# Patient Record
Sex: Male | Born: 1955 | Race: White | Hispanic: No | Marital: Single | Smoking: Never smoker
Health system: Southern US, Community
[De-identification: ages and names within clinical notes are randomized; demographics above are authoritative.]

## PROBLEM LIST (undated history)

## (undated) DIAGNOSIS — T148XXA Other injury of unspecified body region, initial encounter: Secondary | ICD-10-CM

## (undated) DIAGNOSIS — C439 Malignant melanoma of skin, unspecified: Secondary | ICD-10-CM

---

## 2006-12-18 ENCOUNTER — Emergency Department (HOSPITAL_COMMUNITY): Admission: EM | Admit: 2006-12-18 | Discharge: 2006-12-18 | Payer: Self-pay | Admitting: Emergency Medicine

## 2019-02-26 ENCOUNTER — Other Ambulatory Visit: Payer: Self-pay | Admitting: General Surgery

## 2019-02-26 DIAGNOSIS — R222 Localized swelling, mass and lump, trunk: Secondary | ICD-10-CM

## 2019-03-02 ENCOUNTER — Other Ambulatory Visit: Payer: Self-pay | Admitting: General Surgery

## 2019-03-02 DIAGNOSIS — C4359 Malignant melanoma of other part of trunk: Secondary | ICD-10-CM

## 2019-03-03 ENCOUNTER — Other Ambulatory Visit: Payer: Self-pay | Admitting: General Surgery

## 2019-03-03 ENCOUNTER — Telehealth: Payer: Self-pay | Admitting: *Deleted

## 2019-03-03 ENCOUNTER — Ambulatory Visit
Admission: RE | Admit: 2019-03-03 | Discharge: 2019-03-03 | Disposition: A | Discharge status: Home / Self Care | Payer: Medicare Other | Source: Ambulatory Visit | Attending: General Surgery | Admitting: General Surgery

## 2019-03-03 DIAGNOSIS — C4359 Malignant melanoma of other part of trunk: Secondary | ICD-10-CM

## 2019-03-03 DIAGNOSIS — R222 Localized swelling, mass and lump, trunk: Secondary | ICD-10-CM

## 2019-03-03 IMAGING — CT CT NECK WITH CONTRAST
1 series · 1 of 1 positions shown · IV contrast (iopamidol)
Comparison: CT chest abdomen and pelvis reported separately.

CLINICAL DATA: Melanoma. Bleeding from biopsy site.

EXAM:
CT NECK WITH CONTRAST
TECHNIQUE: Multidetector CT imaging of the neck was performed using the
standard protocol following the bolus administration of intravenous
contrast.
CONTRAST:  125mL [KD] IOPAMIDOL ([KD]) INJECTION 61%

[Series 3: topogram 0.70 tr20 sag · sagittal · 1.00mm/px · 1 of 1 slices shown]
[im 1/1]
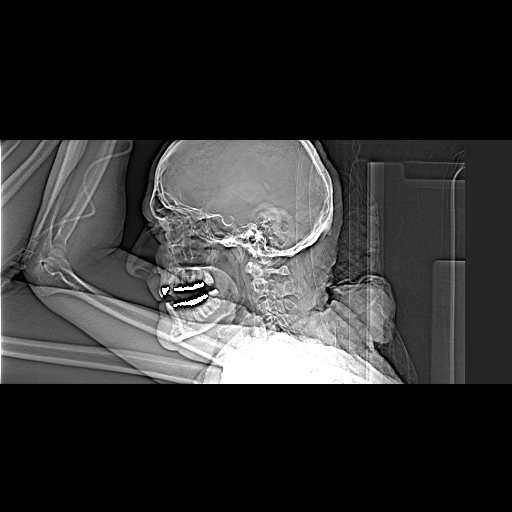

[1 of 1 positions shown; findings below may reference images not displayed]

FINDINGS: Pharynx and larynx: Normal. No mass or swelling.

Salivary glands: No inflammation, mass, or stone.

Thyroid: Normal.

Lymph nodes: None enlarged or abnormal density.

Vascular: Negative.

Limited intracranial: No visible intracranial metastatic deposits.

Visualized orbits: Negative.

Mastoids and visualized paranasal sinuses: Clear.

Skeleton: No acute or aggressive process.

Upper chest: Negative.

Other: There is a 96 x 48 x 57 mm (R-L x A-P x C-C) tumor with a
vascular pedicle, avidly enhancing, arising from the skin, midline
posterior neck, consistent with the stated diagnosis of melanoma.
IMPRESSION: 1. 96 x 48 x 57 mm tumor arising from the skin, midline posterior
neck consistent with the stated diagnosis of melanoma.
2. No visible regional adenopathy or intracranial metastatic
disease.

## 2019-03-03 IMAGING — CT CT NECK WITH CONTRAST
4 series · 16 of 33 positions shown, 18 images · IV contrast (iopamidol)
Comparison: CT chest abdomen and pelvis reported separately.

CLINICAL DATA: Melanoma. Bleeding from biopsy site.

EXAM:
CT NECK WITH CONTRAST
TECHNIQUE: Multidetector CT imaging of the neck was performed using the
standard protocol following the bolus administration of intravenous
contrast.
CONTRAST:  125mL [KD] IOPAMIDOL ([KD]) INJECTION 61%

[Series 6: neck 2.00 br40 s3 ax st · axial · 0.46mm/px · z∈[-617,-483]mm · 3 of 141 slices shown, 4 images]
[im 36/141  soft-tissue]
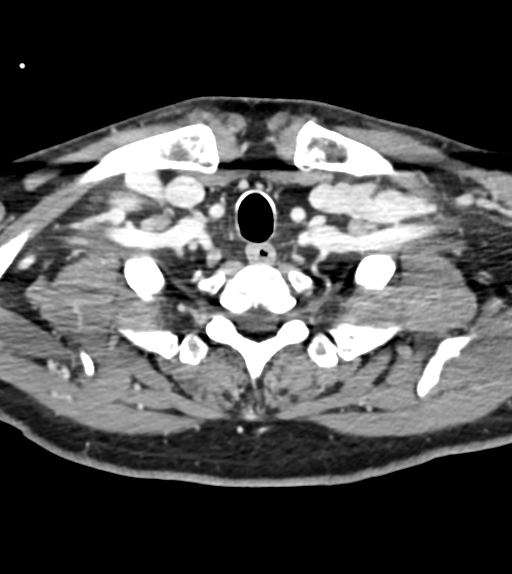
[im 36/141  bone]
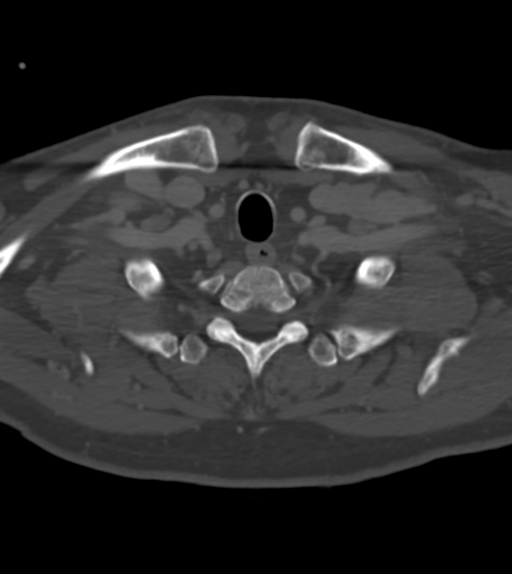
[im 71/141  bone]
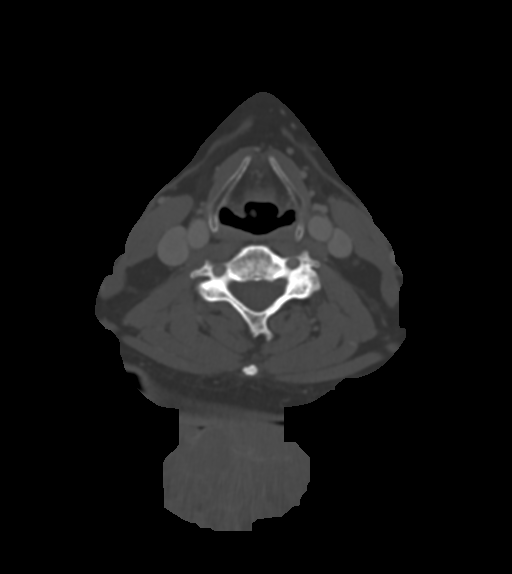
[im 106/141  bone]
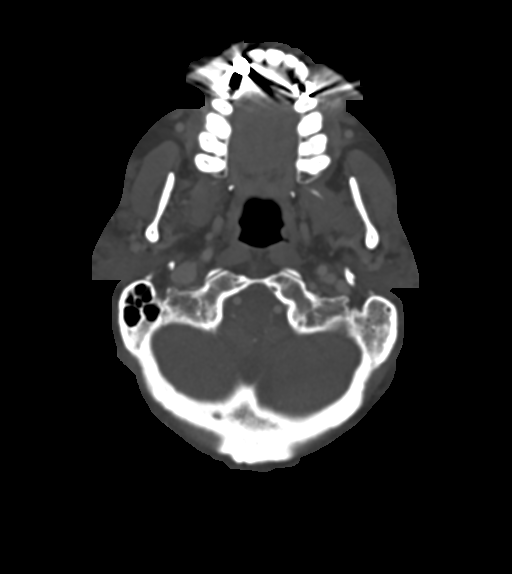

[Series 32: neck 2.00 br40 s3 cor st · coronal · 0.53mm/px · 3 of 131 slices shown]
[im 27/131  bone]
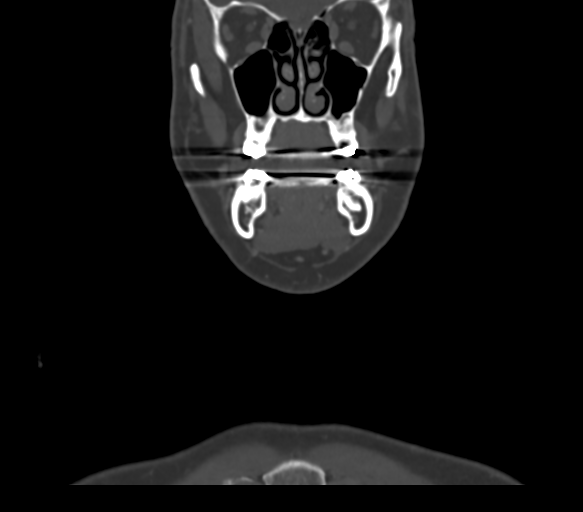
[im 53/131  bone]
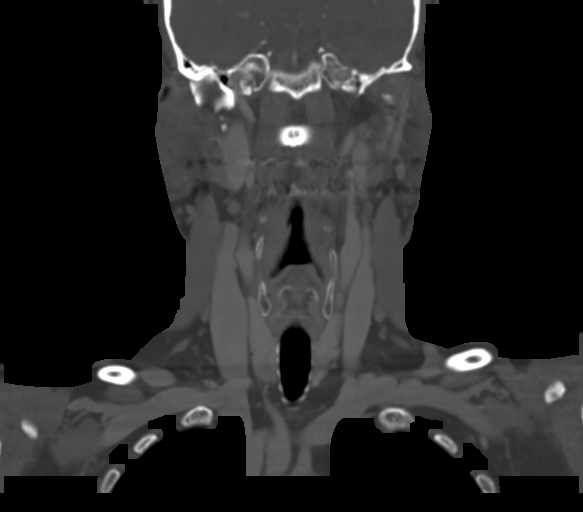
[im 79/131  bone]
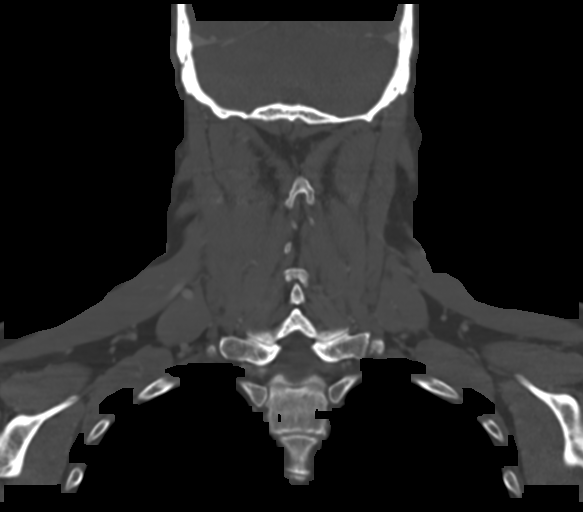

[Series 34: neck 2.00 br40 s3 sag st · sagittal · 0.51mm/px · 5 of 154 slices shown, 6 images]
[im 52/154  bone]
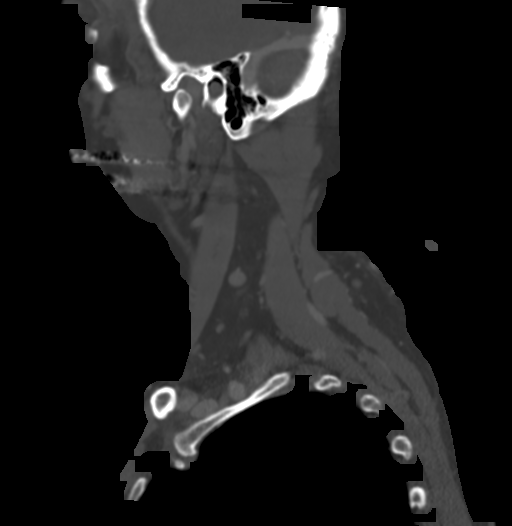
[im 64/154  bone]
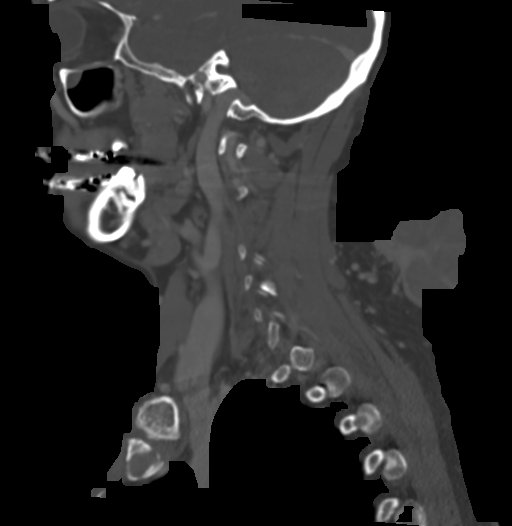
[im 77/154  soft-tissue]
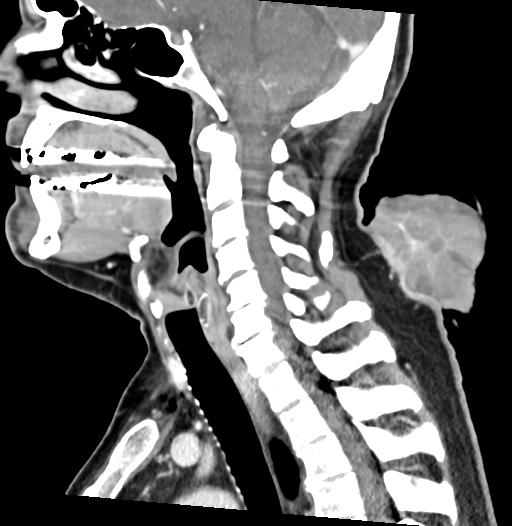
[im 77/154  bone]
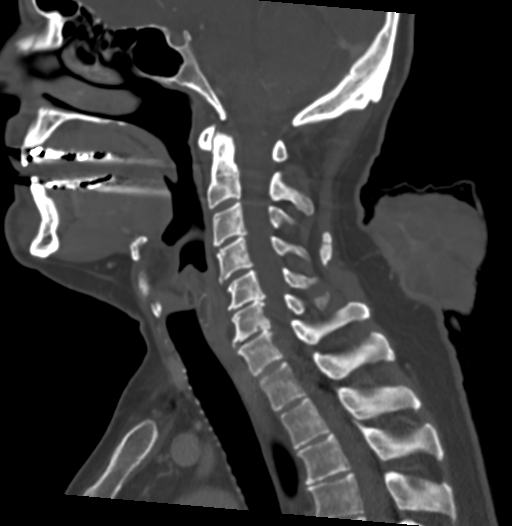
[im 90/154  bone]
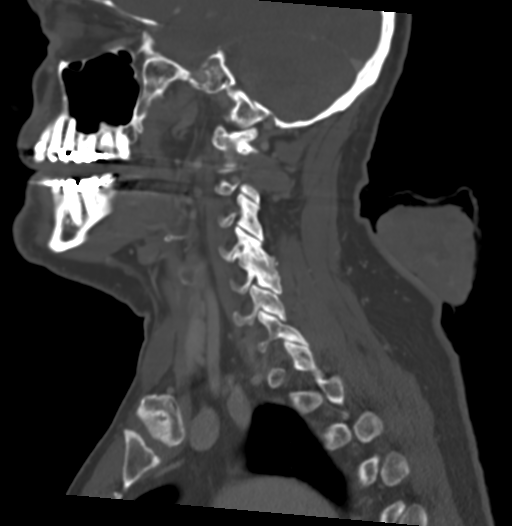
[im 103/154  bone]
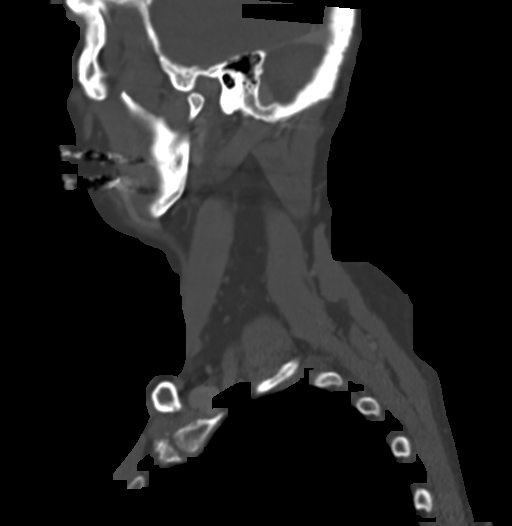

[Series 36: neck 2.00 br36 s3 (person_name) · axial · 0.53mm/px · z∈[-633,-509]mm · 5 of 254 slices shown]
[im 32/254  bone]
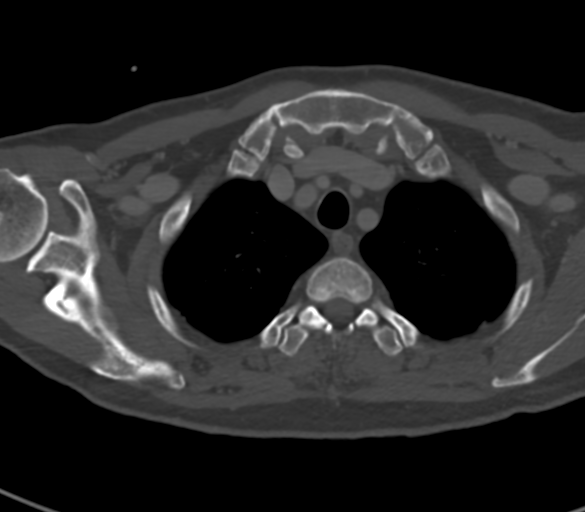
[im 64/254  bone]
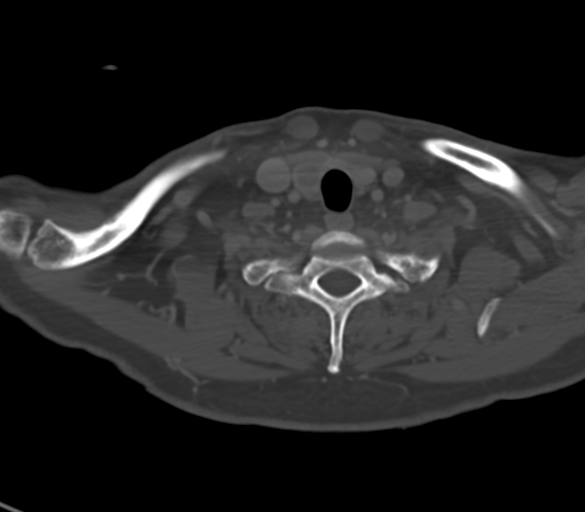
[im 95/254  bone]
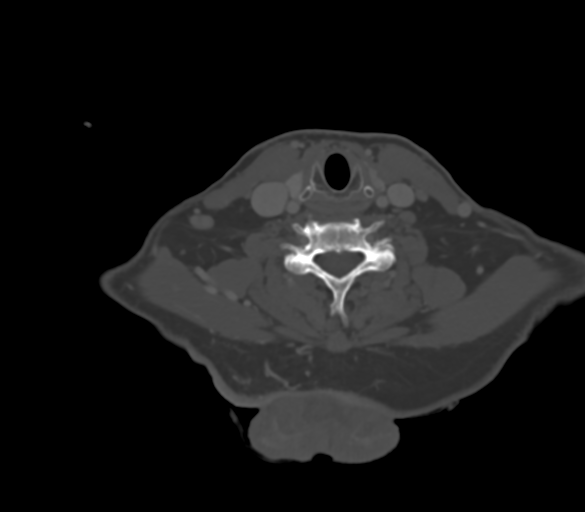
[im 127/254  bone]
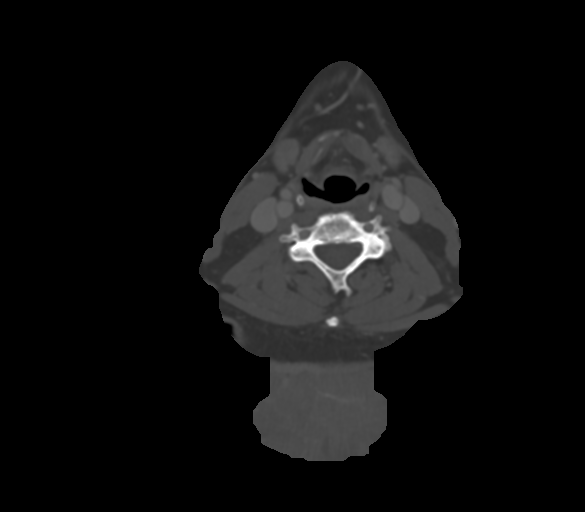
[im 159/254  bone]
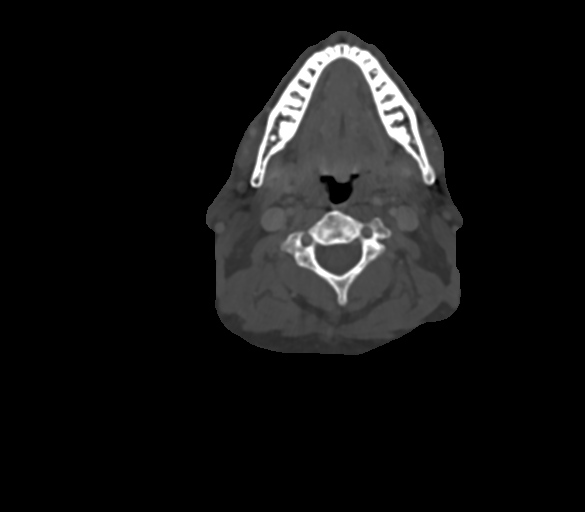

[16 of 33 positions shown; findings below may reference images not displayed]

FINDINGS: Pharynx and larynx: Normal. No mass or swelling.

Salivary glands: No inflammation, mass, or stone.

Thyroid: Normal.

Lymph nodes: None enlarged or abnormal density.

Vascular: Negative.

Limited intracranial: No visible intracranial metastatic deposits.

Visualized orbits: Negative.

Mastoids and visualized paranasal sinuses: Clear.

Skeleton: No acute or aggressive process.

Upper chest: Negative.

Other: There is a 96 x 48 x 57 mm (R-L x A-P x C-C) tumor with a
vascular pedicle, avidly enhancing, arising from the skin, midline
posterior neck, consistent with the stated diagnosis of melanoma.
IMPRESSION: 1. 96 x 48 x 57 mm tumor arising from the skin, midline posterior
neck consistent with the stated diagnosis of melanoma.
2. No visible regional adenopathy or intracranial metastatic
disease.

## 2019-03-03 IMAGING — CT CT NECK WITH CONTRAST
1 series · 12 of 14 positions shown, 15 images · IV contrast (iopamidol)
Comparison: CT chest abdomen and pelvis reported separately.

CLINICAL DATA: Melanoma. Bleeding from biopsy site.

EXAM:
CT NECK WITH CONTRAST
TECHNIQUE: Multidetector CT imaging of the neck was performed using the
standard protocol following the bolus administration of intravenous
contrast.
CONTRAST:  125mL [KD] IOPAMIDOL ([KD]) INJECTION 61%

[Series 4: neck 2.00 br60 s3 ax lung · axial · 0.48mm/px · z∈[-642,-580]mm · 12 of 37 slices shown, 15 images]
[im 3/37  soft-tissue]
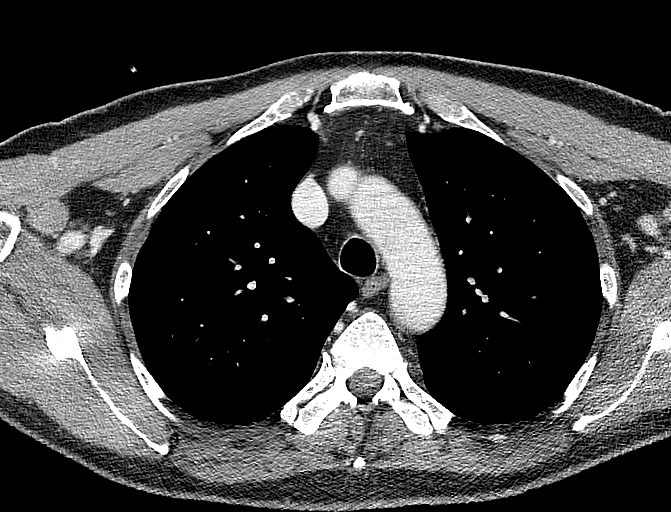
[im 3/37  bone]
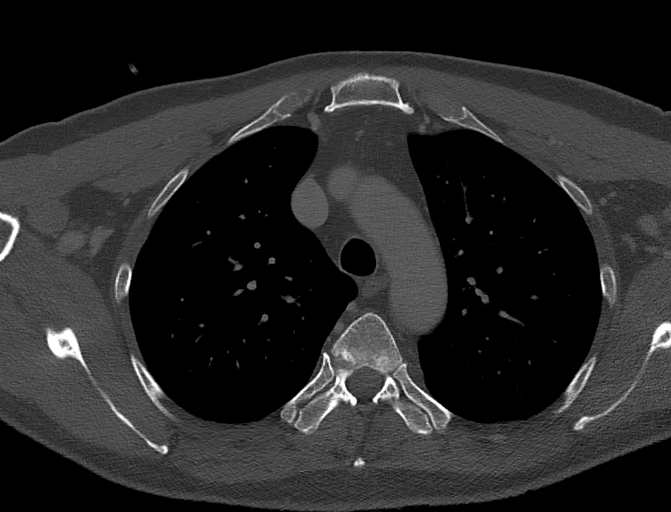
[im 6/37  bone]
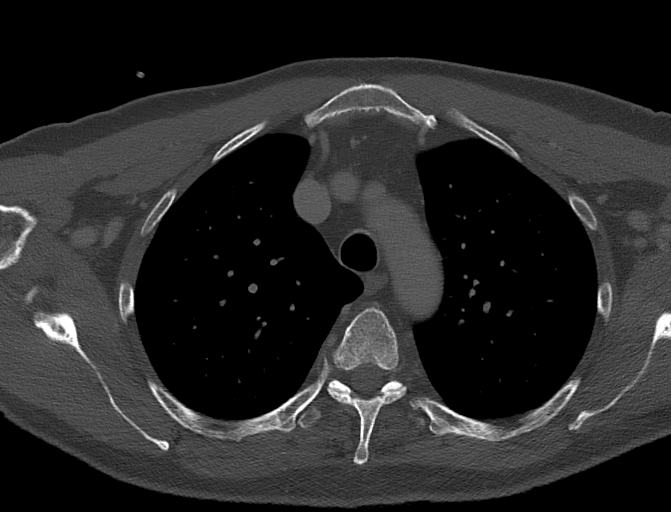
[im 9/37  bone]
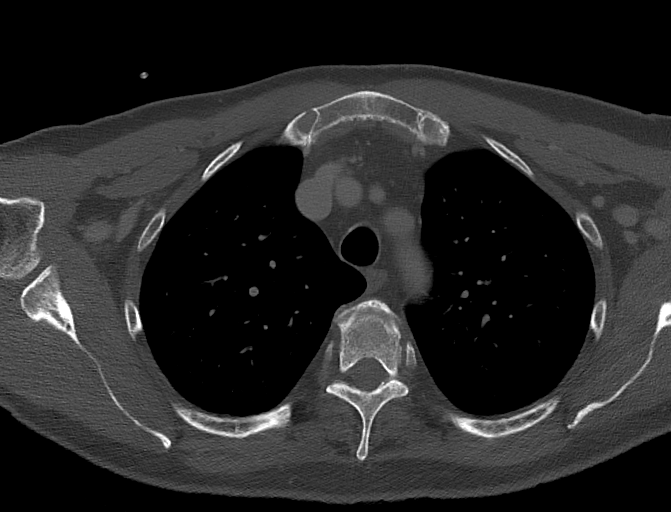
[im 12/37  bone]
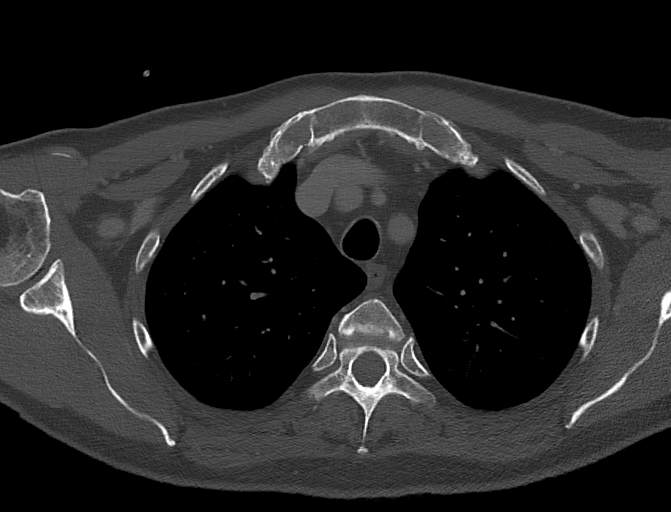
[im 14/37  soft-tissue]
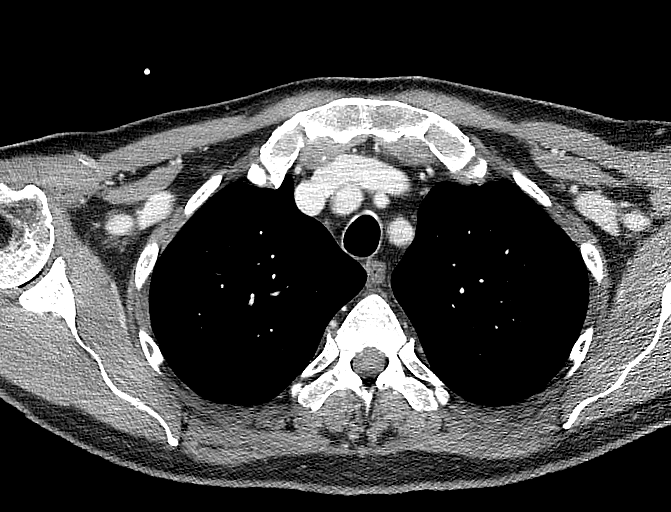
[im 14/37  bone]
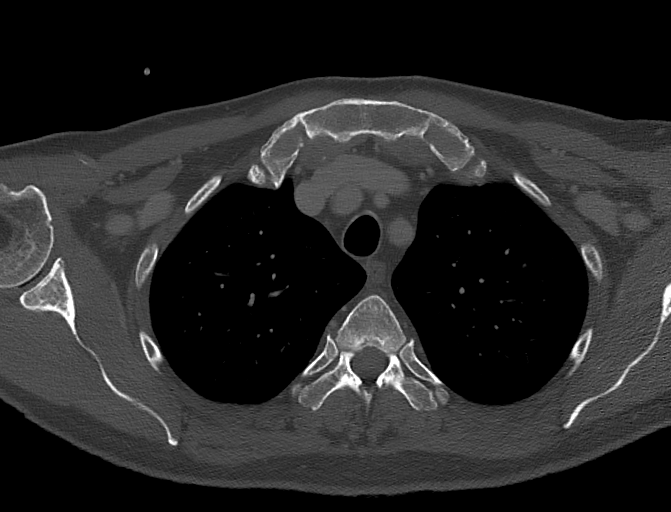
[im 17/37  bone]
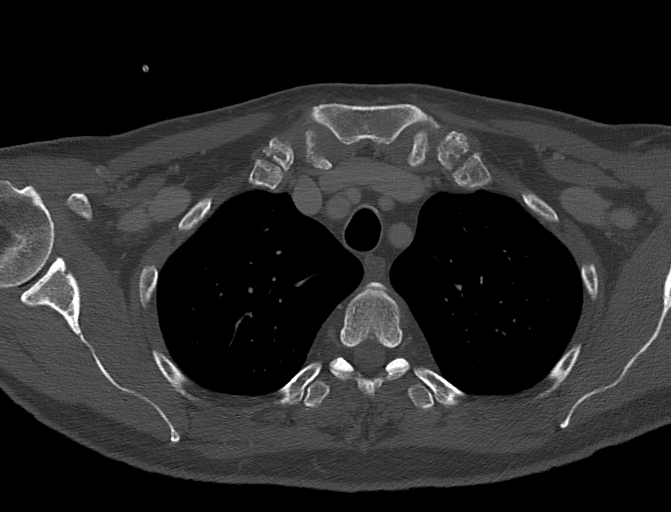
[im 20/37  bone]
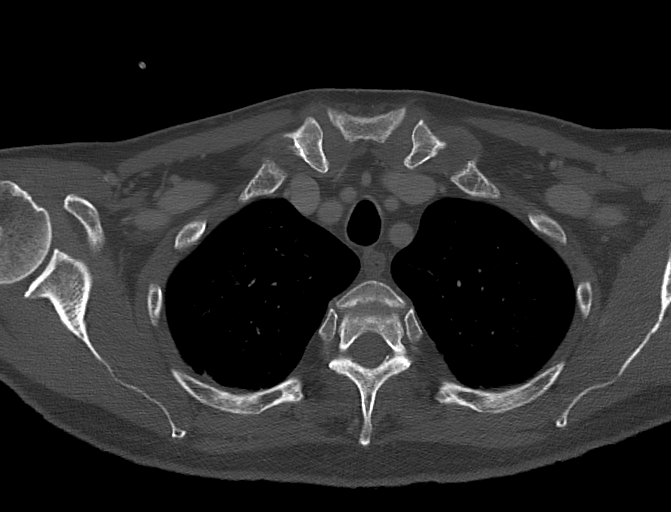
[im 23/37  bone]
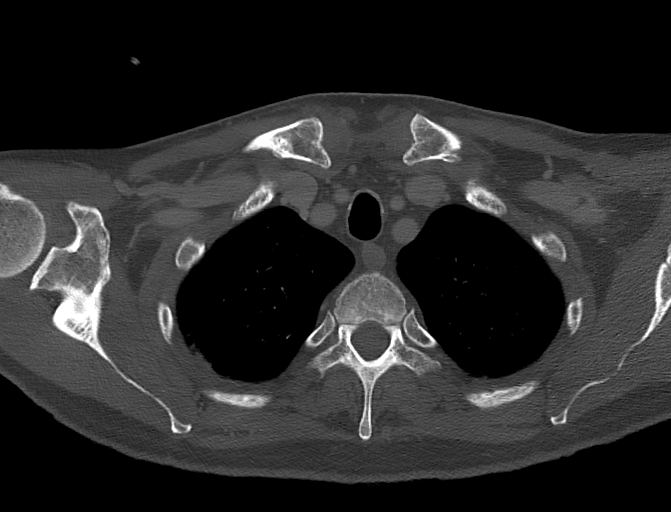
[im 25/37  soft-tissue]
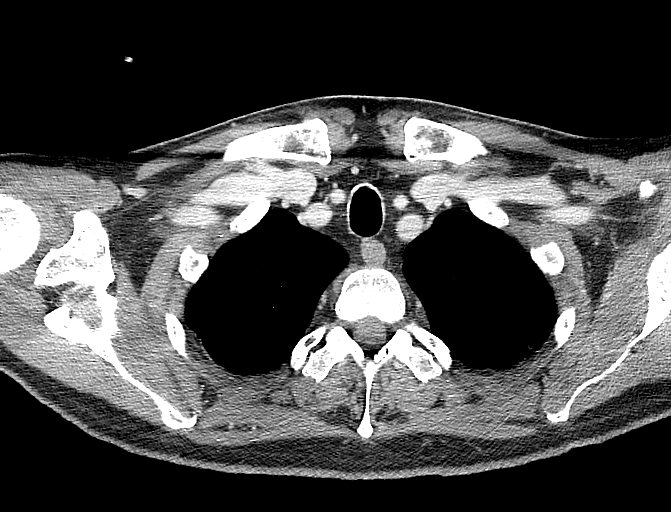
[im 25/37  bone]
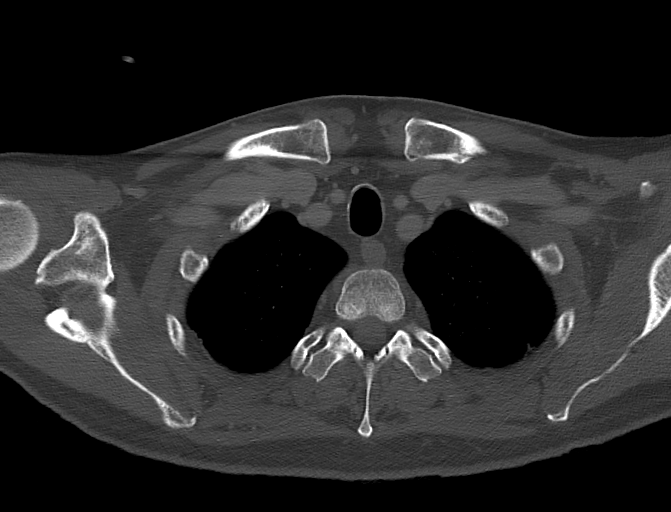
[im 28/37  bone]
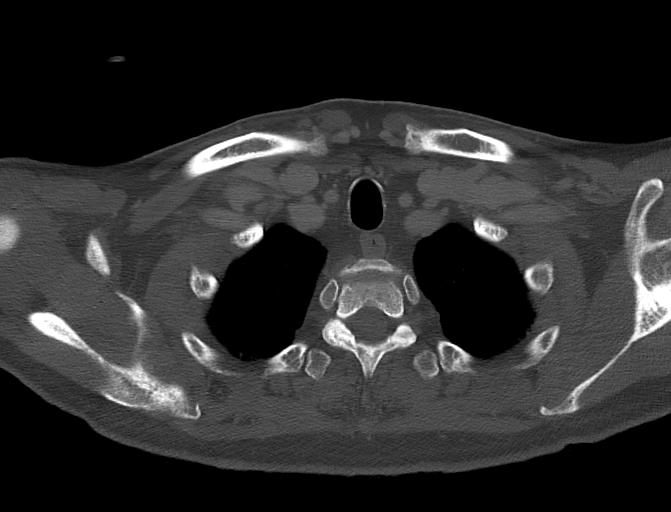
[im 31/37  bone]
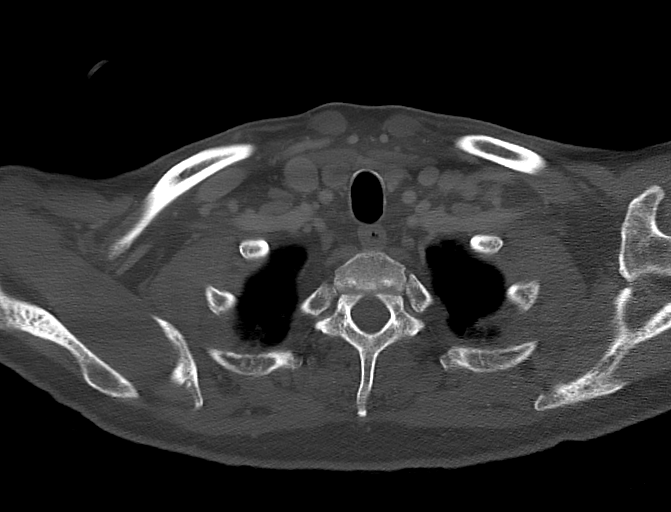
[im 34/37  bone]
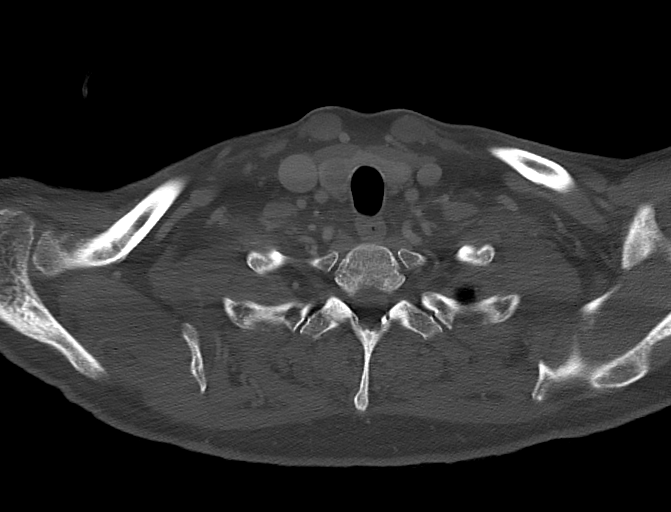

[12 of 14 positions shown; findings below may reference images not displayed]

FINDINGS: Pharynx and larynx: Normal. No mass or swelling.

Salivary glands: No inflammation, mass, or stone.

Thyroid: Normal.

Lymph nodes: None enlarged or abnormal density.

Vascular: Negative.

Limited intracranial: No visible intracranial metastatic deposits.

Visualized orbits: Negative.

Mastoids and visualized paranasal sinuses: Clear.

Skeleton: No acute or aggressive process.

Upper chest: Negative.

Other: There is a 96 x 48 x 57 mm (R-L x A-P x C-C) tumor with a
vascular pedicle, avidly enhancing, arising from the skin, midline
posterior neck, consistent with the stated diagnosis of melanoma.
IMPRESSION: 1. 96 x 48 x 57 mm tumor arising from the skin, midline posterior
neck consistent with the stated diagnosis of melanoma.
2. No visible regional adenopathy or intracranial metastatic
disease.

## 2019-03-03 IMAGING — CT CT ABDOMEN AND PELVIS WITHOUT AND WITH CONTRAST
2 of 8 series · 13 of 46 positions shown, 15 images · IV contrast (iopamidol)
Comparison: None.

CLINICAL DATA: Melanoma, back

EXAM:
CT CHEST, ABDOMEN, AND PELVIS WITH CONTRAST
TECHNIQUE: Multidetector CT imaging of the chest, abdomen and pelvis was
performed following the standard protocol during bolus
administration of intravenous contrast.
CONTRAST:  125mL [N4] IOPAMIDOL ([N4]) INJECTION 61%,
additional oral enteric contrast

[Series 8: arterial phase liver 2.00 br40 s3 cor · coronal · arterial · 0.45mm/px · 3 of 170 slices shown]
[im 43/170  soft-tissue]
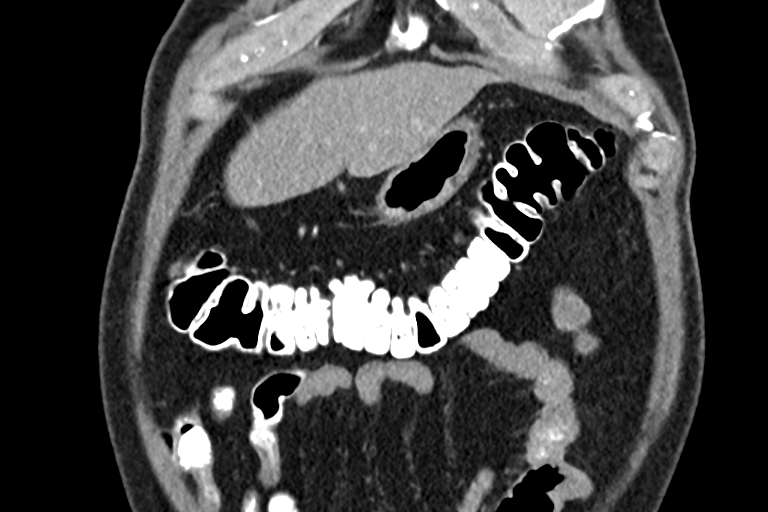
[im 85/170  soft-tissue]
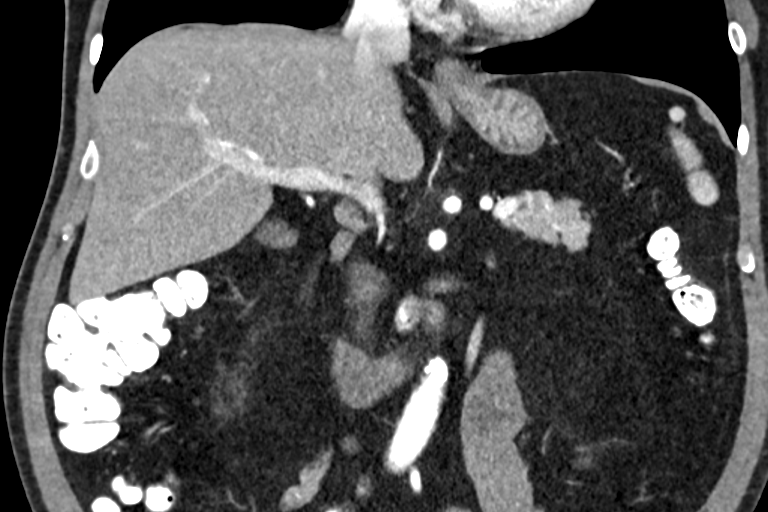
[im 127/170  soft-tissue]
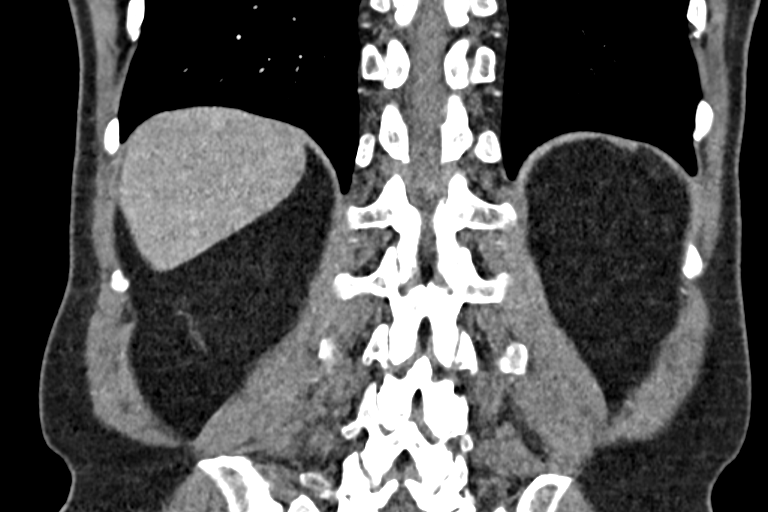

[Series 17: venous phase liver 2.00 br40 s3 ax · axial · portal-venous · 0.66mm/px · z∈[-1175,-587]mm · 10 of 330 slices shown, 12 images]
[im 18/330  soft-tissue]
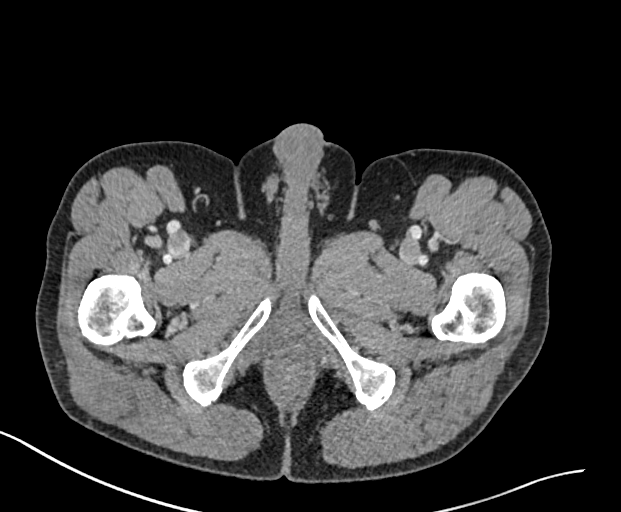
[im 18/330  bone]
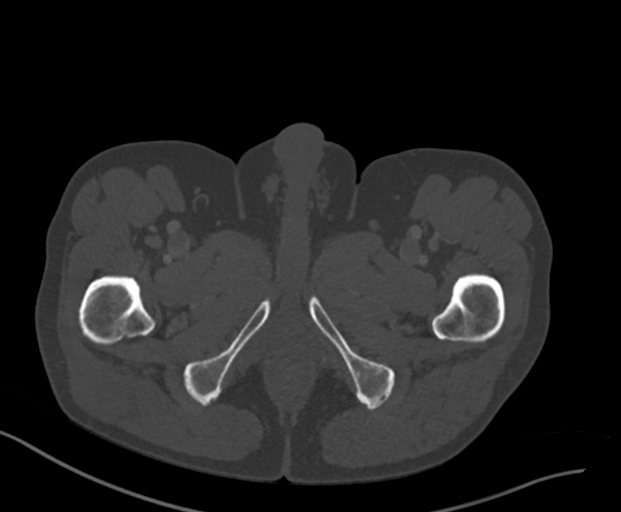
[im 52/330  soft-tissue]
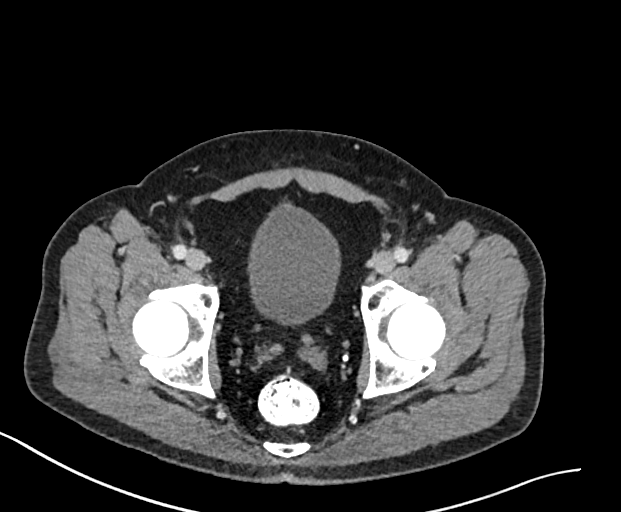
[im 87/330  soft-tissue]
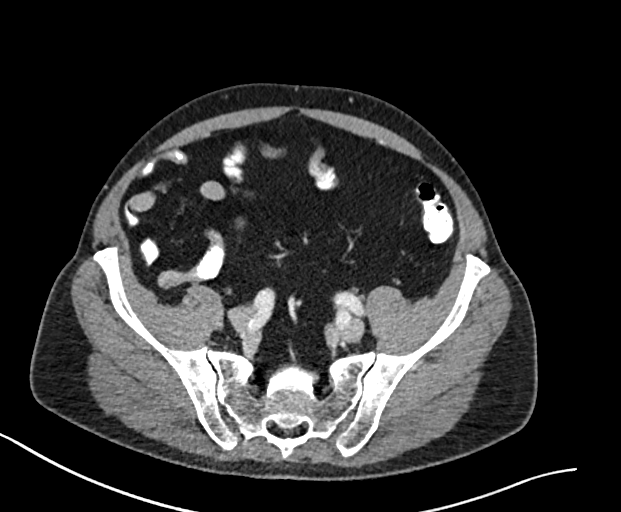
[im 122/330  soft-tissue]
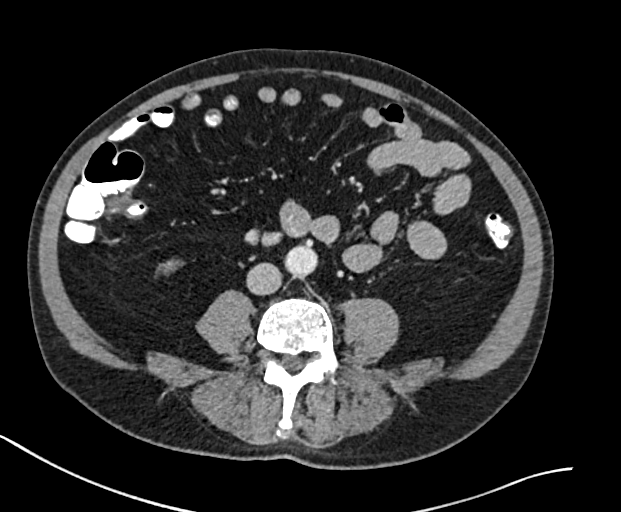
[im 156/330  soft-tissue]
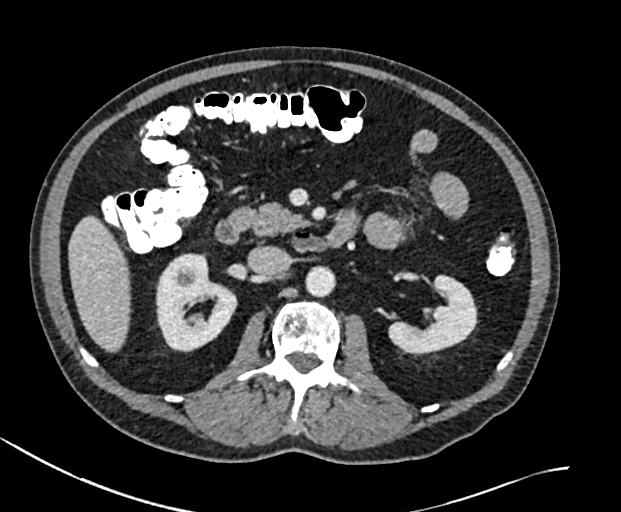
[im 174/330  soft-tissue]
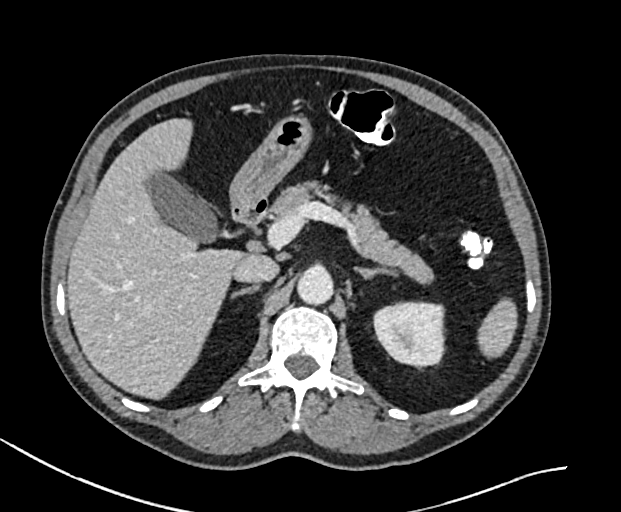
[im 208/330  soft-tissue]
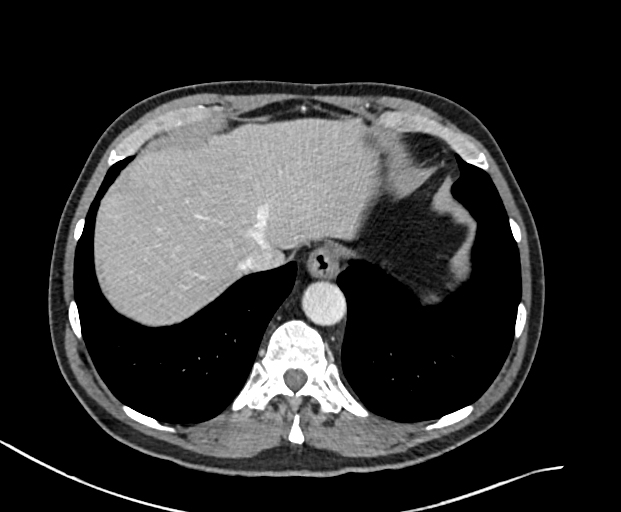
[im 243/330  soft-tissue]
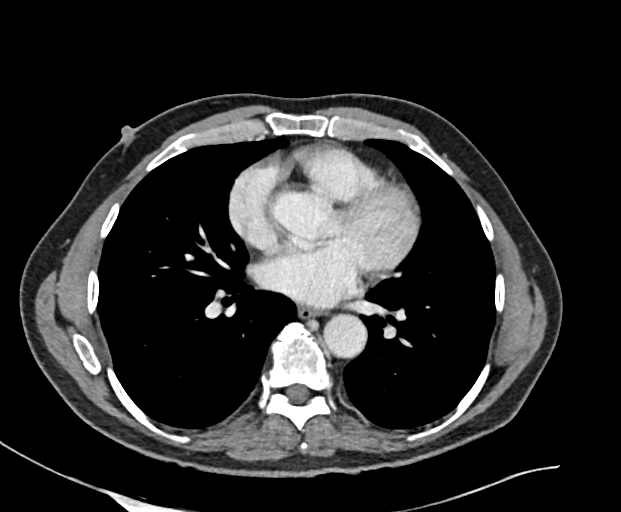
[im 278/330  soft-tissue]
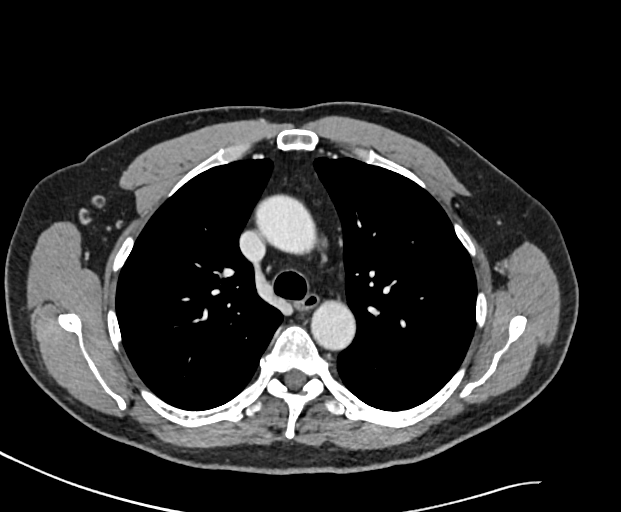
[im 278/330  bone]
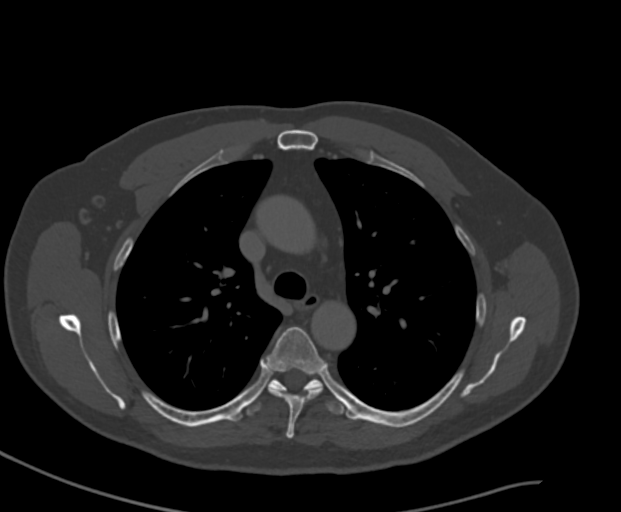
[im 312/330  soft-tissue]
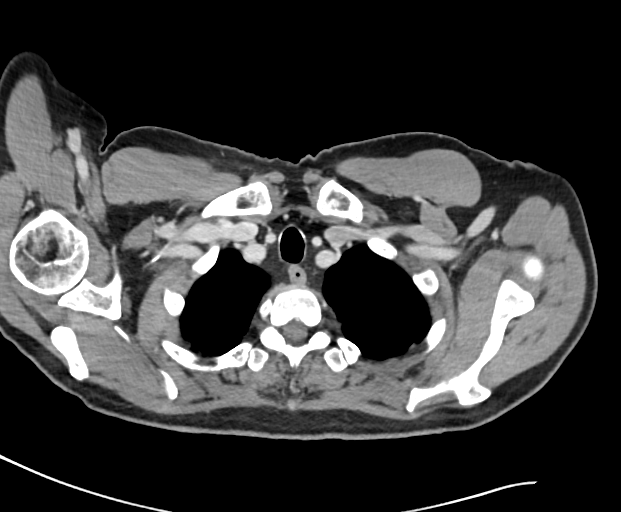

[13 of 46 positions shown; findings below may reference images not displayed]

FINDINGS: CT CHEST FINDINGS

Cardiovascular: No significant vascular findings. Normal heart size.
No pericardial effusion.

Mediastinum/Nodes: No enlarged mediastinal, hilar, or axillary lymph
nodes. Thyroid gland, trachea, and esophagus demonstrate no
significant findings.

Lungs/Pleura: Lungs are clear. No pleural effusion or pneumothorax.

Musculoskeletal: No chest wall mass or suspicious bone lesions
identified.

CT ABDOMEN PELVIS FINDINGS

Hepatobiliary: No focal liver abnormality is seen. No gallstones,
gallbladder wall thickening, or biliary dilatation.

Pancreas: Unremarkable. No pancreatic ductal dilatation or
surrounding inflammatory changes.

Spleen: Normal in size without focal abnormality.

Adrenals/Urinary Tract: Adrenal glands are unremarkable. Kidneys are
normal, without renal calculi, focal lesion, or hydronephrosis.
Bladder is unremarkable.

Stomach/Bowel: Stomach is within normal limits. Appendix appears
normal. No evidence of bowel wall thickening, distention, or
inflammatory changes.

Vascular/Lymphatic: No significant vascular findings are present. No
enlarged abdominal or pelvic lymph nodes.

Reproductive: No mass or other abnormality.

Other: No abdominal wall hernia or abnormality. No abdominopelvic
ascites.

Musculoskeletal: No acute or significant osseous findings.
IMPRESSION: No CT evidence of metastatic disease in the chest, abdomen, or
pelvis.

## 2019-03-03 IMAGING — CT CT ABDOMEN AND PELVIS WITHOUT AND WITH CONTRAST
2 series · 13 of 36 positions shown, 17 images · IV contrast (iopamidol)
Comparison: None.

CLINICAL DATA: Melanoma, back

EXAM:
CT CHEST, ABDOMEN, AND PELVIS WITH CONTRAST
TECHNIQUE: Multidetector CT imaging of the chest, abdomen and pelvis was
performed following the standard protocol during bolus
administration of intravenous contrast.
CONTRAST:  125mL [N4] IOPAMIDOL ([N4]) INJECTION 61%,
additional oral enteric contrast

[Series 4: venous phase liver 2.00 br40 s3 sag · sagittal · portal-venous · 0.66mm/px · 1 of 204 slices shown, 2 images]
[im 68/204  soft-tissue]
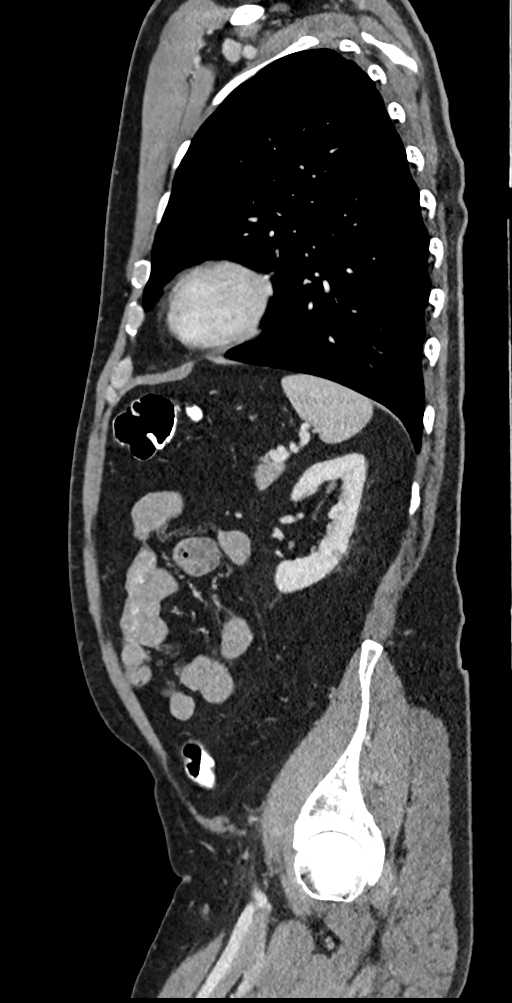
[im 68/204  bone]
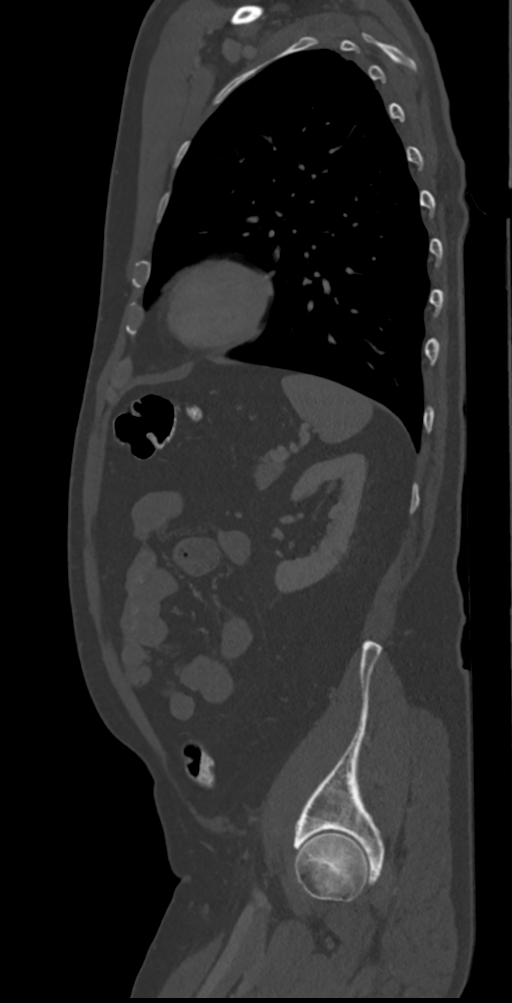

[Series 5: liver /kidney delay 2.00 br40 s3 ax delay · axial · delayed · 0.65mm/px · z∈[-956,-780]mm · 12 of 99 slices shown, 15 images]
[im 7/99  soft-tissue]
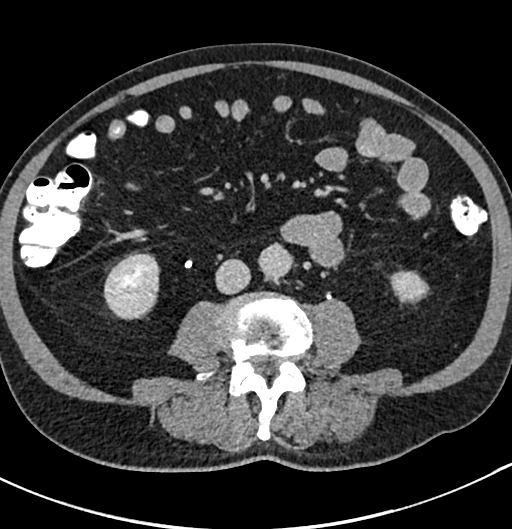
[im 7/99  bone]
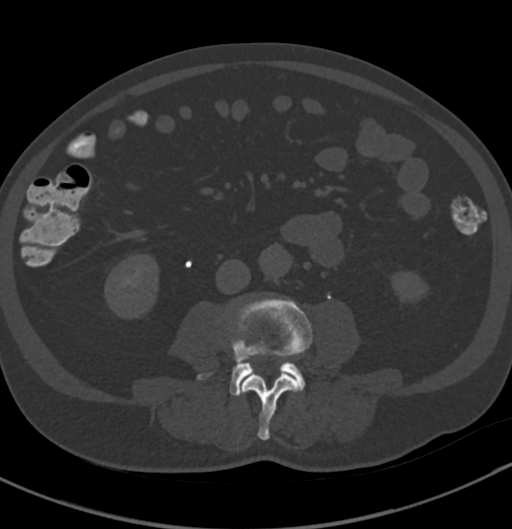
[im 19/99  soft-tissue]
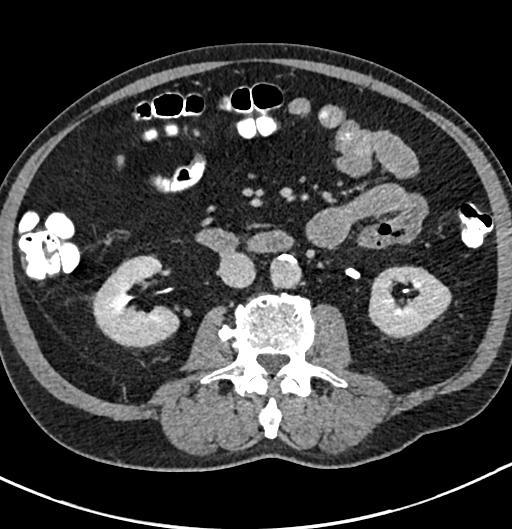
[im 29/99  soft-tissue]
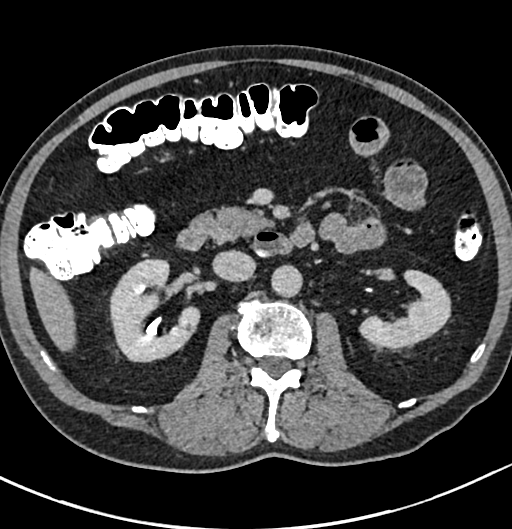
[im 38/99  soft-tissue]
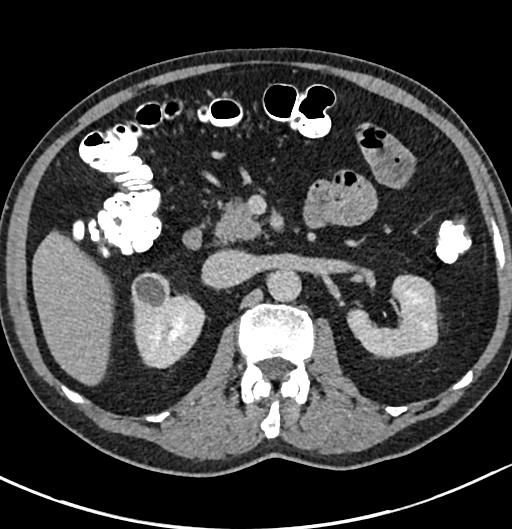
[im 51/99  soft-tissue]
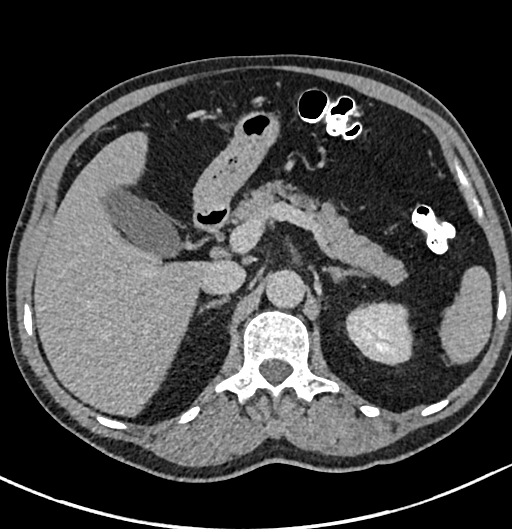
[im 61/99  soft-tissue]
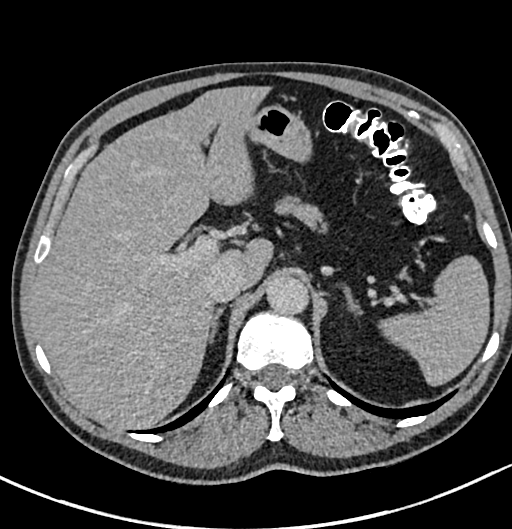
[im 70/99  soft-tissue]
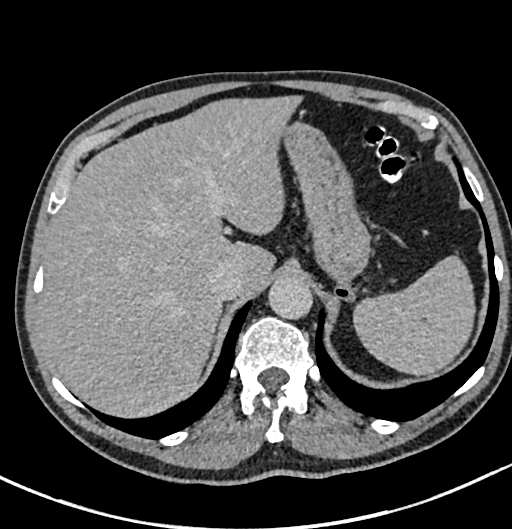
[im 83/99  soft-tissue]
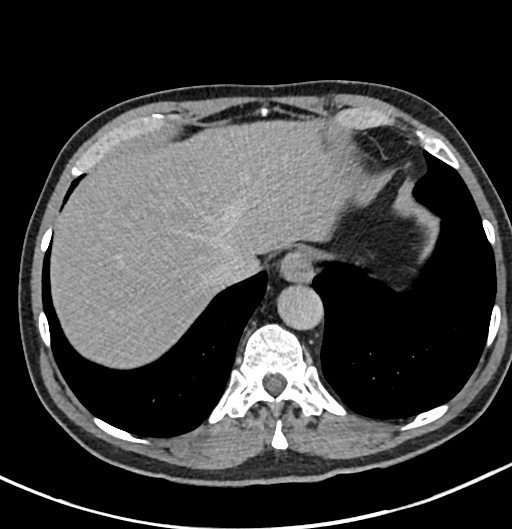
[im 86/99  lung]
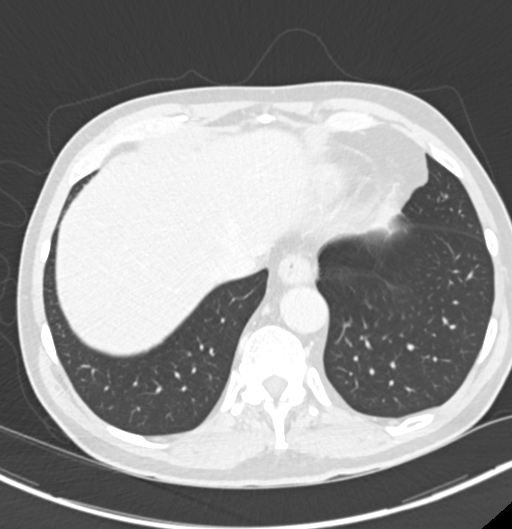
[im 89/99  lung]
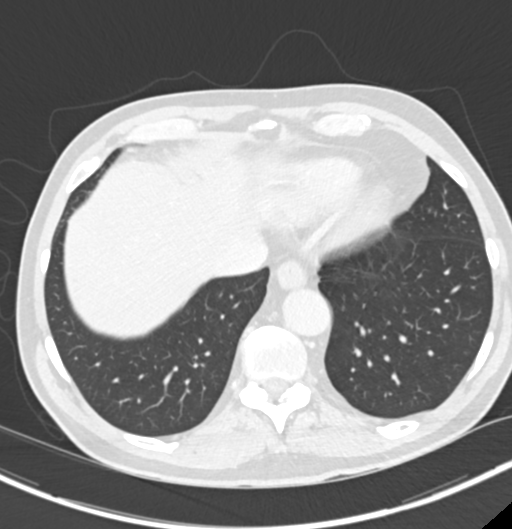
[im 92/99  soft-tissue]
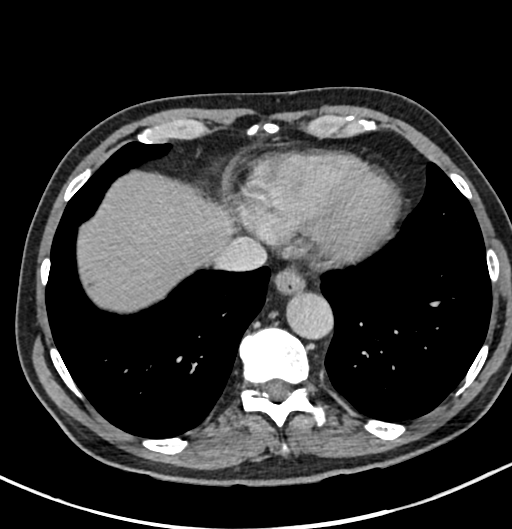
[im 92/99  lung]
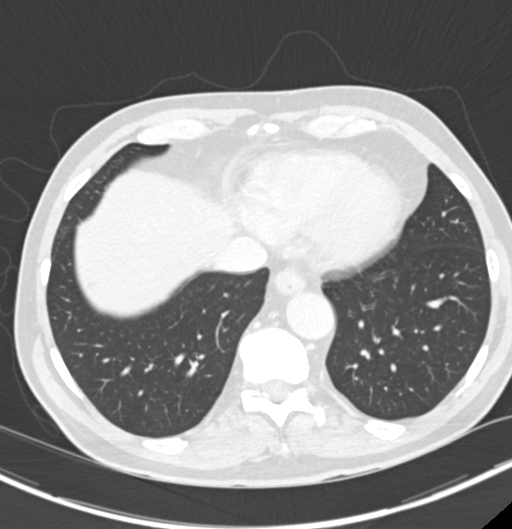
[im 92/99  bone]
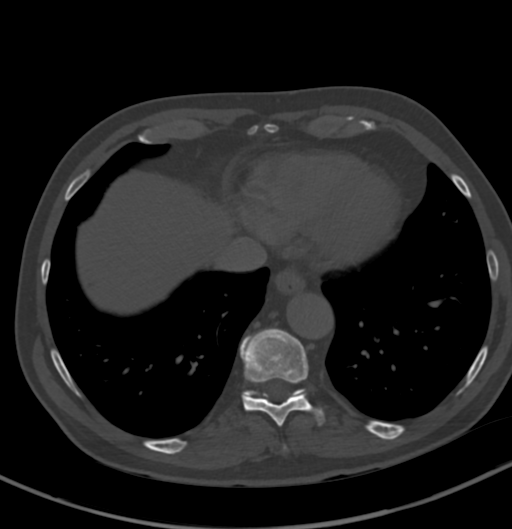
[im 95/99  lung]
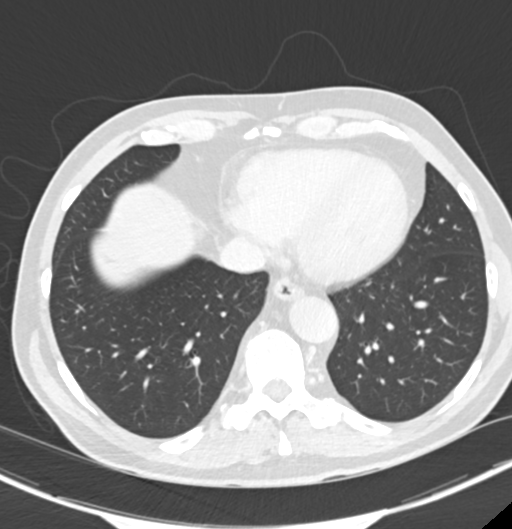

[13 of 36 positions shown; findings below may reference images not displayed]

FINDINGS: CT CHEST FINDINGS

Cardiovascular: No significant vascular findings. Normal heart size.
No pericardial effusion.

Mediastinum/Nodes: No enlarged mediastinal, hilar, or axillary lymph
nodes. Thyroid gland, trachea, and esophagus demonstrate no
significant findings.

Lungs/Pleura: Lungs are clear. No pleural effusion or pneumothorax.

Musculoskeletal: No chest wall mass or suspicious bone lesions
identified.

CT ABDOMEN PELVIS FINDINGS

Hepatobiliary: No focal liver abnormality is seen. No gallstones,
gallbladder wall thickening, or biliary dilatation.

Pancreas: Unremarkable. No pancreatic ductal dilatation or
surrounding inflammatory changes.

Spleen: Normal in size without focal abnormality.

Adrenals/Urinary Tract: Adrenal glands are unremarkable. Kidneys are
normal, without renal calculi, focal lesion, or hydronephrosis.
Bladder is unremarkable.

Stomach/Bowel: Stomach is within normal limits. Appendix appears
normal. No evidence of bowel wall thickening, distention, or
inflammatory changes.

Vascular/Lymphatic: No significant vascular findings are present. No
enlarged abdominal or pelvic lymph nodes.

Reproductive: No mass or other abnormality.

Other: No abdominal wall hernia or abnormality. No abdominopelvic
ascites.

Musculoskeletal: No acute or significant osseous findings.
IMPRESSION: No CT evidence of metastatic disease in the chest, abdomen, or
pelvis.

## 2019-03-03 MED ORDER — IOPAMIDOL (ISOVUE-300) INJECTION 61%
125.0000 mL | Freq: Once | INTRAVENOUS | Status: AC | PRN
  Administered 2019-03-03: 125 mL via INTRAVENOUS

## 2019-03-03 NOTE — Telephone Encounter (Signed)
No note

## 2019-03-04 ENCOUNTER — Other Ambulatory Visit: Payer: Medicare Other

## 2019-03-06 ENCOUNTER — Telehealth: Payer: Self-pay | Admitting: Oncology

## 2019-03-06 NOTE — Telephone Encounter (Signed)
A new patient appt has been scheduled for the pt to see Dr. Alen Blew on 4/3 at 11am. Pt aware to arrive 30 minutes early.

## 2019-03-12 ENCOUNTER — Encounter (HOSPITAL_COMMUNITY)
Admission: RE | Admit: 2019-03-12 | Discharge: 2019-03-12 | Disposition: A | Discharge status: Home / Self Care | Payer: Medicare Other | Source: Ambulatory Visit | Attending: General Surgery | Admitting: General Surgery

## 2019-03-12 ENCOUNTER — Other Ambulatory Visit: Payer: Self-pay | Admitting: General Surgery

## 2019-03-12 ENCOUNTER — Other Ambulatory Visit: Payer: Self-pay

## 2019-03-12 DIAGNOSIS — C4359 Malignant melanoma of other part of trunk: Secondary | ICD-10-CM | POA: Insufficient documentation

## 2019-03-12 MED ORDER — TECHNETIUM TC 99M SULFUR COLLOID FILTERED
0.5000 | Freq: Once | INTRAVENOUS | Status: AC | PRN
  Administered 2019-03-12: 0.5 via INTRADERMAL

## 2019-03-13 ENCOUNTER — Inpatient Hospital Stay: Payer: Medicare Other | Attending: Oncology | Admitting: Oncology

## 2019-03-13 ENCOUNTER — Other Ambulatory Visit: Payer: Self-pay

## 2019-03-13 ENCOUNTER — Other Ambulatory Visit: Payer: Self-pay | Admitting: General Surgery

## 2019-03-13 VITALS — BP 122/81 | HR 81 | Temp 98.4°F | Resp 18 | Ht 68.0 in | Wt 172.0 lb

## 2019-03-13 DIAGNOSIS — C439 Malignant melanoma of skin, unspecified: Secondary | ICD-10-CM

## 2019-03-13 DIAGNOSIS — C434 Malignant melanoma of scalp and neck: Secondary | ICD-10-CM | POA: Diagnosis not present

## 2019-03-13 DIAGNOSIS — C4359 Malignant melanoma of other part of trunk: Secondary | ICD-10-CM

## 2019-03-13 NOTE — Progress Notes (Signed)
Reason for the request:    Melanoma  HPI: I was asked by Dr. Barry Dienes to evaluate Walter Rodriguez for new diagnosis of melanoma.  He is a 63 year old man was evaluated by Dr. Dalbert Batman initially for a large necrotic tumor on his back in March 2020.  Current to the patient it was noted dating back to 2015.  This was biopsy-proven to be malignant melanoma.  He was subsequently evaluated by Dr. Barry Dienes for surgical resection and underwent staging work-up including a CT scan of the chest and the abdomen as well as the pelvis completed on March 03, 2019.  Imaging studies at that time showed no evidence of metastatic disease noted.  CT scan of the neck showed a 96 x 48 x 57 tumor arising from the skin the posterior neck consistent with melanoma.  No visible lymphadenopathy noted.  Nuclear lymphangiography obtained on March 12, 2019 showed no clear lymph node mapping with lymph node channel tracks towards the right axilla. He was also evaluated by Dr.Thimmappa from plastic surgery for possible flap closure after his surgical resection.  Medically, he reports no recent complaints.  He does report some occasional bleeding associated with his tumor.  He denies any recent hospitalization or illnesses.  He denies any shortness of breath or difficulty breathing.  He remains in excellent health and performance status.   He does not report any headaches, blurry vision, syncope or seizures. Does not report any fevers, chills or sweats.  Does not report any cough, wheezing or hemoptysis.  Does not report any chest pain, palpitation, orthopnea or leg edema.  Does not report any nausea, vomiting or abdominal pain.  Does not report any constipation or diarrhea.  Does not report any skeletal complaints.    Does not report frequency, urgency or hematuria.  Does not report any skin rashes or lesions. Does not report any heat or cold intolerance.  Does not report any lymphadenopathy or petechiae.  Does not report any anxiety or depression.   Remaining review of systems is negative.    He denies any past medical history for hypertension, diabetes or coronary disease.      He takes no medication at this time.  Allergies none at this time.  No family history of any malignant disorder or melanoma.  Social History   Socioeconomic History  . Marital status: Single    Spouse name: Not on file  . Number of children: Not on file  . Years of education: Not on file  . Highest education level: Not on file  Occupational History  . Not on file  Social Needs  . Financial resource strain: Not on file  . Food insecurity:    Worry: Not on file    Inability: Not on file  . Transportation needs:    Medical: Not on file    Non-medical: Not on file  Tobacco Use  . Smoking status: Not on file  Substance and Sexual Activity  . Alcohol use: Not on file  . Drug use: Not on file  . Sexual activity: Not on file  Lifestyle  . Physical activity:    Days per week: Not on file    Minutes per session: Not on file  . Stress: Not on file  Relationships  . Social connections:    Talks on phone: Not on file    Gets together: Not on file    Attends religious service: Not on file    Active member of club or organization: Not on file  Attends meetings of clubs or organizations: Not on file    Relationship status: Not on file  . Intimate partner violence:    Fear of current or ex partner: Not on file    Emotionally abused: Not on file    Physically abused: Not on file    Forced sexual activity: Not on file  Other Topics Concern  . Not on file  Social History Narrative  . Not on file  :  Pertinent items are noted in HPI. Blood pressure 122/81, pulse 81, temperature 98.4 F (36.9 C), temperature source Oral, resp. rate 18, height 5\' 8"  (1.727 m), weight 172 lb (78 kg), SpO2 100 %.   Exam: ECOG 0  General appearance: alert and cooperative appeared without distress. Head: atraumatic without any abnormalities. Eyes:  conjunctivae/corneas clear. PERRL.  Sclera anicteric. Throat: lips, mucosa, and tongue normal; without oral thrush or ulcers. Resp: clear to auscultation bilaterally without rhonchi, wheezes or dullness to percussion. Cardio: regular rate and rhythm, S1, S2 normal, no murmur, click, rub or gallop GI: soft, non-tender; bowel sounds normal; no masses,  no organomegaly Skin: Large tumor protruding the posterior aspect of his neck. Lymph nodes: Cervical, supraclavicular, and axillary nodes normal.  Neurologic: Grossly normal without any motor, sensory or deep tendon reflexes. Musculoskeletal: No joint deformity or effusion.     Ct Abdomen Pelvis W Wo Contrast  Result Date: 03/03/2019 CLINICAL DATA:  Melanoma, back EXAM: CT CHEST, ABDOMEN, AND PELVIS WITH CONTRAST TECHNIQUE: Multidetector CT imaging of the chest, abdomen and pelvis was performed following the standard protocol during bolus administration of intravenous contrast. CONTRAST:  134mL ISOVUE-300 IOPAMIDOL (ISOVUE-300) INJECTION 61%, additional oral enteric contrast COMPARISON:  None. FINDINGS: CT CHEST FINDINGS Cardiovascular: No significant vascular findings. Normal heart size. No pericardial effusion. Mediastinum/Nodes: No enlarged mediastinal, hilar, or axillary lymph nodes. Thyroid gland, trachea, and esophagus demonstrate no significant findings. Lungs/Pleura: Lungs are clear. No pleural effusion or pneumothorax. Musculoskeletal: No chest wall mass or suspicious bone lesions identified. CT ABDOMEN PELVIS FINDINGS Hepatobiliary: No focal liver abnormality is seen. No gallstones, gallbladder wall thickening, or biliary dilatation. Pancreas: Unremarkable. No pancreatic ductal dilatation or surrounding inflammatory changes. Spleen: Normal in size without focal abnormality. Adrenals/Urinary Tract: Adrenal glands are unremarkable. Kidneys are normal, without renal calculi, focal lesion, or hydronephrosis. Bladder is unremarkable. Stomach/Bowel:  Stomach is within normal limits. Appendix appears normal. No evidence of bowel wall thickening, distention, or inflammatory changes. Vascular/Lymphatic: No significant vascular findings are present. No enlarged abdominal or pelvic lymph nodes. Reproductive: No mass or other abnormality. Other: No abdominal wall hernia or abnormality. No abdominopelvic ascites. Musculoskeletal: No acute or significant osseous findings. IMPRESSION: No CT evidence of metastatic disease in the chest, abdomen, or pelvis. Electronically Signed   By: Eddie Candle M.D.   On: 03/03/2019 14:37   Ct Soft Tissue Neck W Contrast  Result Date: 03/03/2019 CLINICAL DATA:  Melanoma. Bleeding from biopsy site. EXAM: CT NECK WITH CONTRAST TECHNIQUE: Multidetector CT imaging of the neck was performed using the standard protocol following the bolus administration of intravenous contrast. CONTRAST:  150mL ISOVUE-300 IOPAMIDOL (ISOVUE-300) INJECTION 61% COMPARISON:  CT chest abdomen and pelvis reported separately. FINDINGS: Pharynx and larynx: Normal. No mass or swelling. Salivary glands: No inflammation, mass, or stone. Thyroid: Normal. Lymph nodes: None enlarged or abnormal density. Vascular: Negative. Limited intracranial: No visible intracranial metastatic deposits. Visualized orbits: Negative. Mastoids and visualized paranasal sinuses: Clear. Skeleton: No acute or aggressive process. Upper chest: Negative. Other: There is a 96  x 48 x 57 mm (R-L x A-P x C-C) tumor with a vascular pedicle, avidly enhancing, arising from the skin, midline posterior neck, consistent with the stated diagnosis of melanoma. IMPRESSION: 1. 96 x 48 x 57 mm tumor arising from the skin, midline posterior neck consistent with the stated diagnosis of melanoma. 2. No visible regional adenopathy or intracranial metastatic disease. Electronically Signed   By: Staci Righter M.D.   On: 03/03/2019 15:45   Ct Chest W Contrast  Result Date: 03/03/2019 CLINICAL DATA:  Melanoma,  back EXAM: CT CHEST, ABDOMEN, AND PELVIS WITH CONTRAST TECHNIQUE: Multidetector CT imaging of the chest, abdomen and pelvis was performed following the standard protocol during bolus administration of intravenous contrast. CONTRAST:  156mL ISOVUE-300 IOPAMIDOL (ISOVUE-300) INJECTION 61%, additional oral enteric contrast COMPARISON:  None. FINDINGS: CT CHEST FINDINGS Cardiovascular: No significant vascular findings. Normal heart size. No pericardial effusion. Mediastinum/Nodes: No enlarged mediastinal, hilar, or axillary lymph nodes. Thyroid gland, trachea, and esophagus demonstrate no significant findings. Lungs/Pleura: Lungs are clear. No pleural effusion or pneumothorax. Musculoskeletal: No chest wall mass or suspicious bone lesions identified. CT ABDOMEN PELVIS FINDINGS Hepatobiliary: No focal liver abnormality is seen. No gallstones, gallbladder wall thickening, or biliary dilatation. Pancreas: Unremarkable. No pancreatic ductal dilatation or surrounding inflammatory changes. Spleen: Normal in size without focal abnormality. Adrenals/Urinary Tract: Adrenal glands are unremarkable. Kidneys are normal, without renal calculi, focal lesion, or hydronephrosis. Bladder is unremarkable. Stomach/Bowel: Stomach is within normal limits. Appendix appears normal. No evidence of bowel wall thickening, distention, or inflammatory changes. Vascular/Lymphatic: No significant vascular findings are present. No enlarged abdominal or pelvic lymph nodes. Reproductive: No mass or other abnormality. Other: No abdominal wall hernia or abnormality. No abdominopelvic ascites. Musculoskeletal: No acute or significant osseous findings. IMPRESSION: No CT evidence of metastatic disease in the chest, abdomen, or pelvis. Electronically Signed   By: Eddie Candle M.D.   On: 03/03/2019 14:37   Nm Lymph/gland  Result Date: 03/12/2019 CLINICAL DATA:  Melanoma.  Large melanoma in the LEFT upper back. EXAM: NUCLEAR MEDICINE LYMPHANGIOGRAPHY  TECHNIQUE: Sequential images were obtained following intradermal injection of radiopharmaceutical at the tumor site in the LEFT upper back. RADIOPHARMACEUTICALS:  0.5 mCi millipore-filtered Tc-42m sulfur colloid COMPARISON:  Neck CT 03/03/2019 FINDINGS: Injection site noted about the large (6 cm) exophytic lesion in the high central back. Faint linear activity is noted extending towards the RIGHT axilla on immediate imaging. A lymph channel is identified however no clear lymph node is identified in the RIGHT axilla. More delayed imaging (up to 3 hours) demonstrates some uptake in the thyroid gland which indicate free pertechnetate. No activity noted in the groin region. Activity noted in the bladder. IMPRESSION: 1. No clear lymph node mapping. Lymph channel tracks towards the RIGHT axilla. 2. On delayed imaging there is uptake within thyroid gland as well as within the bladder which would indicate more than typical free pertechnetate in the preparation. Repeat exam may provide better localization of sentinel node. Electronically Signed   By: Suzy Bouchard M.D.   On: 03/12/2019 14:46    Assessment and Plan:   63 year old man with the following:  1.  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  This was noted in 2015 and have increased in size and was biopsied in 2020 confirmed the presence of melanoma.  Imaging studies showed a large tumor arising from the posterior cervical area but no evidence of metastatic disease.  The natural course of this disease and treatment  options were discussed today with patient.  Primary surgical therapy under the care of Dr. Barry Dienes as well as Dr.Thimmappa is planned in the near future.  Imaging studies were discussed with the patient today which showed no evidence of metastatic disease.  The importance of surgical resection and sentinel lymph node sampling was explained today patient in details as well as the pattern of melanoma spread and  risk of relapse.  The role of adjuvant immunotherapy was discussed today in detail.  He has no gross metastatic disease at this time but certainly appears to be at high risk of developing microscopic metastasis and the role of immunotherapy may be critical in him.  Complication associated with this therapy as well as the rationale for its use was discussed.  The plan would be to complete surgical resection initially and subsequently follow-up after that to discuss adjuvant immunotherapy depending on his lymph node status.  2.  Follow-up: We will be in the next 2 months to follow his progress after surgical resection and recovery.  60  minutes was spent with the patient face-to-face today.  More than 50% of time was spent on reviewing his disease status, imaging studies, the rationale for surgery, complications or immunotherapy as well as answering questions regarding future plan of care.    Thank you for the referral.  A copy of this consult has been forwarded to the requesting physician.

## 2019-03-16 ENCOUNTER — Telehealth: Payer: Self-pay | Admitting: Oncology

## 2019-03-16 NOTE — Telephone Encounter (Signed)
SChedueld appt per 4/3 sch message.  Patient aware of appt date and time.

## 2019-03-17 ENCOUNTER — Ambulatory Visit (HOSPITAL_COMMUNITY): Admission: RE | Admit: 2019-03-17 | Payer: Medicare Other | Source: Ambulatory Visit

## 2019-03-19 ENCOUNTER — Other Ambulatory Visit: Payer: Self-pay

## 2019-03-19 ENCOUNTER — Encounter (HOSPITAL_COMMUNITY)
Admission: RE | Admit: 2019-03-19 | Discharge: 2019-03-19 | Disposition: A | Discharge status: Home / Self Care | Payer: Medicare Other | Source: Ambulatory Visit | Attending: General Surgery | Admitting: General Surgery

## 2019-03-19 DIAGNOSIS — C4359 Malignant melanoma of other part of trunk: Secondary | ICD-10-CM

## 2019-03-24 ENCOUNTER — Other Ambulatory Visit: Payer: Self-pay | Admitting: General Surgery

## 2019-03-24 DIAGNOSIS — C4359 Malignant melanoma of other part of trunk: Secondary | ICD-10-CM

## 2019-03-30 NOTE — H&P (Signed)
[name] Documented: 03/02/2019 9:49 AM Location: [facility] Patient #: [id number] DOB: Jan 20, 1956 Single / Language: Walter Rodriguez / Race: White Male   History of Present Illness Stark Klein MD; 03/02/2019 10:58 AM) The patient is a 63 year old male who presents for a follow-up for Malignant melanoma. Pt presented to Dr. Dalbert Batman with large necrotic tumor on back last week. He did a biopsy that was positive for melanoma. The patient reports that it bled a bit after that. He denies pain. He is here to discuss surgical planning. Scans are set up for tomorrow.    Allergies (Arvin, CMA; 03/02/2019 9:50 AM) No Known Allergies [03/02/2019]: No Known Drug Allergies [02/24/2019]: Allergies Reconciled   Medication History Nance Pew, CMA; 03/02/2019 9:50 AM) No Current Medications Medications Reconciled    Review of Systems Stark Klein MD; 03/02/2019 10:58 AM) All other systems negative  Vitals (Sabrina Canty CMA; 03/02/2019 9:51 AM) 03/02/2019 9:50 AM Weight: 172.38 lb Height: 69in Body Surface Area: 1.94 m Body Mass Index: 25.46 kg/m  Temp.: 97.70F(Temporal)  Pulse: 99 (Regular)  P.OX: 97% (Room air) BP: 148/84 (Sitting, Left Arm, Standard)       Physical Exam Stark Klein MD; 03/02/2019 10:59 AM) General Mental Status-Alert. General Appearance-Consistent with stated age. Hydration-Well hydrated. Voice-Normal.  Integumentary Note: large necrotic foul smelling mass 9x6 cm. it comes off the back around 4 cm. It is friable and weeping foul drainage.   Head and Neck Head-normocephalic, atraumatic with no lesions or palpable masses.  Eye Sclera/Conjunctiva - Bilateral-No scleral icterus.  Chest and Lung Exam Chest and lung exam reveals -quiet, even and easy respiratory effort with no use of accessory muscles. Inspection Chest Wall - Normal. Back - normal.  Breast - Did not  examine.  Cardiovascular Cardiovascular examination reveals -normal pedal pulses bilaterally. Note: regular rate and rhythm  Abdomen Inspection-Inspection Normal. Palpation/Percussion Palpation and Percussion of the abdomen reveal - Soft, Non Tender, No Rebound tenderness, No Rigidity (guarding) and No hepatosplenomegaly.  Peripheral Vascular Upper Extremity Inspection - Bilateral - Normal - No Clubbing, No Cyanosis, No Edema, Pulses Intact. Lower Extremity Palpation - Edema - Bilateral - No edema.  Neurologic Neurologic evaluation reveals -alert and oriented x 3 with no impairment of recent or remote memory. Mental Status-Normal.  Musculoskeletal Global Assessment -Note: no gross deformities.  Normal Exam - Left-Upper Extremity Strength Normal and Lower Extremity Strength Normal. Normal Exam - Right-Upper Extremity Strength Normal and Lower Extremity Strength Normal.  Lymphatic Head & Neck  General Head & Neck Lymphatics: Bilateral - Description - Normal. Axillary  General Axillary Region: Bilateral - Description - Normal. Tenderness - Non Tender. Note: ? enlarged node right neck. Cannot palpate definitively enlarged nodes in axillae.     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  Will add CT abd/pelvis to look for liver/visceral mets. If no spread is seen, will need lymphoscintogram to see where it maps.  If metastatic dx is seen, would not plan any lymph node surgery. If lymph node mets are seen, would discuss whether we need axillary lymph node dissection or not.  Will touch base by phone.  Also, this is a very large lesion so I will involve plastics in reconstruction. Current Plans CT ABDOMEN PELVIS W CON ([id number]) (Discussed with Dr. Clovis Riley; oral contrast to look at bowel loops, triple phase liver protocol to eval  for  liver mets.) Follow Up - Call [facility] office after tests / studies doneto discuss further plans Referred to Surgery - Plastic, for evaluation and follow up (Plastic Surgery). Routine. Referred to Oncology, for evaluation and follow up (Oncology). Routine.   Signed by Stark Klein, MD (03/02/2019 11:04 AM)

## 2019-03-31 ENCOUNTER — Encounter (HOSPITAL_COMMUNITY): Payer: Self-pay | Admitting: *Deleted

## 2019-03-31 ENCOUNTER — Other Ambulatory Visit: Payer: Self-pay

## 2019-03-31 NOTE — Progress Notes (Signed)
Pt denies SOB, chest pain, and being under the care of a cardiologist. Pt denies having a stress test, echo and cardiac cath. Pt denies having an EKG and chest x ray. Pt denies recent labs. Pt made aware to stop taking  Aspirin (unless otherwise advised by surgeon), vitamins ( last dose this am), fish oil and herbal medications. Do not take any NSAIDs ie: Ibuprofen, Advil, Naproxen (Aleve), Motrin, BC and Goody Powder.   Coronavirus Screening  Pt denies that he and his sister experienced the following symptoms:  Cough yes/no: No Fever (>100.11F)  yes/no: No Runny nose yes/no: No Sore throat yes/no: No Difficulty breathing/shortness of breath  yes/no: No  Have you or a family member traveled in the last 14 days and where? yes/no: No  Pt reminded that hospital visitation restrictions are in effect and the importance of the restrictions.  Pt verbalized understanding of all pre-op instructions.

## 2019-04-01 ENCOUNTER — Ambulatory Visit (HOSPITAL_COMMUNITY)
Admission: RE | Admit: 2019-04-01 | Discharge: 2019-04-01 | Disposition: A | Discharge status: Home / Self Care | Payer: Medicare Other | Attending: General Surgery | Admitting: General Surgery

## 2019-04-01 ENCOUNTER — Ambulatory Visit (HOSPITAL_COMMUNITY): Payer: Medicare Other | Admitting: Anesthesiology

## 2019-04-01 ENCOUNTER — Ambulatory Visit (HOSPITAL_COMMUNITY)
Admission: RE | Admit: 2019-04-01 | Discharge: 2019-04-01 | Disposition: A | Discharge status: Home / Self Care | Payer: Medicare Other | Source: Ambulatory Visit | Attending: General Surgery | Admitting: General Surgery

## 2019-04-01 ENCOUNTER — Encounter (HOSPITAL_COMMUNITY): Payer: Self-pay

## 2019-04-01 ENCOUNTER — Encounter (HOSPITAL_COMMUNITY)
Admission: RE | Disposition: A | Discharge status: Home / Self Care | Payer: Self-pay | Source: Home / Self Care | Attending: General Surgery

## 2019-04-01 DIAGNOSIS — C4359 Malignant melanoma of other part of trunk: Secondary | ICD-10-CM | POA: Insufficient documentation

## 2019-04-01 HISTORY — PX: MELANOMA EXCISION WITH SENTINEL LYMPH NODE BIOPSY: SHX5267

## 2019-04-01 HISTORY — DX: Malignant melanoma of skin, unspecified: C43.9

## 2019-04-01 LAB — COMPREHENSIVE METABOLIC PANEL
ALT: 14 U/L (ref 0–44)
AST: 17 U/L (ref 15–41)
Albumin: 3 g/dL — ABNORMAL LOW (ref 3.5–5.0)
Alkaline Phosphatase: 43 U/L (ref 38–126)
Anion gap: 12 (ref 5–15)
BUN: 13 mg/dL (ref 8–23)
CO2: 21 mmol/L — ABNORMAL LOW (ref 22–32)
Calcium: 8.9 mg/dL (ref 8.9–10.3)
Chloride: 104 mmol/L (ref 98–111)
Creatinine, Ser: 1.16 mg/dL (ref 0.61–1.24)
GFR calc Af Amer: >60 mL/min (ref >60)
GFR calc non Af Amer: >60 mL/min (ref >60)
Glucose, Bld: 99 mg/dL (ref 70–99)
Potassium: 4 mmol/L (ref 3.5–5.1)
Sodium: 137 mmol/L (ref 135–145)
Total Bilirubin: 0.8 mg/dL (ref 0.3–1.2)
Total Protein: 6.6 g/dL (ref 6.5–8.1)

## 2019-04-01 LAB — CBC WITH DIFFERENTIAL/PLATELET
Abs Immature Granulocytes: 0.05 10*3/uL (ref 0.00–0.07)
Basophils Absolute: 0.1 10*3/uL (ref 0.0–0.1)
Basophils Relative: 0 %
Eosinophils Absolute: 0.1 10*3/uL (ref 0.0–0.5)
Eosinophils Relative: 1 %
HCT: 38.5 % — ABNORMAL LOW (ref 39.0–52.0)
Hemoglobin: 12 g/dL — ABNORMAL LOW (ref 13.0–17.0)
Immature Granulocytes: 0 %
Lymphocytes Relative: 15 %
Lymphs Abs: 1.8 10*3/uL (ref 0.7–4.0)
MCH: 24.8 pg — ABNORMAL LOW (ref 26.0–34.0)
MCHC: 31.2 g/dL (ref 30.0–36.0)
MCV: 79.5 fL — ABNORMAL LOW (ref 80.0–100.0)
Monocytes Absolute: 0.8 10*3/uL (ref 0.1–1.0)
Monocytes Relative: 7 %
Neutro Abs: 9 10*3/uL — ABNORMAL HIGH (ref 1.7–7.7)
Neutrophils Relative %: 77 %
Platelets: 390 10*3/uL (ref 150–400)
RBC: 4.84 MIL/uL (ref 4.22–5.81)
RDW: 14.9 % (ref 11.5–15.5)
WBC: 11.8 10*3/uL — ABNORMAL HIGH (ref 4.0–10.5)
nRBC: 0 % (ref 0.0–0.2)

## 2019-04-01 SURGERY — MELANOMA EXCISION WITH SENTINEL LYMPH NODE BIOPSY
Anesthesia: General | Site: Back

## 2019-04-01 MED ORDER — CHLORHEXIDINE GLUCONATE CLOTH 2 % EX PADS: 6.0000 | MEDICATED_PAD | Freq: Once | CUTANEOUS | Status: DC

## 2019-04-01 MED ORDER — DEXAMETHASONE SODIUM PHOSPHATE 10 MG/ML IJ SOLN
INTRAMUSCULAR | Status: DC | PRN
  Administered 2019-04-01: 8 mg via INTRAVENOUS

## 2019-04-01 MED ORDER — BUPIVACAINE-EPINEPHRINE (PF) 0.25% -1:200000 IJ SOLN
INTRAMUSCULAR | Status: AC
  Filled 2019-04-01: qty 30

## 2019-04-01 MED ORDER — CEFAZOLIN SODIUM-DEXTROSE 2-4 GM/100ML-% IV SOLN
INTRAVENOUS | Status: AC
  Filled 2019-04-01: qty 100

## 2019-04-01 MED ORDER — LIDOCAINE HCL 1 % IJ SOLN
INTRAMUSCULAR | Status: DC | PRN
  Administered 2019-04-01: 30 mL

## 2019-04-01 MED ORDER — ROCURONIUM BROMIDE 50 MG/5ML IV SOSY
PREFILLED_SYRINGE | INTRAVENOUS | Status: AC
  Filled 2019-04-01: qty 5

## 2019-04-01 MED ORDER — "ABDOMINAL PAD 8""X10"" PADS": 1.0000 [IU] | MEDICATED_PAD | Freq: Every day | 7 refills | Status: DC

## 2019-04-01 MED ORDER — OXYCODONE HCL 5 MG PO TABS: 5.0000 mg | ORAL_TABLET | Freq: Four times a day (QID) | ORAL | 0 refills | Status: DC | PRN

## 2019-04-01 MED ORDER — CEFAZOLIN SODIUM-DEXTROSE 2-4 GM/100ML-% IV SOLN
2.0000 g | INTRAVENOUS | Status: AC
  Administered 2019-04-01: 14:00:00 2 g via INTRAVENOUS

## 2019-04-01 MED ORDER — FENTANYL CITRATE (PF) 250 MCG/5ML IJ SOLN
INTRAMUSCULAR | Status: AC
  Filled 2019-04-01: qty 5

## 2019-04-01 MED ORDER — MIDAZOLAM HCL 5 MG/5ML IJ SOLN
INTRAMUSCULAR | Status: DC | PRN
  Administered 2019-04-01: 2 mg via INTRAVENOUS

## 2019-04-01 MED ORDER — LACTATED RINGERS IV SOLN
INTRAVENOUS | Status: DC
  Administered 2019-04-01 (×2): via INTRAVENOUS

## 2019-04-01 MED ORDER — SODIUM CHLORIDE 0.9 % IV SOLN
INTRAVENOUS | Status: DC | PRN
  Administered 2019-04-01: 40 ug/min via INTRAVENOUS

## 2019-04-01 MED ORDER — GABAPENTIN 300 MG PO CAPS
300.0000 mg | ORAL_CAPSULE | ORAL | Status: AC
  Administered 2019-04-01: 11:00:00 300 mg via ORAL

## 2019-04-01 MED ORDER — FENTANYL CITRATE (PF) 100 MCG/2ML IJ SOLN
INTRAMUSCULAR | Status: AC
  Filled 2019-04-01: qty 2

## 2019-04-01 MED ORDER — LIDOCAINE 2% (20 MG/ML) 5 ML SYRINGE
INTRAMUSCULAR | Status: DC | PRN
  Administered 2019-04-01: 100 mg via INTRAVENOUS

## 2019-04-01 MED ORDER — ACETAMINOPHEN 500 MG PO TABS
1000.0000 mg | ORAL_TABLET | ORAL | Status: AC
  Administered 2019-04-01: 1000 mg via ORAL

## 2019-04-01 MED ORDER — LIDOCAINE HCL 1 % IJ SOLN
INTRAMUSCULAR | Status: AC
  Filled 2019-04-01: qty 20

## 2019-04-01 MED ORDER — SUCCINYLCHOLINE CHLORIDE 200 MG/10ML IV SOSY
PREFILLED_SYRINGE | INTRAVENOUS | Status: AC
  Filled 2019-04-01: qty 10

## 2019-04-01 MED ORDER — SODIUM CHLORIDE 0.9 % EX SOLN: 1.0000 "application " | Freq: Every day | CUTANEOUS | 4 refills | Status: DC

## 2019-04-01 MED ORDER — ESMOLOL HCL 100 MG/10ML IV SOLN
INTRAVENOUS | Status: AC
  Filled 2019-04-01: qty 10

## 2019-04-01 MED ORDER — 0.9 % SODIUM CHLORIDE (POUR BTL) OPTIME
TOPICAL | Status: DC | PRN
  Administered 2019-04-01: 1000 mL

## 2019-04-01 MED ORDER — LIDOCAINE 2% (20 MG/ML) 5 ML SYRINGE
INTRAMUSCULAR | Status: AC
  Filled 2019-04-01: qty 5

## 2019-04-01 MED ORDER — GABAPENTIN 300 MG PO CAPS
ORAL_CAPSULE | ORAL | Status: AC
  Administered 2019-04-01: 300 mg via ORAL
  Filled 2019-04-01: qty 1

## 2019-04-01 MED ORDER — SUGAMMADEX SODIUM 200 MG/2ML IV SOLN
INTRAVENOUS | Status: DC | PRN
  Administered 2019-04-01: 200 mg via INTRAVENOUS

## 2019-04-01 MED ORDER — TECHNETIUM TC 99M SULFUR COLLOID FILTERED
0.5000 | Freq: Once | INTRAVENOUS | Status: AC | PRN
  Administered 2019-04-01: 0.5 via INTRADERMAL

## 2019-04-01 MED ORDER — METHYLENE BLUE 1 % INJ SOLN
INTRAMUSCULAR | Status: DC | PRN
  Administered 2019-04-01: 2 mL

## 2019-04-01 MED ORDER — FENTANYL CITRATE (PF) 250 MCG/5ML IJ SOLN
INTRAMUSCULAR | Status: DC | PRN
  Administered 2019-04-01: 100 ug via INTRAVENOUS
  Administered 2019-04-01 (×2): 25 ug via INTRAVENOUS

## 2019-04-01 MED ORDER — "KERLIX BANDAGE ROLL 4.5""X9.3' MISC": 1.0000 "application " | Freq: Every day | 7 refills | Status: DC

## 2019-04-01 MED ORDER — PROPOFOL 10 MG/ML IV BOLUS
INTRAVENOUS | Status: AC
  Filled 2019-04-01: qty 20

## 2019-04-01 MED ORDER — METHYLENE BLUE 0.5 % INJ SOLN
INTRAVENOUS | Status: AC
  Filled 2019-04-01: qty 10

## 2019-04-01 MED ORDER — PROPOFOL 10 MG/ML IV BOLUS
INTRAVENOUS | Status: DC | PRN
  Administered 2019-04-01: 150 mg via INTRAVENOUS
  Administered 2019-04-01: 30 mg via INTRAVENOUS
  Administered 2019-04-01: 20 mg via INTRAVENOUS

## 2019-04-01 MED ORDER — MIDAZOLAM HCL 2 MG/2ML IJ SOLN
INTRAMUSCULAR | Status: AC
  Filled 2019-04-01: qty 2

## 2019-04-01 MED ORDER — ONDANSETRON HCL 4 MG/2ML IJ SOLN
INTRAMUSCULAR | Status: DC | PRN
  Administered 2019-04-01: 4 mg via INTRAVENOUS

## 2019-04-01 MED ORDER — ACETAMINOPHEN 500 MG PO TABS
ORAL_TABLET | ORAL | Status: AC
  Administered 2019-04-01: 11:00:00 1000 mg via ORAL
  Filled 2019-04-01: qty 2

## 2019-04-01 MED ORDER — ROCURONIUM BROMIDE 10 MG/ML (PF) SYRINGE
PREFILLED_SYRINGE | INTRAVENOUS | Status: DC | PRN
  Administered 2019-04-01: 100 mg via INTRAVENOUS

## 2019-04-01 SURGICAL SUPPLY — 53 items
ADH SKN CLS APL DERMABOND .7 (GAUZE/BANDAGES/DRESSINGS) ×1
BLADE SURG 10 STRL SS (BLADE) ×2 IMPLANT
BNDG COHESIVE 4X5 TAN STRL (GAUZE/BANDAGES/DRESSINGS) IMPLANT
BNDG GAUZE ELAST 4 BULKY (GAUZE/BANDAGES/DRESSINGS) ×3 IMPLANT
CANISTER SUCT 3000ML PPV (MISCELLANEOUS) ×2 IMPLANT
CHLORAPREP W/TINT 26ML (MISCELLANEOUS) ×2 IMPLANT
CLIP VESOCCLUDE MED 24/CT (CLIP) ×2 IMPLANT
CLIP VESOCCLUDE SM WIDE 24/CT (CLIP) ×2 IMPLANT
CONT SPEC 4OZ CLIKSEAL STRL BL (MISCELLANEOUS) ×6 IMPLANT
COVER MAYO STAND STRL (DRAPES) ×1 IMPLANT
COVER PROBE W GEL 5X96 (DRAPES) ×2 IMPLANT
COVER SURGICAL LIGHT HANDLE (MISCELLANEOUS) ×2 IMPLANT
COVER WAND RF STERILE (DRAPES) ×2 IMPLANT
DECANTER SPIKE VIAL GLASS SM (MISCELLANEOUS) ×3 IMPLANT
DERMABOND ADVANCED (GAUZE/BANDAGES/DRESSINGS) ×1
DERMABOND ADVANCED .7 DNX12 (GAUZE/BANDAGES/DRESSINGS) IMPLANT
DRAPE HALF SHEET 40X57 (DRAPES) ×3 IMPLANT
DRAPE LAPAROSCOPIC ABDOMINAL (DRAPES) ×2 IMPLANT
DRAPE UNIVERSAL PACK (DRAPES) ×1 IMPLANT
DRAPE UTILITY XL STRL (DRAPES) ×1 IMPLANT
DRSG PAD ABDOMINAL 8X10 ST (GAUZE/BANDAGES/DRESSINGS) ×2 IMPLANT
DRSG TEGADERM 4X4.75 (GAUZE/BANDAGES/DRESSINGS) ×2 IMPLANT
ELECT REM PT RETURN 9FT ADLT (ELECTROSURGICAL) ×2
ELECTRODE REM PT RTRN 9FT ADLT (ELECTROSURGICAL) ×1 IMPLANT
GLOVE BIO SURGEON STRL SZ 6 (GLOVE) ×3 IMPLANT
GLOVE INDICATOR 6.5 STRL GRN (GLOVE) ×3 IMPLANT
GOWN STRL REUS W/ TWL LRG LVL3 (GOWN DISPOSABLE) ×2 IMPLANT
GOWN STRL REUS W/TWL 2XL LVL3 (GOWN DISPOSABLE) ×4 IMPLANT
GOWN STRL REUS W/TWL LRG LVL3 (GOWN DISPOSABLE) ×8
KIT BASIN OR (CUSTOM PROCEDURE TRAY) ×2 IMPLANT
KIT TURNOVER KIT B (KITS) ×2 IMPLANT
NDL 18GX1X1/2 (RX/OR ONLY) (NEEDLE) ×1 IMPLANT
NDL FILTER BLUNT 18X1 1/2 (NEEDLE) IMPLANT
NDL HYPO 25GX1X1/2 BEV (NEEDLE) ×2 IMPLANT
NEEDLE 18GX1X1/2 (RX/OR ONLY) (NEEDLE) ×2 IMPLANT
NEEDLE FILTER BLUNT 18X 1/2SAF (NEEDLE) ×1
NEEDLE FILTER BLUNT 18X1 1/2 (NEEDLE) ×1 IMPLANT
NEEDLE HYPO 25GX1X1/2 BEV (NEEDLE) ×4 IMPLANT
NS IRRIG 1000ML POUR BTL (IV SOLUTION) ×2 IMPLANT
PACK GENERAL/GYN (CUSTOM PROCEDURE TRAY) ×2 IMPLANT
PAD ARMBOARD 7.5X6 YLW CONV (MISCELLANEOUS) ×4 IMPLANT
PENCIL SMOKE EVACUATOR (MISCELLANEOUS) ×2 IMPLANT
SPECIMEN JAR MEDIUM (MISCELLANEOUS) ×2 IMPLANT
STOCKINETTE IMPERVIOUS 9X36 MD (GAUZE/BANDAGES/DRESSINGS) ×2 IMPLANT
SUT MNCRL AB 4-0 PS2 18 (SUTURE) ×1 IMPLANT
SUT SILK 2 0 SH (SUTURE) ×1 IMPLANT
SUT VIC AB 3-0 SH 27 (SUTURE) ×2
SUT VIC AB 3-0 SH 27X BRD (SUTURE) ×2 IMPLANT
SUT VIC AB 3-0 SH 8-18 (SUTURE) ×1 IMPLANT
SUT VICRYL 4-0 PS2 18IN ABS (SUTURE) ×4 IMPLANT
SYR CONTROL 10ML LL (SYRINGE) ×4 IMPLANT
TOWEL OR 17X24 6PK STRL BLUE (TOWEL DISPOSABLE) ×2 IMPLANT
TOWEL OR 17X26 10 PK STRL BLUE (TOWEL DISPOSABLE) ×2 IMPLANT

## 2019-04-01 NOTE — Anesthesia Preprocedure Evaluation (Signed)
Anesthesia Evaluation  Patient identified by MRN, date of birth, ID band Patient awake    Reviewed: Allergy & Precautions, NPO status , Patient's Chart, lab work & pertinent test results  Airway Mallampati: II  TM Distance: >3 FB Neck ROM: Full    Dental no notable dental hx. (+) Teeth Intact, Dental Advisory Given   Pulmonary neg pulmonary ROS,    Pulmonary exam normal breath sounds clear to auscultation       Cardiovascular Exercise Tolerance: Good negative cardio ROS Normal cardiovascular exam Rhythm:Regular Rate:Normal     Neuro/Psych negative neurological ROS  negative psych ROS   GI/Hepatic negative GI ROS, Neg liver ROS,   Endo/Other  negative endocrine ROS  Renal/GU negative Renal ROS     Musculoskeletal   Abdominal   Peds  Hematology negative hematology ROS (+)   Anesthesia Other Findings Malignant melanoma of back   Reproductive/Obstetrics                             Anesthesia Physical Anesthesia Plan  ASA: II  Anesthesia Plan: General   Post-op Pain Management:    Induction: Intravenous, Rapid sequence and Cricoid pressure planned  PONV Risk Score and Plan: 3 and Treatment may vary due to age or medical condition, Ondansetron and Dexamethasone  Airway Management Planned: Oral ETT  Additional Equipment:   Intra-op Plan:   Post-operative Plan: Extubation in OR  Informed Consent: I have reviewed the patients History and Physical, chart, labs and discussed the procedure including the risks, benefits and alternatives for the proposed anesthesia with the patient or authorized representative who has indicated his/her understanding and acceptance.     Dental advisory given  Plan Discussed with: CRNA  Anesthesia Plan Comments:         Anesthesia Quick Evaluation

## 2019-04-01 NOTE — Discharge Instructions (Addendum)
Wound Packing Wound packing usually involves placing a moistened packing material into your wound and then covering it with an outer bandage (dressing). This helps promote proper healing of deep tissue and tissue under the skin. It also helps prevent bleeding, infection, and further injury. Wounds are packed until deep tissues heal (or in your case, while we wait for pathology prior to closure/skin grafting). The time it takes for this to occur is different for everyone. Your health care provider will show you how to pack and dress your wound. Using gloves and a clean technique is important in order to avoid spreading germs into your wound. Supplies needed:  Soap and water.  Disposable gloves.  Wetting solution (saline).  Clean bowl.  Clean packing material (gauze or gauze sponges).  Clean paper towels.  Outer dressing.  Tape.  Cotton-tipped swabs.  Small plastic bag. How to pack your wound Follow your health care provider's instructions on how often you need to change dressings and pack your wound. You will likely be asked to change dressings 1-2 times a day. Preparing the new packing material  1. Clean and disinfect your work surface or countertop. 2. Set a plastic bag on or near your work surface. 3. Wash your hands well with soap and water. 4. Put a clean paper towel on the counter. 5. Put a clean bowl on the towel. Be sure to only touch the outside of the bowl when handling it. 6. Pour wetting solution into the bowl. 7. Cut your packing material (gauze or sponges) to the right size for your wound. Drop it into the bowl. 8. Cut 4 tape strips that you will use to seal the outer dressing. 9. Put cotton-tipped swabs on the clean paper towel. Removing the old packing material and dressing 1. Put on a set of gloves. 2. Gently remove the old dressing and packing material. 3. Remove your gloves. 4. Put the removed items, including the gloves, into the plastic bag to throw away  later. 5. Wash your hands well with soap and water again. Applying the new packing material and dressing 1. Put on a new set of gloves. 2. Squeeze the packing material in the bowl to release the extra liquid. The packing material should be moist, but not dripping wet. 3. Gently place the packing material into the wound. Use a cotton-tipped swab to guide it into place, filling all of the space. 4. Dry your gloved fingertips on the paper towel. 5. Open up your outer dressing supplies and put them on a dry part of the paper towel. Keep them from getting wet. 6. Place the outer dressing over the packed wound. 7. Tape the 4 outer edges of the outer dressing in place. 8. Remove your gloves. 9. Wash your hands again with soap and water. 10. Clean and disinfect your work surface or countertop. General tips  Follow your health care provider's instructions on how much to pack the wound. At first, you may need to pack it more tightly to help stop bleeding. As the wound begins to heal inside, you will use less packing material and pack the wound loosely to allow tissue to heal slowly from the inside out.  Keep the dressing clean and dry.  Follow any other instructions given by your health care provider on how to aid healing. This may include applying warm or cold compresses, raising (elevating) the affected area, or wearing a compression dressing.  Check your wound site every day for signs of infection. Check for: ?  More redness, swelling, or pain. ? More fluid or blood. ? Warmth. ? Pus or a bad smell.  Ask your health care provider about avoiding sun exposure and using sunscreen when the dressings are no longer needed.  Keep all follow-up visits as told by your health care provider. This is important. Contact a health care provider if:  You have more drainage, redness, swelling, or pain at your wound site.  You notice a bad smell coming from the wound site.  Your wound site feels warm to the  touch.  Your wound becomes larger or deeper.  Your wound changes in size or depth. Get help right away if:  Your pain is not controlled with pain medicine.  The tissue inside your wound changes color from pink to white, yellow, or black.  You have a fever.  You have shaking chills.  You are having trouble packing your wound. Summary  Wound packing usually involves placing a moistened packing material into your wound and then covering it with an outer bandage (dressing).  Follow your health care provider's instructions on how often you need to change dressings and pack your wound. You will likely be asked to change dressings 1-2 times a day.  When packing your wound, it is important to use gloves and a clean technique in order to avoid spreading germs into the wound.  Check your wound site every day for signs of infection. This information is not intended to replace advice given to you by your health care provider. Make sure you discuss any questions you have with your health care provider.    [facility] Office Phone Number [phone]   POST OP INSTRUCTIONS  Always review your discharge instruction sheet given to you by the facility where your surgery was performed.  IF YOU HAVE DISABILITY OR FAMILY LEAVE FORMS, YOU MUST BRING THEM TO THE OFFICE FOR PROCESSING.  DO NOT GIVE THEM TO YOUR DOCTOR.  1. A prescription for pain medication may be given to you upon discharge.  Take your pain medication as prescribed, if needed.  If narcotic pain medicine is not needed, then you may take acetaminophen (Tylenol) or ibuprofen (Advil) as needed. 2. Take your usually prescribed medications unless otherwise directed 3. If you need a refill on your pain medication, please contact your pharmacy.  They will contact our office to request authorization.  Prescriptions will not be filled after 5pm or on week-ends. 4. You should eat very light the first 24 hours after surgery,  such as soup, crackers, pudding, etc.  Resume your normal diet the day after surgery 5. It is common to experience some constipation if taking pain medication after surgery.  Increasing fluid intake and taking a stool softener will usually help or prevent this problem from occurring.  A mild laxative (Milk of Magnesia or Miralax) should be taken according to package directions if there are no bowel movements after 48 hours. 6. You may shower in 48 hours.  The surgical glue will flake off in 2-3 weeks.   7. ACTIVITIES:  No strenuous activity or heavy lifting for 2 weeks or more depending on Dr. Para Skeans closure.   a. You may drive when you no longer are taking prescription pain medication, you can comfortably wear a seatbelt, and you can safely maneuver your car and apply brakes. b. RETURN TO WORK:  __________to be determined._______________ Dennis Bast should see your doctor in the office for a follow-up appointment approximately three-four weeks after your surgery.  WHEN TO CALL YOUR DOCTOR: 1. Fever over 101.0 2. Nausea and/or vomiting. 3. Extreme swelling or bruising. 4. Continued bleeding from incision. 5. Increased pain, redness, or drainage from the incision.  The clinic staff is available to answer your questions during regular business hours.  Please dont hesitate to call and ask to speak to one of the nurses for clinical concerns.  If you have a medical emergency, go to the nearest emergency room or call 911.  A surgeon from [facility] is always on call at the hospital.  For further questions, please visit [email or web address]

## 2019-04-01 NOTE — Interval H&P Note (Signed)
History and Physical Interval Note:  04/01/2019 1:25 PM  Walter Rodriguez  has presented today for surgery, with the diagnosis of MALIGNANT MELANOMA BACK.  The various methods of treatment have been discussed with the patient and family. After consideration of risks, benefits and other options for treatment, the patient has consented to  Procedure(s): WIDE LOCAL EXCISION WITH SENTINEL LYMPH NODE BIOPSY (N/A) as a surgical intervention.  The lymphoscintogram.did not definitely map.  After the tumor is off, we will assess signal in the axillae.  The patient's history has been reviewed, patient examined, no change in status, stable for surgery.  I have reviewed the patient's chart and labs.  Questions were answered to the patient's satisfaction.     Stark Klein

## 2019-04-01 NOTE — Anesthesia Postprocedure Evaluation (Signed)
Anesthesia Post Note  Patient: Walter Rodriguez  Procedure(s) Performed: WIDE LOCAL EXCISION OF BACK MELANOMA WITH RIGHT AXILLARY SENTINEL LYMPH NODE BIOPSY (N/A Back)     Patient location during evaluation: PACU Anesthesia Type: General Level of consciousness: awake and alert Pain management: pain level controlled Vital Signs Assessment: post-procedure vital signs reviewed and stable Respiratory status: spontaneous breathing, nonlabored ventilation, respiratory function stable and patient connected to nasal cannula oxygen Cardiovascular status: blood pressure returned to baseline and stable Postop Assessment: no apparent nausea or vomiting Anesthetic complications: no    Last Vitals:  Vitals:   04/01/19 1715 04/01/19 1720  BP: 127/73 127/73  Pulse: 63 (!) 58  Resp: 13 12  Temp:    SpO2: 100% 100%    Last Pain:  Vitals:   04/01/19 1700  TempSrc:   PainSc: 0-No pain                 Barnet Glasgow

## 2019-04-01 NOTE — Op Note (Signed)
PRE-OPERATIVE DIAGNOSIS: [id number] extremely large upper back melanoma  POST-OPERATIVE DIAGNOSIS:  Same  PROCEDURE:  Procedure(s): Wide local excision 2 cm margins, right axillary sentinel lymph node mapping and biopsy  SURGEON:  Surgeon(s): Stark Klein, MD  ASSISTANT:  Ewell Poe, RNFA  ANESTHESIA:   local and general  DRAINS: none   LOCAL MEDICATIONS USED:  MARCAINE    and XYLOCAINE   SPECIMEN:  Source of Specimen:  3 axillary sentinel lymph nodes, wide local excision back  FINDINGS:  Large, ulcerated, exophytic necrotic back mass. SLN #1 cps 221, #2 palpable, #3 cps palpable and cps 50. Background count 5 cps.   DISPOSITION OF SPECIMEN:  PATHOLOGY  COUNTS:  YES  PLAN OF CARE: Discharge to home after PACU  PATIENT DISPOSITION:  PACU - hemodynamically stable.    PROCEDURE:   Pt was identified in the holding area, taken to the OR and intubated on the stretcher.  He was then flipped prone onto the OR table with appropriate padding.  Time out was performed according to the surgical safety checklist.  When all was correct, we continued.  Two mL methylene blue was injected intradermally around the melanoma.   The upper back was prepped and draped in sterile fashion.  The melanoma was identified and 2 cm margins were marked out. This was difficult because the exophytic portion was a bit stalk like, but there was ripple and indurated skin around the stalk.  Two cm were taken around the grossly abnormal skin.   A #10 blade was used to incise the skin around the melanoma.  The cautery was used to take the dissection down to the fascia.  The underlying tissue was extremely vascular, as expected.  The skin was marked in situ with silk suture.  The cautery was used to take the specimen off the fascia, and it was passed off the table.  Due to the large size of the defect, the area was packed with betadine soaked kerlix and covered with ABDs.  The patient will have closure with Dr. Iran Planas  after final margins are available.    The patient was flipped back supine.  He had two lymphoscintograms pre op and neither definitively mapped to a nodal basin.  The first one had some signal heading toward the right axilla, and the second one had faint mapping possible to both axillae.  Once the patient was supine with the tumor and tracer removed from the back, the axillae were evaluated with the neoprobe.  The left axilla had a signal of 4 cps.  The right axilla had a cps of 150.  Only the right was dissected.    The patient's right arm and chest were prepped and draped in sterile fashion.  The point of maximum signal intensity was identified with the neoprobe.  A 3-4 cm incision was made with a #15 blade.  The subcutaneous tissues were divided with the cautery.  A Weitlaner retractor was used to assist with visualization.  The tonsil clamp was used to bluntly dissect the axillary fat pad.  Three sentinel lymph nodes were identified as described above.  The lymphovascular channels were clipped with hemoclips.  The nodes were passed off as specimens.  Hemostasis was achieved with the cautery.  The axilla was irrigated and closed with 3-0 Vicryl deep dermal interrupted sutures and 4-0 Monocryl running subcuticular suture.  This was cleaned, dried, and dressed with dermabond.  Counts were correct.  Needle, sponge, and instrument counts were correct.  The patient was  awakened from anesthesia and taken to the PACU in stable condition.

## 2019-04-01 NOTE — Transfer of Care (Signed)
Immediate Anesthesia Transfer of Care Note  Patient: Walter Rodriguez  Procedure(s) Performed: WIDE LOCAL EXCISION OF BACK MELANOMA WITH RIGHT AXILLARY SENTINEL LYMPH NODE BIOPSY (N/A Back)  Patient Location: PACU  Anesthesia Type:General  Level of Consciousness: drowsy  Airway & Oxygen Therapy: Patient Spontanous Breathing and Patient connected to face mask oxygen  Post-op Assessment: Report given to RN and Post -op Vital signs reviewed and stable  Post vital signs: Reviewed and stable  Last Vitals:  Vitals Value Taken Time  BP 110/93 04/01/2019  4:30 PM  Temp    Pulse 76 04/01/2019  4:33 PM  Resp 18 04/01/2019  4:33 PM  SpO2 100 % 04/01/2019  4:33 PM  Vitals shown include unvalidated device data.  Last Pain:  Vitals:   04/01/19 1100  TempSrc: Oral      Patients Stated Pain Goal: 2 (56/86/16 [id number])  Complications: No apparent anesthesia complications

## 2019-04-01 NOTE — Anesthesia Procedure Notes (Signed)
Procedure Name: Intubation Performed by: Milford Cage, CRNA Pre-anesthesia Checklist: Patient identified, Emergency Drugs available, Suction available and Patient being monitored Patient Re-evaluated:Patient Re-evaluated prior to induction Oxygen Delivery Method: Circle System Utilized Preoxygenation: Pre-oxygenation with 100% oxygen Induction Type: IV induction and Rapid sequence Laryngoscope Size: Mac and 4 Grade View: Grade I Tube type: Oral Number of attempts: 1 Airway Equipment and Method: Stylet and Oral airway Placement Confirmation: ETT inserted through vocal cords under direct vision,  positive ETCO2 and breath sounds checked- equal and bilateral Secured at: 24 cm Tube secured with: Tape Dental Injury: Teeth and Oropharynx as per pre-operative assessment

## 2019-04-02 ENCOUNTER — Encounter (HOSPITAL_COMMUNITY): Payer: Self-pay | Admitting: General Surgery

## 2019-04-06 DIAGNOSIS — C4359 Malignant melanoma of other part of trunk: Secondary | ICD-10-CM | POA: Insufficient documentation

## 2019-04-06 NOTE — H&P (Signed)
Subjective:     Patient ID: Walter Rodriguez is a 63 y.o. male.  HPI  Returns for check and follow up discussion prior to planned surgery for skin graft, treatment open wound back post melanoma excision. States not taking any pain medication.  Notes onset mass 2015, due to finances did not pursue treatment until obtained Medicare. Biopsy done by Dr. Renda Rolls with melanoma- no report available. CT CAP and neck without distant disease.  tomorrow. Patient is 5 days post wide excision and SLN. Pathology pending.  Review of Systems  Skin: Positive for wound.      Objective:   Physical Exam  Cardiovascular: Normal rate, regular rhythm and normal heart sounds.  Pulmonary/Chest: Effort normal and breath sounds normal.   Back: open wound back with exposed muscle base and SQ fat wound margins clean minimal drainage, no bleeding, appr 12 x 12 cm size    Assessment:     Melanoma back s/p excision. SLN    Plan:      Pictures today. Plan staged procedure for coverage once margins clear. Pathology pending- discussed may need to push back surgery date if pathology not back in next 1-2 d.    Reviewed options healing on own, local flap, STSG. Plan skin graft from thighs to area. Reviewed donor site pain, healing. Plan use VAC post STSG to aid with graft take, reviewed sponge dressing and machine, donor site dressing and pain. Additional risks including but not limited to graft failure, prolonged wound healing, need for additional procedures, recurrence, contraction COVID19, anesthesia, infection DVT/PE, cardiopulmonary complications reviewed.  Plan OP procedure, GA. Patient prefers right thigh as donor site as sleeping on left side currently. Has oxycodone at home.  Spoke with sister by phone to review anticipated dressings and pathology pending.    Irene Limbo, MD [facility] [phone], pin [phone]

## 2019-04-07 NOTE — Progress Notes (Signed)
I spoke with Walter Rodriguez who said he is not sure if he is having skin graft tomorrow or not, "I have not heard from biopsies and I may need more surgery before skin graft."  I called Dr. Carley Hammed office.  Dr Alessandra Bevels is in surgery. Dr Alessandra Bevels had someone call me and said, she does not have biopsy results and she would check them after surgery.  Dr Alessandra Bevels is in surgery until 5 om.  I called Walter Dobler and told him to not expect to her anything until after 5.

## 2019-04-08 NOTE — Progress Notes (Signed)
Please let patient know margins and lymph nodes are negative.

## 2019-04-09 ENCOUNTER — Other Ambulatory Visit: Payer: Self-pay

## 2019-04-09 ENCOUNTER — Encounter (HOSPITAL_COMMUNITY): Payer: Self-pay | Admitting: *Deleted

## 2019-04-09 NOTE — Progress Notes (Signed)
Pt denies SOB, chest pain, and being under the care of a cardiologist. Pt denies having a stress test, echo and cardiac cath. Pt denies having an EKG and chest x ray.  Pt made aware to stop taking  Aspirin (unless otherwise advised by surgeon), vitamins, fish oil and herbal medications. Do not take any NSAIDs ie: Ibuprofen, Advil, Naproxen (Aleve), Motrin, BC and Goody Powder.   Coronavirus Screening  Pt denies that he and his sister tested positive for COVID -66. Pt denies that he and his sister experienced the following symptoms:  Cough yes/no: No Fever (>100.57F)  yes/no: No Runny nose yes/no: No Sore throat yes/no: No Difficulty breathing/shortness of breath  yes/no: No  Have you or a family member traveled in the last 14 days and where? yes/no: No  Pt reminded that hospital visitation restrictions are in effect and the importance of the restrictions.  Pt verbalized understanding of all pre-op instructions.

## 2019-04-10 ENCOUNTER — Ambulatory Visit (HOSPITAL_COMMUNITY): Payer: Medicare Other | Admitting: Anesthesiology

## 2019-04-10 ENCOUNTER — Encounter (HOSPITAL_COMMUNITY)
Admission: RE | Disposition: A | Discharge status: Home / Self Care | Payer: Self-pay | Source: Home / Self Care | Attending: Plastic Surgery

## 2019-04-10 ENCOUNTER — Encounter (HOSPITAL_COMMUNITY): Payer: Self-pay | Admitting: *Deleted

## 2019-04-10 ENCOUNTER — Ambulatory Visit (HOSPITAL_COMMUNITY)
Admission: RE | Admit: 2019-04-10 | Discharge: 2019-04-10 | Disposition: A | Discharge status: Home / Self Care | Payer: Medicare Other | Attending: Plastic Surgery | Admitting: Plastic Surgery

## 2019-04-10 DIAGNOSIS — Z428 Encounter for other plastic and reconstructive surgery following medical procedure or healed injury: Secondary | ICD-10-CM | POA: Diagnosis not present

## 2019-04-10 DIAGNOSIS — Z85820 Personal history of malignant melanoma of skin: Secondary | ICD-10-CM | POA: Insufficient documentation

## 2019-04-10 HISTORY — PX: SKIN SPLIT GRAFT: SHX444

## 2019-04-10 HISTORY — DX: Other injury of unspecified body region, initial encounter: T14.8XXA

## 2019-04-10 SURGERY — APPLICATION, GRAFT, SKIN, SPLIT-THICKNESS
Anesthesia: General

## 2019-04-10 MED ORDER — LACTATED RINGERS IV SOLN
INTRAVENOUS | Status: DC
  Administered 2019-04-10: 07:00:00 via INTRAVENOUS

## 2019-04-10 MED ORDER — DEXAMETHASONE SODIUM PHOSPHATE 10 MG/ML IJ SOLN
INTRAMUSCULAR | Status: AC
  Filled 2019-04-10: qty 1

## 2019-04-10 MED ORDER — ONDANSETRON HCL 4 MG/2ML IJ SOLN
INTRAMUSCULAR | Status: DC | PRN
  Administered 2019-04-10: 4 mg via INTRAVENOUS

## 2019-04-10 MED ORDER — ONDANSETRON HCL 4 MG/2ML IJ SOLN
INTRAMUSCULAR | Status: AC
  Filled 2019-04-10: qty 2

## 2019-04-10 MED ORDER — HYDROMORPHONE HCL 1 MG/ML IJ SOLN: 0.2500 mg | INTRAMUSCULAR | Status: DC | PRN

## 2019-04-10 MED ORDER — 0.9 % SODIUM CHLORIDE (POUR BTL) OPTIME
TOPICAL | Status: DC | PRN
  Administered 2019-04-10: 1000 mL

## 2019-04-10 MED ORDER — PROPOFOL 10 MG/ML IV BOLUS
INTRAVENOUS | Status: DC | PRN
  Administered 2019-04-10: 140 mg via INTRAVENOUS

## 2019-04-10 MED ORDER — ACETAMINOPHEN 500 MG PO TABS: 1000.0000 mg | ORAL_TABLET | Freq: Once | ORAL | Status: DC

## 2019-04-10 MED ORDER — DEXAMETHASONE SODIUM PHOSPHATE 10 MG/ML IJ SOLN
INTRAMUSCULAR | Status: DC | PRN
  Administered 2019-04-10: 10 mg via INTRAVENOUS

## 2019-04-10 MED ORDER — MIDAZOLAM HCL 2 MG/2ML IJ SOLN
INTRAMUSCULAR | Status: AC
  Filled 2019-04-10: qty 2

## 2019-04-10 MED ORDER — ROCURONIUM BROMIDE 10 MG/ML (PF) SYRINGE
PREFILLED_SYRINGE | INTRAVENOUS | Status: AC
  Filled 2019-04-10: qty 10

## 2019-04-10 MED ORDER — FENTANYL CITRATE (PF) 250 MCG/5ML IJ SOLN
INTRAMUSCULAR | Status: AC
  Filled 2019-04-10: qty 5

## 2019-04-10 MED ORDER — LIDOCAINE 2% (20 MG/ML) 5 ML SYRINGE
INTRAMUSCULAR | Status: DC | PRN
  Administered 2019-04-10: 60 mg via INTRAVENOUS

## 2019-04-10 MED ORDER — SUCCINYLCHOLINE CHLORIDE 200 MG/10ML IV SOSY
PREFILLED_SYRINGE | INTRAVENOUS | Status: AC
  Filled 2019-04-10: qty 20

## 2019-04-10 MED ORDER — FENTANYL CITRATE (PF) 250 MCG/5ML IJ SOLN
INTRAMUSCULAR | Status: DC | PRN
  Administered 2019-04-10: 50 ug via INTRAVENOUS
  Administered 2019-04-10: 100 ug via INTRAVENOUS

## 2019-04-10 MED ORDER — SUGAMMADEX SODIUM 200 MG/2ML IV SOLN
INTRAVENOUS | Status: DC | PRN
  Administered 2019-04-10: 180 mg via INTRAVENOUS

## 2019-04-10 MED ORDER — ROCURONIUM BROMIDE 10 MG/ML (PF) SYRINGE
PREFILLED_SYRINGE | INTRAVENOUS | Status: DC | PRN
  Administered 2019-04-10: 40 mg via INTRAVENOUS

## 2019-04-10 MED ORDER — CEFAZOLIN SODIUM-DEXTROSE 2-4 GM/100ML-% IV SOLN
2.0000 g | INTRAVENOUS | Status: AC
  Administered 2019-04-10: 2 g via INTRAVENOUS
  Filled 2019-04-10: qty 100

## 2019-04-10 MED ORDER — LIDOCAINE 2% (20 MG/ML) 5 ML SYRINGE
INTRAMUSCULAR | Status: AC
  Filled 2019-04-10: qty 5

## 2019-04-10 MED ORDER — MIDAZOLAM HCL 2 MG/2ML IJ SOLN
INTRAMUSCULAR | Status: DC | PRN
  Administered 2019-04-10: 2 mg via INTRAVENOUS

## 2019-04-10 MED ORDER — MINERAL OIL LIGHT 100 % EX OIL
TOPICAL_OIL | CUTANEOUS | Status: DC | PRN
  Administered 2019-04-10: 1 via TOPICAL

## 2019-04-10 MED ORDER — PROPOFOL 10 MG/ML IV BOLUS
INTRAVENOUS | Status: AC
  Filled 2019-04-10: qty 20

## 2019-04-10 MED ORDER — SUCCINYLCHOLINE CHLORIDE 200 MG/10ML IV SOSY
PREFILLED_SYRINGE | INTRAVENOUS | Status: DC | PRN
  Administered 2019-04-10: 100 mg via INTRAVENOUS

## 2019-04-10 SURGICAL SUPPLY — 62 items
APL SKNCLS STERI-STRIP NONHPOA (GAUZE/BANDAGES/DRESSINGS) ×2
APPLIER CLIP 9.375 MED OPEN (MISCELLANEOUS)
APR CLP MED 9.3 20 MLT OPN (MISCELLANEOUS)
BANDAGE ACE 4X5 VEL STRL LF (GAUZE/BANDAGES/DRESSINGS) IMPLANT
BANDAGE ACE 6X5 VEL STRL LF (GAUZE/BANDAGES/DRESSINGS) IMPLANT
BENZOIN TINCTURE PRP APPL 2/3 (GAUZE/BANDAGES/DRESSINGS) ×2 IMPLANT
BLADE CLIPPER SURG (BLADE) ×1 IMPLANT
BLADE DERMATOME SS (BLADE) ×2 IMPLANT
BNDG COHESIVE 4X5 TAN STRL (GAUZE/BANDAGES/DRESSINGS) ×1 IMPLANT
BNDG COHESIVE 6X5 TAN STRL LF (GAUZE/BANDAGES/DRESSINGS) ×2 IMPLANT
BNDG GAUZE ELAST 4 BULKY (GAUZE/BANDAGES/DRESSINGS) ×1 IMPLANT
CANISTER SUCT 3000ML PPV (MISCELLANEOUS) IMPLANT
CANISTER WOUND CARE 500ML ATS (WOUND CARE) IMPLANT
CLIP APPLIE 9.375 MED OPEN (MISCELLANEOUS) IMPLANT
CONT SPECI 4OZ STER CLIK (MISCELLANEOUS) IMPLANT
COVER SURGICAL LIGHT HANDLE (MISCELLANEOUS) ×2 IMPLANT
COVER WAND RF STERILE (DRAPES) ×2 IMPLANT
DERMACARRIERS GRAFT 1 TO 1.5 (DISPOSABLE) ×2
DRAIN WOUND SNY 15 RND (WOUND CARE) IMPLANT
DRAPE HALF SHEET 40X57 (DRAPES) ×4 IMPLANT
DRAPE INCISE IOBAN 66X45 STRL (DRAPES) IMPLANT
DRESSING TELFA 8X10 (GAUZE/BANDAGES/DRESSINGS) ×2 IMPLANT
DRSG ADAPTIC 3X8 NADH LF (GAUZE/BANDAGES/DRESSINGS) ×3 IMPLANT
DRSG PAD ABDOMINAL 8X10 ST (GAUZE/BANDAGES/DRESSINGS) ×4 IMPLANT
DRSG VAC ATS MED SENSATRAC (GAUZE/BANDAGES/DRESSINGS) ×1 IMPLANT
DRSG VAC ATS SM SENSATRAC (GAUZE/BANDAGES/DRESSINGS) IMPLANT
ELECT COATED BLADE 2.86 ST (ELECTRODE) ×2 IMPLANT
ELECT REM PT RETURN 9FT ADLT (ELECTROSURGICAL)
ELECTRODE REM PT RTRN 9FT ADLT (ELECTROSURGICAL) IMPLANT
EVACUATOR SILICONE 100CC (DRAIN) IMPLANT
GAUZE SPONGE 4X4 12PLY STRL (GAUZE/BANDAGES/DRESSINGS) ×2 IMPLANT
GAUZE XEROFORM 1X8 LF (GAUZE/BANDAGES/DRESSINGS) ×1 IMPLANT
GAUZE XEROFORM 5X9 LF (GAUZE/BANDAGES/DRESSINGS) ×1 IMPLANT
GEL ULTRASOUND 20GR AQUASONIC (MISCELLANEOUS) ×2 IMPLANT
GLOVE BIO SURGEON STRL SZ 6 (GLOVE) ×2 IMPLANT
GOWN STRL REUS W/ TWL LRG LVL3 (GOWN DISPOSABLE) ×2 IMPLANT
GOWN STRL REUS W/TWL LRG LVL3 (GOWN DISPOSABLE) ×4
GRAFT DERMACARRIERS 1 TO 1.5 (DISPOSABLE) ×1 IMPLANT
KIT BASIN OR (CUSTOM PROCEDURE TRAY) ×2 IMPLANT
KIT TURNOVER KIT B (KITS) ×2 IMPLANT
NS IRRIG 1000ML POUR BTL (IV SOLUTION) ×2 IMPLANT
PACK GENERAL/GYN (CUSTOM PROCEDURE TRAY) ×2 IMPLANT
PAD ABD 8X10 STRL (GAUZE/BANDAGES/DRESSINGS) ×1 IMPLANT
PAD ARMBOARD 7.5X6 YLW CONV (MISCELLANEOUS) ×4 IMPLANT
PADDING CAST COTTON 6X4 STRL (CAST SUPPLIES) IMPLANT
PIN SAFETY STERILE (MISCELLANEOUS) ×2 IMPLANT
STAPLER VISISTAT 35W (STAPLE) IMPLANT
STOCKINETTE IMPERVIOUS LG (DRAPES) ×2 IMPLANT
SUT CHROMIC 4 0 PS 2 18 (SUTURE) ×2 IMPLANT
SUT ETHILON 2 0 FS 18 (SUTURE) IMPLANT
SUT VIC AB 3-0 SH 8-18 (SUTURE) ×1 IMPLANT
SUT VIC AB 4-0 PS2 27 (SUTURE) IMPLANT
SUT VIC AB 5-0 P-3 18XBRD (SUTURE) IMPLANT
SUT VIC AB 5-0 P3 18 (SUTURE)
SWAB CULTURE ESWAB REG 1ML (MISCELLANEOUS) IMPLANT
SWAB CULTURE LIQUID MINI MALE (MISCELLANEOUS) IMPLANT
SYR CONTROL 10ML LL (SYRINGE) IMPLANT
TAPE CLOTH SURG 4X10 WHT LF (GAUZE/BANDAGES/DRESSINGS) IMPLANT
TAPE CLOTH SURG 6X10 WHT LF (GAUZE/BANDAGES/DRESSINGS) ×1 IMPLANT
TOWEL OR 17X24 6PK STRL BLUE (TOWEL DISPOSABLE) ×2 IMPLANT
TOWEL OR 17X26 10 PK STRL BLUE (TOWEL DISPOSABLE) ×2 IMPLANT
UNDERPAD 30X30 (UNDERPADS AND DIAPERS) ×2 IMPLANT

## 2019-04-10 NOTE — Anesthesia Postprocedure Evaluation (Signed)
Anesthesia Post Note  Patient: Walter Rodriguez  Procedure(s) Performed: SPLIT THICKNESS SKIN GRAFT FROM RIGHT  THIGH TO THE BACK (N/A )     Patient location during evaluation: PACU Anesthesia Type: General Level of consciousness: awake and alert Pain management: pain level controlled Vital Signs Assessment: post-procedure vital signs reviewed and stable Respiratory status: spontaneous breathing, nonlabored ventilation and respiratory function stable Cardiovascular status: blood pressure returned to baseline and stable Postop Assessment: no apparent nausea or vomiting Anesthetic complications: no    Last Vitals:  Vitals:   04/10/19 1025 04/10/19 1045  BP: 129/68 134/70  Pulse: 70 66  Resp: 20 16  Temp:  (!) 36.3 C  SpO2: 100% 99%    Last Pain:  Vitals:   04/10/19 1045  TempSrc:   PainSc: 0-No pain                 Cyndy Braver,W. EDMOND

## 2019-04-10 NOTE — Anesthesia Preprocedure Evaluation (Addendum)
Anesthesia Evaluation  Patient identified by MRN, date of birth, ID band Patient awake    Reviewed: Allergy & Precautions, H&P , NPO status , Patient's Chart, lab work & pertinent test results  Airway Mallampati: II  TM Distance: >3 FB Neck ROM: Full    Dental no notable dental hx. (+) Teeth Intact, Dental Advisory Given   Pulmonary neg pulmonary ROS,    Pulmonary exam normal breath sounds clear to auscultation       Cardiovascular negative cardio ROS   Rhythm:Regular Rate:Normal     Neuro/Psych negative neurological ROS  negative psych ROS   GI/Hepatic negative GI ROS, Neg liver ROS,   Endo/Other  negative endocrine ROS  Renal/GU negative Renal ROS  negative genitourinary   Musculoskeletal   Abdominal   Peds  Hematology negative hematology ROS (+)   Anesthesia Other Findings   Reproductive/Obstetrics negative OB ROS                            Anesthesia Physical Anesthesia Plan  ASA: II  Anesthesia Plan: General   Post-op Pain Management:    Induction: Intravenous  PONV Risk Score and Plan: 3 and Ondansetron, Dexamethasone and Midazolam  Airway Management Planned: Oral ETT  Additional Equipment:   Intra-op Plan:   Post-operative Plan: Extubation in OR  Informed Consent: I have reviewed the patients History and Physical, chart, labs and discussed the procedure including the risks, benefits and alternatives for the proposed anesthesia with the patient or authorized representative who has indicated his/her understanding and acceptance.     Dental advisory given  Plan Discussed with: CRNA  Anesthesia Plan Comments:         Anesthesia Quick Evaluation

## 2019-04-10 NOTE — Anesthesia Procedure Notes (Signed)
Procedure Name: Intubation Date/Time: 04/10/2019 8:58 AM Performed by: Bryson Corona, CRNA Pre-anesthesia Checklist: Patient identified, Emergency Drugs available, Suction available and Patient being monitored Patient Re-evaluated:Patient Re-evaluated prior to induction Oxygen Delivery Method: Circle System Utilized Preoxygenation: Pre-oxygenation with 100% oxygen Induction Type: IV induction and Rapid sequence Laryngoscope Size: Mac and 4 Grade View: Grade I Tube type: Oral Tube size: 7.0 mm Number of attempts: 1 Airway Equipment and Method: Stylet and Oral airway Placement Confirmation: ETT inserted through vocal cords under direct vision,  positive ETCO2 and breath sounds checked- equal and bilateral Secured at: 22 cm Tube secured with: Tape Dental Injury: Teeth and Oropharynx as per pre-operative assessment

## 2019-04-10 NOTE — Interval H&P Note (Signed)
History and Physical Interval Note:  04/10/2019 7:21 AM  Final pathology resection with clear margins, negative LN.   Walter Rodriguez  has presented today for surgery, with the diagnosis of MELANOMA BACK.  The various methods of treatment have been discussed with the patient and family. After consideration of risks, benefits and other options for treatment, the patient has consented to  Procedure(s): SPLIT THICKNESS SKIN GRAFT FROM RIGHT OR LEFT THIGH TO THE BACK (N/A) as a surgical intervention.  The patient's history has been reviewed, patient examined, no change in status, stable for surgery.  I have reviewed the patient's chart and labs.  Questions were answered to the patient's satisfaction.     Walter Rodriguez

## 2019-04-10 NOTE — Op Note (Signed)
Operative Note   DATE OF OPERATION: 5.1.20  LOCATION: [facility]-outpatient  SURGICAL DIVISION: Plastic Surgery  PREOPERATIVE DIAGNOSES:  1. S/p excision melanoma back 2. Open wound back to muscle  POSTOPERATIVE DIAGNOSES:  same  PROCEDURE:  1. Surgical preparation for grafting 96 cm2 2. Split thickness skin graft from right thigh to back 96 cm2  SURGEON: Irene Limbo MD MBA  ASSISTANT: Spero Curb RNFA  ANESTHESIA:  General.   EBL: 75 ml  COMPLICATIONS: None immediate.   INDICATIONS FOR PROCEDURE:  The patient, Walter Rodriguez, is a 63 y.o. male born on [birth year], is here for coverage back wound following wide excision melanoma. Pathology from resection with clear margins.   FINDINGS: Open wound central upper back 8 x 12 x 1 cm with base wound muscle, 95% granulated.   DESCRIPTION OF PROCEDURE:  The patient's operative site was marked with the patient in the preoperative area. The patient was taken to the operating room. SCDs were placed and IV antibiotics were given. Patient placed in left lateral position. The patient's operative site was prepped and draped in a sterile fashion. A time out was performed and all information was confirmed to be correct. Sharp excision of slough back wound completed with scissors followed by curettage wound, 96 cm2. Single bleeding vessel controlled with suture ligature. Wound irrigated. Split thickness skin graft harvested from right thigh at 12/1000th inch and meshed in 1:1.5 ratio and applied to back wound. This was secured with 4-0 chromic to skin, total 96 cm2. Adaptic and VAC sponge secured as bolster. VAC set to 125 mm Hg continuous.  Right thigh dressed with Xeroform followed by dry dressing and ACE wrap. Patient returned to supine position.   The patient was allowed to wake from anesthesia, extubated and taken to the recovery room in satisfactory condition.   SPECIMENS: none  DRAINS: none  Irene Limbo, MD [facility] [phone], pin [phone]

## 2019-04-10 NOTE — Transfer of Care (Signed)
Immediate Anesthesia Transfer of Care Note  Patient: Walter Rodriguez  Procedure(s) Performed: SPLIT THICKNESS SKIN GRAFT FROM RIGHT  THIGH TO THE BACK (N/A )  Patient Location: PACU  Anesthesia Type:General  Level of Consciousness: awake and alert   Airway & Oxygen Therapy: Patient Spontanous Breathing and Patient connected to face mask oxygen  Post-op Assessment: Report given to RN and Post -op Vital signs reviewed and stable  Post vital signs: Reviewed and stable  Last Vitals:  Vitals Value Taken Time  BP 130/87 04/10/2019 10:10 AM  Temp    Pulse 92 04/10/2019 10:10 AM  Resp 20 04/10/2019 10:10 AM  SpO2 100 % 04/10/2019 10:10 AM  Vitals shown include unvalidated device data.  Last Pain:  Vitals:   04/10/19 0657  TempSrc:   PainSc: 0-No pain         Complications: No apparent anesthesia complications

## 2019-04-11 ENCOUNTER — Encounter (HOSPITAL_COMMUNITY): Payer: Self-pay | Admitting: Plastic Surgery

## 2019-05-01 ENCOUNTER — Other Ambulatory Visit (HOSPITAL_COMMUNITY)
Admission: RE | Admit: 2019-05-01 | Discharge: 2019-05-01 | Disposition: A | Discharge status: Home / Self Care | Payer: Medicare Other | Source: Ambulatory Visit | Attending: General Surgery | Admitting: General Surgery

## 2019-05-01 DIAGNOSIS — C4359 Malignant melanoma of other part of trunk: Secondary | ICD-10-CM | POA: Insufficient documentation

## 2019-05-19 ENCOUNTER — Encounter (HOSPITAL_COMMUNITY): Payer: Self-pay | Admitting: General Surgery

## 2019-05-19 ENCOUNTER — Other Ambulatory Visit: Payer: Self-pay

## 2019-05-19 ENCOUNTER — Inpatient Hospital Stay: Payer: Medicare Other | Attending: Oncology | Admitting: Oncology

## 2019-05-19 ENCOUNTER — Other Ambulatory Visit: Payer: Self-pay | Admitting: Oncology

## 2019-05-19 VITALS — BP 138/71 | HR 68 | Temp 98.2°F | Resp 17 | Ht 70.0 in | Wt 177.3 lb

## 2019-05-19 DIAGNOSIS — C439 Malignant melanoma of skin, unspecified: Secondary | ICD-10-CM

## 2019-05-19 DIAGNOSIS — Z79899 Other long term (current) drug therapy: Secondary | ICD-10-CM | POA: Insufficient documentation

## 2019-05-19 DIAGNOSIS — C4359 Malignant melanoma of other part of trunk: Secondary | ICD-10-CM | POA: Diagnosis not present

## 2019-05-19 NOTE — Progress Notes (Signed)
Hematology and Oncology Follow Up Visit  Walter Rodriguez [id number] 1956-02-05 63 y.o. 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  This was diagnosed in March 2015.   Prior Therapy:  He is status post local wide excision as well as right axillary sentinel lymph node sampling completed by Dr. Barry Dienes on April 01, 2019.  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 Iran Planas on Apr 10, 2019.  Current therapy: Active surveillance.  Interim History: Walter Rodriguez presents today for a follow-up visit.  Since the last visit, he completed surgical resection followed by skin graft completed on April 22 and May 1 respectively.  Since that time, he reports of feeling reasonably well and fully recovered from his operation.  He denies any recent hospitalization or illnesses.  He denies any weakness or fatigue.  He denies any skin rashes or lesions.  He does not report any headaches, blurry vision, syncope or seizures. Does not report any fevers, chills or sweats.  Does not report any cough, wheezing or hemoptysis.  Does not report any chest pain, palpitation, orthopnea or leg edema.  Does not report any nausea, vomiting or abdominal pain.  Does not report any constipation or diarrhea.  Does not report any skeletal complaints.    Does not report frequency, urgency or hematuria.  Does not report any skin rashes or lesions. Does not report any heat or cold intolerance.  Does not report any lymphadenopathy or petechiae.  Does not report any anxiety or depression.  Remaining review of systems is negative.    Medications: I have reviewed the patient's current medications.  Current Outpatient Medications  Medication Sig Dispense Refill  . Gauze Pads & Dressings (ABDOMINAL PAD)  8"X10" PADS 1 Units by Does not apply route daily. 10 each 7  . Gauze Pads & Dressings (KERLIX BANDAGE ROLL 4.5"X9.3') MISC 1 application by Does not apply route daily. 7 each 7  . Multiple Vitamin (MULTIVITAMIN WITH MINERALS) TABS tablet Take 1 tablet by mouth daily with breakfast. Centrum Silver Men's 50+    . oxyCODONE (OXY IR/ROXICODONE) 5 MG immediate release tablet Take 1-2 tablets (5-10 mg total) by mouth every 6 (six) hours as needed for severe pain. 30 tablet 0  . SODIUM CHLORIDE, EXTERNAL, (SALINE WOUND [place]) 0.9 % SOLN Apply 1 application topically daily. 1 Can 4   No current facility-administered medications for this visit.      Allergies: No Known Allergies  Past Medical History, Surgical history, Social history, and Family History were reviewed and updated.    Physical Exam: Blood pressure 138/71, pulse 68, temperature 98.2 F (36.8 C), temperature source Oral, resp. rate 17, height 5\' 10"  (1.778 m), weight 177 lb 4.8 oz (80.4 kg), SpO2 99 %.   ECOG: 0 General appearance: alert and cooperative appeared without distress. Head: Normocephalic, without obvious abnormality Oropharynx: No oral thrush or ulcers. Eyes: No scleral icterus.  Pupils are equal and round reactive to light. Lymph nodes: Cervical, supraclavicular, and axillary nodes normal. Heart:regular rate and rhythm, S1, S2 normal, no murmur, click, rub or gallop Lung:chest clear, no wheezing, rales, normal symmetric air entry Abdomin: soft, non-tender, without masses or organomegaly. Neurological: No motor, sensory deficits.  Intact deep tendon reflexes. Skin: Well-healed scar noted on his upper back.  No erythema or induration. Musculoskeletal: No joint deformity or effusion.     Lab Results: Lab Results  Component Value Date   WBC 11.8 (H) 04/01/2019   HGB 12.0 (L) 04/01/2019   HCT 38.5 (L) 04/01/2019   MCV 79.5 (L) 04/01/2019   PLT 390 04/01/2019     Chemistry      Component Value Date/Time    NA 137 04/01/2019 1050   K 4.0 04/01/2019 1050   CL 104 04/01/2019 1050   CO2 21 (L) 04/01/2019 1050   BUN 13 04/01/2019 1050   CREATININE 1.16 04/01/2019 1050      Component Value Date/Time   CALCIUM 8.9 04/01/2019 1050   ALKPHOS 43 04/01/2019 1050   AST 17 04/01/2019 1050   ALT 14 04/01/2019 1050   BILITOT 0.8 04/01/2019 1050        Impression and Plan:   63 year old with:  1.  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   Is status post wide excision and sentinel lymph node sampling on April 01, 2019 with a final pathology showed T4BN0 indicating stage IIC.  The natural course of this disease was reviewed today and treatment options were discussed.  These options would include adjuvant high-dose interferon, active surveillance or clinical trial enrollment.  After discussion today, we opted to continue with active surveillance at this time and defer the use of systemic therapy if he develops advanced disease.  He understands he has high risk disease given his stage and close surveillance will be required.    The logistics of administration of immunotherapy were reviewed today in case he would require it in the future.  Complication as well as success rates were also reiterated.  We will have repeat imaging studies in September 2020.  2.  Dermatology surveillance: I urged him to make an appointment for skin examination which will be done periodically.   3.  Follow-up: We will be in 3 months for repeat imaging studies.    25  minutes was spent with the patient face-to-face today.  More than 50% of time was spent on reviewing his disease status update, pathology report to review, treatment options and answering questions regarding future plan of care.      Zola Button, MD 6/9/202010:08 AM

## 2019-07-29 ENCOUNTER — Telehealth: Payer: Self-pay | Admitting: Oncology

## 2019-07-29 NOTE — Telephone Encounter (Signed)
Faxed office note to [facility] [phone]

## 2019-07-30 ENCOUNTER — Telehealth: Payer: Self-pay | Admitting: Oncology

## 2019-07-30 NOTE — Telephone Encounter (Signed)
Scheduled appt per 8/20 sch message - pt is aware of appt date and time   

## 2019-08-14 ENCOUNTER — Inpatient Hospital Stay: Payer: Medicare Other | Attending: Oncology

## 2019-08-14 ENCOUNTER — Other Ambulatory Visit: Payer: Self-pay

## 2019-08-14 DIAGNOSIS — C4359 Malignant melanoma of other part of trunk: Secondary | ICD-10-CM | POA: Insufficient documentation

## 2019-08-14 DIAGNOSIS — C439 Malignant melanoma of skin, unspecified: Secondary | ICD-10-CM

## 2019-08-14 LAB — CBC WITH DIFFERENTIAL (CANCER CENTER ONLY)
Abs Immature Granulocytes: 0.03 10*3/uL (ref 0.00–0.07)
Basophils Absolute: 0.1 10*3/uL (ref 0.0–0.1)
Basophils Relative: 1 %
Eosinophils Absolute: 0.2 10*3/uL (ref 0.0–0.5)
Eosinophils Relative: 2 %
HCT: 43 % (ref 39.0–52.0)
Hemoglobin: 13.7 g/dL (ref 13.0–17.0)
Immature Granulocytes: 0 %
Lymphocytes Relative: 25 %
Lymphs Abs: 2.2 10*3/uL (ref 0.7–4.0)
MCH: 25 pg — ABNORMAL LOW (ref 26.0–34.0)
MCHC: 31.9 g/dL (ref 30.0–36.0)
MCV: 78.6 fL — ABNORMAL LOW (ref 80.0–100.0)
Monocytes Absolute: 0.4 10*3/uL (ref 0.1–1.0)
Monocytes Relative: 5 %
Neutro Abs: 5.7 10*3/uL (ref 1.7–7.7)
Neutrophils Relative %: 67 %
Platelet Count: 273 10*3/uL (ref 150–400)
RBC: 5.47 MIL/uL (ref 4.22–5.81)
RDW: 15.8 % — ABNORMAL HIGH (ref 11.5–15.5)
WBC Count: 8.6 10*3/uL (ref 4.0–10.5)
nRBC: 0 % (ref 0.0–0.2)

## 2019-08-14 LAB — CMP (CANCER CENTER ONLY)
ALT: 17 U/L (ref 0–44)
AST: 20 U/L (ref 15–41)
Albumin: 4 g/dL (ref 3.5–5.0)
Alkaline Phosphatase: 59 U/L (ref 38–126)
Anion gap: 12 (ref 5–15)
BUN: 14 mg/dL (ref 8–23)
CO2: 23 mmol/L (ref 22–32)
Calcium: 9.1 mg/dL (ref 8.9–10.3)
Chloride: 106 mmol/L (ref 98–111)
Creatinine: 1.19 mg/dL (ref 0.61–1.24)
GFR, Est AFR Am: >60 mL/min (ref >60)
GFR, Estimated: >60 mL/min (ref >60)
Glucose, Bld: 97 mg/dL (ref 70–99)
Potassium: 4.1 mmol/L (ref 3.5–5.1)
Sodium: 141 mmol/L (ref 135–145)
Total Bilirubin: 0.5 mg/dL (ref 0.3–1.2)
Total Protein: 7.1 g/dL (ref 6.5–8.1)

## 2019-08-18 ENCOUNTER — Encounter (HOSPITAL_COMMUNITY): Payer: Self-pay

## 2019-08-18 ENCOUNTER — Other Ambulatory Visit: Payer: Self-pay

## 2019-08-18 ENCOUNTER — Ambulatory Visit (HOSPITAL_COMMUNITY)
Admission: RE | Admit: 2019-08-18 | Discharge: 2019-08-18 | Disposition: A | Discharge status: Home / Self Care | Payer: Medicare Other | Source: Ambulatory Visit | Attending: Oncology | Admitting: Oncology

## 2019-08-18 DIAGNOSIS — C439 Malignant melanoma of skin, unspecified: Secondary | ICD-10-CM | POA: Insufficient documentation

## 2019-08-18 IMAGING — CT CT ABD-PELV W/ CM
2 of 5 series · 12 of 36 positions shown, 15 images · IV contrast (OMNIPAQUE)
Comparison: None.

CLINICAL DATA: Follow-up melanoma

EXAM:
CT CHEST, ABDOMEN, AND PELVIS WITH CONTRAST
TECHNIQUE: Multidetector CT imaging of the chest, abdomen and pelvis was
performed following the standard protocol during bolus
administration of intravenous contrast.
CONTRAST:  100mL OMNIPAQUE IOHEXOL 300 MG/ML SOLN, additional oral
enteric contrast was administered

[Series 2: cap with · axial · 0.88mm/px · z∈[+1245,+1775]mm · 9 of 134 slices shown, 12 images]
[im 14/134  mediastinal]
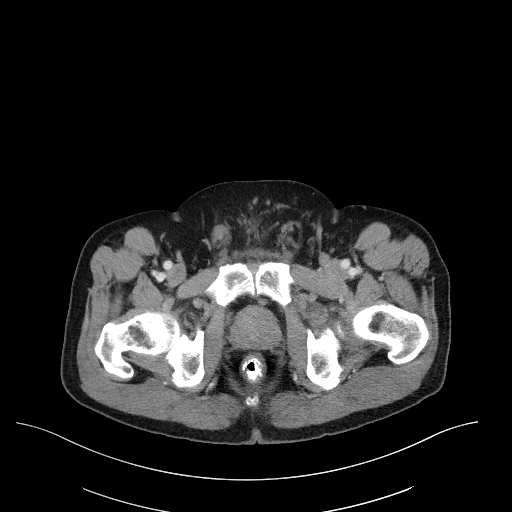
[im 14/134  lung]
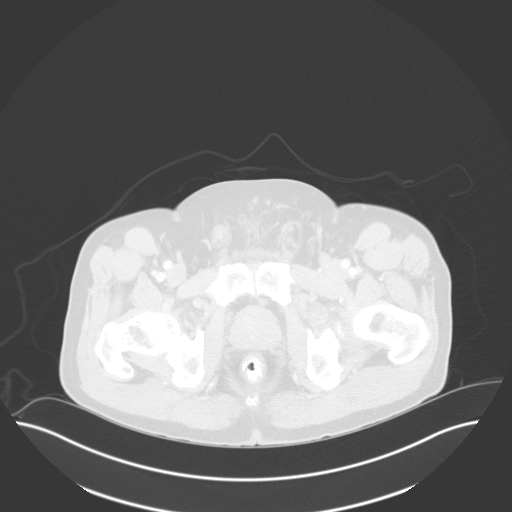
[im 27/134  lung]
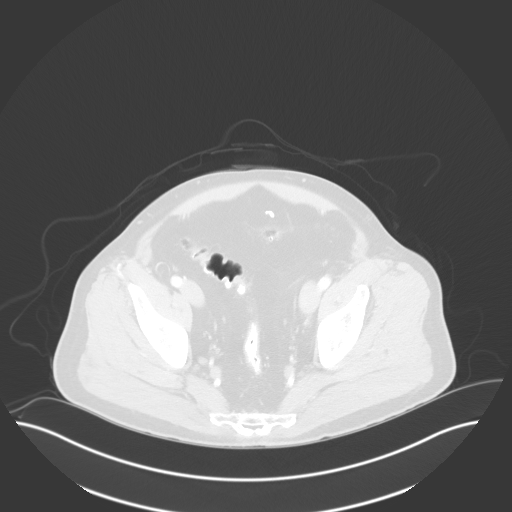
[im 40/134  lung]
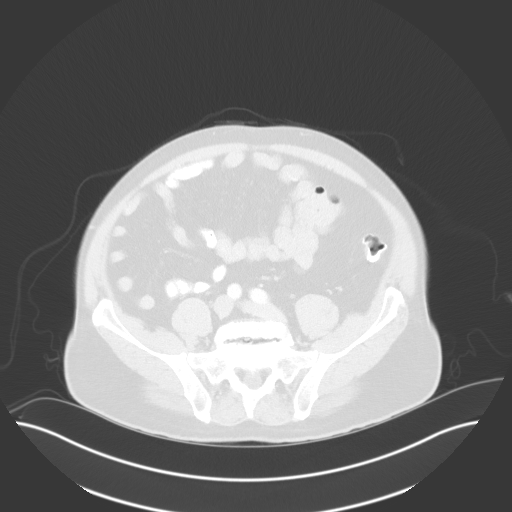
[im 54/134  lung]
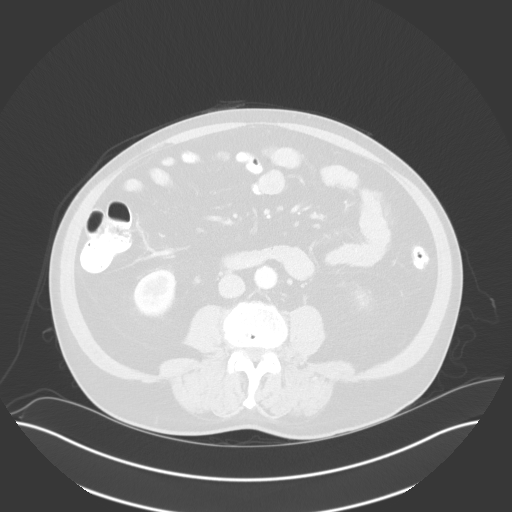
[im 67/134  mediastinal]
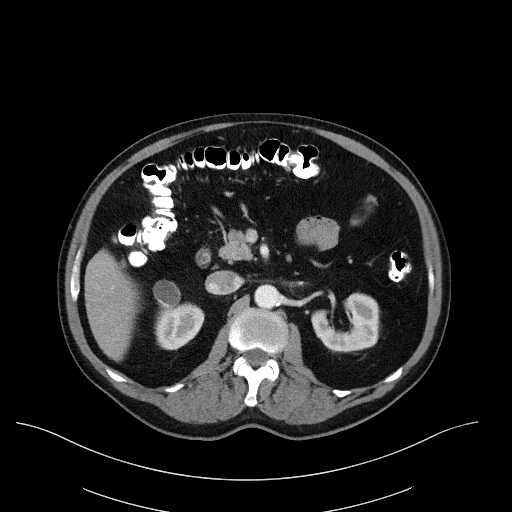
[im 67/134  lung]
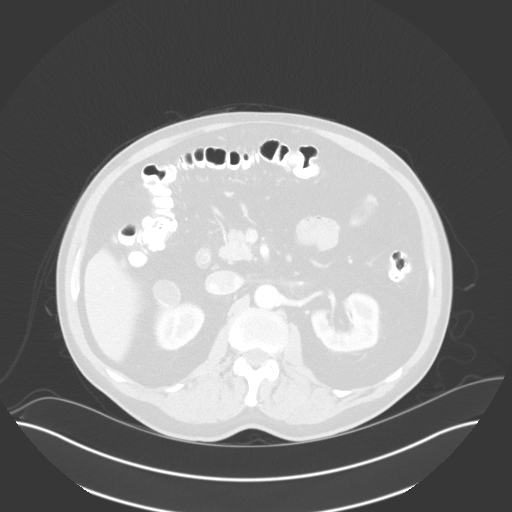
[im 80/134  lung]
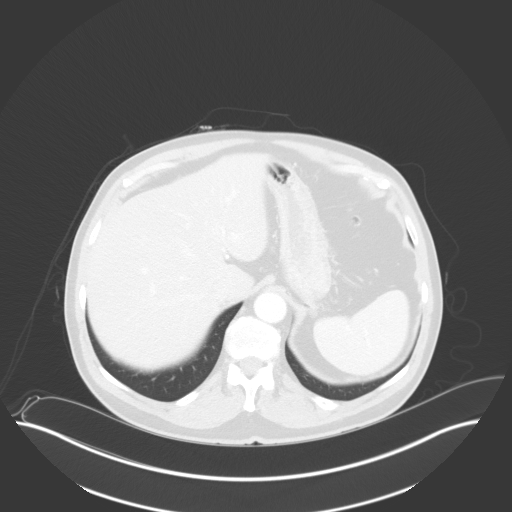
[im 94/134  lung]
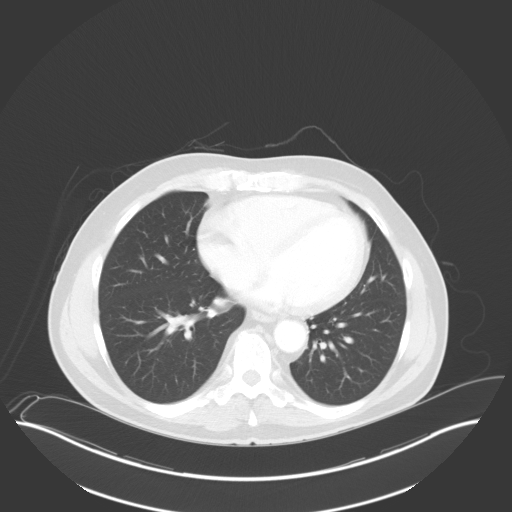
[im 107/134  lung]
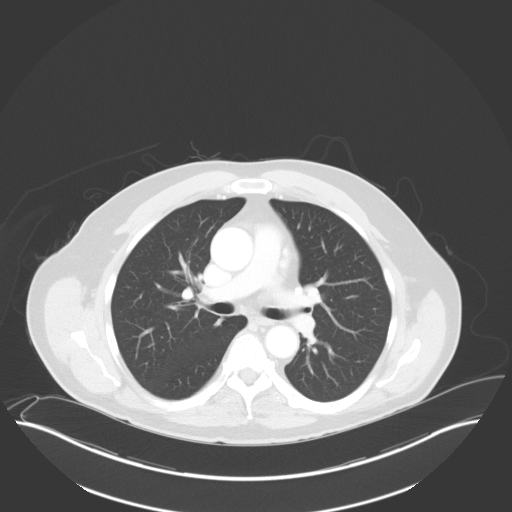
[im 120/134  mediastinal]
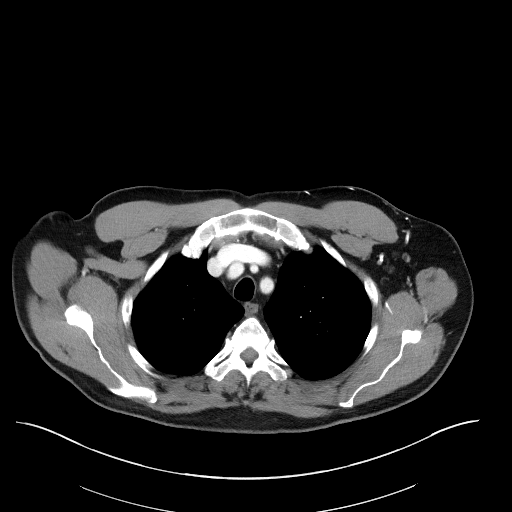
[im 120/134  lung]
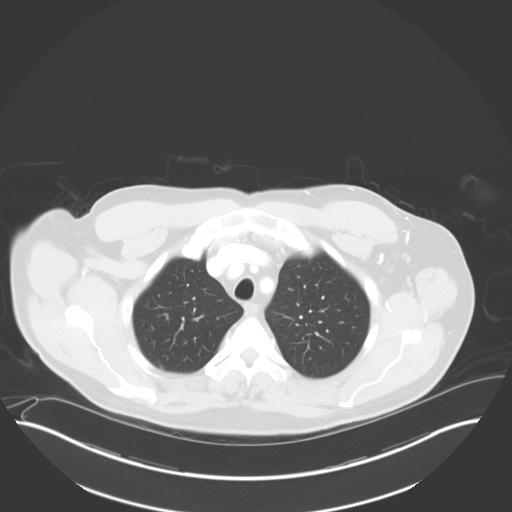

[Series 4: coronals · coronal · 1.10mm/px · 3 of 138 slices shown]
[im 28/138  lung]
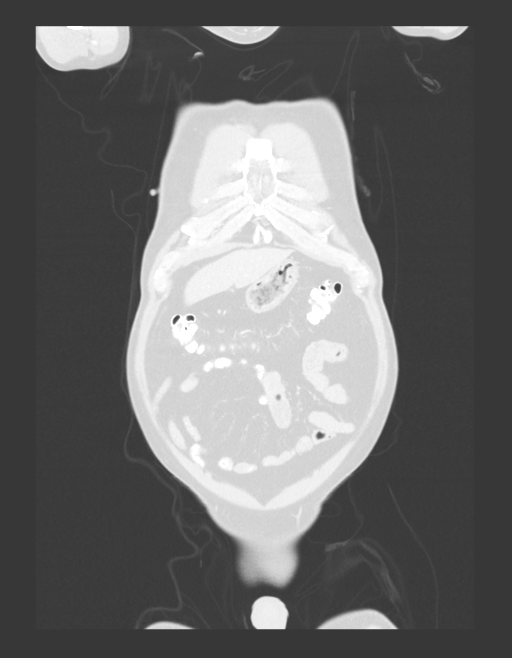
[im 55/138  lung]
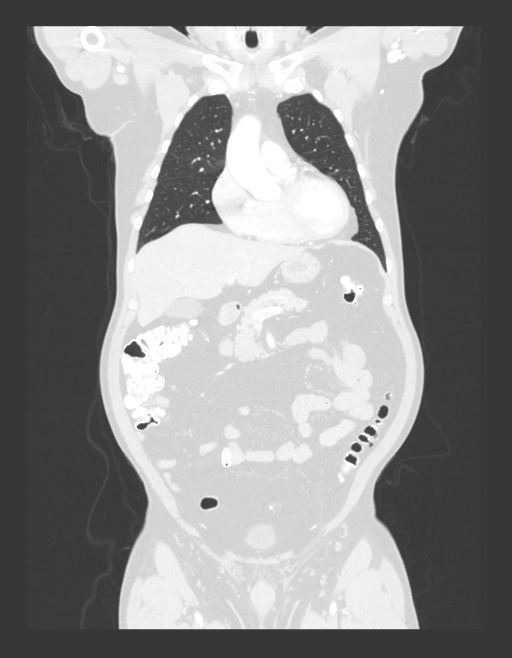
[im 83/138  lung]
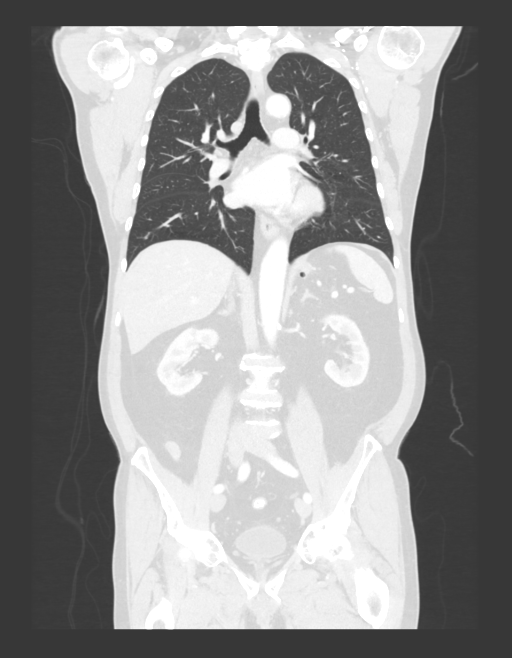

[12 of 36 positions shown; findings below may reference images not displayed]

FINDINGS: CT CHEST FINDINGS

Cardiovascular: No significant vascular findings. Normal heart size.
No pericardial effusion.

Mediastinum/Nodes: No enlarged mediastinal, hilar, or axillary lymph
nodes. Thyroid gland, trachea, and esophagus demonstrate no
significant findings.

Lungs/Pleura: Lungs are clear. No pleural effusion or pneumothorax.

Musculoskeletal: No chest wall mass or suspicious bone lesions
identified. Postoperative findings and surgical clips in the right
axilla in keeping with sentinel lymph node dissection.

CT ABDOMEN PELVIS FINDINGS

Hepatobiliary: No solid liver abnormality is seen. No gallstones,
gallbladder wall thickening, or biliary dilatation.

Pancreas: Unremarkable. No pancreatic ductal dilatation or
surrounding inflammatory changes.

Spleen: Normal in size without significant abnormality. Incidental
note of a tiny accessory splenule.

Adrenals/Urinary Tract: Adrenal glands are unremarkable. Kidneys are
normal, without renal calculi, solid lesion, or hydronephrosis.
Bladder is unremarkable.

Stomach/Bowel: Stomach is within normal limits. Small incidental
diverticulum of the ascending portion of the duodenum. Appendix
appears normal. No evidence of bowel wall thickening, distention, or
inflammatory changes. Occasional sigmoid diverticula.

Vascular/Lymphatic: Aortic atherosclerosis. No enlarged abdominal or
pelvic lymph nodes.

Reproductive: No mass or other abnormality.

Other: No abdominal wall hernia or abnormality. No abdominopelvic
ascites.

Musculoskeletal: No acute or significant osseous findings.
IMPRESSION: 1. No evidence of lymphadenopathy or metastatic disease in the
chest, abdomen, or pelvis.

2. Postoperative findings and surgical clips in the right axilla in
keeping with sentinel lymph node dissection.

3.  Other chronic and incidental findings as detailed above.

## 2019-08-18 IMAGING — CT CT CHEST W/ CM
2 of 5 series · 12 of 36 positions shown, 15 images · IV contrast (omnipaque)
Comparison: None.

CLINICAL DATA: Follow-up melanoma

EXAM:
CT CHEST, ABDOMEN, AND PELVIS WITH CONTRAST
TECHNIQUE: Multidetector CT imaging of the chest, abdomen and pelvis was
performed following the standard protocol during bolus
administration of intravenous contrast.
CONTRAST:  100mL OMNIPAQUE IOHEXOL 300 MG/ML SOLN, additional oral
enteric contrast was administered

[Series 2: cap with · axial · 0.88mm/px · z∈[+1245,+1775]mm · 9 of 134 slices shown, 12 images]
[im 14/134  mediastinal]
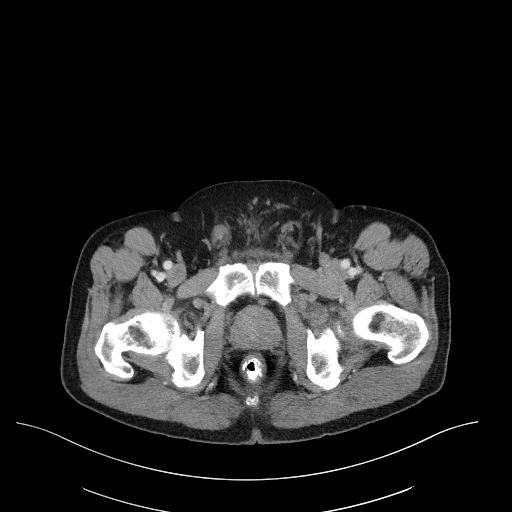
[im 14/134  lung]
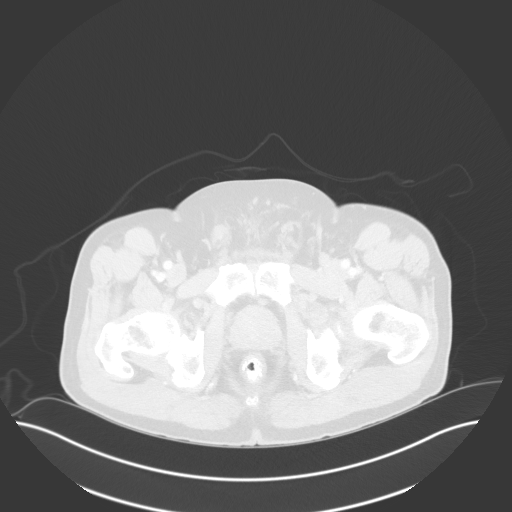
[im 27/134  lung]
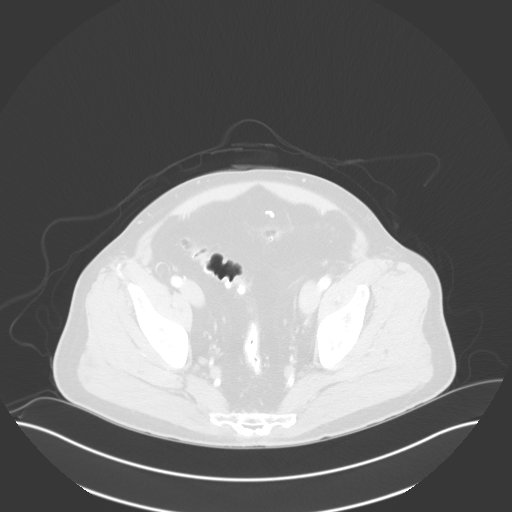
[im 40/134  lung]
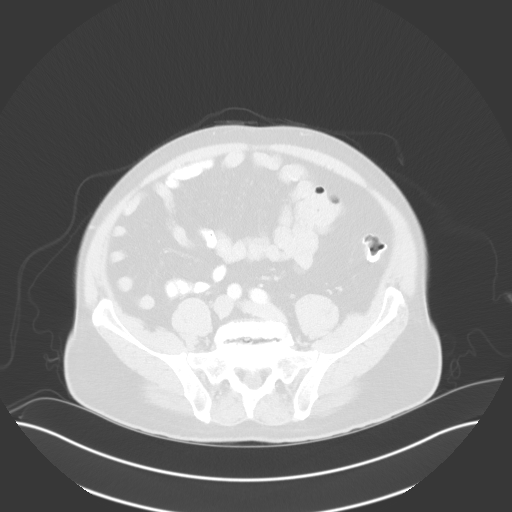
[im 54/134  lung]
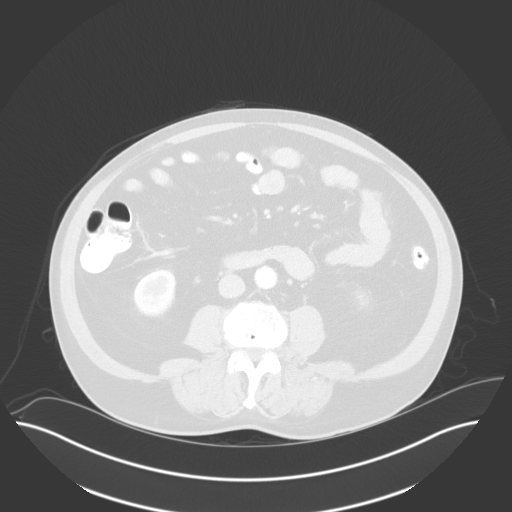
[im 67/134  mediastinal]
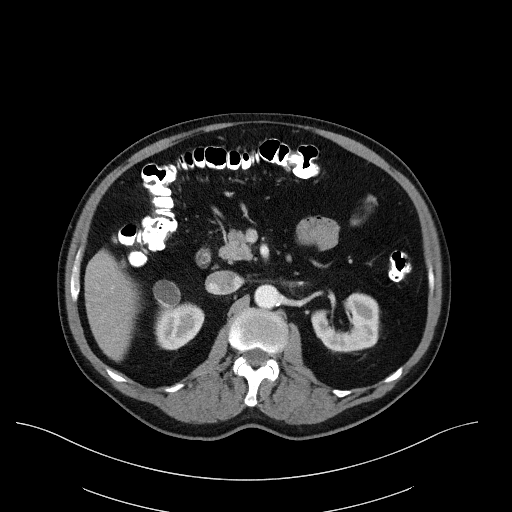
[im 67/134  lung]
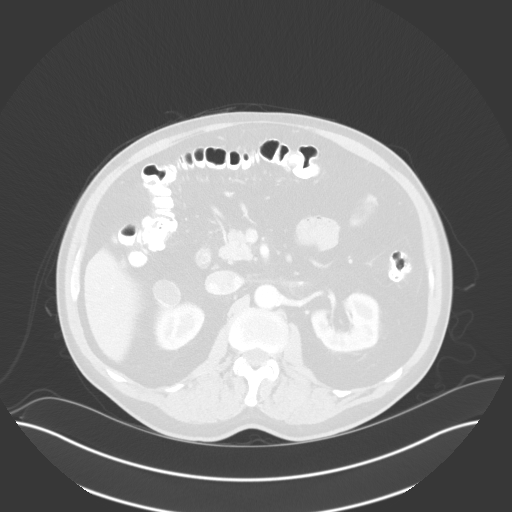
[im 80/134  lung]
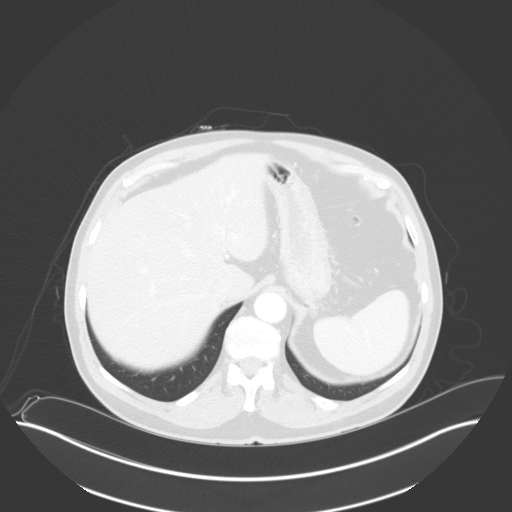
[im 94/134  lung]
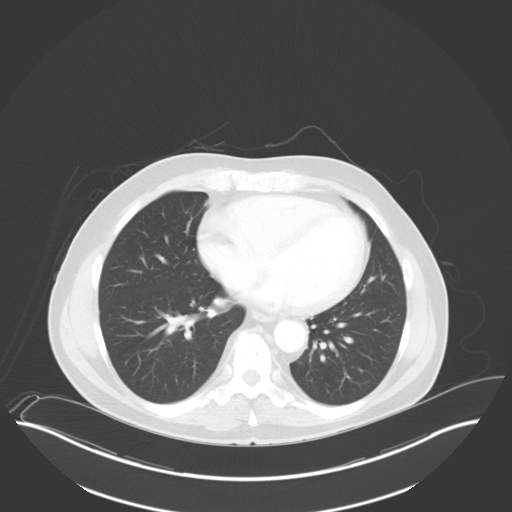
[im 107/134  lung]
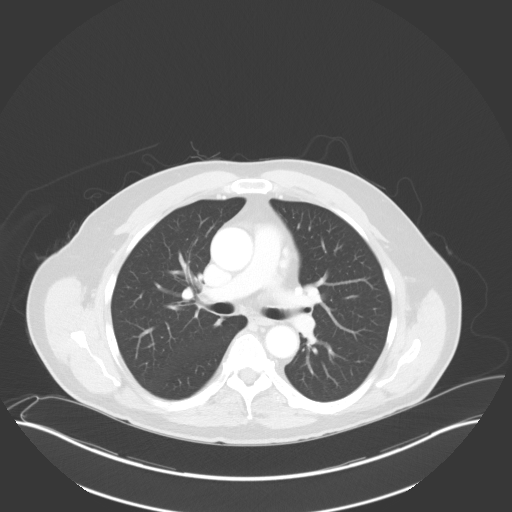
[im 120/134  mediastinal]
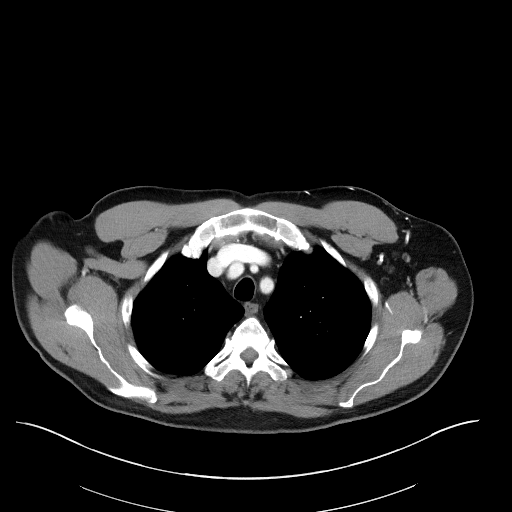
[im 120/134  lung]
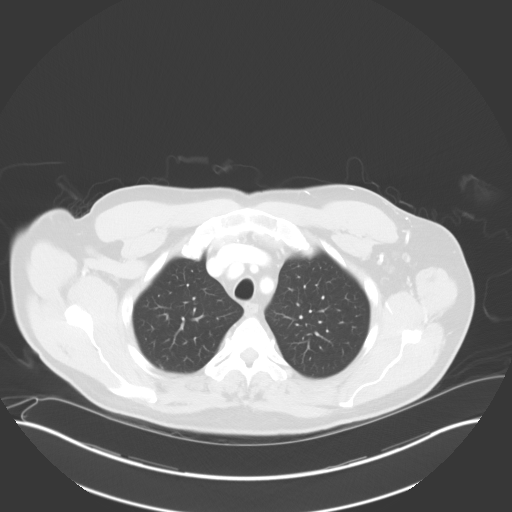

[Series 4: coronals · coronal · 1.10mm/px · 3 of 138 slices shown]
[im 28/138  lung]
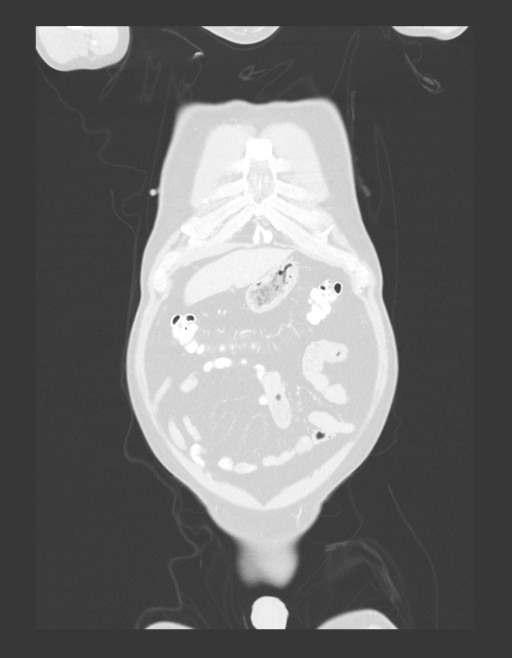
[im 55/138  lung]
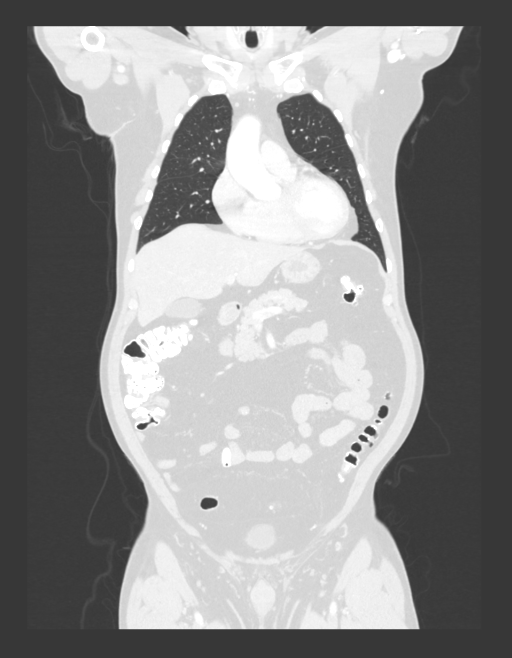
[im 83/138  lung]
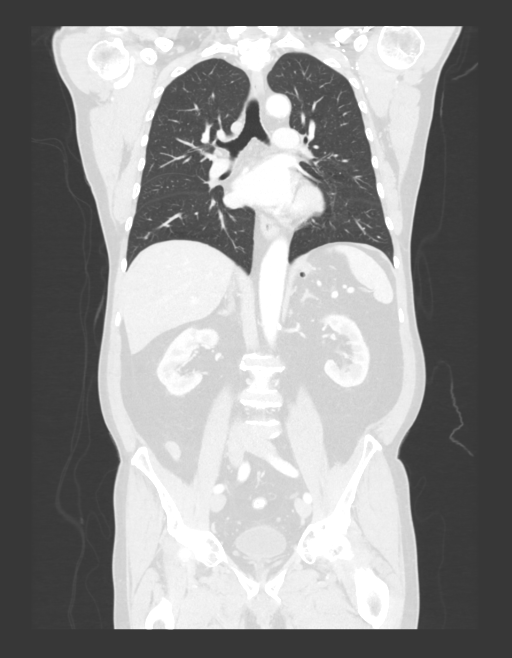

[12 of 36 positions shown; findings below may reference images not displayed]

FINDINGS: CT CHEST FINDINGS

Cardiovascular: No significant vascular findings. Normal heart size.
No pericardial effusion.

Mediastinum/Nodes: No enlarged mediastinal, hilar, or axillary lymph
nodes. Thyroid gland, trachea, and esophagus demonstrate no
significant findings.

Lungs/Pleura: Lungs are clear. No pleural effusion or pneumothorax.

Musculoskeletal: No chest wall mass or suspicious bone lesions
identified. Postoperative findings and surgical clips in the right
axilla in keeping with sentinel lymph node dissection.

CT ABDOMEN PELVIS FINDINGS

Hepatobiliary: No solid liver abnormality is seen. No gallstones,
gallbladder wall thickening, or biliary dilatation.

Pancreas: Unremarkable. No pancreatic ductal dilatation or
surrounding inflammatory changes.

Spleen: Normal in size without significant abnormality. Incidental
note of a tiny accessory splenule.

Adrenals/Urinary Tract: Adrenal glands are unremarkable. Kidneys are
normal, without renal calculi, solid lesion, or hydronephrosis.
Bladder is unremarkable.

Stomach/Bowel: Stomach is within normal limits. Small incidental
diverticulum of the ascending portion of the duodenum. Appendix
appears normal. No evidence of bowel wall thickening, distention, or
inflammatory changes. Occasional sigmoid diverticula.

Vascular/Lymphatic: Aortic atherosclerosis. No enlarged abdominal or
pelvic lymph nodes.

Reproductive: No mass or other abnormality.

Other: No abdominal wall hernia or abnormality. No abdominopelvic
ascites.

Musculoskeletal: No acute or significant osseous findings.
IMPRESSION: 1. No evidence of lymphadenopathy or metastatic disease in the
chest, abdomen, or pelvis.

2. Postoperative findings and surgical clips in the right axilla in
keeping with sentinel lymph node dissection.

3.  Other chronic and incidental findings as detailed above.

## 2019-08-18 MED ORDER — SODIUM CHLORIDE (PF) 0.9 % IJ SOLN
INTRAMUSCULAR | Status: AC
  Filled 2019-08-18: qty 50

## 2019-08-18 MED ORDER — IOHEXOL 300 MG/ML  SOLN
100.0000 mL | Freq: Once | INTRAMUSCULAR | Status: AC | PRN
  Administered 2019-08-18: 100 mL via INTRAVENOUS

## 2020-02-07 ENCOUNTER — Ambulatory Visit: Payer: Medicare Other | Attending: Internal Medicine

## 2020-02-07 DIAGNOSIS — Z23 Encounter for immunization: Secondary | ICD-10-CM

## 2020-02-07 NOTE — Progress Notes (Signed)
   Covid-19 Vaccination Clinic  Name:  SAMEH PERI    MRN: [id number] DOB: 01-22-1956  02/07/2020  Mr. Lamance was observed post Covid-19 immunization for 15 minutes without incidence. He was provided with Vaccine Information Sheet and instruction to access the V-Safe system.   Mr. Keitz was instructed to call 911 with any severe reactions post vaccine: Marland Kitchen Difficulty breathing  . Swelling of your face and throat  . A fast heartbeat  . A bad rash all over your body  . Dizziness and weakness    Immunizations Administered    Name Date Dose VIS Date Route   Pfizer COVID-19 Vaccine 02/07/2020  8:15 AM 0.3 mL 11/20/2019 Intramuscular   Manufacturer: [place]   Lot: [id number]   [place]: [id number]

## 2020-03-02 ENCOUNTER — Ambulatory Visit: Payer: Medicare Other | Attending: Internal Medicine

## 2020-03-02 DIAGNOSIS — Z23 Encounter for immunization: Secondary | ICD-10-CM

## 2020-03-02 NOTE — Progress Notes (Signed)
   Covid-19 Vaccination Clinic  Name:  YAQOUB CANDELARIA    MRN: [id number] DOB: [birth year]  03/02/2020  Mr. Lorenz was observed post Covid-19 immunization for 15 minutes without incident. He was provided with Vaccine Information Sheet and instruction to access the V-Safe system.   Mr. Batten was instructed to call 911 with any severe reactions post vaccine: Marland Kitchen Difficulty breathing  . Swelling of face and throat  . A fast heartbeat  . A bad rash all over body  . Dizziness and weakness   Immunizations Administered    Name Date Dose VIS Date Route   Pfizer COVID-19 Vaccine 03/02/2020  8:13 AM 0.3 mL 11/20/2019 Intramuscular   Manufacturer: [place]   Lot: [id number]   [place]: [id number]

## 2020-07-10 DIAGNOSIS — C7931 Secondary malignant neoplasm of brain: Secondary | ICD-10-CM

## 2020-07-10 HISTORY — DX: Secondary malignant neoplasm of brain: C79.31

## 2020-07-27 ENCOUNTER — Ambulatory Visit (INDEPENDENT_AMBULATORY_CARE_PROVIDER_SITE_OTHER): Payer: Medicare Other

## 2020-07-27 ENCOUNTER — Other Ambulatory Visit: Payer: Self-pay

## 2020-07-27 ENCOUNTER — Ambulatory Visit: Payer: Medicare Other | Admitting: Family Medicine

## 2020-07-27 ENCOUNTER — Encounter: Payer: Self-pay | Admitting: Family Medicine

## 2020-07-27 VITALS — BP 142/76 | HR 84 | Ht 70.0 in | Wt 171.2 lb

## 2020-07-27 DIAGNOSIS — Z85820 Personal history of malignant melanoma of skin: Secondary | ICD-10-CM | POA: Diagnosis not present

## 2020-07-27 DIAGNOSIS — R269 Unspecified abnormalities of gait and mobility: Secondary | ICD-10-CM

## 2020-07-27 DIAGNOSIS — R29898 Other symptoms and signs involving the musculoskeletal system: Secondary | ICD-10-CM

## 2020-07-27 DIAGNOSIS — R296 Repeated falls: Secondary | ICD-10-CM | POA: Diagnosis not present

## 2020-07-27 IMAGING — DX DG CERVICAL SPINE 2-3V CLEARING
3 series · 3 of 3 positions shown · non-contrast
Comparison: Neck CT [DATE].

CLINICAL DATA: 64-year-old male with weakness in legs. New abnormal
gait. Vertigo for 1 month. Melanoma.

EXAM:
LIMITED CERVICAL SPINE FOR TRAUMA CLEARING - 2-3 VIEW

[c-spine lat]
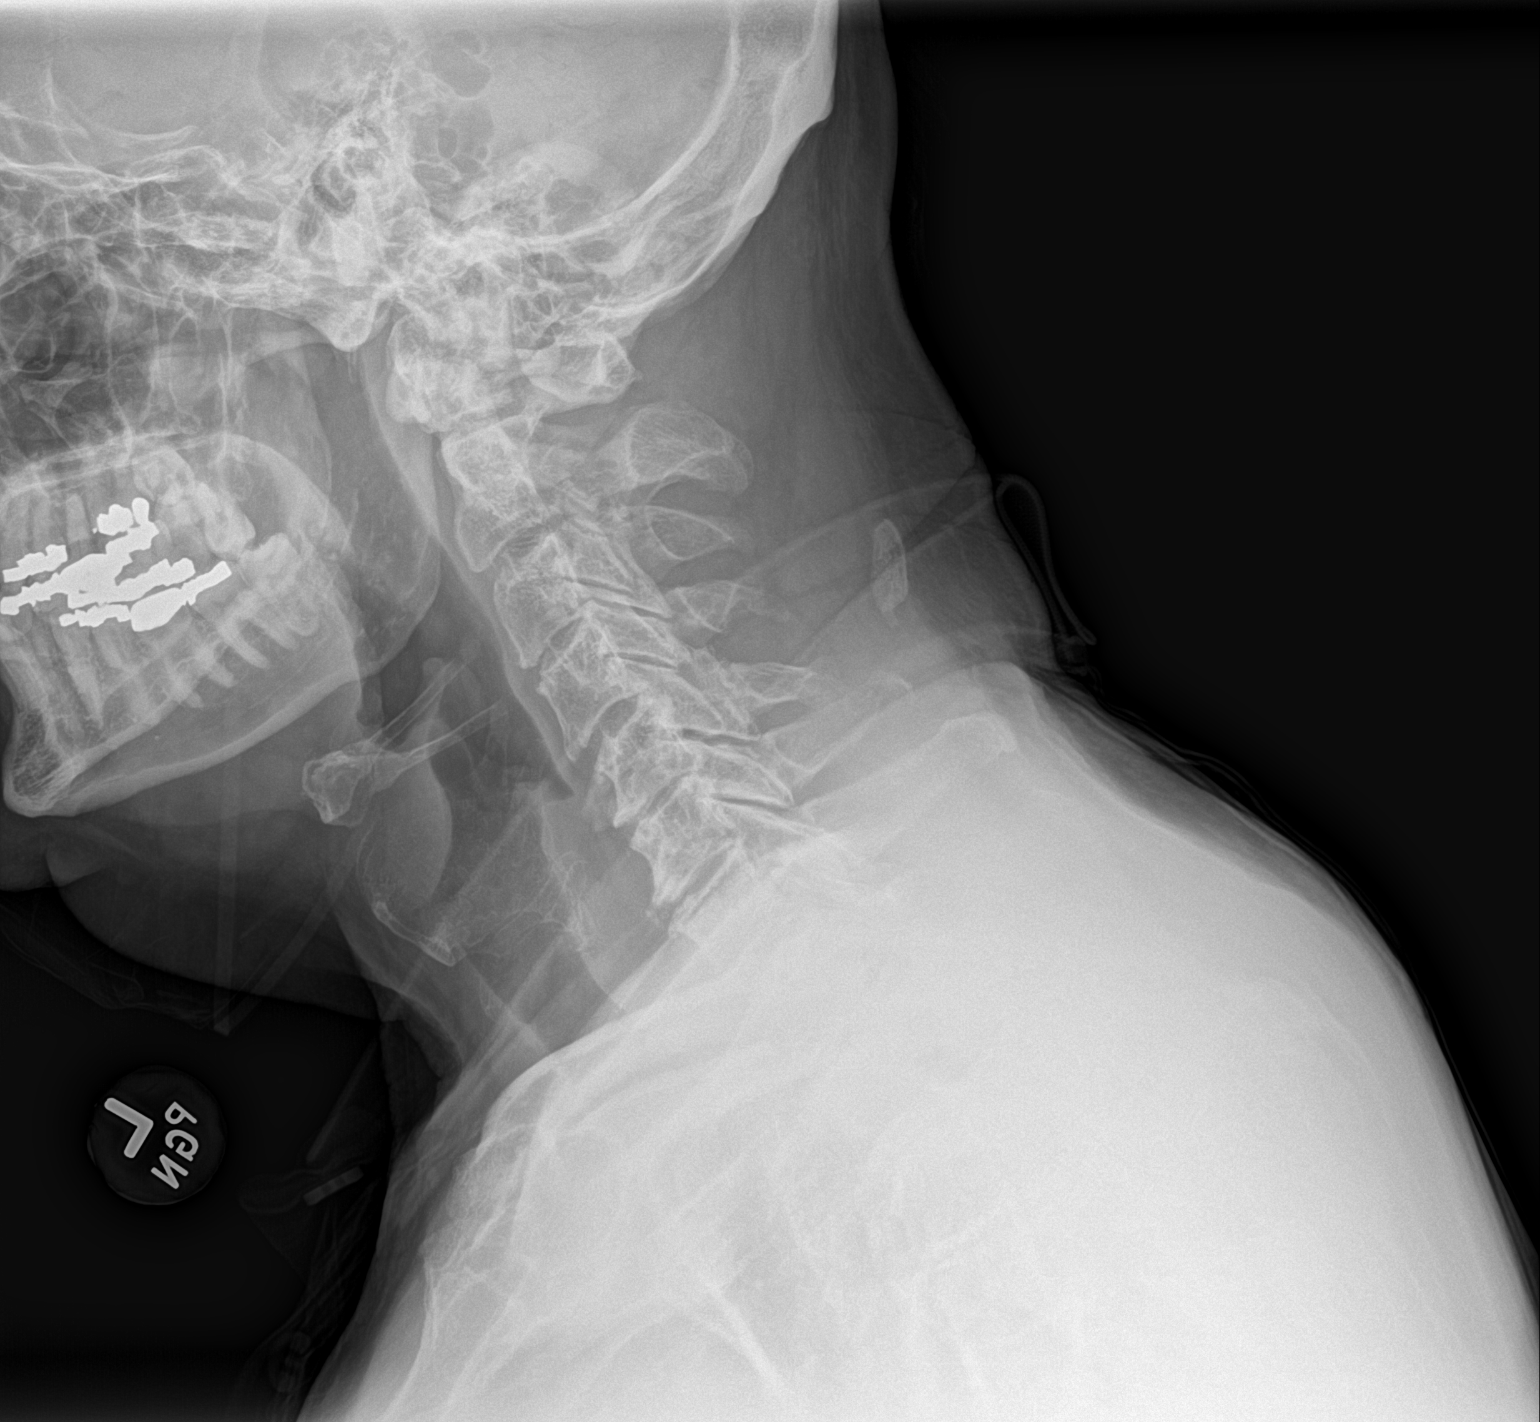

[c-spine ap]
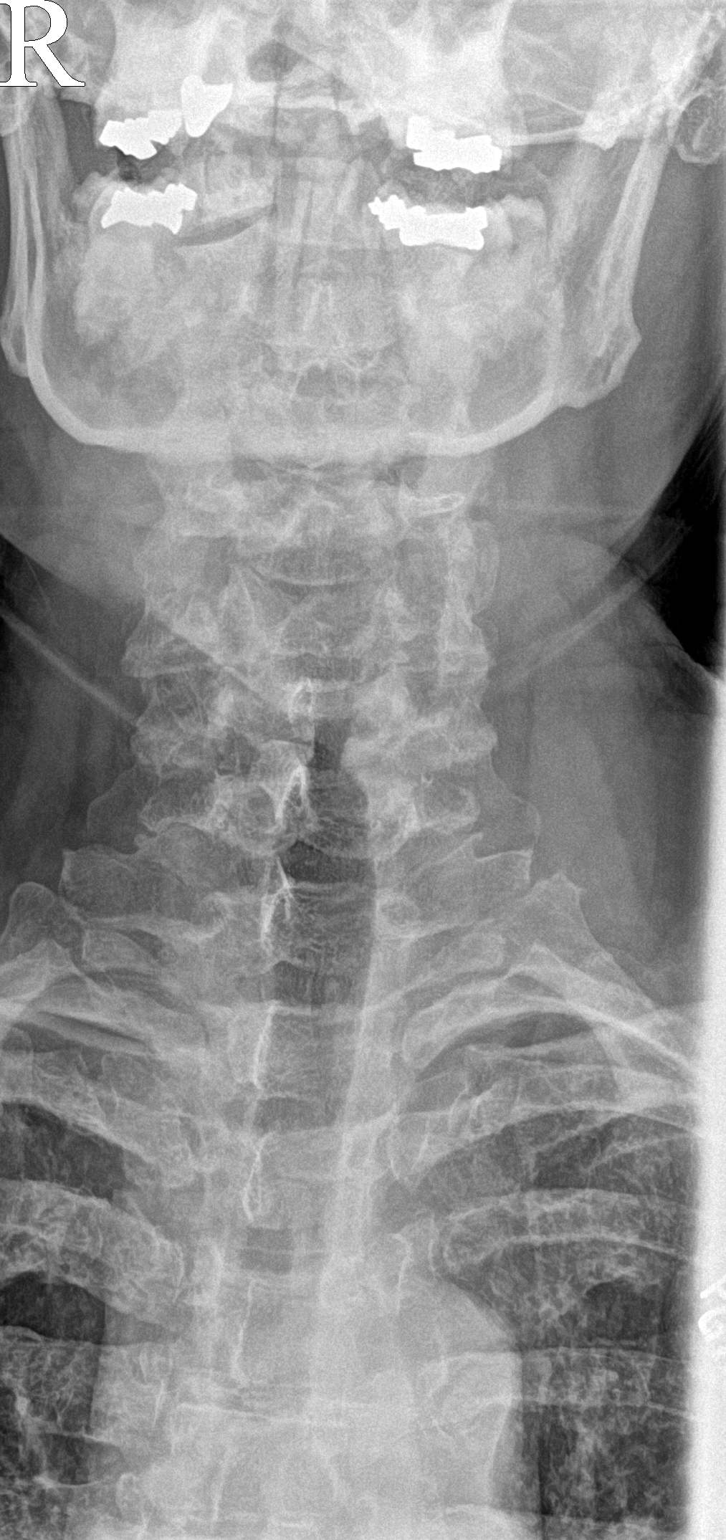

[c-spine open mouth]
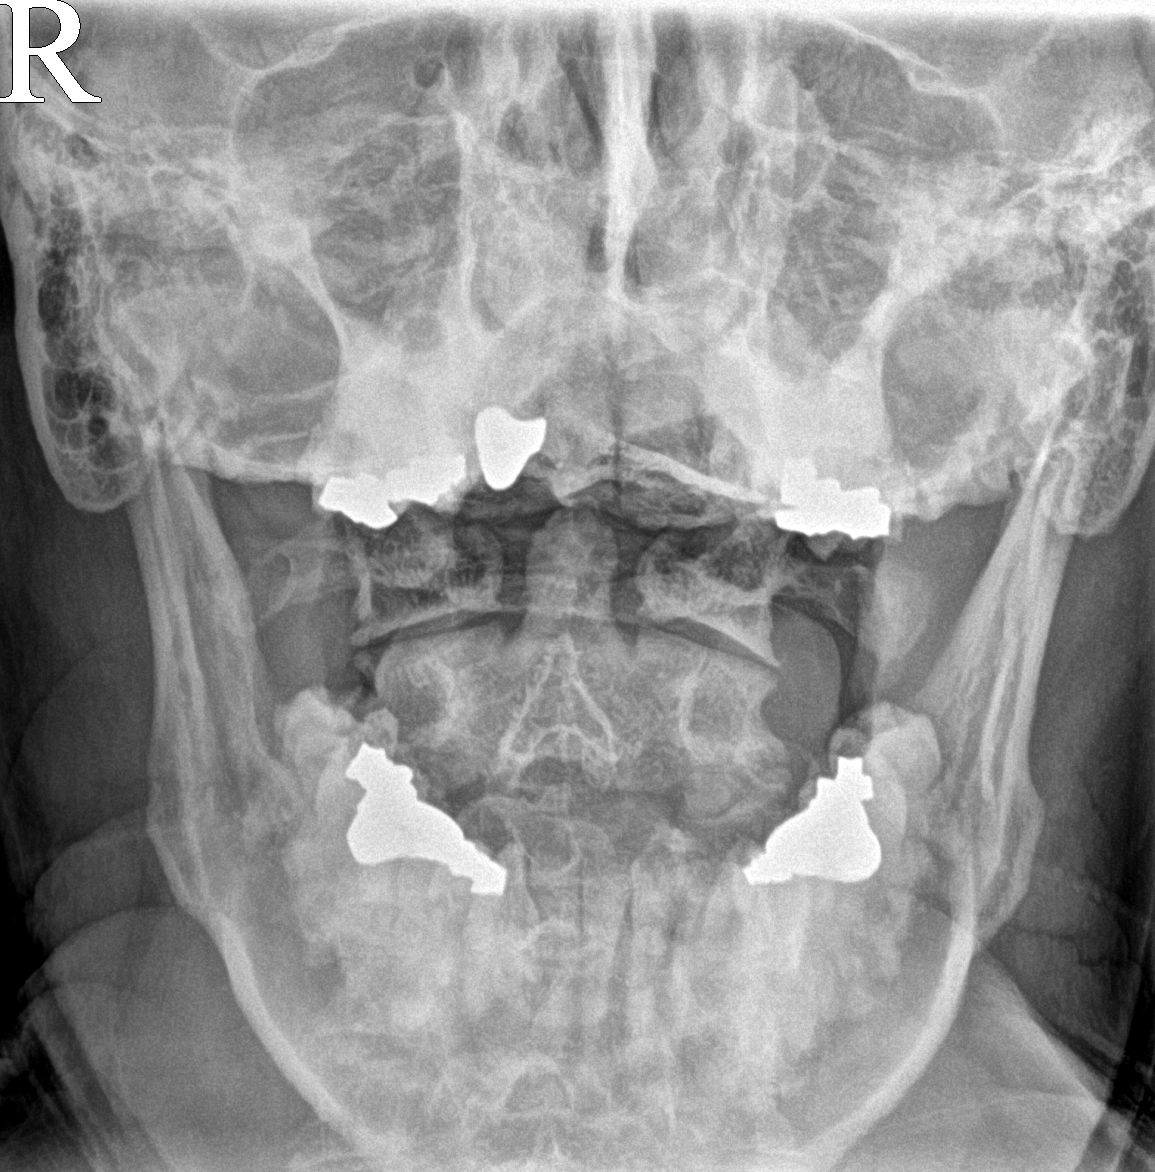

[3 of 3 positions shown; findings below may reference images not displayed]

FINDINGS: Stable straightening of cervical lordosis. Subtle anterolisthesis of
C4 on C5 is stable. Advanced chronic disc and endplate degeneration
C5-C6 and C6-C7. Preserved C1-C2 alignment and joint spaces.
Preserved AP alignment. No acute osseous abnormality identified.
Negative visible upper chest.

The bulky exophytic skin mass seen in on the [5C] CT is no longer
evident.
IMPRESSION: 1. No acute osseous abnormality identified in the cervical spine.
2. Advanced chronic disc and endplate degeneration at C5-C6 and
C6-C7.

## 2020-07-27 IMAGING — DX DG LUMBAR SPINE 2-3V
3 series · 3 of 3 positions shown · non-contrast
Comparison: CT Abdomen and Pelvis [DATE].

CLINICAL DATA: 64-year-old male with leg weakness. Vertigo for 1
month.

EXAM:
LUMBAR SPINE - 2-3 VIEW

[l-spine ap]
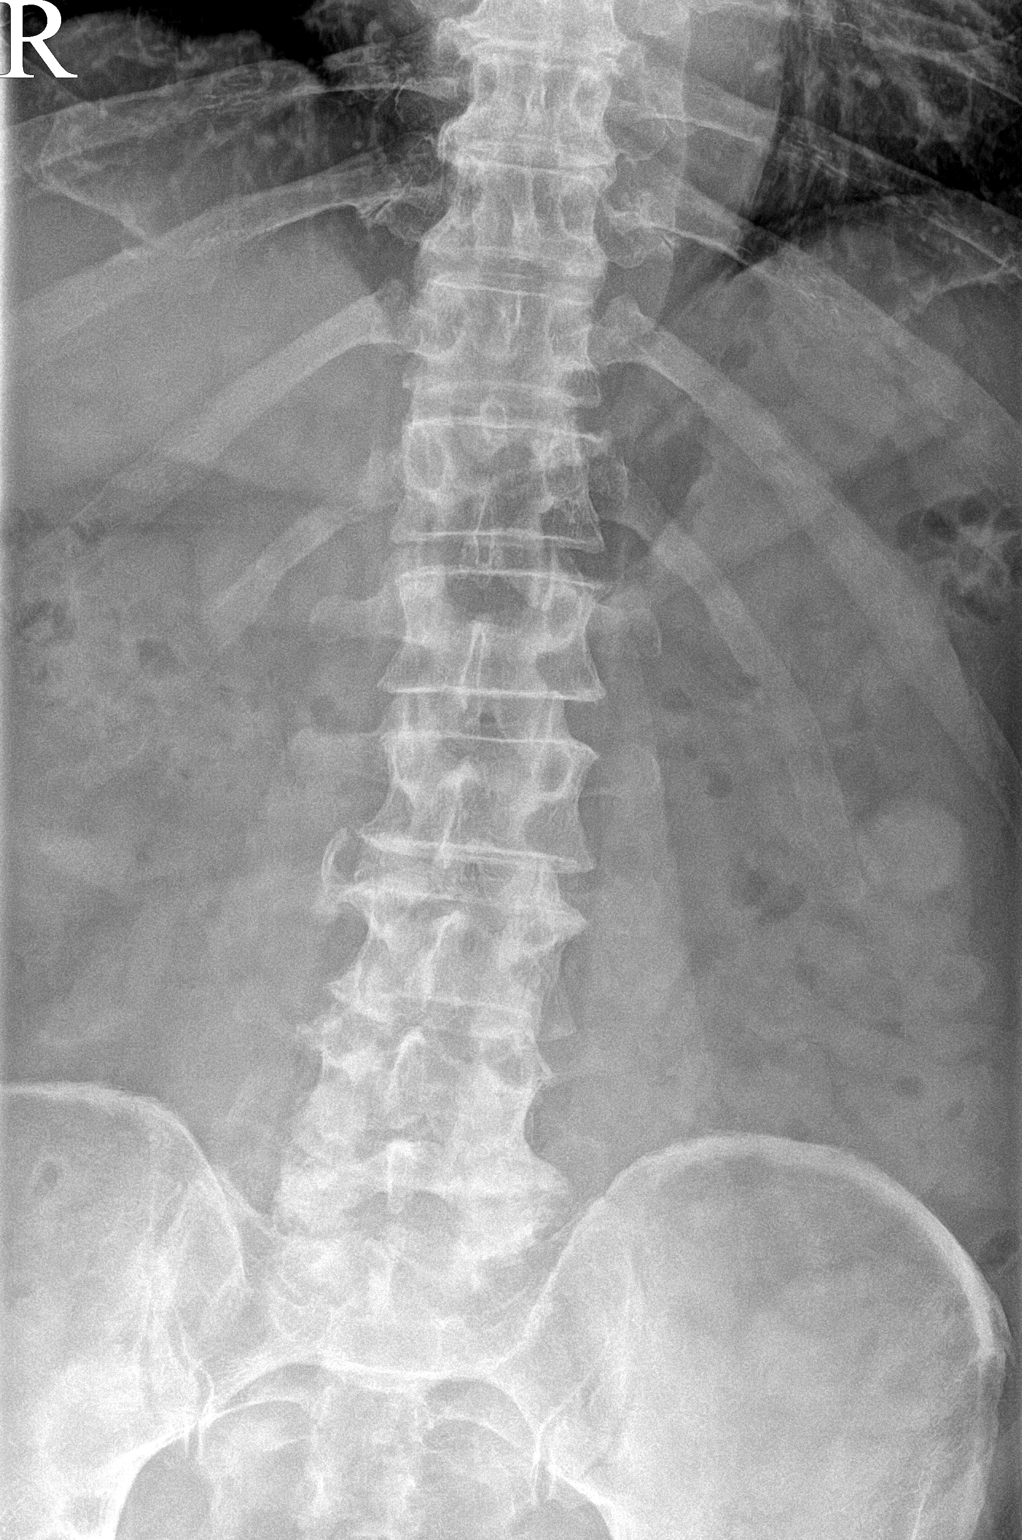

[l-spine lateral (1 of 2)]
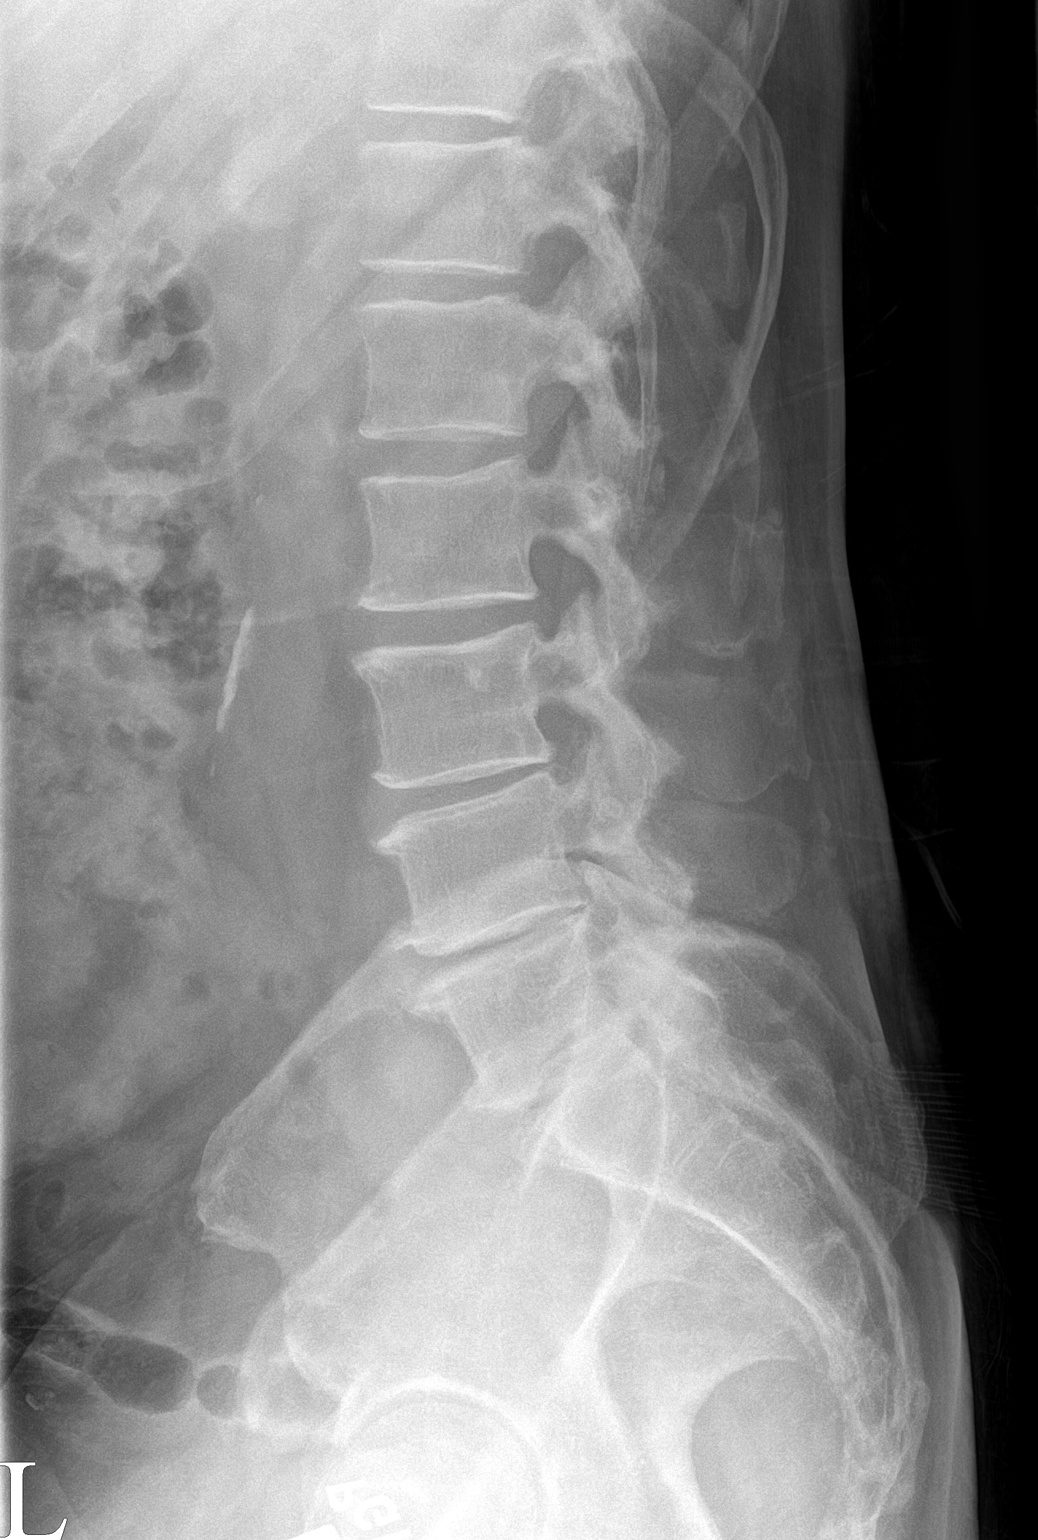

[l-spine lateral (2 of 2)]
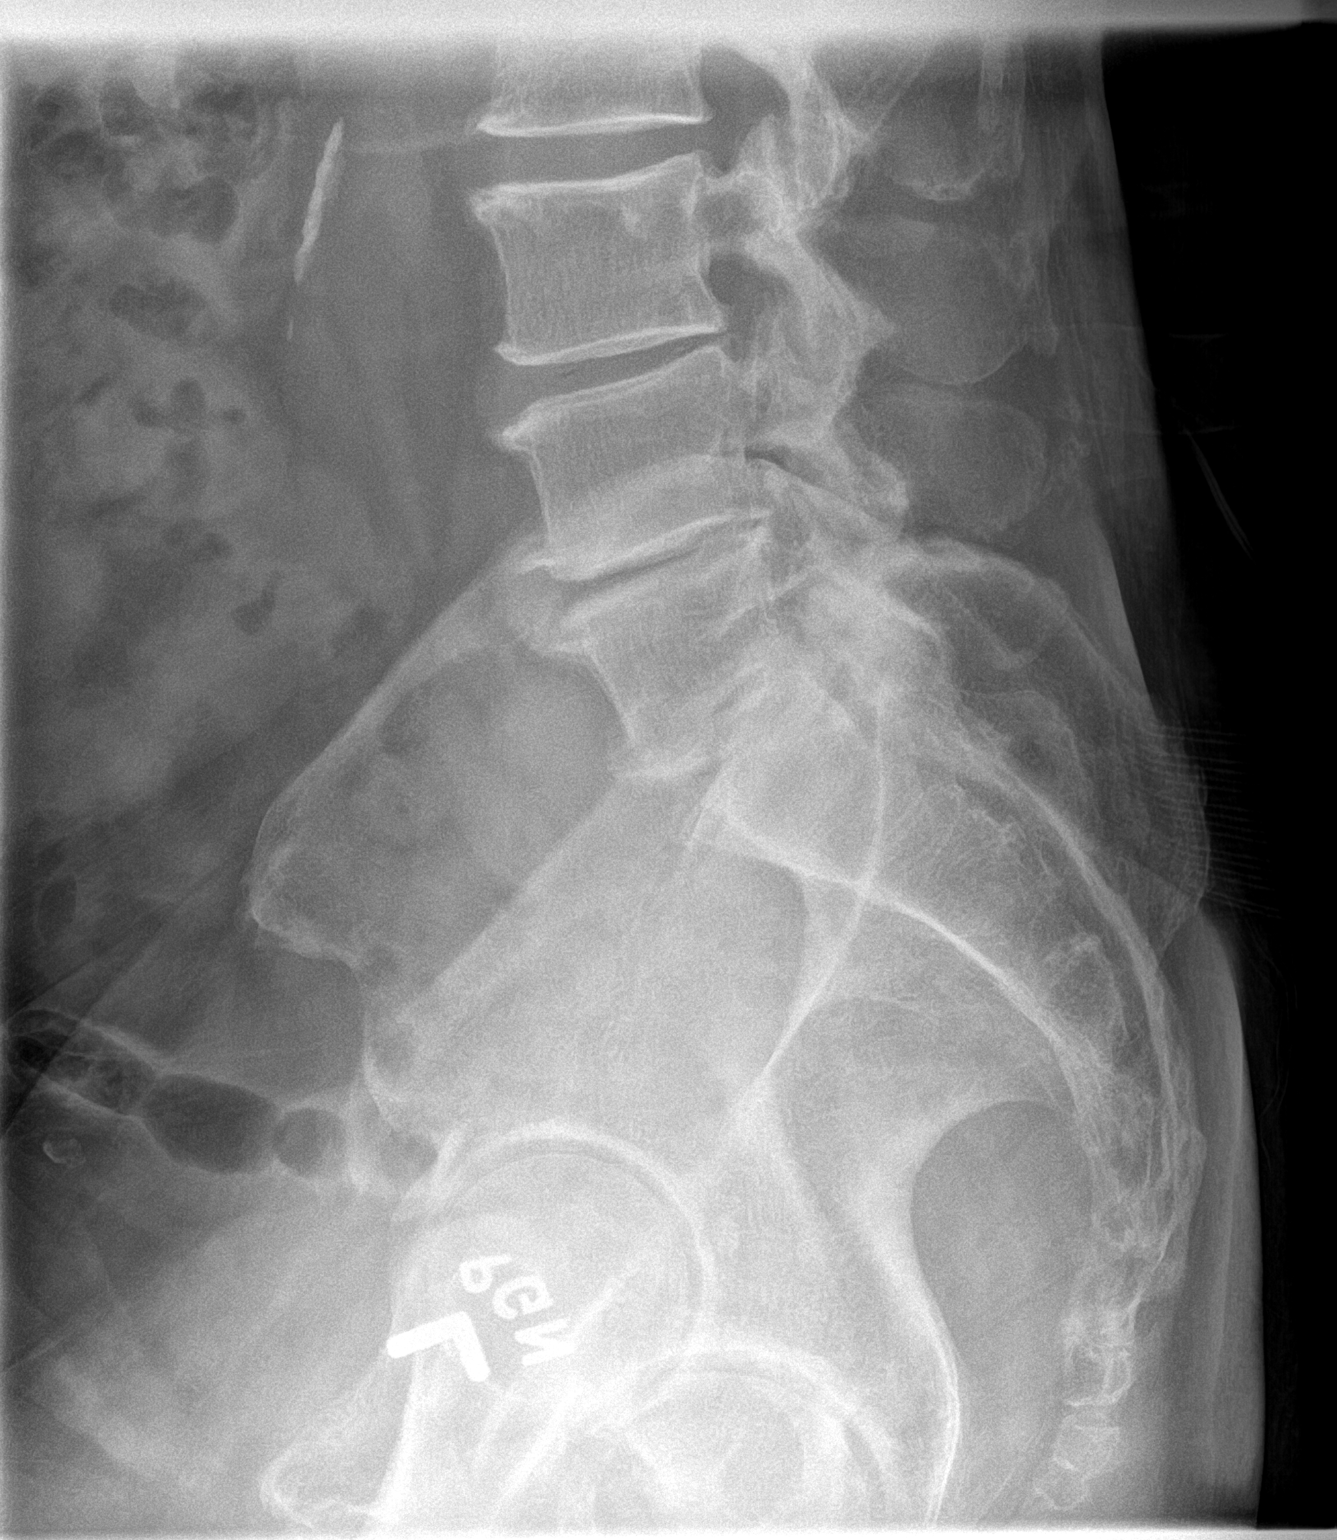

[3 of 3 positions shown; findings below may reference images not displayed]

FINDINGS: Normal lumbar segmentation. Stable lumbar lordosis. No
spondylolisthesis. Advanced chronic disc and endplate degeneration
at L4-L5 and L5-S1. Vacuum disc at the latter. Mild to moderate
facet hypertrophy at those levels. Moderate disc space loss also at
L3-L4, trace vacuum disc there last year. No acute osseous
abnormality identified. Sacrum appears intact. Calcified aortic
atherosclerosis. Negative other abdominal visceral contours.
IMPRESSION: 1. No acute osseous abnormality identified in the lumbar spine.
2. Advanced chronic disc degeneration L3-L4 through L5-S1.
3.  Aortic Atherosclerosis ([R6]-[R6]).

## 2020-07-27 NOTE — Patient Instructions (Addendum)
Thank you for coming in today.  Plan for xray here today and CT scan soon.  Plan also for neurology and physical therapy.  Recheck with me in 2-4 weeks.  I likely will be ordering more tests once we get the results back.   Get labs today.   CT scan will likely be  [address] [place], [place] [address] [phone]

## 2020-07-27 NOTE — Progress Notes (Signed)
Subjective:    CC: Hip pain  I, Walter Rodriguez, LAT, ATC, am serving as scribe for Dr. Lynne Rodriguez.  HPI: Pt is a 63 y/o male presenting w/ c/o dizziness, loss of balance and difficulty moving his L leg normally.  He fell most recently on Sunday when he was walking on a trail and tripped and fell over some sticks/branches.  He has been having difficulty with gait and frequent falls since about June this year.  He notes his gait has become shuffling and is falling and tripping frequently.  He fell recently but does not think he injured himself.  He denies significant pain.  Radiating pain: No Low back pain: No L LE weakness: yes Aggravating factors: Nothing specifically noted Treatments tried: None  Pertinent review of Systems: No fevers or chills  Relevant historical information: Pertinent for melanoma April 2020.  Clean CT scan chest abdomen and pelvis September 2020   Objective:    Vitals:   07/27/20 0757  BP: (!) 142/76  Pulse: 84  SpO2: 97%   General: Well Developed, well nourished, and in no acute distress.  Spine: C-spine normal-appearing nontender normal motion. Upper extremity strength reflexes and sensation are intact and equal. L-spine: Normal-appearing nontender.  Normal motion to rotation and lateral flexion.  Limited flexion and extension. Lower extremity strength decreased strength 4/5 to hip flexion and knee extension left.  Normal otherwise bilateral lower extremities. Reflexes and sensation are equal normal throughout. No clonus. Neuro: Abnormal gait with shuffling broad-based gait.  En bloc turning.  No tremor or cogwheeling. Alert and oriented normal coordination speech and thought process.  Lab and Radiology Results  X-ray images C-spine and L-spine obtained today personally and independently reviewed  C-spine: DDD C5-6 C6-7 present.  Loss of cervical lordosis.  No lytic lesions.  L-spine: Significant DDD and neuroforaminal stenosis L5-S1 and  L4-L5.  Minimal scoliotic change.  No lytic lesions visible per my read.  Await formal radiology review   Impression and Recommendations:    Assessment and Plan: 64 y.o. male with 16-month history of abnormal gait shuffling and frequent falls.  Obviously concerning for some serious neurologic process.  His history of melanoma last year and is obviously concerning for metastatic disease to the brain or spinal cord.  His only focal neurologic deficit is weakness to hip flexion and knee extension. Plan for x-ray C-spine and L-spine today and CT scan head in the near future.  Additionally will obtain limited lab work-up listed below.  Refer both to neurology and to neuro rehab physical therapy.  Recheck back in a few weeks.  May likely proceed directly to MRI brain or spinal cord based on results of CT scan and x-ray.   Orders Placed This Encounter  Procedures  . DG Lumbar Spine 2-3 Views    Standing Status:   Future    Number of Occurrences:   1    Standing Expiration Date:   07/27/2021    Order Specific Question:   Reason for Exam (SYMPTOM  OR DIAGNOSIS REQUIRED)    Answer:   eval leg weakness    Order Specific Question:   Preferred imaging location?    Answer:   [facility]    Order Specific Question:   Radiology Contrast Protocol - do NOT remove file path    Answer:   [email or web address]  . DG Cervical Spine 2-3Vclearing    Standing Status:   Future    Number of Occurrences:   1  Standing Expiration Date:   07/27/2021    Order Specific Question:   Reason for Exam (SYMPTOM  OR DIAGNOSIS REQUIRED)    Answer:   new gait abnormal and leg weakness    Order Specific Question:   Preferred imaging location?    Answer:   [facility]    Order Specific Question:   Radiology Contrast Protocol - do NOT remove file path    Answer:   [email or web address]  . CT HEAD WO CONTRAST    Standing Status:    Future    Standing Expiration Date:   07/27/2021    Order Specific Question:   Preferred imaging location?    Answer:   [facility]    Order Specific Question:   Radiology Contrast Protocol - do NOT remove file path    Answer:   \\[email or web address]  . Vitamin B12    Standing Status:   Future    Number of Occurrences:   1    Standing Expiration Date:   07/27/2021  . CBC with Differential/Platelet    Standing Status:   Future    Number of Occurrences:   1    Standing Expiration Date:   07/27/2021  . COMPLETE METABOLIC PANEL WITH GFR    Standing Status:   Future    Number of Occurrences:   1    Standing Expiration Date:   07/27/2021  . VITAMIN D 25 Hydroxy (Vit-D Deficiency, Fractures)    Standing Status:   Future    Number of Occurrences:   1    Standing Expiration Date:   07/27/2021  . TSH    Standing Status:   Future    Number of Occurrences:   1    Standing Expiration Date:   07/27/2021  . Sedimentation rate    Standing Status:   Future    Number of Occurrences:   1    Standing Expiration Date:   07/27/2021  . CK    Standing Status:   Future    Number of Occurrences:   1    Standing Expiration Date:   07/27/2021  . RPR    Standing Status:   Future    Number of Occurrences:   1    Standing Expiration Date:   07/27/2021  . Ambulatory referral to Physical Therapy    Referral Priority:   Routine    Referral Type:   Physical Medicine    Referral Reason:   Specialty Services Required    Requested Specialty:   Physical Therapy  . Ambulatory referral to Neurology    Referral Priority:   Routine    Referral Type:   Consultation    Referral Reason:   Specialty Services Required    Requested Specialty:   Neurology    Number of Visits Requested:   1   No orders of the defined types were placed in this encounter.   Discussed warning signs or symptoms. Please see discharge instructions. Patient expresses understanding.   The above documentation  has been reviewed and is accurate and complete Walter Rodriguez, M.D.

## 2020-07-28 LAB — CBC WITH DIFFERENTIAL/PLATELET
Absolute Monocytes: 435 cells/uL (ref 200–950)
Basophils Absolute: 41 cells/uL (ref 0–200)
Basophils Relative: 0.5 %
Eosinophils Absolute: 82 cells/uL (ref 15–500)
Eosinophils Relative: 1 %
HCT: 48.2 % (ref 38.5–50.0)
Hemoglobin: 15.7 g/dL (ref 13.2–17.1)
Lymphs Abs: 1476 cells/uL (ref 850–3900)
MCH: 27.4 pg (ref 27.0–33.0)
MCHC: 32.6 g/dL (ref 32.0–36.0)
MCV: 84 fL (ref 80.0–100.0)
MPV: 10.1 fL (ref 7.5–12.5)
Monocytes Relative: 5.3 %
Neutro Abs: 6166 cells/uL (ref 1500–7800)
Neutrophils Relative %: 75.2 %
Platelets: 280 10*3/uL (ref 140–400)
RBC: 5.74 10*6/uL (ref 4.20–5.80)
RDW: 13.4 % (ref 11.0–15.0)
Total Lymphocyte: 18 %
WBC: 8.2 10*3/uL (ref 3.8–10.8)

## 2020-07-28 LAB — COMPLETE METABOLIC PANEL WITH GFR
AG Ratio: 1.7 (calc) (ref 1.0–2.5)
ALT: 20 U/L (ref 9–46)
AST: 23 U/L (ref 10–35)
Albumin: 4.3 g/dL (ref 3.6–5.1)
Alkaline phosphatase (APISO): 53 U/L (ref 35–144)
BUN: 15 mg/dL (ref 7–25)
CO2: 27 mmol/L (ref 20–32)
Calcium: 9.5 mg/dL (ref 8.6–10.3)
Chloride: 103 mmol/L (ref 98–110)
Creat: 1.19 mg/dL (ref 0.70–1.25)
GFR, Est African American: 73 mL/min/{1.73_m2} (ref >=60)
GFR, Est Non African American: 63 mL/min/{1.73_m2} (ref >=60)
Globulin: 2.6 g/dL (calc) (ref 1.9–3.7)
Glucose, Bld: 128 mg/dL — ABNORMAL HIGH (ref 65–99)
Potassium: 4 mmol/L (ref 3.5–5.3)
Sodium: 140 mmol/L (ref 135–146)
Total Bilirubin: 0.7 mg/dL (ref 0.2–1.2)
Total Protein: 6.9 g/dL (ref 6.1–8.1)

## 2020-07-28 LAB — SEDIMENTATION RATE: Sed Rate: 2 mm/h (ref 0–20)

## 2020-07-28 LAB — CK: Total CK: 286 U/L — ABNORMAL HIGH (ref 44–196)

## 2020-07-28 LAB — VITAMIN B12: Vitamin B-12: 532 pg/mL (ref 200–1100)

## 2020-07-28 LAB — TSH: TSH: 1.99 mIU/L (ref 0.40–4.50)

## 2020-07-28 LAB — VITAMIN D 25 HYDROXY (VIT D DEFICIENCY, FRACTURES): Vit D, 25-Hydroxy: 30 ng/mL (ref 30–100)

## 2020-07-28 LAB — RPR: RPR Ser Ql: NONREACTIVE

## 2020-07-28 NOTE — Progress Notes (Signed)
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  Again if needed MRI will be helpful in the future.

## 2020-07-28 NOTE — Progress Notes (Signed)
X-ray cervical spine shows degenerative disc disease at C5-6 and C6-7.  If we proceed to MRI will see potential bulging disks at this level.

## 2020-07-28 NOTE — Progress Notes (Signed)
Labs for the most part are normal.  CK which is a measure of muscle inflammation is mildly elevated.  This can be in the normal range for some people and probably does not represent disease especially has other labs are negative. Blood sugar is mildly elevated as well but not high enough to indicate significant problem.  1 other labs are still pending.

## 2020-07-28 NOTE — Progress Notes (Signed)
Vitamin D is normal.  Other labs are negative.

## 2020-08-01 ENCOUNTER — Ambulatory Visit (INDEPENDENT_AMBULATORY_CARE_PROVIDER_SITE_OTHER): Payer: Medicare Other

## 2020-08-01 ENCOUNTER — Telehealth: Payer: Self-pay | Admitting: Family Medicine

## 2020-08-01 DIAGNOSIS — R269 Unspecified abnormalities of gait and mobility: Secondary | ICD-10-CM

## 2020-08-01 DIAGNOSIS — R296 Repeated falls: Secondary | ICD-10-CM

## 2020-08-01 DIAGNOSIS — R29898 Other symptoms and signs involving the musculoskeletal system: Secondary | ICD-10-CM

## 2020-08-01 DIAGNOSIS — Z85820 Personal history of malignant melanoma of skin: Secondary | ICD-10-CM

## 2020-08-01 DIAGNOSIS — G9389 Other specified disorders of brain: Secondary | ICD-10-CM

## 2020-08-01 IMAGING — CT CT HEAD W/O CM
3 of 4 series · 15 of 47 positions shown, 18 images · non-contrast
Comparison: None.

CLINICAL DATA: Lower extremity weakness and recent falls

EXAM:
CT HEAD WITHOUT CONTRAST
TECHNIQUE: Contiguous axial images were obtained from the base of the skull
through the vertex without intravenous contrast.

[Series 2: head wo · axial · 0.44mm/px · z∈[-160,-24]mm · 9 of 33 slices shown, 12 images]
[im 3/33  brain]
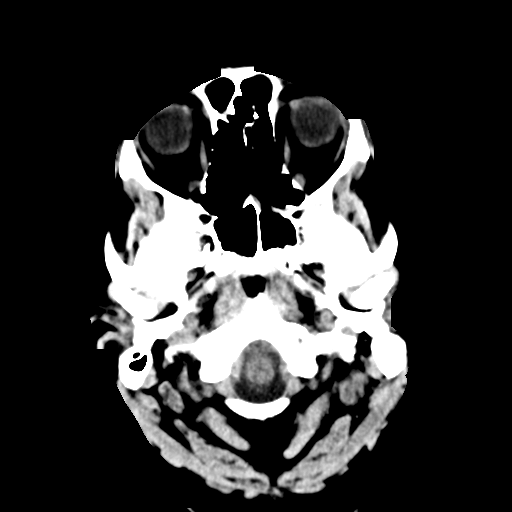
[im 3/33  bone]
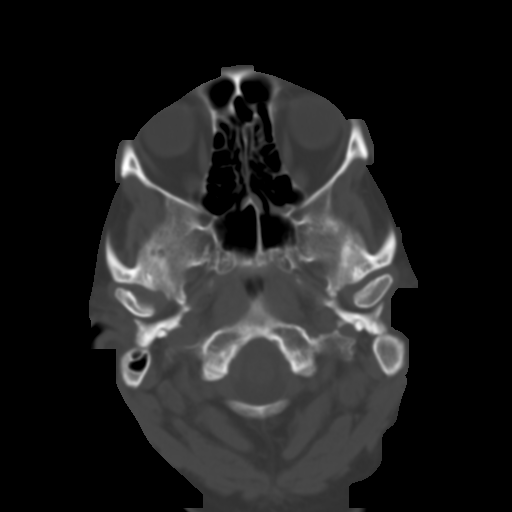
[im 7/33  brain]
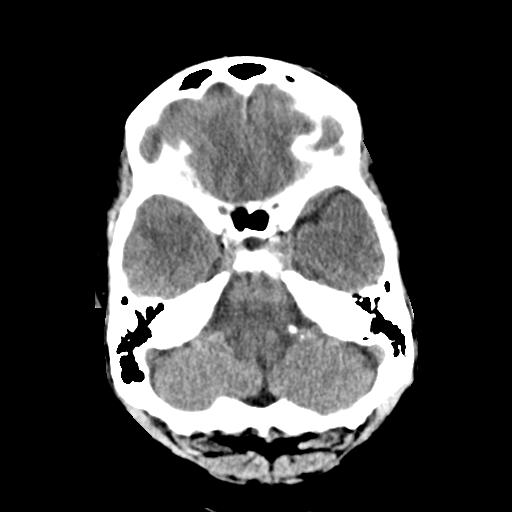
[im 10/33  brain]
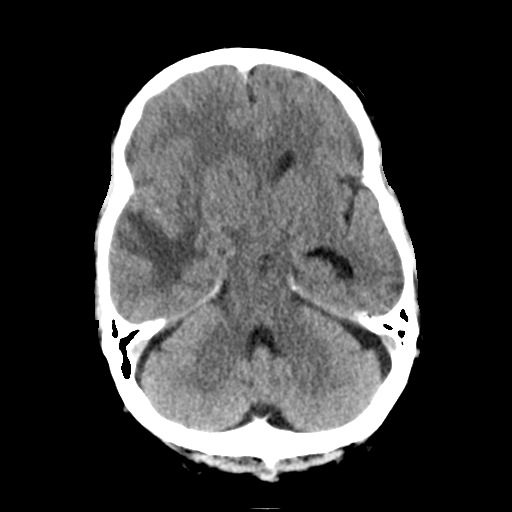
[im 14/33  brain]
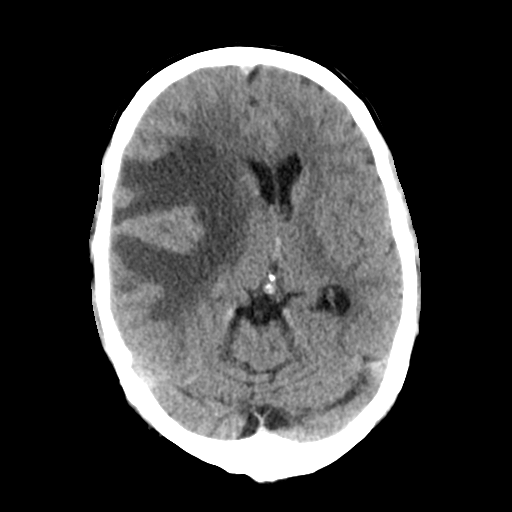
[im 17/33  brain]
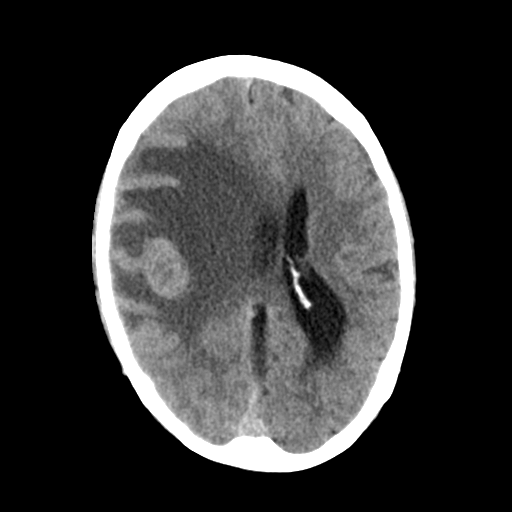
[im 17/33  bone]
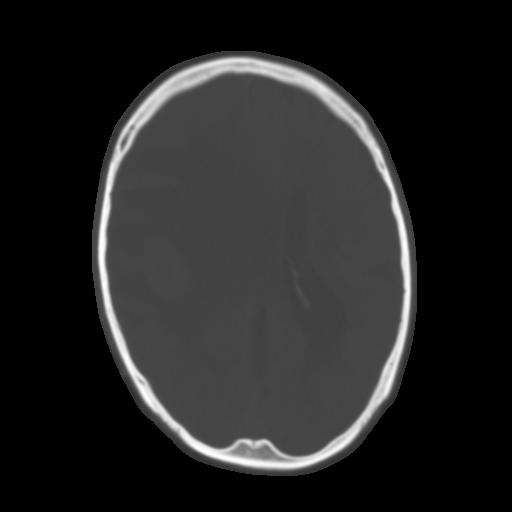
[im 19/33  brain]
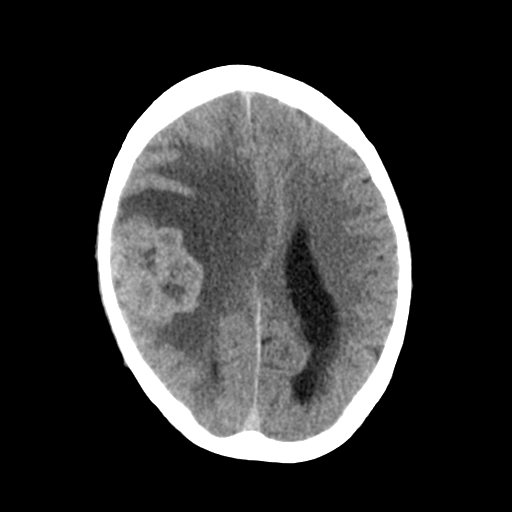
[im 23/33  brain]
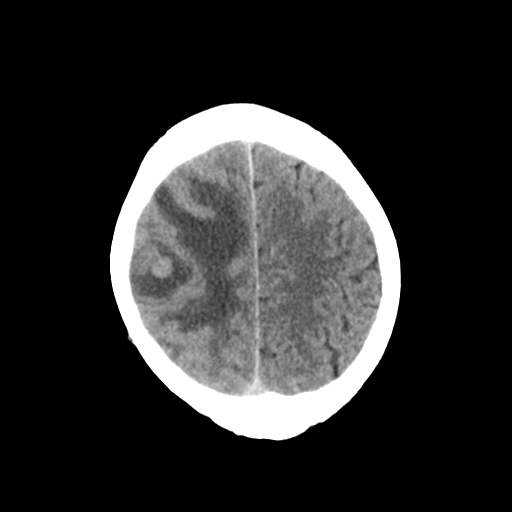
[im 26/33  brain]
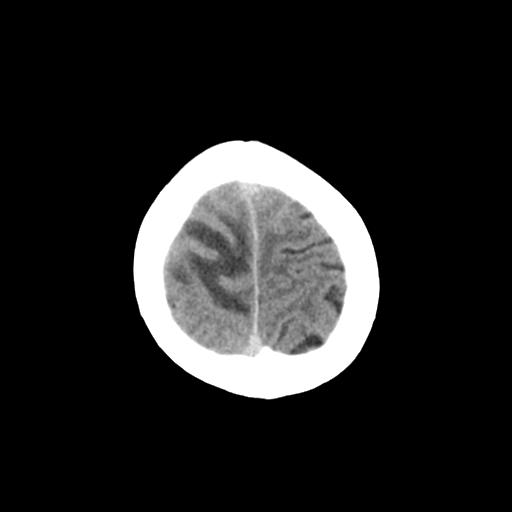
[im 30/33  brain]
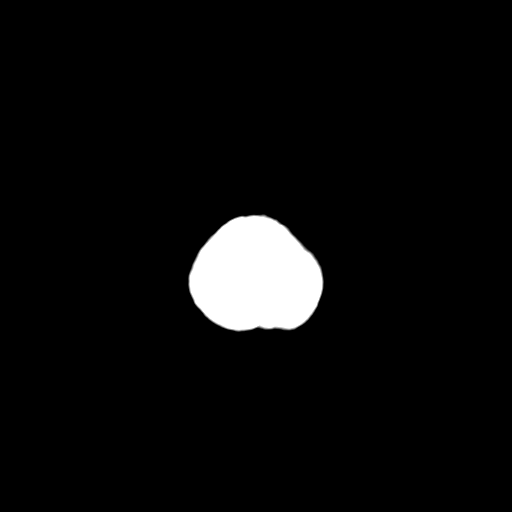
[im 30/33  bone]
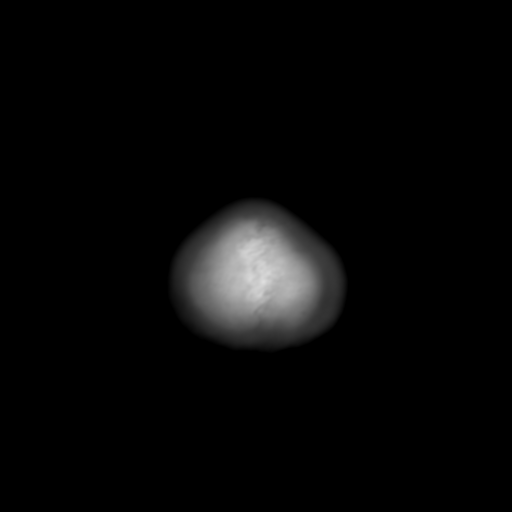

[Series 4: head wo coronal · coronal · 0.32mm/px · 3 of 71 slices shown]
[im 24/71  brain]
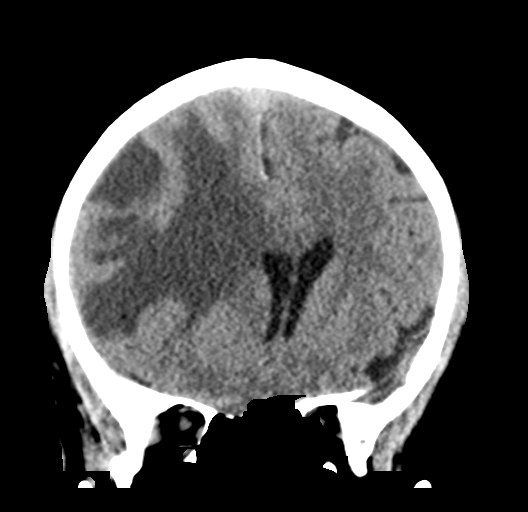
[im 32/71  brain]
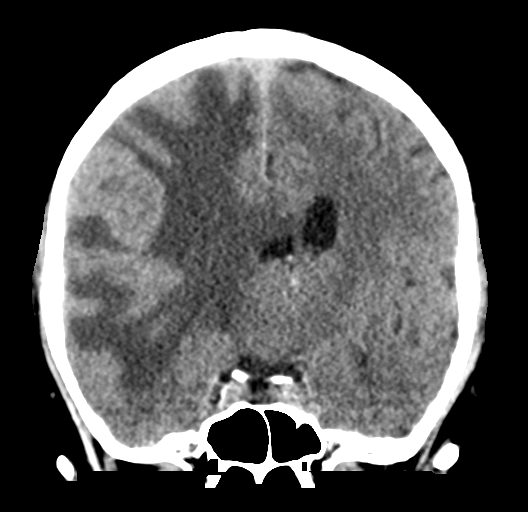
[im 39/71  brain]
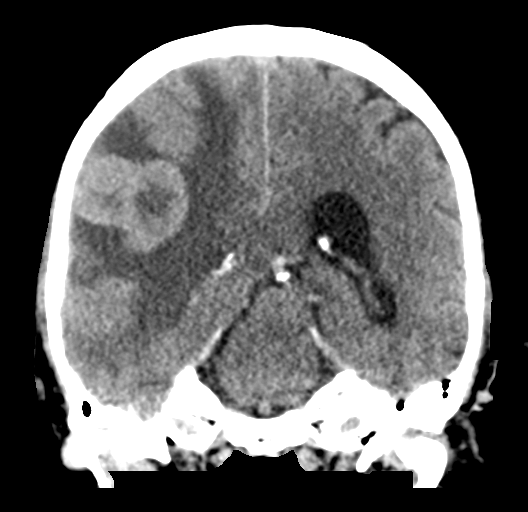

[Series 5: head wo sagittal · sagittal · 0.33mm/px · 3 of 55 slices shown]
[im 19/55  brain]
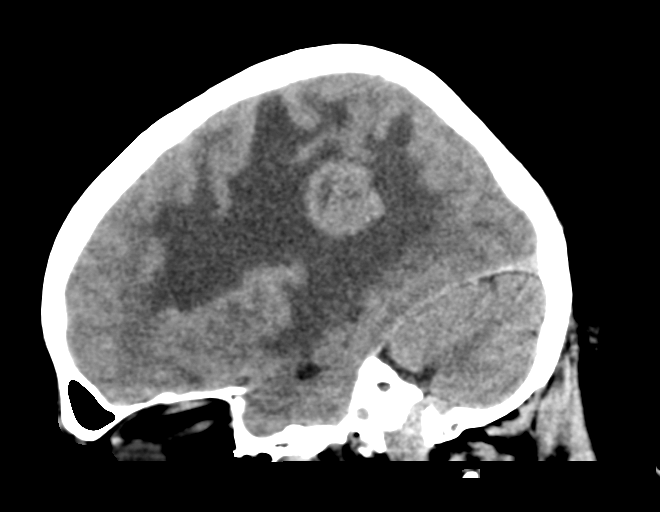
[im 28/55  brain]
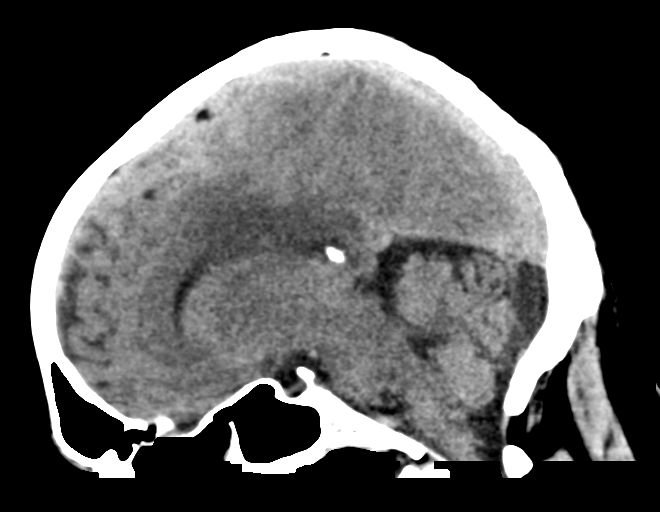
[im 37/55  brain]
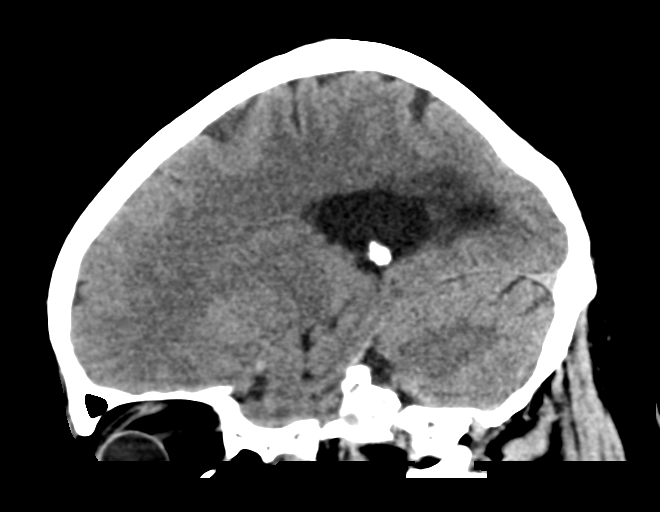

[15 of 47 positions shown; findings below may reference images not displayed]

FINDINGS: Brain: There is an apparent mass arising at the right
frontal-temporal junction measuring 4.5 x 4.0 cm with extensive
surrounding vasogenic edema. There is shift of the lateral
ventricles from the midline by 1.1 cm. Third ventricle is also
shifted to the left. Fourth ventricle is midline.

There is no evident hemorrhage. There is no subdural or epidural
fluid collection. No acute appearing infarct evident.

Vascular: No hyperdense vessel. No appreciable vascular
calcification.

Skull: The bony calvarium appears intact.

Sinuses/Orbits: Mucosal thickening is noted in several ethmoid air
cells. Other visualized paranasal sinuses are clear. Visualized
orbits appear symmetric bilaterally.

Other: Mastoid air cells are clear. There is debris in each external
auditory canal, likely due to cerumen.
IMPRESSION: 1. Mass at the junction of the periphery of the frontal and parietal
lobes the left measuring 4.5 x 4.0 cm. Extensive surrounding
vasogenic edema. 1.1 cm of midline shift the lateral and third
ventricles to the left. Fourth ventricle midline.

2.  No hemorrhage or acute infarct.

3. Mucosal thickening in several ethmoid air cells. Probable cerumen
in each external auditory canal.

These results will be called to the ordering clinician or
representative by the Radiologist Assistant, and communication
documented in the PACS or [REDACTED].

## 2020-08-01 MED ORDER — DEXAMETHASONE 4 MG PO TABS: 4.0000 mg | ORAL_TABLET | Freq: Two times a day (BID) | ORAL | 1 refills | Status: DC

## 2020-08-01 NOTE — Progress Notes (Signed)
Reviewed findings with patient on the phone.  Mass concerning for primary brain tumor or possibly metastatic cancer from his melanoma history. Plan for stat MRI to further characterize mass.  Urgent referral to neurosurgery.  Also will prescribe dexamethasone for the swelling present on CT scan.  Patient scheduled to see me this week on Thursday the 26 with his sister to review the results in person.

## 2020-08-01 NOTE — Telephone Encounter (Signed)
[facility] called with a call report for the patient's CT HEAD   IMPRESSION: 1. Mass at the junction of the periphery of the frontal and parietal lobes the left measuring 4.5 x 4.0 cm. Extensive surrounding vasogenic edema. 1.1 cm of midline shift the lateral and third ventricles to the left. Fourth ventricle midline.  2.  No hemorrhage or acute infarct.  3. Mucosal thickening in several ethmoid air cells. Probable cerumen in each external auditory canal.

## 2020-08-01 NOTE — Telephone Encounter (Signed)
I called Walter Rodriguez about his CT scan.  Concerning for brain tumor or metastatic melanoma.  Plan for stat MRI brain and referral to neurosurgery.  Will prescribe dexamethasone for brain swelling seen on CT scan.  Advised patient to schedule with me ASAP to go over the results with his sister who is helping to care for him.

## 2020-08-02 ENCOUNTER — Other Ambulatory Visit: Payer: Self-pay | Admitting: Family Medicine

## 2020-08-02 ENCOUNTER — Telehealth: Payer: Self-pay | Admitting: Oncology

## 2020-08-02 ENCOUNTER — Other Ambulatory Visit: Payer: Self-pay

## 2020-08-02 ENCOUNTER — Inpatient Hospital Stay (HOSPITAL_COMMUNITY)
Admission: EM | Admit: 2020-08-02 | Discharge: 2020-08-07 | DRG: 025 | Disposition: A | Discharge status: Home Health | Payer: Medicare Other | Attending: Internal Medicine | Admitting: Internal Medicine

## 2020-08-02 ENCOUNTER — Telehealth: Payer: Self-pay | Admitting: Family Medicine

## 2020-08-02 ENCOUNTER — Emergency Department (HOSPITAL_COMMUNITY): Payer: Medicare Other

## 2020-08-02 ENCOUNTER — Encounter (HOSPITAL_COMMUNITY): Payer: Self-pay | Admitting: Family Medicine

## 2020-08-02 ENCOUNTER — Ambulatory Visit: Payer: Medicare Other | Admitting: Physical Therapy

## 2020-08-02 ENCOUNTER — Ambulatory Visit (INDEPENDENT_AMBULATORY_CARE_PROVIDER_SITE_OTHER): Payer: Medicare Other

## 2020-08-02 DIAGNOSIS — R296 Repeated falls: Secondary | ICD-10-CM | POA: Diagnosis not present

## 2020-08-02 DIAGNOSIS — Z20822 Contact with and (suspected) exposure to covid-19: Secondary | ICD-10-CM | POA: Diagnosis present

## 2020-08-02 DIAGNOSIS — R7303 Prediabetes: Secondary | ICD-10-CM | POA: Diagnosis present

## 2020-08-02 DIAGNOSIS — Z85820 Personal history of malignant melanoma of skin: Secondary | ICD-10-CM

## 2020-08-02 DIAGNOSIS — G9389 Other specified disorders of brain: Secondary | ICD-10-CM

## 2020-08-02 DIAGNOSIS — E872 Acidosis: Secondary | ICD-10-CM | POA: Diagnosis present

## 2020-08-02 DIAGNOSIS — G936 Cerebral edema: Secondary | ICD-10-CM

## 2020-08-02 DIAGNOSIS — E663 Overweight: Secondary | ICD-10-CM | POA: Diagnosis present

## 2020-08-02 DIAGNOSIS — R482 Apraxia: Secondary | ICD-10-CM | POA: Diagnosis present

## 2020-08-02 DIAGNOSIS — Z6827 Body mass index (BMI) 27.0-27.9, adult: Secondary | ICD-10-CM | POA: Diagnosis not present

## 2020-08-02 DIAGNOSIS — R269 Unspecified abnormalities of gait and mobility: Secondary | ICD-10-CM | POA: Diagnosis not present

## 2020-08-02 DIAGNOSIS — C7931 Secondary malignant neoplasm of brain: Secondary | ICD-10-CM | POA: Diagnosis present

## 2020-08-02 DIAGNOSIS — R29898 Other symptoms and signs involving the musculoskeletal system: Secondary | ICD-10-CM | POA: Diagnosis not present

## 2020-08-02 DIAGNOSIS — G935 Compression of brain: Secondary | ICD-10-CM

## 2020-08-02 DIAGNOSIS — R270 Ataxia, unspecified: Secondary | ICD-10-CM | POA: Diagnosis present

## 2020-08-02 DIAGNOSIS — M899 Disorder of bone, unspecified: Secondary | ICD-10-CM | POA: Diagnosis present

## 2020-08-02 DIAGNOSIS — D72829 Elevated white blood cell count, unspecified: Secondary | ICD-10-CM

## 2020-08-02 DIAGNOSIS — D496 Neoplasm of unspecified behavior of brain: Secondary | ICD-10-CM | POA: Diagnosis present

## 2020-08-02 DIAGNOSIS — R739 Hyperglycemia, unspecified: Secondary | ICD-10-CM | POA: Diagnosis present

## 2020-08-02 DIAGNOSIS — R509 Fever, unspecified: Secondary | ICD-10-CM | POA: Diagnosis not present

## 2020-08-02 DIAGNOSIS — Z9889 Other specified postprocedural states: Secondary | ICD-10-CM | POA: Diagnosis not present

## 2020-08-02 LAB — BASIC METABOLIC PANEL
Anion gap: 8 (ref 5–15)
BUN: 18 mg/dL (ref 8–23)
CO2: 24 mmol/L (ref 22–32)
Calcium: 9 mg/dL (ref 8.9–10.3)
Chloride: 109 mmol/L (ref 98–111)
Creatinine, Ser: 1.2 mg/dL (ref 0.61–1.24)
GFR calc Af Amer: >60 mL/min (ref >60)
GFR calc non Af Amer: >60 mL/min (ref >60)
Glucose, Bld: 107 mg/dL — ABNORMAL HIGH (ref 70–99)
Potassium: 3.9 mmol/L (ref 3.5–5.1)
Sodium: 141 mmol/L (ref 135–145)

## 2020-08-02 LAB — CBC WITH DIFFERENTIAL/PLATELET
Abs Immature Granulocytes: 0.04 10*3/uL (ref 0.00–0.07)
Basophils Absolute: 0 10*3/uL (ref 0.0–0.1)
Basophils Relative: 0 %
Eosinophils Absolute: 0.1 10*3/uL (ref 0.0–0.5)
Eosinophils Relative: 1 %
HCT: 45.6 % (ref 39.0–52.0)
Hemoglobin: 14.4 g/dL (ref 13.0–17.0)
Immature Granulocytes: 0 %
Lymphocytes Relative: 19 %
Lymphs Abs: 2 10*3/uL (ref 0.7–4.0)
MCH: 27 pg (ref 26.0–34.0)
MCHC: 31.6 g/dL (ref 30.0–36.0)
MCV: 85.4 fL (ref 80.0–100.0)
Monocytes Absolute: 1.1 10*3/uL — ABNORMAL HIGH (ref 0.1–1.0)
Monocytes Relative: 10 %
Neutro Abs: 7 10*3/uL (ref 1.7–7.7)
Neutrophils Relative %: 70 %
Platelets: 259 10*3/uL (ref 150–400)
RBC: 5.34 MIL/uL (ref 4.22–5.81)
RDW: 13.7 % (ref 11.5–15.5)
WBC: 10.1 10*3/uL (ref 4.0–10.5)
nRBC: 0 % (ref 0.0–0.2)

## 2020-08-02 LAB — SARS CORONAVIRUS 2 BY RT PCR (HOSPITAL ORDER, PERFORMED IN ~~LOC~~ HOSPITAL LAB): SARS Coronavirus 2: NEGATIVE

## 2020-08-02 IMAGING — CT CT CHEST W/ CM
2 of 3 series · 15 of 36 positions shown, 18 images · IV contrast (Omni 300)
Comparison: [DATE]

CLINICAL DATA: Melanoma.  Surveillance examination.

EXAM:
CT CHEST, ABDOMEN, AND PELVIS WITH CONTRAST
TECHNIQUE: Multidetector CT imaging of the chest, abdomen and pelvis was
performed following the standard protocol during bolus
administration of intravenous contrast.
CONTRAST:  100mL OMNIPAQUE IOHEXOL 300 MG/ML  SOLN

[Series 3: chest with 2mm st · axial · 0.69mm/px · z∈[+1196,+1462]mm · 12 of 157 slices shown, 15 images]
[im 12/157  mediastinal]
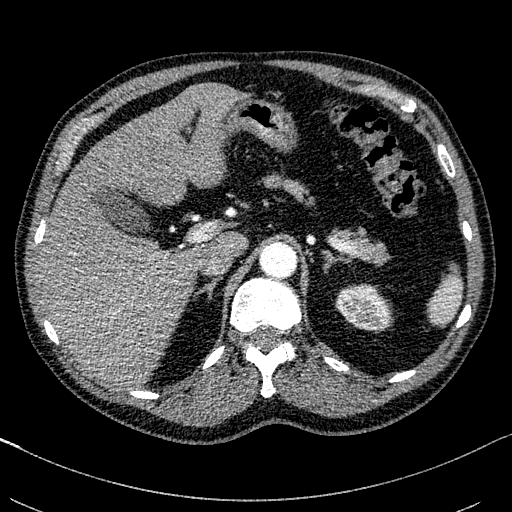
[im 12/157  lung]
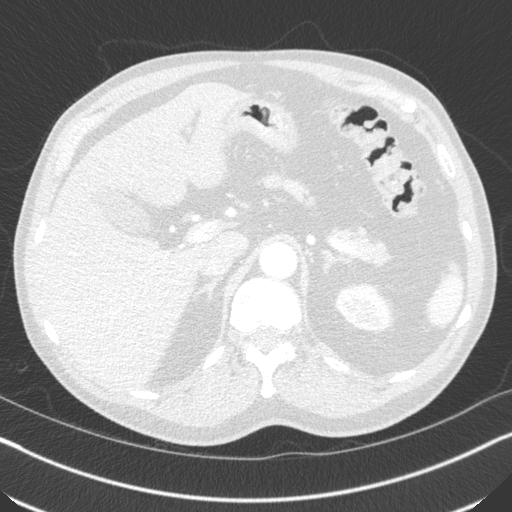
[im 24/157  lung]
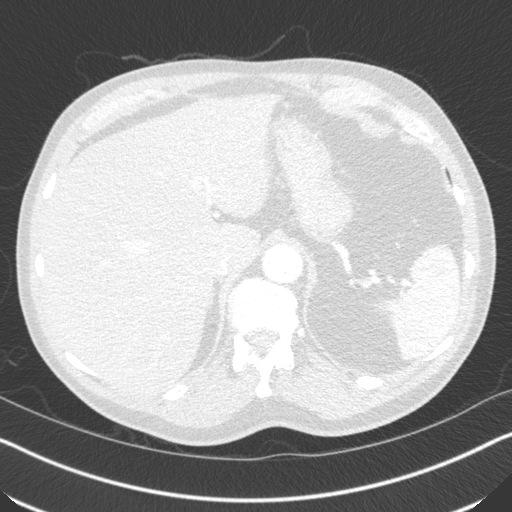
[im 35/157  lung]
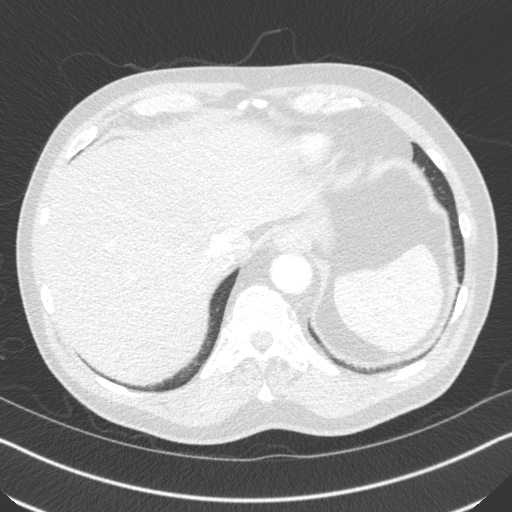
[im 47/157  lung]
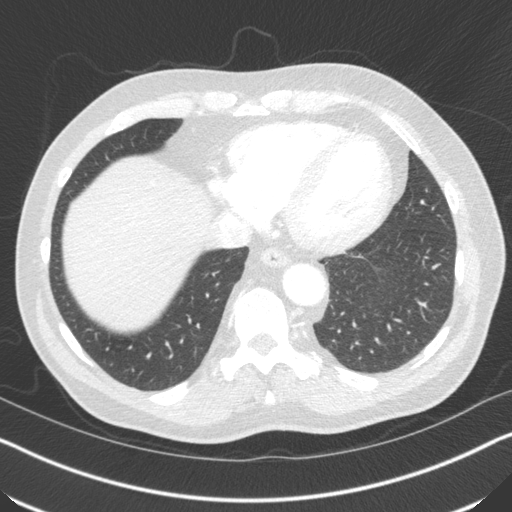
[im 58/157  mediastinal]
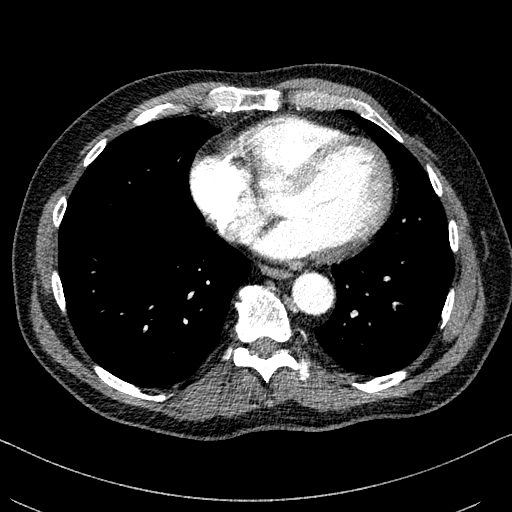
[im 58/157  lung]
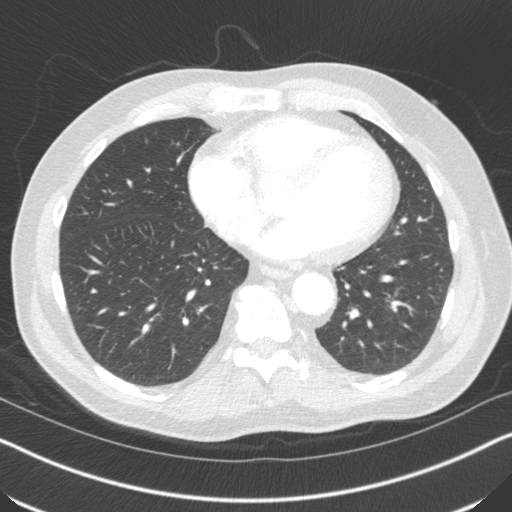
[im 70/157  lung]
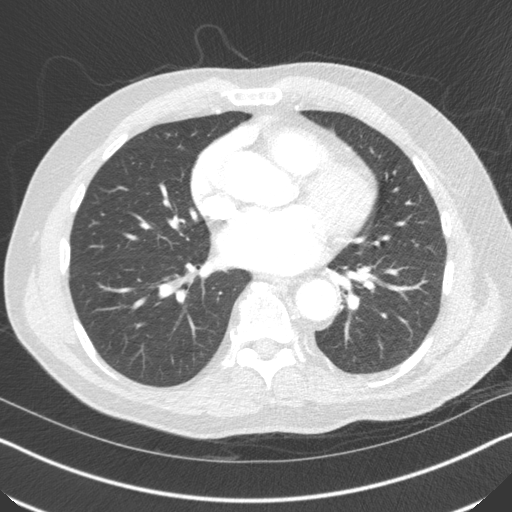
[im 87/157  lung]
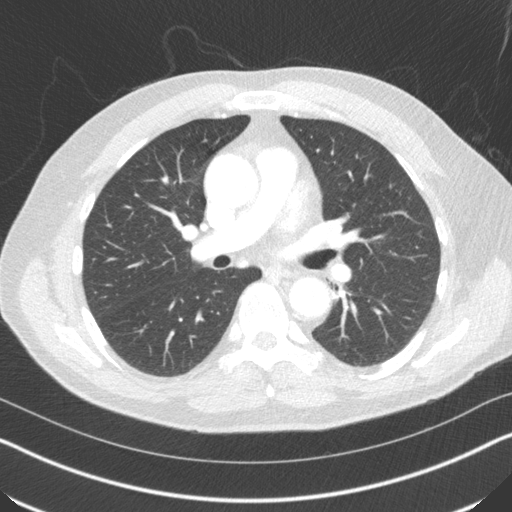
[im 99/157  lung]
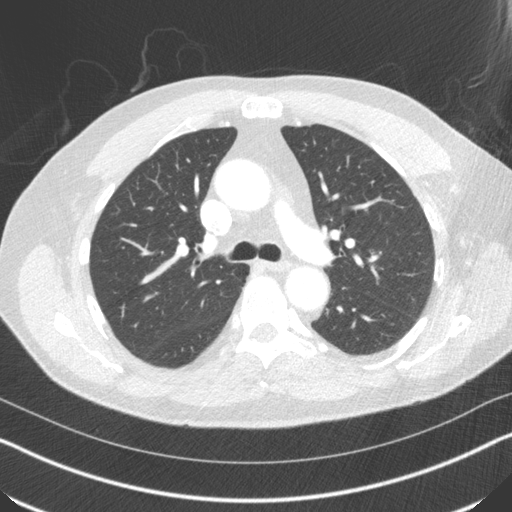
[im 110/157  mediastinal]
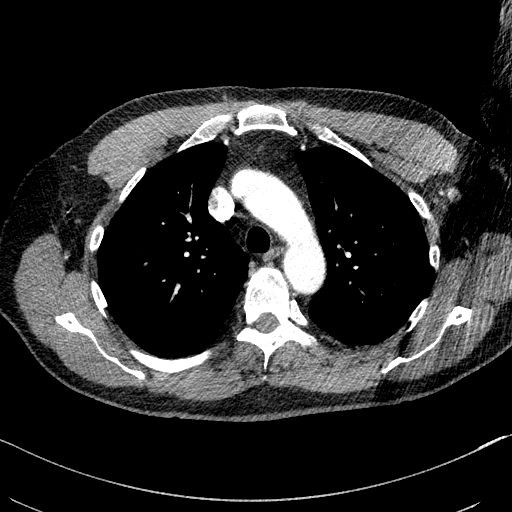
[im 110/157  lung]
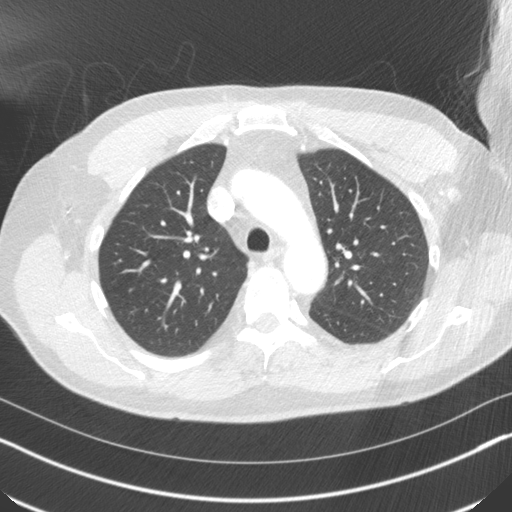
[im 122/157  lung]
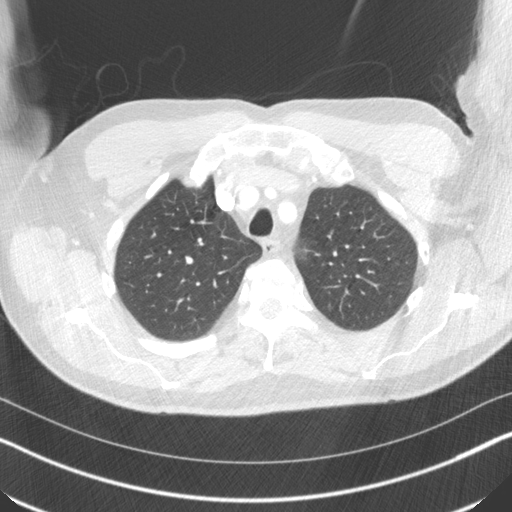
[im 133/157  lung]
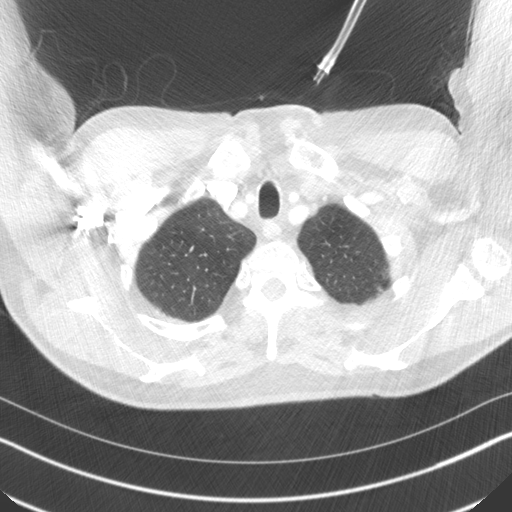
[im 145/157  lung]
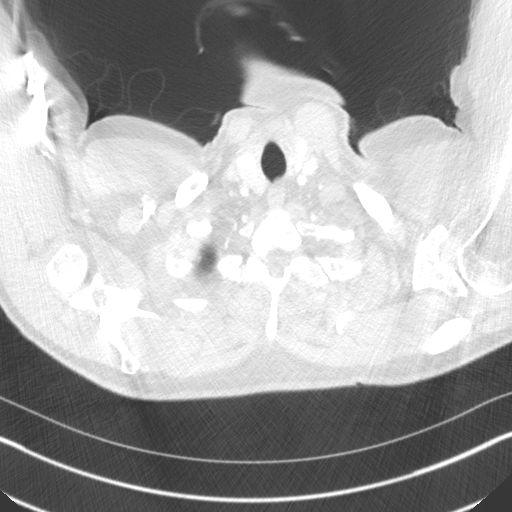

[Series 6: chest with 2mm st cor · coronal · 0.61mm/px · 3 of 135 slices shown]
[im 27/135  lung]
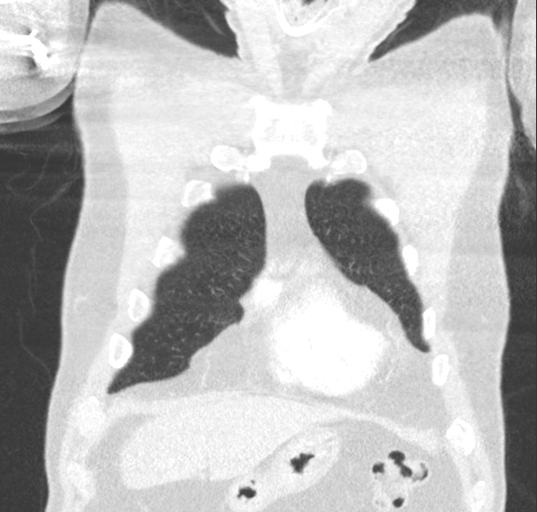
[im 54/135  lung]
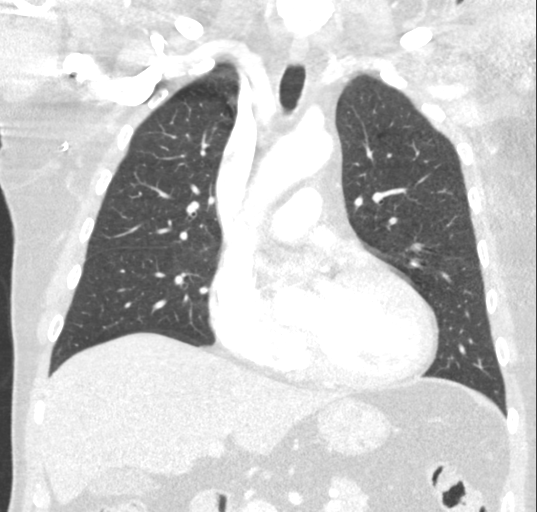
[im 81/135  lung]
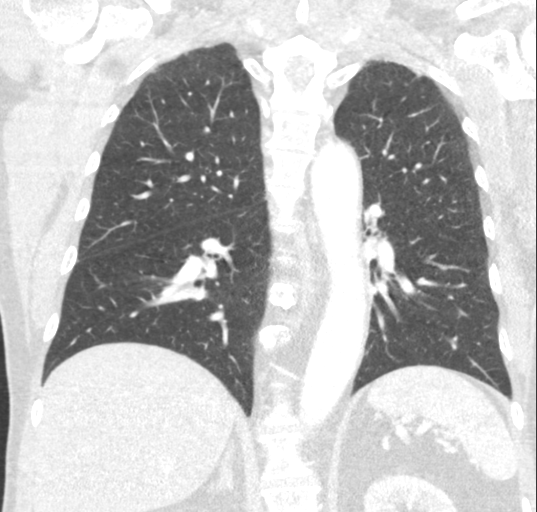

[15 of 36 positions shown; findings below may reference images not displayed]

FINDINGS: CT CHEST FINDINGS

Cardiovascular: No significant vascular findings. Normal heart size.
No pericardial effusion. Thoracic aorta is unremarkable.

Mediastinum/Nodes: No enlarged mediastinal, hilar, or axillary lymph
nodes. Surgical changes of right axillary lymph node dissection are
identified. Thyroid gland, trachea, and esophagus demonstrate no
significant findings.

Lungs/Pleura: Lungs are clear. No pleural effusion or pneumothorax.

Musculoskeletal: Subcentimeter sclerotic focus within the right
eighth rib just beyond the costovertebral junction is stable since
prior examination and likely a benign bone island. No suspicious
lytic or blastic bone lesions are seen within the thorax.

CT ABDOMEN PELVIS FINDINGS

Hepatobiliary: No focal liver abnormality is seen. No gallstones,
gallbladder wall thickening, or biliary dilatation.

Pancreas: Unremarkable

Spleen: Unremarkable

Adrenals/Urinary Tract: The adrenal glands are unremarkable. Simple
cortical cysts again noted within the kidneys. The kidneys are
otherwise unremarkable. The bladder is unremarkable.

Stomach/Bowel: The stomach, small bowel, and large bowel are
unremarkable. Appendix normal. No free intraperitoneal gas or fluid.

Vascular/Lymphatic: Mild aorta atherosclerotic calcification noted.
No aneurysm. No pathologic adenopathy within the abdomen and pelvis.

Reproductive: Prostate is unremarkable.

Other: Rectum unremarkable.  Tiny fat containing umbilical hernia.

Musculoskeletal: Degenerative changes are seen within the lumbar
spine. Vertebral hemangioma noted within the L2 vertebral body. No
focal suspicious lytic or blastic bone lesions are seen.
IMPRESSION: 1. No evidence of recurrent or metastatic disease within the chest,
abdomen, or pelvis.
2. Aortic atherosclerosis.

Aortic Atherosclerosis ([VP]-[VP]).

## 2020-08-02 IMAGING — CT CT ABD-PELV W/ CM
2 of 5 series · 16 of 46 positions shown, 18 images · IV contrast (Omni 300)
Comparison: [DATE]

CLINICAL DATA: Melanoma.  Surveillance examination.

EXAM:
CT CHEST, ABDOMEN, AND PELVIS WITH CONTRAST
TECHNIQUE: Multidetector CT imaging of the chest, abdomen and pelvis was
performed following the standard protocol during bolus
administration of intravenous contrast.
CONTRAST:  100mL OMNIPAQUE IOHEXOL 300 MG/ML  SOLN

[Series 3: a/p w/ 5mm · axial · 0.74mm/px · z∈[+845,+1270]mm · 13 of 96 slices shown, 15 images]
[im 6/96  soft-tissue]
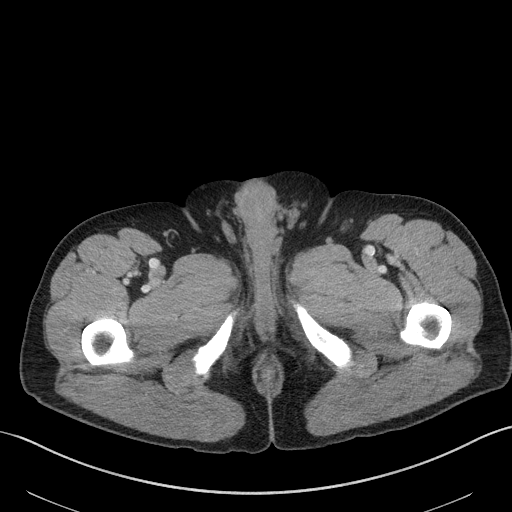
[im 6/96  bone]
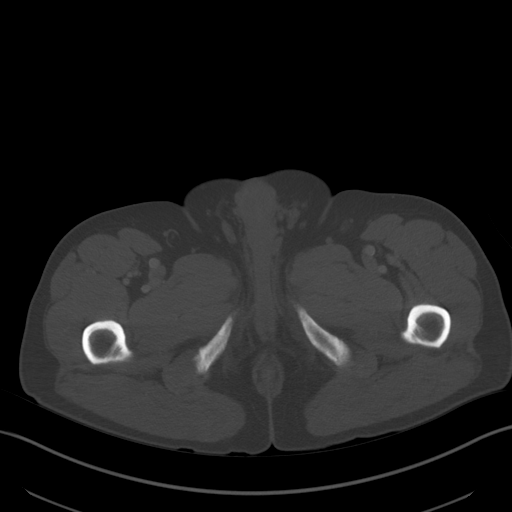
[im 16/96  soft-tissue]
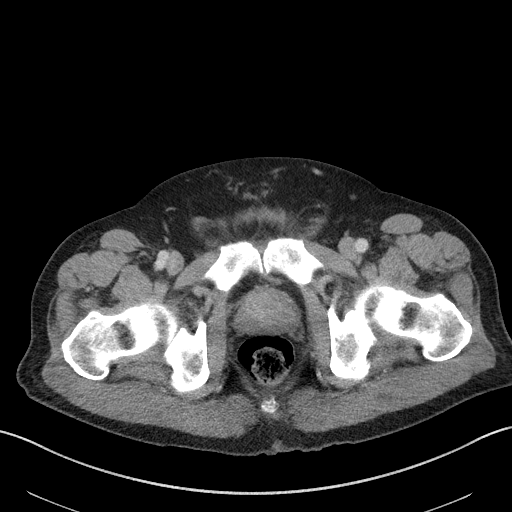
[im 21/96  soft-tissue]
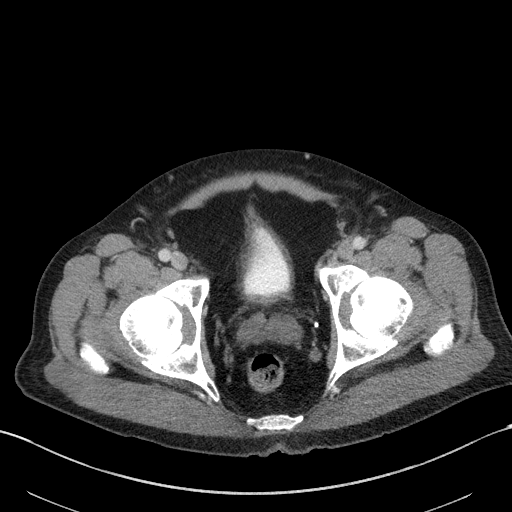
[im 26/96  soft-tissue]
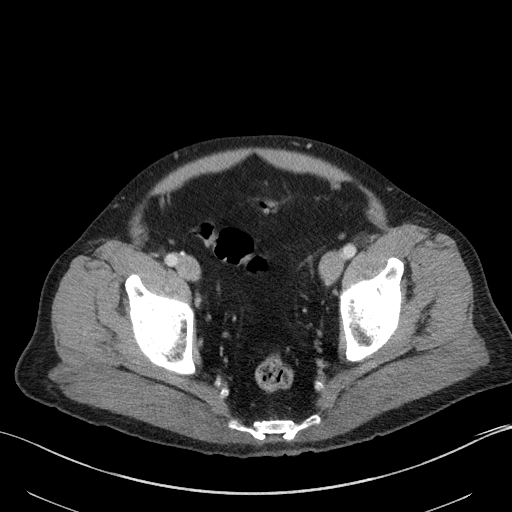
[im 36/96  soft-tissue]
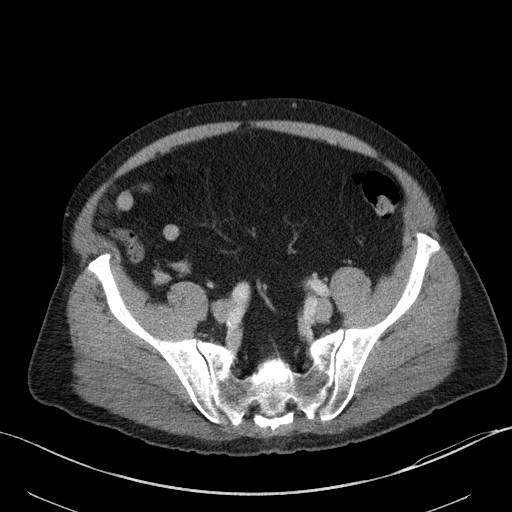
[im 41/96  soft-tissue]
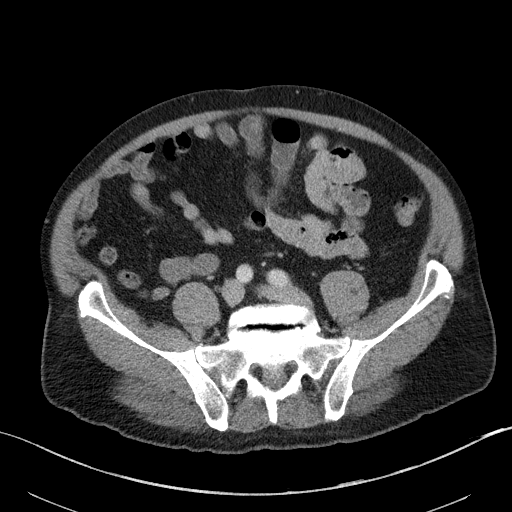
[im 51/96  soft-tissue]
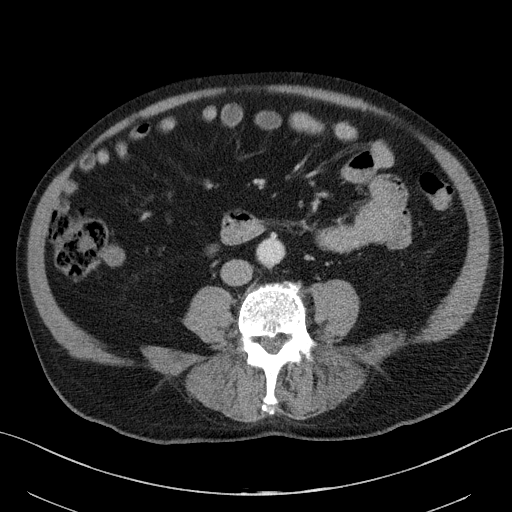
[im 56/96  soft-tissue]
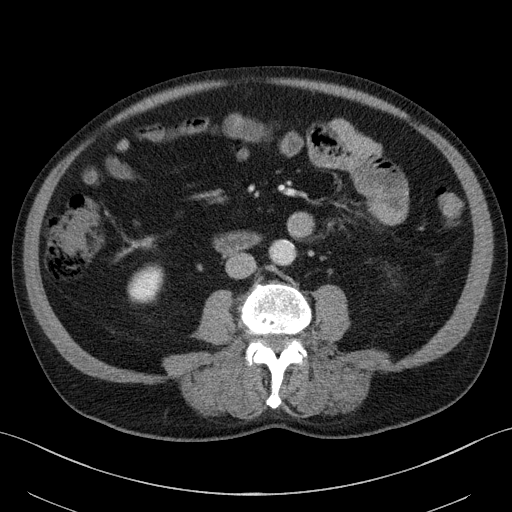
[im 61/96  soft-tissue]
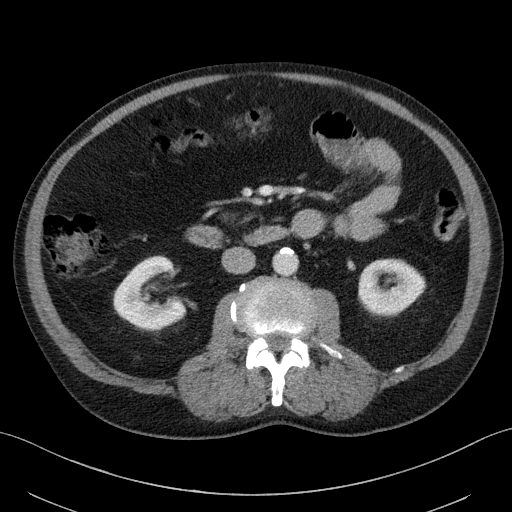
[im 61/96  bone]
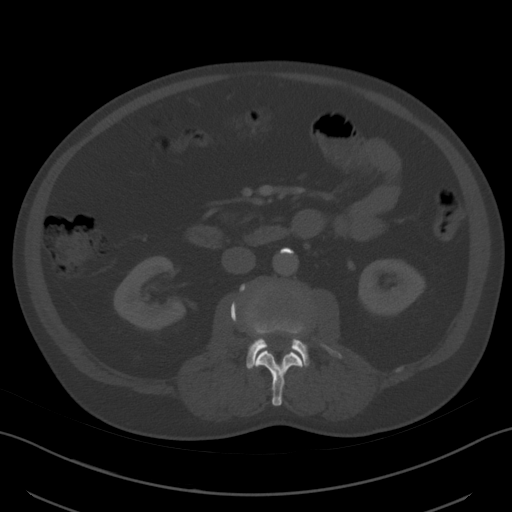
[im 71/96  soft-tissue]
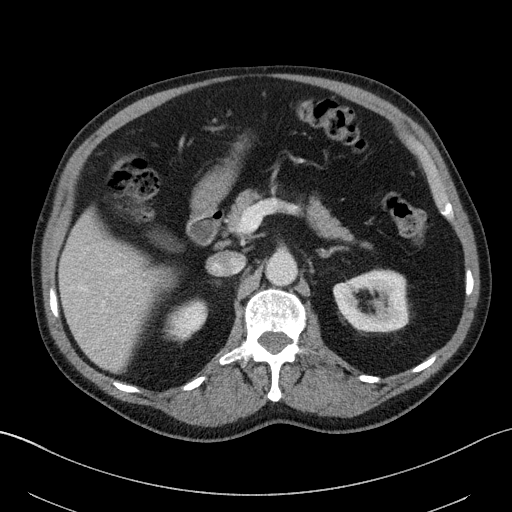
[im 76/96  soft-tissue]
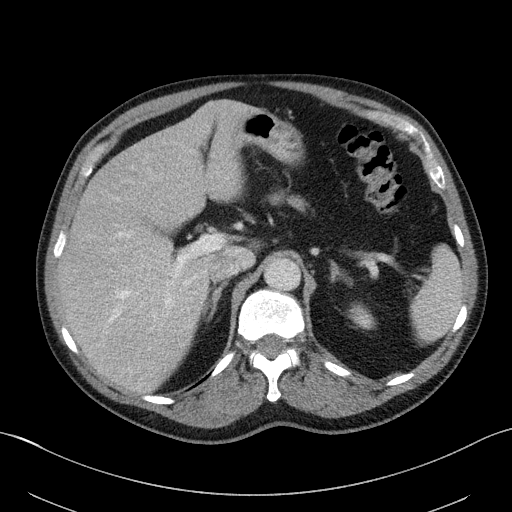
[im 81/96  soft-tissue]
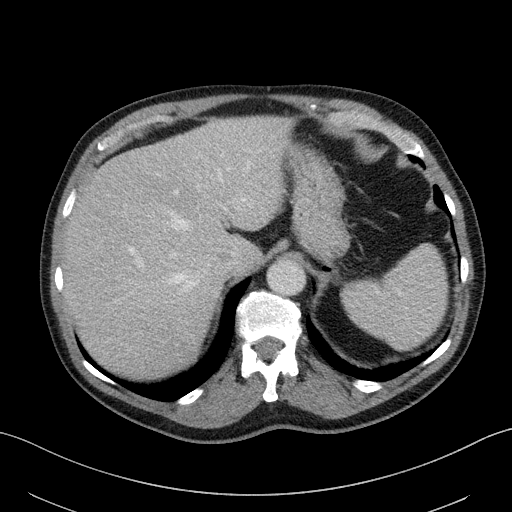
[im 91/96  soft-tissue]
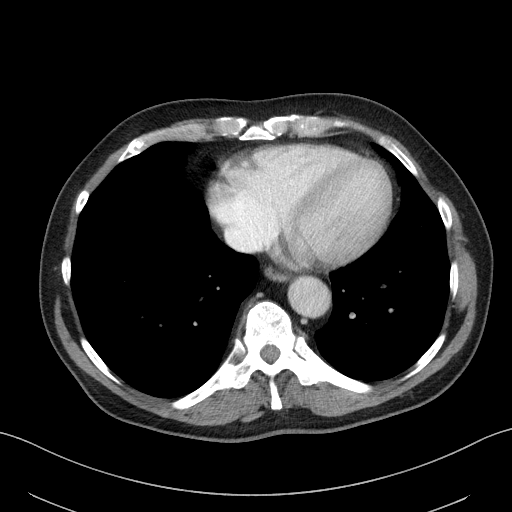

[Series 6: a/p w/ cor · coronal · 0.75mm/px · 3 of 149 slices shown]
[im 50/149  soft-tissue]
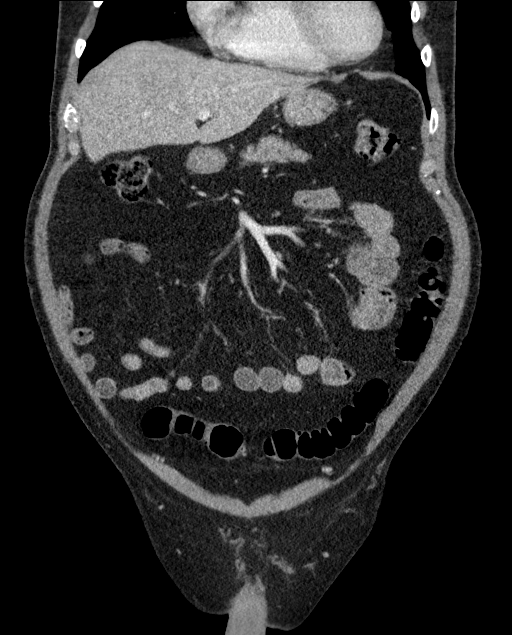
[im 66/149  soft-tissue]
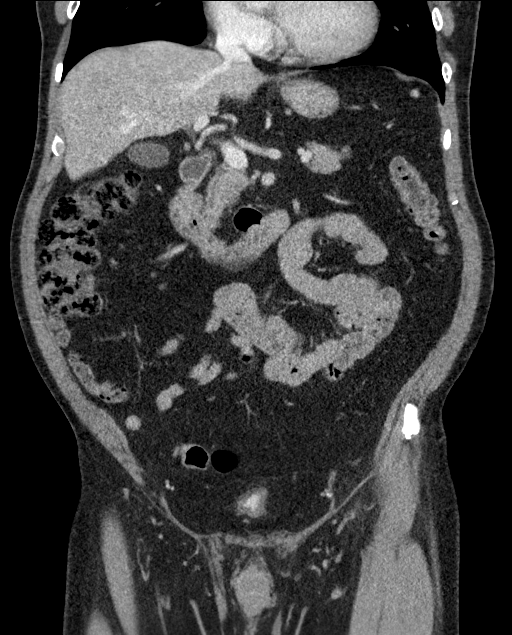
[im 83/149  soft-tissue]
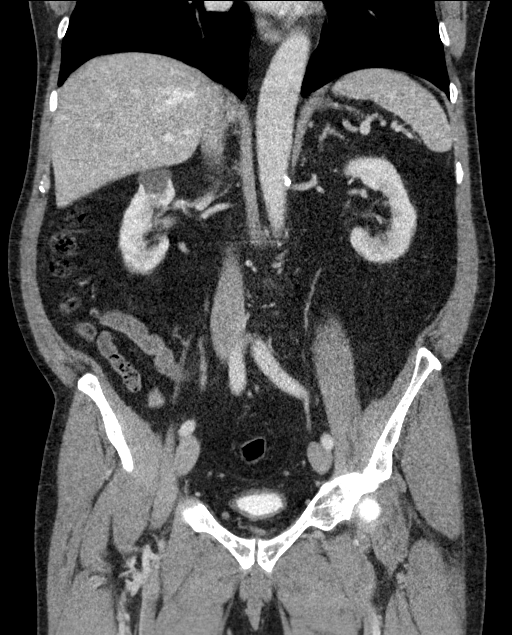

[16 of 46 positions shown; findings below may reference images not displayed]

FINDINGS: CT CHEST FINDINGS

Cardiovascular: No significant vascular findings. Normal heart size.
No pericardial effusion. Thoracic aorta is unremarkable.

Mediastinum/Nodes: No enlarged mediastinal, hilar, or axillary lymph
nodes. Surgical changes of right axillary lymph node dissection are
identified. Thyroid gland, trachea, and esophagus demonstrate no
significant findings.

Lungs/Pleura: Lungs are clear. No pleural effusion or pneumothorax.

Musculoskeletal: Subcentimeter sclerotic focus within the right
eighth rib just beyond the costovertebral junction is stable since
prior examination and likely a benign bone island. No suspicious
lytic or blastic bone lesions are seen within the thorax.

CT ABDOMEN PELVIS FINDINGS

Hepatobiliary: No focal liver abnormality is seen. No gallstones,
gallbladder wall thickening, or biliary dilatation.

Pancreas: Unremarkable

Spleen: Unremarkable

Adrenals/Urinary Tract: The adrenal glands are unremarkable. Simple
cortical cysts again noted within the kidneys. The kidneys are
otherwise unremarkable. The bladder is unremarkable.

Stomach/Bowel: The stomach, small bowel, and large bowel are
unremarkable. Appendix normal. No free intraperitoneal gas or fluid.

Vascular/Lymphatic: Mild aorta atherosclerotic calcification noted.
No aneurysm. No pathologic adenopathy within the abdomen and pelvis.

Reproductive: Prostate is unremarkable.

Other: Rectum unremarkable.  Tiny fat containing umbilical hernia.

Musculoskeletal: Degenerative changes are seen within the lumbar
spine. Vertebral hemangioma noted within the L2 vertebral body. No
focal suspicious lytic or blastic bone lesions are seen.
IMPRESSION: 1. No evidence of recurrent or metastatic disease within the chest,
abdomen, or pelvis.
2. Aortic atherosclerosis.

Aortic Atherosclerosis ([VP]-[VP]).

## 2020-08-02 IMAGING — MR MR HEAD WO/W CM
12 of 14 series · 37 of 48 positions shown · IV contrast (gadavist)
Comparison: Head CT [DATE].
COMPARISON: Head CT [DATE].

Addendum:
CLINICAL DATA: 64-year-old male with lower extremity weakness and
evidence of right hemisphere mass with vasogenic edema on
noncontrast head CT yesterday.

EXAM:
MRI HEAD WITHOUT AND WITH CONTRAST
TECHNIQUE: Multiplanar, multiecho pulse sequences of the brain and surrounding
structures were obtained without and with intravenous contrast.
CONTRAST:  7.5mL GADAVIST GADOBUTROL 1 MMOL/ML IV SOLN

[Series 2: DWI · axial · 3.0mm · 1.20mm/px · z∈[-77,+83]mm · 6 of 108 slices shown (1 of 4)]
[im 1/108]
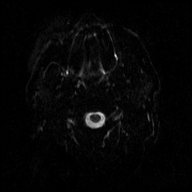
[im 22/108]
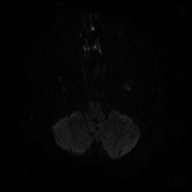
[im 43/108]
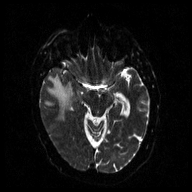
[im 65/108]
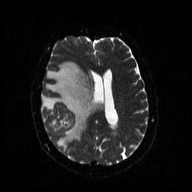
[im 86/108]
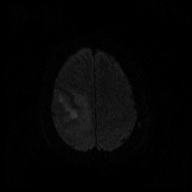
[im 108/108]
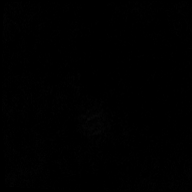

[Series 3: DWI · axial · 3.0mm · 1.20mm/px · z∈[-77,+83]mm · 3 of 55 slices shown (2 of 4)]
[im 1/55]
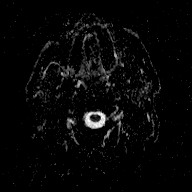
[im 28/55]
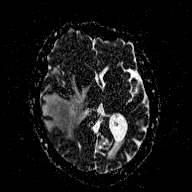
[im 55/55]
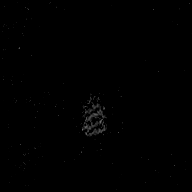

[Series 4: DWI · coronal · 3.0mm · 1.15mm/px · 5 of 90 slices shown (3 of 4)]
[im 1/90]
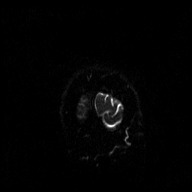
[im 23/90]
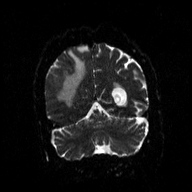
[im 45/90]
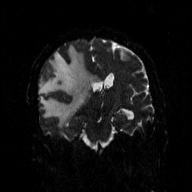
[im 67/90]
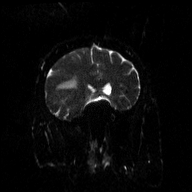
[im 90/90]
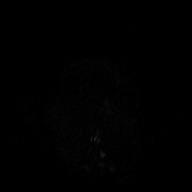

[Series 5: DWI · coronal · 3.0mm · 1.15mm/px · 2 of 45 slices shown (4 of 4)]
[im 1/45]
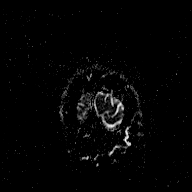
[im 45/45]
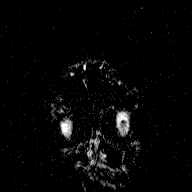

[Series 7: T2 · axial · 5.0mm · 0.72mm/px · 1 of 23 slices shown (1 of 2)]
[im 1/23]
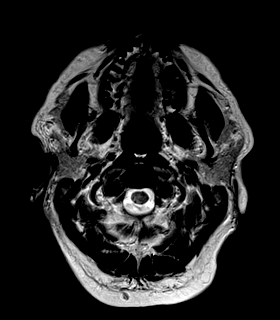

[Series 8: FLAIR · axial · 3.0mm · 0.45mm/px · z∈[-71,+91]mm · 3 of 55 slices shown (1 of 2)]
[im 1/55]
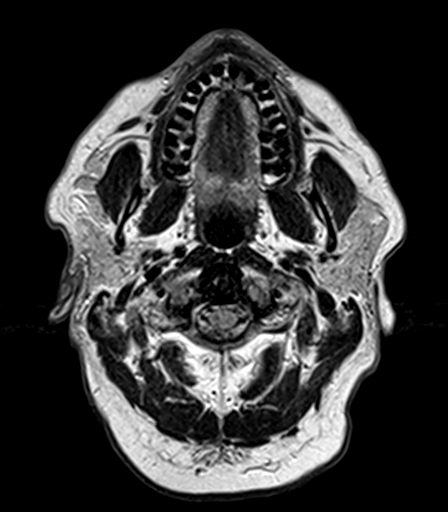
[im 28/55]
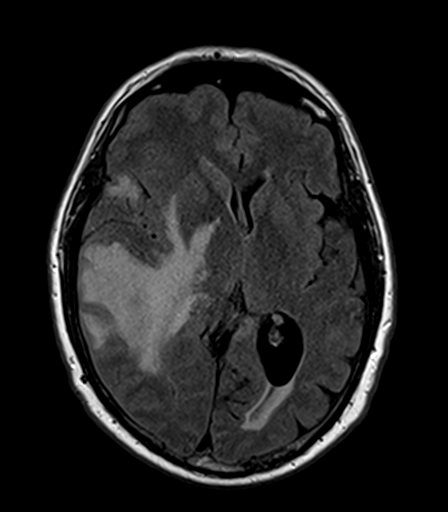
[im 55/55]
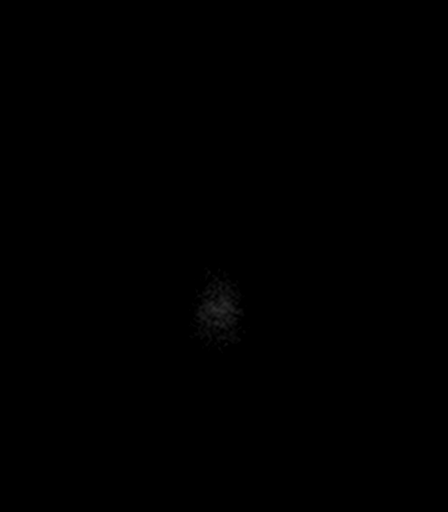

[Series 9: T2 · axial · 5.0mm · 0.72mm/px · 1 of 23 slices shown (2 of 2)]
[im 1/23]
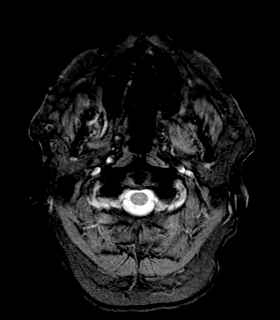

[Series 11: FLAIR · axial · 1.2mm · 0.47mm/px · z∈[-64,+86]mm · 3 of 64 slices shown (2 of 2)]
[im 1/64]
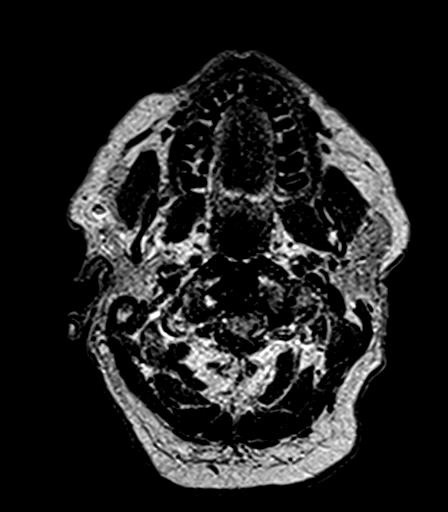
[im 32/64]
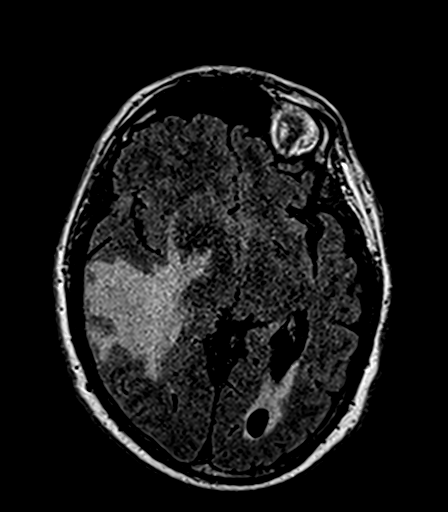
[im 64/64]
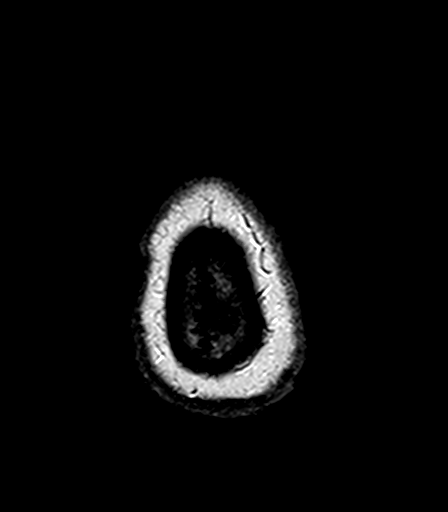

[Series 13: T2 post-contrast · coronal · 5.0mm · 0.43mm/px · 2 of 31 slices shown]
[im 1/31]
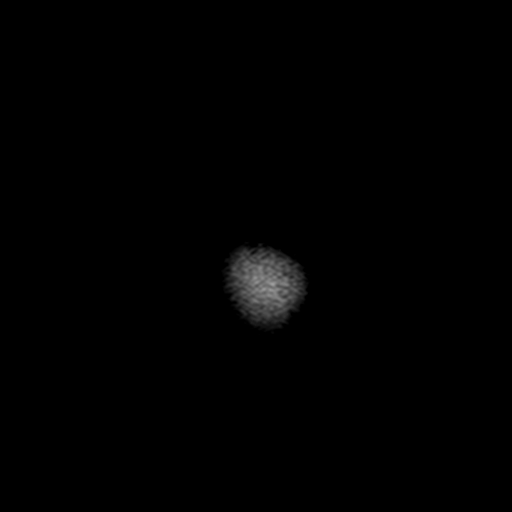
[im 31/31]
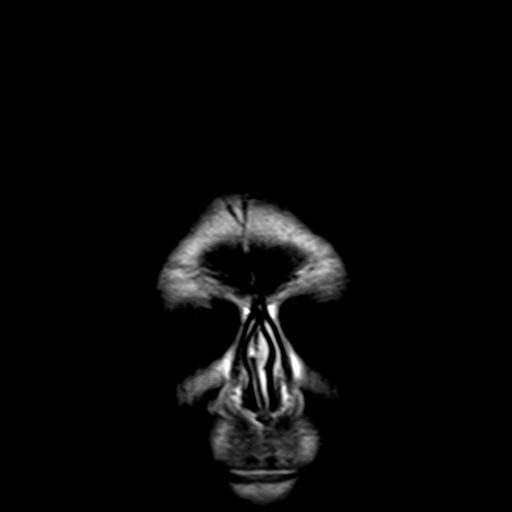

[Series 14: T1 post-contrast · axial · 1.0mm · 1.00mm/px · z∈[-42,+117]mm · 8 of 160 slices shown (1 of 3)]
[im 1/160]
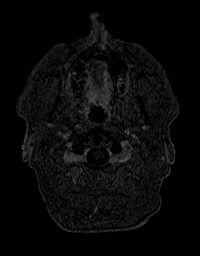
[im 20/160]
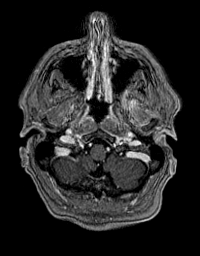
[im 40/160]
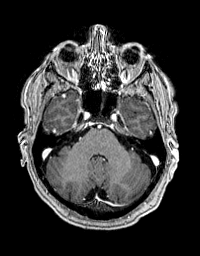
[im 60/160]
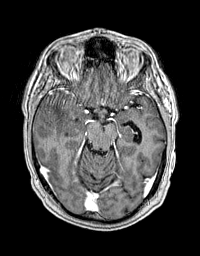
[im 100/160]
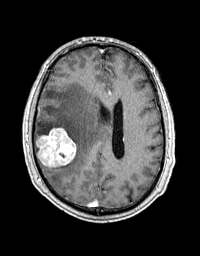
[im 120/160]
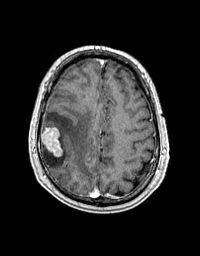
[im 140/160]
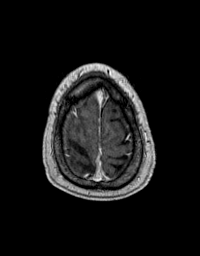
[im 160/160]
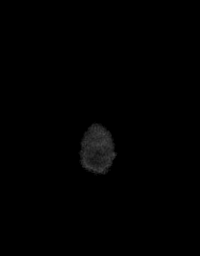

[Series 15: T1 post-contrast · coronal · 5.0mm · 0.43mm/px · 2 of 31 slices shown (2 of 3)]
[im 1/31]
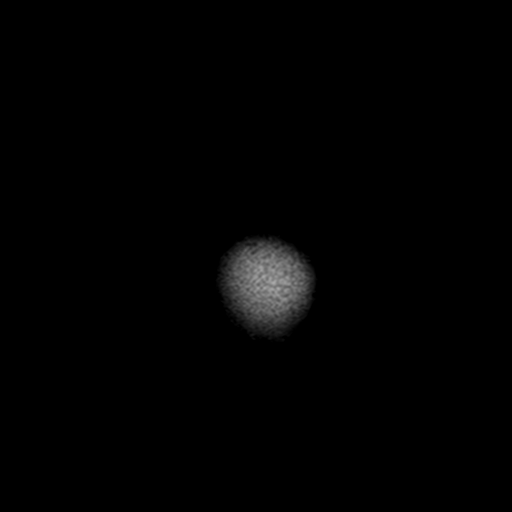
[im 31/31]
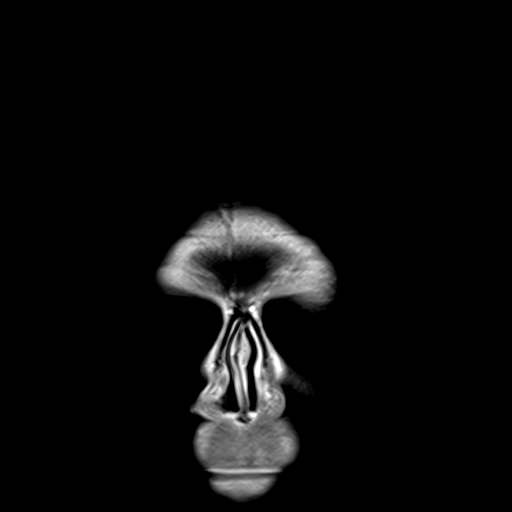

[Series 16: T1 post-contrast · sagittal · 5.0mm · 0.45mm/px · 1 of 23 slices shown (3 of 3)]
[im 1/23]
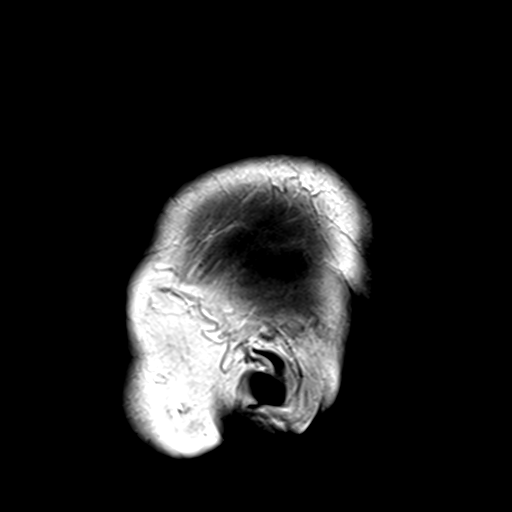

[37 of 48 positions shown; findings below may reference images not displayed]

FINDINGS: Brain: Large lobulated and enhancing mass in the right hemisphere
measures up to 49 mm long axis (series 16, image 5) with extensive
regional T2 and FLAIR hyperintense vasogenic edema, corresponding to
the head CT finding yesterday. No evidence of mineralization or
blood products within the mass. It appears to be intra-axial, with
no definite cortex displaced medial to the mass. And no other
enhancing brain lesion.

Intracranial mass effect with leftward midline shift up to 11 mm.
Subtotal effacement of the right lateral ventricle and mild trapping
of the left lateral ventricle with occipital horn transependymal
edema (series 8 image 26). Basilar cisterns remain patent.

No superimposed restricted diffusion to suggest acute infarction. No
extra-axial collection or acute intracranial hemorrhage.
Cervicomedullary junction and pituitary are within normal limits.
Background cerebral gray and white matter signal appears normal. No
encephalomalacia or chronic cerebral blood products identified. No
definite dural thickening.

Vascular: Major intracranial vascular flow voids are preserved. The
major dural venous sinuses are enhancing and appear to be patent.

Skull and upper cervical spine: Negative visible cervical spine and
spinal cord.

In general bone marrow signal is within normal limits. But there is
a small T1 hypointense and enhancing lesion of the left parietal
bone (6 mm series 14, image 126) with faint suspicious diffusion
signal changes on series 4, image 61. No other suspicious marrow
lesion is identified.

Sinuses/Orbits: Negative.

Other: Mastoids are clear. Visible internal auditory structures
appear normal.
IMPRESSION: 1. Large, large, 49 mm enhancing mass in the right hemisphere with
extensive regional vasogenic edema, corresponding to the head CT
finding yesterday.
I favor the masses intra-axial, with top differential considerations
of solitary Metastasis (see #3) versus High-grade primary tumor. An
extra-axial Meningioma is felt less likely.

2. Intracranial mass effect with leftward midline shift up to 11 mm.
Mild trapping of the left lateral ventricle with transependymal
edema.
Recommend Neurosurgery consultation if not already done.

3. Small 6 mm enhancing lesion in the left parietal bone is
indeterminate, but suspicious for a small skull metastasis if the
lesion in #1 is determined to be metastatic.

ADDENDUM:
Study discussed by telephone with Dr. JILL on [DATE] at
He also advises that the patient had a melanoma treated with
resection last year, although subsequent CT Chest, Abdomen, and
Pelvis were negative for metastatic disease at that time.

*** End of Addendum ***
FINDINGS: Brain: Large lobulated and enhancing mass in the right hemisphere
measures up to 49 mm long axis (series 16, image 5) with extensive
regional T2 and FLAIR hyperintense vasogenic edema, corresponding to
the head CT finding yesterday. No evidence of mineralization or
blood products within the mass. It appears to be intra-axial, with
no definite cortex displaced medial to the mass. And no other
enhancing brain lesion.

Intracranial mass effect with leftward midline shift up to 11 mm.
Subtotal effacement of the right lateral ventricle and mild trapping
of the left lateral ventricle with occipital horn transependymal
edema (series 8 image 26). Basilar cisterns remain patent.

No superimposed restricted diffusion to suggest acute infarction. No
extra-axial collection or acute intracranial hemorrhage.
Cervicomedullary junction and pituitary are within normal limits.
Background cerebral gray and white matter signal appears normal. No
encephalomalacia or chronic cerebral blood products identified. No
definite dural thickening.

Vascular: Major intracranial vascular flow voids are preserved. The
major dural venous sinuses are enhancing and appear to be patent.

Skull and upper cervical spine: Negative visible cervical spine and
spinal cord.

In general bone marrow signal is within normal limits. But there is
a small T1 hypointense and enhancing lesion of the left parietal
bone (6 mm series 14, image 126) with faint suspicious diffusion
signal changes on series 4, image 61. No other suspicious marrow
lesion is identified.

Sinuses/Orbits: Negative.

Other: Mastoids are clear. Visible internal auditory structures
appear normal.
IMPRESSION: 1. Large, large, 49 mm enhancing mass in the right hemisphere with
extensive regional vasogenic edema, corresponding to the head CT
finding yesterday.
I favor the masses intra-axial, with top differential considerations
of solitary Metastasis (see #3) versus High-grade primary tumor. An
extra-axial Meningioma is felt less likely.

2. Intracranial mass effect with leftward midline shift up to 11 mm.
Mild trapping of the left lateral ventricle with transependymal
edema.
Recommend Neurosurgery consultation if not already done.

3. Small 6 mm enhancing lesion in the left parietal bone is
indeterminate, but suspicious for a small skull metastasis if the
lesion in #1 is determined to be metastatic.

## 2020-08-02 MED ORDER — LEVETIRACETAM 500 MG PO TABS
500.0000 mg | ORAL_TABLET | Freq: Two times a day (BID) | ORAL | Status: DC
  Administered 2020-08-02 – 2020-08-05 (×6): 500 mg via ORAL
  Filled 2020-08-02 (×5): qty 1

## 2020-08-02 MED ORDER — DEXAMETHASONE SODIUM PHOSPHATE 4 MG/ML IJ SOLN
4.0000 mg | Freq: Four times a day (QID) | INTRAMUSCULAR | Status: DC
  Administered 2020-08-02 – 2020-08-05 (×11): 4 mg via INTRAVENOUS
  Filled 2020-08-02 (×11): qty 1

## 2020-08-02 MED ORDER — GADOBUTROL 1 MMOL/ML IV SOLN
7.5000 mL | Freq: Once | INTRAVENOUS | Status: AC | PRN
  Administered 2020-08-02: 7.5 mL via INTRAVENOUS

## 2020-08-02 MED ORDER — IOHEXOL 300 MG/ML  SOLN
100.0000 mL | Freq: Once | INTRAMUSCULAR | Status: AC | PRN
  Administered 2020-08-02: 100 mL via INTRAVENOUS

## 2020-08-02 NOTE — Telephone Encounter (Signed)
Spoke with radiologist who read the brain MRI. Based on the edema and degree of shift he recommends more urgent neurosurgery evaluation and management.  Agrees with dexamethasone already starting.  Urgent referral to Neurosurgery was placed yesterday.  I spoke with on call Neurosurgeon who recommend pt go to the ED to get started on more medicine.  Discussed with pt with agrees to go to ED

## 2020-08-02 NOTE — Telephone Encounter (Signed)
Spoke with Derm.  Carlynn Herald Pa Stage 4 melanoma March 2020 initial dept of 65mm with negative margins and a negative sentinel node biopsy and neg Ct abd initially.   Will fax note to  Payton Mccallum [phone] care of [place]   Will also send info to Dr Alen Blew oncologist involved in his care last year.    Phone to derm is [phone]

## 2020-08-02 NOTE — ED Triage Notes (Signed)
Pt reports his doctor told him to come into the ED for his train tumor that looked like it was pressing on something "important"   Pt took 3 pills of decadron before arrival.   Pt hx of dizziness/ falls since June  no head pain    VSS, a&ox4.

## 2020-08-02 NOTE — Telephone Encounter (Signed)
Scheduled appt per 8/24 sch msg - pt sister is aware of appt date and time

## 2020-08-02 NOTE — ED Provider Notes (Signed)
[facility] EMERGENCY DEPARTMENT Provider Note   CSN: [id number] Arrival date & time: 08/02/20  1823     History Chief Complaint  Patient presents with  . Brain Tumor    Walter Rodriguez is a 64 y.o. male.  Walter Rodriguez is a 64 y.o. male with history of melanoma s/p resection in 2020, who presents to the emergency department at the instruction of his primary care doctor for further evaluation of recently diagnosed brain mass.  Patient saw his PCP on 8/18 for further evaluation of frequent falls and balance issues, a head CT was ordered, which was completed yesterday and showed concern for a brain mass in the right hemispheric.  Neurosurgery was consulted and recommended MRI and started patient on Decadron.  Patient was sent for stat MRI which was completed today and showed a 49 mm mass in the right hemisphere with surrounding vasogenic edema and 11 mm of leftward midline shift.  Given findings on MRI neurosurgery recommended more emergent evaluation in the emergency department for further treatment.  On patient's most recent screening chest abdomen and pelvis he did not have any signs of metastasis since melanoma resection, but concern for potential metastatic disease versus primary brain tumor by neurosurgery and patient sent for further work-up and medications to help control edema.  Patient states he has been experiencing some dizziness and difficulty walking, states he is feeling somewhat better after starting steroids yesterday, denies headaches or visual changes.  No chest pain or shortness of breath.  No abdominal pain.  No nausea or vomiting.  No seizure-like activity.        Past Medical History:  Diagnosis Date  . Melanoma ([place])    back  . Open wound    back    Patient Active Problem List   Diagnosis Date Noted  . Neoplasm of brain causing mass effect on adjacent structures ([place]) 08/02/2020  . Melanoma of trunk ([place]) 04/06/2019    Past Surgical  History:  Procedure Laterality Date  . MELANOMA EXCISION WITH SENTINEL LYMPH NODE BIOPSY N/A 04/01/2019   Procedure: WIDE LOCAL EXCISION OF BACK MELANOMA WITH RIGHT AXILLARY SENTINEL LYMPH NODE BIOPSY;  Surgeon: Stark Klein, MD;  Location: [facility];  Service: General;  Laterality: N/A;  . SKIN SPLIT GRAFT N/A 04/10/2019   Procedure: SPLIT THICKNESS SKIN GRAFT FROM RIGHT  THIGH TO THE BACK;  Surgeon: Irene Limbo, MD;  Location: [facility];  Service: Plastics;  Laterality: N/A;       History reviewed. No pertinent family history.  Social History   Tobacco Use  . Smoking status: Never Smoker  . Smokeless tobacco: Never Used  Vaping Use  . Vaping Use: Never used  Substance Use Topics  . Alcohol use: Never  . Drug use: Never    Home Medications Prior to Admission medications   Medication Sig Start Date End Date Taking? Authorizing Provider  acetaminophen (TYLENOL) 500 MG tablet Take 500 mg by mouth every 6 (six) hours as needed for headache.   Yes [provider]  dexamethasone (DECADRON) 4 MG tablet Take 1 tablet (4 mg total) by mouth 2 (two) times daily with a meal. 08/01/20  Yes Gregor Hams, MD  Multiple Vitamin (MULTI-VITAMIN) tablet Take 1 tablet by mouth daily.   Yes [provider]    Allergies    Patient has no known allergies.  Review of Systems   Review of Systems  Constitutional: Negative for chills and fever.  HENT: Negative.  Respiratory: Negative for cough and shortness of breath.   Cardiovascular: Negative for chest pain.  Gastrointestinal: Negative for abdominal pain, nausea and vomiting.  Musculoskeletal: Negative for arthralgias and myalgias.  Skin: Negative for color change and rash.  Neurological: Positive for dizziness and weakness. Negative for seizures, syncope, facial asymmetry, speech difficulty, light-headedness, numbness and headaches.  All other systems reviewed and are negative.   Physical Exam Updated Vital Signs BP (!)  152/77 (BP Location: Right Arm)   Pulse 66   Temp 98.7 F (37.1 C) (Oral)   Resp 16   Ht 5' 8.5" (1.74 m)   Wt 77.6 kg   SpO2 97%   BMI 25.62 kg/m   Physical Exam Vitals and nursing note reviewed.  Constitutional:      General: He is not in acute distress.    Appearance: Normal appearance. He is well-developed. He is not ill-appearing or diaphoretic.     Comments: Well-appearing and in no acute distress  HENT:     Head: Normocephalic and atraumatic.     Mouth/Throat:     Mouth: Mucous membranes are moist.     Pharynx: Oropharynx is clear.  Eyes:     General:        Right eye: No discharge.        Left eye: No discharge.     Extraocular Movements: Extraocular movements intact.     Pupils: Pupils are equal, round, and reactive to light.  Cardiovascular:     Rate and Rhythm: Normal rate and regular rhythm.     Pulses: Normal pulses.     Heart sounds: Normal heart sounds. No murmur heard.  No friction rub. No gallop.   Pulmonary:     Effort: Pulmonary effort is normal. No respiratory distress.     Breath sounds: Normal breath sounds. No wheezing or rales.     Comments: Respirations equal and unlabored, patient able to speak in full sentences, lungs clear to auscultation bilaterally Abdominal:     General: Bowel sounds are normal. There is no distension.     Palpations: Abdomen is soft. There is no mass.     Tenderness: There is no abdominal tenderness. There is no guarding.     Comments: Abdomen soft, nondistended, nontender to palpation in all quadrants without guarding or peritoneal signs  Musculoskeletal:        General: No deformity.     Cervical back: Neck supple.  Skin:    General: Skin is warm and dry.     Capillary Refill: Capillary refill takes less than 2 seconds.  Neurological:     Mental Status: He is alert.     Coordination: Coordination normal.     Comments: Speech is clear, able to follow commands CN III-XII intact Normal strength in bilateral upper  extremities.  There is some slight weakness in the left lower extremity 4.5/5 when compared to the right Sensation normal to light and sharp touch Moves extremities without ataxia, coordination intact  Psychiatric:        Mood and Affect: Mood normal.        Behavior: Behavior normal.     ED Results / Procedures / Treatments   Labs (all labs ordered are listed, but only abnormal results are displayed) Labs Reviewed  CBC WITH DIFFERENTIAL/PLATELET - Abnormal; Notable for the following components:      Result Value   Monocytes Absolute 1.1 (*)    All other components within normal limits  BASIC METABOLIC PANEL - Abnormal;  Notable for the following components:   Glucose, Bld 107 (*)    All other components within normal limits  SARS CORONAVIRUS 2 BY RT PCR (HOSPITAL ORDER, [facility])    EKG None  Radiology CT HEAD WO CONTRAST  Result Date: 08/01/2020 CLINICAL DATA:  Lower extremity weakness and recent falls EXAM: CT HEAD WITHOUT CONTRAST TECHNIQUE: Contiguous axial images were obtained from the base of the skull through the vertex without intravenous contrast. COMPARISON:  None. FINDINGS: Brain: There is an apparent mass arising at the right frontal-temporal junction measuring 4.5 x 4.0 cm with extensive surrounding vasogenic edema. There is shift of the lateral ventricles from the midline by 1.1 cm. Third ventricle is also shifted to the left. Fourth ventricle is midline. There is no evident hemorrhage. There is no subdural or epidural fluid collection. No acute appearing infarct evident. Vascular: No hyperdense vessel. No appreciable vascular calcification. Skull: The bony calvarium appears intact. Sinuses/Orbits: Mucosal thickening is noted in several ethmoid air cells. Other visualized paranasal sinuses are clear. Visualized orbits appear symmetric bilaterally. Other: Mastoid air cells are clear. There is debris in each external auditory canal, likely due to  cerumen. IMPRESSION: 1. Mass at the junction of the periphery of the frontal and parietal lobes the left measuring 4.5 x 4.0 cm. Extensive surrounding vasogenic edema. 1.1 cm of midline shift the lateral and third ventricles to the left. Fourth ventricle midline. 2.  No hemorrhage or acute infarct. 3. Mucosal thickening in several ethmoid air cells. Probable cerumen in each external auditory canal. These results will be called to the ordering clinician or representative by the Radiologist Assistant, and communication documented in the PACS or [employer]. Electronically Signed   By: Lowella Grip III M.D.   On: 08/01/2020 13:07   MR BRAIN W WO CONTRAST  Addendum Date: 08/02/2020   ADDENDUM REPORT: 08/02/2020 17:15 ADDENDUM: Study discussed by telephone with Dr. Lynne Leader on 08/02/2020 at 17:13. He also advises that the patient had a melanoma treated with resection last year, although subsequent CT Chest, Abdomen, and Pelvis were negative for metastatic disease at that time. Electronically Signed   By: Genevie Ann M.D.   On: 08/02/2020 17:15   Result Date: 08/02/2020 CLINICAL DATA:  64 year old male with lower extremity weakness and evidence of right hemisphere mass with vasogenic edema on noncontrast head CT yesterday. EXAM: MRI HEAD WITHOUT AND WITH CONTRAST TECHNIQUE: Multiplanar, multiecho pulse sequences of the brain and surrounding structures were obtained without and with intravenous contrast. CONTRAST:  7.28mL GADAVIST GADOBUTROL 1 MMOL/ML IV SOLN COMPARISON:  Head CT 08/01/2020. FINDINGS: Brain: Large lobulated and enhancing mass in the right hemisphere measures up to 49 mm long axis (series 16, image 5) with extensive regional T2 and FLAIR hyperintense vasogenic edema, corresponding to the head CT finding yesterday. No evidence of mineralization or blood products within the mass. It appears to be intra-axial, with no definite cortex displaced medial to the mass. And no other enhancing brain  lesion. Intracranial mass effect with leftward midline shift up to 11 mm. Subtotal effacement of the right lateral ventricle and mild trapping of the left lateral ventricle with occipital horn transependymal edema (series 8 image 26). Basilar cisterns remain patent. No superimposed restricted diffusion to suggest acute infarction. No extra-axial collection or acute intracranial hemorrhage. Cervicomedullary junction and pituitary are within normal limits. Background cerebral gray and white matter signal appears normal. No encephalomalacia or chronic cerebral blood products identified. No definite dural thickening.  Vascular: Major intracranial vascular flow voids are preserved. The major dural venous sinuses are enhancing and appear to be patent. Skull and upper cervical spine: Negative visible cervical spine and spinal cord. In general bone marrow signal is within normal limits. But there is a small T1 hypointense and enhancing lesion of the left parietal bone (6 mm series 14, image 126) with faint suspicious diffusion signal changes on series 4, image 61. No other suspicious marrow lesion is identified. Sinuses/Orbits: Negative. Other: Mastoids are clear. Visible internal auditory structures appear normal. IMPRESSION: 1. Large, large, 49 mm enhancing mass in the right hemisphere with extensive regional vasogenic edema, corresponding to the head CT finding yesterday. I favor the masses intra-axial, with top differential considerations of solitary Metastasis (see #3) versus High-grade primary tumor. An extra-axial Meningioma is felt less likely. 2. Intracranial mass effect with leftward midline shift up to 11 mm. Mild trapping of the left lateral ventricle with transependymal edema. Recommend Neurosurgery consultation if not already done. 3. Small 6 mm enhancing lesion in the left parietal bone is indeterminate, but suspicious for a small skull metastasis if the lesion in #1 is determined to be metastatic.  Electronically Signed: By: Genevie Ann M.D. On: 08/02/2020 16:59    Procedures Procedures (including critical care time)  Medications Ordered in ED Medications  dexamethasone (DECADRON) injection 4 mg (4 mg Intravenous Given 08/02/20 2113)  levETIRAcetam (KEPPRA) tablet 500 mg (500 mg Oral Given 08/02/20 2113)  iohexol (OMNIPAQUE) 300 MG/ML solution 100 mL (100 mLs Intravenous Contrast Given 08/02/20 2112)    ED Course  I have reviewed the triage vital signs and the nursing notes.  Pertinent labs & imaging results that were available during my care of the patient were reviewed by me and considered in my medical decision making (see chart for details).    MDM Rules/Calculators/A&P                          64 year old male with history of melanoma sent for evaluation after patient was found to have 4.9 cm brain mass in the right hemisphere on outpatient MRI that was ordered for evaluation of frequent falls and balance issues.  Neurosurgery recommended patient immediately present to ED for further evaluation given noted vasogenic edema and midline shift from mass.  Patient has some left lower extremity weakness noted on exam, no other focal neurologic symptoms.  I consulted and spoke with PA Ferne Reus with neurosurgery who will start patient on Keppra and manage steroid dosing but requests CT chest abdomen pelvis to look for further evidence of metastasis, and request medicine admission  I discussed this plan with patient and wife who are in agreement.  Basic labs ordered without significant electrolyte derangements, no leukocytosis, normal hemoglobin.  CT chest abdomen pelvis ordered and consult placed for medicine admission.  Case discussed with Dr. Tonie Griffith with [facility] hospitalist who will see and admit the patient.  Final Clinical Impression(s) / ED Diagnoses Final diagnoses:  Brain mass  Vasogenic brain edema [facility])    Rx / DC Orders ED Discharge Orders    None       Janet Berlin 08/02/20 2213    Daleen Bo, MD 08/02/20 2219

## 2020-08-02 NOTE — Telephone Encounter (Signed)
Carlynn Herald from [facility] called asking to speak to Dr Georgina Snell regarding this patient.   She can be reached at [phone].

## 2020-08-02 NOTE — ED Provider Notes (Signed)
  Face-to-face evaluation   History: Patient presents for evaluation of abnormal CT and MRI of the head, and a neck tumor.  This was found in the course of work-up for dizziness.  Physical exam: Alert, calm, cooperative.  No dysarthria or aphasia.  Normal motion of arms and legs bilaterally.  No significant discoordination on limited exam.  Medical screening examination/treatment/procedure(s) were conducted as a shared visit with non-physician practitioner(s) and myself.  I personally evaluated the patient during the encounter    Daleen Bo, MD 08/02/20 2219

## 2020-08-02 NOTE — H&P (Signed)
History and Physical    Walter Rodriguez YHC:[id number] DOB: 1956-05-28 DOA: 08/02/2020  PCP: [name], MD   Patient coming from:   Home  Chief Complaint: Brain mass on CT scan  HPI: Walter Rodriguez is a 64 y.o. male with no chronic medical problems that he does not take any chronic medications.  He was diagnosed with melanoma a year ago and had a melanoma surgically removed from his upper central back a year ago.  For the last 6 weeks he has been having difficulty with walking being off balance and a shuffling gait.  He has also had some weakness in his left leg.  A few days ago he was walking in the woods when he fell and was unable to get up and called his family to come and get him.  After that episode he did decided he would go for evaluation and saw his PCP and had a CT scan of the head which revealed a brain tumor.  He was then referred for an MRI which shows brain tumor with surrounding edema and some midline shift.  He was started on Decadron by his outpatient physician yesterday due to the edema around the mass in his brain on CT scan.  States he has not had any fever, chills, cough, chest pain, palpitations, urinary hesitancy, frequency or incontinence.  He has not had any abdominal pain, nausea, vomiting, diarrhea.  ED Course:   ER provider consulted neurosurgery who requested a CT of the chest abdomen and pelvis be performed and for medicine to admit patient for further work-up.  Neurosurgery stated they would prescribe and manage the steroids and will start patient on Keppra.  Review of Systems:  General: Ports left leg weakness.  Denies fever, chills, weight loss, night sweats.  Denies dizziness.  Denies change in appetite HENT: Denies head trauma, headache, denies change in hearing, tinnitus.  Denies nasal congestion or bleeding.  Denies sore throat, sores in mouth.  Denies difficulty swallowing Eyes: Denies blurry vision, pain in eye, drainage.  Denies discoloration of  eyes. Neck: Denies pain.  Denies swelling.  Denies pain with movement. Cardiovascular: Denies chest pain, palpitations.  Denies edema.  Denies orthopnea Respiratory: Denies shortness of breath, cough.  Denies wheezing.  Denies sputum production Gastrointestinal: Denies abdominal pain, swelling.  Denies nausea, vomiting, diarrhea.  Denies melena.  Denies hematemesis. Musculoskeletal: Denies limitation of movement.  Denies deformity or swelling.  Denies pain.  Denies arthralgias or myalgias. Genitourinary: Denies pelvic pain.  Denies urinary frequency or hesitancy.  Denies dysuria.  Skin: Denies rash.  Denies petechiae, purpura, ecchymosis. Neurological: Denies headache.  Denies syncope.  Denies seizure activity.  Reports shuffling gait and being off balance with ataxia.  Denies slurred speech, drooping face.  Denies visual change. Psychiatric: Denies depression, anxiety.  Denies suicidal thoughts or ideation.  Denies hallucinations.  Past Medical History:  Diagnosis Date  . Melanoma (Walter Rodriguez)    back  . Open wound    back    Past Surgical History:  Procedure Laterality Date  . MELANOMA EXCISION WITH SENTINEL LYMPH NODE BIOPSY N/A 04/01/2019   Procedure: WIDE LOCAL EXCISION OF BACK MELANOMA WITH RIGHT AXILLARY SENTINEL LYMPH NODE BIOPSY;  Surgeon: Stark Klein, MD;  Location: [facility];  Service: General;  Laterality: N/A;  . SKIN SPLIT GRAFT N/A 04/10/2019   Procedure: SPLIT THICKNESS SKIN GRAFT FROM RIGHT  THIGH TO THE BACK;  Surgeon: Irene Limbo, MD;  Location: [facility];  Service: Plastics;  Laterality: N/A;  Social History  reports that he has never smoked. He has never used smokeless tobacco. He reports that he does not drink alcohol and does not use drugs.  No Known Allergies  History reviewed. No pertinent family history.   Prior to Admission medications   Medication Sig Start Date End Date Taking? Authorizing Provider  acetaminophen (TYLENOL) 500 MG tablet Take 500 mg by mouth  every 6 (six) hours as needed for headache.   Yes [provider]  dexamethasone (DECADRON) 4 MG tablet Take 1 tablet (4 mg total) by mouth 2 (two) times daily with a meal. 08/01/20  Yes Gregor Hams, MD  Multiple Vitamin (MULTI-VITAMIN) tablet Take 1 tablet by mouth daily.   Yes [provider]    Physical Exam: Vitals:   08/02/20 1854  BP: (!) 152/77  Pulse: 66  Resp: 16  Temp: 98.7 F (37.1 C)  TempSrc: Oral  SpO2: 97%  Weight: 77.6 kg  Height: 5' 8.5" (1.74 m)    Constitutional: NAD, calm, comfortable Vitals:   08/02/20 1854  BP: (!) 152/77  Pulse: 66  Resp: 16  Temp: 98.7 F (37.1 C)  TempSrc: Oral  SpO2: 97%  Weight: 77.6 kg  Height: 5' 8.5" (1.74 m)   General: WDWN, Alert and oriented x3.  Eyes: EOMI, PERRL, lids and conjunctivae normal.  Sclera nonicteric HENT:  /AT, external ears normal.  Nares patent without epistasis.  Mucous membranes are moist. Posterior pharynx clear of any exudate or lesions. Normal dentition.  Neck: Soft, normal range of motion, supple, no masses, no thyromegaly.  Trachea midline Respiratory: clear to auscultation bilaterally, no wheezing, no crackles. Normal respiratory effort. No accessory muscle use.  Cardiovascular: Regular rate and rhythm, no murmurs / rubs / gallops. No extremity edema. 2+ pedal pulses. No carotid bruits.  Abdomen: Soft, no tenderness, nondistended, no rebound or guarding.  No masses palpated. No hepatosplenomegaly. Bowel sounds normoactive Musculoskeletal: FROM. no clubbing / cyanosis. No joint deformity upper and lower extremities. no contractures. Normal muscle tone.  Skin: Warm, dry, intact no rashes, lesions, ulcers. No induration Neurologic: CN 2-12 grossly intact.  Normal speech.  Sensation intact, patella DTR +1 bilaterally.  Strength in left lower extremity is 4-5.  Strength is 5-5 in all extremities.  No drift noted. Psychiatric: Normal judgment and insight.  Normal mood.    Labs on  Admission: I have personally reviewed following labs and imaging studies  CBC: Recent Labs  Lab 07/27/20 0836 08/02/20 1916  WBC 8.2 10.1  NEUTROABS 6,166 7.0  HGB 15.7 14.4  HCT 48.2 45.6  MCV 84.0 85.4  PLT 280 629    Basic Metabolic Panel: Recent Labs  Lab 07/27/20 0836 08/02/20 1916  NA 140 141  K 4.0 3.9  CL 103 109  CO2 27 24  GLUCOSE 128* 107*  BUN 15 18  CREATININE 1.19 1.20  CALCIUM 9.5 9.0    GFR: Estimated Creatinine Clearance: 59.6 mL/min (by C-G formula based on SCr of 1.2 mg/dL).  Liver Function Tests: Recent Labs  Lab 07/27/20 0836  AST 23  ALT 20  BILITOT 0.7  PROT 6.9    Urine analysis: No results found for: COLORURINE, APPEARANCEUR, LABSPEC, PHURINE, GLUCOSEU, HGBUR, BILIRUBINUR, KETONESUR, PROTEINUR, UROBILINOGEN, NITRITE, LEUKOCYTESUR  Radiological Exams on Admission: CT HEAD WO CONTRAST  Result Date: 08/01/2020 CLINICAL DATA:  Lower extremity weakness and recent falls EXAM: CT HEAD WITHOUT CONTRAST TECHNIQUE: Contiguous axial images were obtained from the base of the skull through the vertex without  intravenous contrast. COMPARISON:  None. FINDINGS: Brain: There is an apparent mass arising at the right frontal-temporal junction measuring 4.5 x 4.0 cm with extensive surrounding vasogenic edema. There is shift of the lateral ventricles from the midline by 1.1 cm. Third ventricle is also shifted to the left. Fourth ventricle is midline. There is no evident hemorrhage. There is no subdural or epidural fluid collection. No acute appearing infarct evident. Vascular: No hyperdense vessel. No appreciable vascular calcification. Skull: The bony calvarium appears intact. Sinuses/Orbits: Mucosal thickening is noted in several ethmoid air cells. Other visualized paranasal sinuses are clear. Visualized orbits appear symmetric bilaterally. Other: Mastoid air cells are clear. There is debris in each external auditory canal, likely due to cerumen. IMPRESSION: 1.  Mass at the junction of the periphery of the frontal and parietal lobes the left measuring 4.5 x 4.0 cm. Extensive surrounding vasogenic edema. 1.1 cm of midline shift the lateral and third ventricles to the left. Fourth ventricle midline. 2.  No hemorrhage or acute infarct. 3. Mucosal thickening in several ethmoid air cells. Probable cerumen in each external auditory canal. These results will be called to the ordering clinician or representative by the Radiologist Assistant, and communication documented in the PACS or [employer]. Electronically Signed   By: Lowella Grip III M.D.   On: 08/01/2020 13:07   MR BRAIN W WO CONTRAST  Addendum Date: 08/02/2020   ADDENDUM REPORT: 08/02/2020 17:15 ADDENDUM: Study discussed by telephone with Dr. Lynne Leader on 08/02/2020 at 17:13. He also advises that the patient had a melanoma treated with resection last year, although subsequent CT Chest, Abdomen, and Pelvis were negative for metastatic disease at that time. Electronically Signed   By: Genevie Ann M.D.   On: 08/02/2020 17:15   Result Date: 08/02/2020 CLINICAL DATA:  65 year old male with lower extremity weakness and evidence of right hemisphere mass with vasogenic edema on noncontrast head CT yesterday. EXAM: MRI HEAD WITHOUT AND WITH CONTRAST TECHNIQUE: Multiplanar, multiecho pulse sequences of the brain and surrounding structures were obtained without and with intravenous contrast. CONTRAST:  7.90mL GADAVIST GADOBUTROL 1 MMOL/ML IV SOLN COMPARISON:  Head CT 08/01/2020. FINDINGS: Brain: Large lobulated and enhancing mass in the right hemisphere measures up to 49 mm long axis (series 16, image 5) with extensive regional T2 and FLAIR hyperintense vasogenic edema, corresponding to the head CT finding yesterday. No evidence of mineralization or blood products within the mass. It appears to be intra-axial, with no definite cortex displaced medial to the mass. And no other enhancing brain lesion. Intracranial mass  effect with leftward midline shift up to 11 mm. Subtotal effacement of the right lateral ventricle and mild trapping of the left lateral ventricle with occipital horn transependymal edema (series 8 image 26). Basilar cisterns remain patent. No superimposed restricted diffusion to suggest acute infarction. No extra-axial collection or acute intracranial hemorrhage. Cervicomedullary junction and pituitary are within normal limits. Background cerebral gray and white matter signal appears normal. No encephalomalacia or chronic cerebral blood products identified. No definite dural thickening. Vascular: Major intracranial vascular flow voids are preserved. The major dural venous sinuses are enhancing and appear to be patent. Skull and upper cervical spine: Negative visible cervical spine and spinal cord. In general bone marrow signal is within normal limits. But there is a small T1 hypointense and enhancing lesion of the left parietal bone (6 mm series 14, image 126) with faint suspicious diffusion signal changes on series 4, image 61. No other suspicious marrow lesion is identified.  Sinuses/Orbits: Negative. Other: Mastoids are clear. Visible internal auditory structures appear normal. IMPRESSION: 1. Large, large, 49 mm enhancing mass in the right hemisphere with extensive regional vasogenic edema, corresponding to the head CT finding yesterday. I favor the masses intra-axial, with top differential considerations of solitary Metastasis (see #3) versus High-grade primary tumor. An extra-axial Meningioma is felt less likely. 2. Intracranial mass effect with leftward midline shift up to 11 mm. Mild trapping of the left lateral ventricle with transependymal edema. Recommend Neurosurgery consultation if not already done. 3. Small 6 mm enhancing lesion in the left parietal bone is indeterminate, but suspicious for a small skull metastasis if the lesion in #1 is determined to be metastatic. Electronically Signed: By: Genevie Ann  M.D. On: 08/02/2020 16:59     Assessment/Plan Active Problems:   Neoplasm of brain causing mass effect on adjacent structures Walter Rodriguez is admitted to medical/surgical floor. neurosurgery was consulted in the emergency room and they requested medicine admit patient.  Neurosurgery requested a CT of the chest abdomen and pelvis be performed to look for other lesions that may be present. Neurosurgery has ordered Decadron and Keppra     Ataxia Patient replaced on fall precautions.  We will consult PT in the morning for evaluation.    DVT prophylaxis: Had a score is low.  SCDs for DVT prophylaxis.   Code Status:   Full code Family Communication:  Diagnosis plan discussed with patient and sister who is in room with him.  Patient transferred.  Patient agrees with plan and verbally understands it.  Further recommendation follow as clinically indicated Disposition Plan:   Patient is from:  Home  Anticipated DC to:  Home  Anticipated DC date:  Anticipate more than 2 midnight stay in the hospital to evaluate and treat medical condition  Anticipated DC barriers: No barriers to discharge identified at this time  Consults called:  Neurosurgery Admission status:  Inpatient  Severity of Illness: The appropriate patient status for this patient is INPATIENT. Inpatient status is judged to be reasonable and necessary in order to provide the required intensity of service to ensure the patient's safety. The patient's presenting symptoms, physical exam findings, and initial radiographic and laboratory data in the context of their chronic comorbidities is felt to place them at high risk for further clinical deterioration. Furthermore, it is not anticipated that the patient will be medically stable for discharge from the hospital within 2 midnights of admission. The following factors support the patient status of inpatient.   * I certify that at the point of admission it is my clinical judgment that  the patient will require inpatient hospital care spanning beyond 2 midnights from the point of admission due to high intensity of service, high risk for further deterioration and high frequency of surveillance required.Yevonne Aline Haizel Gatchell MD [facility]  How to contact the [facility] Attending or Consulting provider [place] or covering provider during after hours [place], for this patient?   1. Check the care team in [facility] and look for a) attending/consulting [facility] provider listed and b) the [facility] team listed 2. Log into [email or web address] and use [place]'s universal password to access. If you do not have the password, please contact the hospital operator. 3. Locate the [facility] provider you are looking for under [facility] and page to a number that you can be directly reached. 4. If you still have difficulty reaching the provider, please page the [facility] (Director on Call)  for the Hospitalists listed on amion for assistance.  08/02/2020, 9:05 PM

## 2020-08-02 NOTE — Consult Note (Addendum)
 Chief Complaint   Chief Complaint  Patient presents with  . Brain Tumor    HPI   Consult requested by: Kelsey Ford PA-C, EDP [facility] Reason for consult: Brain tumor  HPI: Walter Rodriguez is a 64 y.o. male with history of melanoma s/p excision in 2020 who was found to have a large right hemispheric brain mass with significant vasogenic edema during work up for gait instability, left sided weakness and memory difficulties. I was called by the ordering physician,  Dr Corey, who had been working this up and recommended the patient come to the ED for further evaluation and admission. Dr Corey had started him on Decadron 4mg BID yesterday after initial head CT, prior to MRI today. I was called upon patient's arrival. His sister is present with him at bedside. Since late Spring/early summer, patient has been having difficulties with left sided weakness and resultant difficulties with gait. His sister had been insisting he get checked out, but he had been refusing. He has fallen multiple times to gait difficulties, mostly because he feels as though his left leg has been "dragging". He has been having difficulties with LUE weakness as well, including difficulties with fine motor skills. Over the last 2 weeks, sister has noticed difficulties with short term memory, which is unusual for him as he is usually very sharp. She was finally able to convince him to come be evaluated. He had been having headaches and dizziness since spring that resolved after starting steroids. No changes in vision, difficulties with speech, N/T in extremities.   He is healthy otherwise with the exception of melanoma in 2020. Followed by Dr Shadad. Last had CT C/A/P in Sept 2020 which were normal.  He is not on any blood thinning medications. UTD on all routine health screening except colonoscopy.    Patient Active Problem List   Diagnosis Date Noted  . Melanoma of trunk (HCC) 04/06/2019    PMH: Past Medical History:  Diagnosis  Date  . Melanoma (HCC)    back  . Open wound    back    PSH: Past Surgical History:  Procedure Laterality Date  . MELANOMA EXCISION WITH SENTINEL LYMPH NODE BIOPSY N/A 04/01/2019   Procedure: WIDE LOCAL EXCISION OF BACK MELANOMA WITH RIGHT AXILLARY SENTINEL LYMPH NODE BIOPSY;  Surgeon: Byerly, Faera, MD;  Location: [facility] OR;  Service: General;  Laterality: N/A;  . SKIN SPLIT GRAFT N/A 04/10/2019   Procedure: SPLIT THICKNESS SKIN GRAFT FROM RIGHT  THIGH TO THE BACK;  Surgeon: Thimmappa, Brinda, MD;  Location: [facility] OR;  Service: Plastics;  Laterality: N/A;    (Not in a hospital admission)   SH: Social History   Tobacco Use  . Smoking status: Never Smoker  . Smokeless tobacco: Never Used  Vaping Use  . Vaping Use: Never used  Substance Use Topics  . Alcohol use: Never  . Drug use: Never    MEDS: Prior to Admission medications   Medication Sig Start Date End Date Taking? Authorizing Provider  acetaminophen (TYLENOL) 500 MG tablet Take 500 mg by mouth every 6 (six) hours as needed for headache.   Yes [provider]  dexamethasone (DECADRON) 4 MG tablet Take 1 tablet (4 mg total) by mouth 2 (two) times daily with a meal. 08/01/20  Yes Corey, Evan S, MD  Multiple Vitamin (MULTI-VITAMIN) tablet Take 1 tablet by mouth daily.   Yes [provider]    ALLERGY: No Known Allergies  Social History   Tobacco   Use  . Smoking status: Never Smoker  . Smokeless tobacco: Never Used  Substance Use Topics  . Alcohol use: Never     No family history on file.   ROS   Review of Systems  Constitutional: Negative for chills and fever.  HENT: Negative.   Eyes: Negative for blurred vision, double vision and photophobia.  Respiratory: Negative.   Cardiovascular: Negative.   Gastrointestinal: Negative for nausea and vomiting.  Genitourinary: Negative.   Musculoskeletal: Negative.   Skin: Negative.   Neurological: Positive for dizziness, weakness (left sided) and headaches.  Negative for tingling, tremors and seizures.    Exam   Vitals:   08/02/20 1854  BP: (!) 152/77  Pulse: 66  Resp: 16  Temp: 98.7 F (37.1 C)  SpO2: 97%   General appearance: WDWN, NAD Eyes: No scleral injection Cardiovascular: Regular rate and rhythm without murmurs, rubs, gallops. No edema or variciosities. Distal pulses normal. Pulmonary: Effort normal, non-labored breathing Musculoskeletal:     Muscle tone upper extremities: Normal    Muscle tone lower extremities: Normal    Motor exam: Upper Extremities Deltoid Bicep Tricep Grip  Right 5/5 5/5 5/5 5/5  Left 4/5 4/5 4/5 4/5   Lower Extremity IP Quad PF DF EHL  Right 5/5 5/5 5/5 5/5 5/5  Left 4-/5 4-/5 5/5 5/5 5/5   Neurological Mental Status:    - Patient is awake, alert, oriented to person, place, month, year, and situation    - Patient is able to give a clear and coherent history.    - No signs of aphasia or neglect Cranial Nerves    - II: Visual Fields are full. PERRL    - III/IV/VI: EOMI without ptosis or diploplia.     - V: Facial sensation is grossly normal    - VII: Facial movement is symmetric.     - VIII: hearing is intact to voice    - X: Uvula elevates symmetrically    - XI: Shoulder shrug is symmetric.    - XII: tongue is midline without atrophy or fasciculations.  Sensory: Sensation grossly intact to LT Mild drift Cerebellar FNF and HKS are intact bilaterally   Results - Imaging/Labs   Results for orders placed or performed during the hospital encounter of 08/02/20 (from the past 48 hour(s))  CBC with Differential     Status: Abnormal   Collection Time: 08/02/20  7:16 PM  Result Value Ref Range   WBC 10.1 4.0 - 10.5 K/uL   RBC 5.34 4.22 - 5.81 MIL/uL   Hemoglobin 14.4 13.0 - 17.0 g/dL   HCT 45.6 39 - 52 %   MCV 85.4 80.0 - 100.0 fL   MCH 27.0 26.0 - 34.0 pg   MCHC 31.6 30.0 - 36.0 g/dL   RDW 13.7 11.5 - 15.5 %   Platelets 259 150 - 400 K/uL   nRBC 0.0 0.0 - 0.2 %   Neutrophils Relative %  70 %   Neutro Abs 7.0 1.7 - 7.7 K/uL   Lymphocytes Relative 19 %   Lymphs Abs 2.0 0.7 - 4.0 K/uL   Monocytes Relative 10 %   Monocytes Absolute 1.1 (H) 0 - 1 K/uL   Eosinophils Relative 1 %   Eosinophils Absolute 0.1 0 - 0 K/uL   Basophils Relative 0 %   Basophils Absolute 0.0 0 - 0 K/uL   Immature Granulocytes 0 %   Abs Immature Granulocytes 0.04 0.00 - 0.07 K/uL    Comment: Performed at [facility]  [facility], [address] [place], [place] [address]  Basic metabolic panel     Status: Abnormal   Collection Time: 08/02/20  7:16 PM  Result Value Ref Range   Sodium 141 135 - 145 mmol/L   Potassium 3.9 3.5 - 5.1 mmol/L   Chloride 109 98 - 111 mmol/L   CO2 24 22 - 32 mmol/L   Glucose, Bld 107 (H) 70 - 99 mg/dL    Comment: Glucose reference range applies only to samples taken after fasting for at least 8 hours.   BUN 18 8 - 23 mg/dL   Creatinine, Ser 1.20 0.61 - 1.24 mg/dL   Calcium 9.0 8.9 - 10.3 mg/dL   GFR calc non Af Amer >60 >60 mL/min   GFR calc Af Amer >60 >60 mL/min   Anion gap 8 5 - 15    Comment: Performed at [facility], [address] [place],  [address]    CT HEAD WO CONTRAST  Result Date: 08/01/2020 CLINICAL DATA:  Lower extremity weakness and recent falls EXAM: CT HEAD WITHOUT CONTRAST TECHNIQUE: Contiguous axial images were obtained from the base of the skull through the vertex without intravenous contrast. COMPARISON:  None. FINDINGS: Brain: There is an apparent mass arising at the right frontal-temporal junction measuring 4.5 x 4.0 cm with extensive surrounding vasogenic edema. There is shift of the lateral ventricles from the midline by 1.1 cm. Third ventricle is also shifted to the left. Fourth ventricle is midline. There is no evident hemorrhage. There is no subdural or epidural fluid collection. No acute appearing infarct evident. Vascular: No hyperdense vessel. No appreciable vascular calcification. Skull: The bony calvarium appears intact.  Sinuses/Orbits: Mucosal thickening is noted in several ethmoid air cells. Other visualized paranasal sinuses are clear. Visualized orbits appear symmetric bilaterally. Other: Mastoid air cells are clear. There is debris in each external auditory canal, likely due to cerumen. IMPRESSION: 1. Mass at the junction of the periphery of the frontal and parietal lobes the left measuring 4.5 x 4.0 cm. Extensive surrounding vasogenic edema. 1.1 cm of midline shift the lateral and third ventricles to the left. Fourth ventricle midline. 2.  No hemorrhage or acute infarct. 3. Mucosal thickening in several ethmoid air cells. Probable cerumen in each external auditory canal. These results will be called to the ordering clinician or representative by the Radiologist Assistant, and communication documented in the PACS or [facility] Dashboard. Electronically Signed   By: William  Woodruff III M.D.   On: 08/01/2020 13:07   MR BRAIN W WO CONTRAST  Addendum Date: 08/02/2020   ADDENDUM REPORT: 08/02/2020 17:15 ADDENDUM: Study discussed by telephone with Dr. EVAN COREY on 08/02/2020 at 17:13. He also advises that the patient had a melanoma treated with resection last year, although subsequent CT Chest, Abdomen, and Pelvis were negative for metastatic disease at that time. Electronically Signed   By: H  Hall M.D.   On: 08/02/2020 17:15   Result Date: 08/02/2020 CLINICAL DATA:  64-year-old male with lower extremity weakness and evidence of right hemisphere mass with vasogenic edema on noncontrast head CT yesterday. EXAM: MRI HEAD WITHOUT AND WITH CONTRAST TECHNIQUE: Multiplanar, multiecho pulse sequences of the brain and surrounding structures were obtained without and with intravenous contrast. CONTRAST:  7.5mL GADAVIST GADOBUTROL 1 MMOL/ML IV SOLN COMPARISON:  Head CT 08/01/2020. FINDINGS: Brain: Large lobulated and enhancing mass in the right hemisphere measures up to 49 mm long axis (series 16, image 5) with extensive regional T2 and    FLAIR hyperintense vasogenic edema, corresponding to the head CT finding yesterday. No evidence of mineralization or blood products within the mass. It appears to be intra-axial, with no definite cortex displaced medial to the mass. And no other enhancing brain lesion. Intracranial mass effect with leftward midline shift up to 11 mm. Subtotal effacement of the right lateral ventricle and mild trapping of the left lateral ventricle with occipital horn transependymal edema (series 8 image 26). Basilar cisterns remain patent. No superimposed restricted diffusion to suggest acute infarction. No extra-axial collection or acute intracranial hemorrhage. Cervicomedullary junction and pituitary are within normal limits. Background cerebral gray and white matter signal appears normal. No encephalomalacia or chronic cerebral blood products identified. No definite dural thickening. Vascular: Major intracranial vascular flow voids are preserved. The major dural venous sinuses are enhancing and appear to be patent. Skull and upper cervical spine: Negative visible cervical spine and spinal cord. In general bone marrow signal is within normal limits. But there is a small T1 hypointense and enhancing lesion of the left parietal bone (6 mm series 14, image 126) with faint suspicious diffusion signal changes on series 4, image 61. No other suspicious marrow lesion is identified. Sinuses/Orbits: Negative. Other: Mastoids are clear. Visible internal auditory structures appear normal. IMPRESSION: 1. Large, large, 49 mm enhancing mass in the right hemisphere with extensive regional vasogenic edema, corresponding to the head CT finding yesterday. I favor the masses intra-axial, with top differential considerations of solitary Metastasis (see #3) versus High-grade primary tumor. An extra-axial Meningioma is felt less likely. 2. Intracranial mass effect with leftward midline shift up to 11 mm. Mild trapping of the left lateral ventricle with  transependymal edema. Recommend Neurosurgery consultation if not already done. 3. Small 6 mm enhancing lesion in the left parietal bone is indeterminate, but suspicious for a small skull metastasis if the lesion in #1 is determined to be metastatic. Electronically Signed: By: Genevie Ann M.D. On: 08/02/2020 16:59   Impression/Plan   64 y.o. male with nearly 5cm right fronto-temporal mass with significant associated vasogenic edema found during work up for left sided weakness, gait instability and memory difficulties. There is also a smaller left parietal bone lesion, ?skull met. Deficits noted above.   Patient will need to be admitted for further work up and management. Given size of brain mass, he will need to undergo craniotomy for resection and definitive diagnosis pending work up. I have discussed with with both the patient and his sister who state understanding and are in agreement. Tentatively planning for Friday pending OR availability.  - CT chest/abd/pelvis for met work up  - Increase decadron to 63m q 6 hours - Keppra 5026mBID for seizure prophylaxis - okay for diet from NS perspective  Please call for any concerns.   ViFerne ReusPA-C [facility] Sp[facility]

## 2020-08-03 ENCOUNTER — Encounter: Payer: Self-pay | Admitting: Family Medicine

## 2020-08-03 ENCOUNTER — Other Ambulatory Visit: Payer: Self-pay | Admitting: Neurosurgery

## 2020-08-03 ENCOUNTER — Inpatient Hospital Stay (HOSPITAL_COMMUNITY): Payer: Medicare Other

## 2020-08-03 DIAGNOSIS — R739 Hyperglycemia, unspecified: Secondary | ICD-10-CM

## 2020-08-03 DIAGNOSIS — R270 Ataxia, unspecified: Secondary | ICD-10-CM

## 2020-08-03 DIAGNOSIS — Z9889 Other specified postprocedural states: Secondary | ICD-10-CM

## 2020-08-03 DIAGNOSIS — Z85820 Personal history of malignant melanoma of skin: Secondary | ICD-10-CM

## 2020-08-03 LAB — BASIC METABOLIC PANEL
Anion gap: 6 (ref 5–15)
BUN: 15 mg/dL (ref 8–23)
CO2: 24 mmol/L (ref 22–32)
Calcium: 8.6 mg/dL — ABNORMAL LOW (ref 8.9–10.3)
Chloride: 109 mmol/L (ref 98–111)
Creatinine, Ser: 1.1 mg/dL (ref 0.61–1.24)
GFR calc Af Amer: >60 mL/min (ref >60)
GFR calc non Af Amer: >60 mL/min (ref >60)
Glucose, Bld: 150 mg/dL — ABNORMAL HIGH (ref 70–99)
Potassium: 4.3 mmol/L (ref 3.5–5.1)
Sodium: 139 mmol/L (ref 135–145)

## 2020-08-03 LAB — HIV ANTIBODY (ROUTINE TESTING W REFLEX): HIV Screen 4th Generation wRfx: NONREACTIVE

## 2020-08-03 LAB — CBC
HCT: 42.5 % (ref 39.0–52.0)
Hemoglobin: 13.9 g/dL (ref 13.0–17.0)
MCH: 27.8 pg (ref 26.0–34.0)
MCHC: 32.7 g/dL (ref 30.0–36.0)
MCV: 85 fL (ref 80.0–100.0)
Platelets: 261 10*3/uL (ref 150–400)
RBC: 5 MIL/uL (ref 4.22–5.81)
RDW: 13.7 % (ref 11.5–15.5)
WBC: 8.2 10*3/uL (ref 4.0–10.5)
nRBC: 0 % (ref 0.0–0.2)

## 2020-08-03 IMAGING — CT CT HEAD W/O CM
3 of 8 series · 16 of 47 positions shown, 19 images · non-contrast
Comparison: Head CT [DATE].  MRI yesterday.

CLINICAL DATA: Brain tumor.  [facility] protocol.

EXAM:
CT HEAD WITHOUT CONTRAST
TECHNIQUE: Contiguous axial images were obtained from the base of the skull
through the vertex without intravenous contrast.

[Series 5: head without cor · coronal · non-contrast · 0.34mm/px · 2 of 71 slices shown]
[im 24/71  brain]
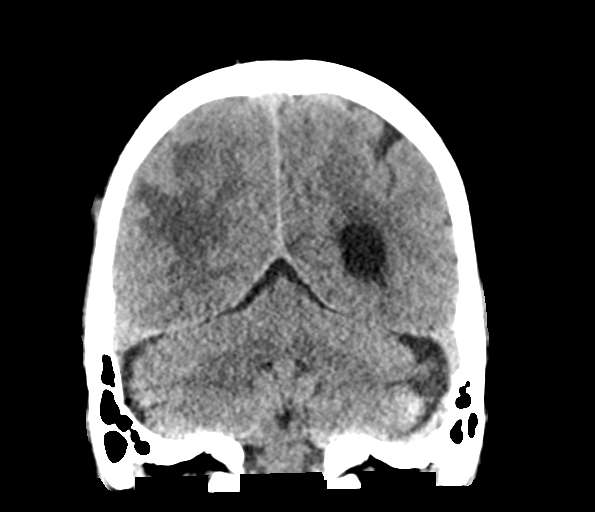
[im 47/71  brain]
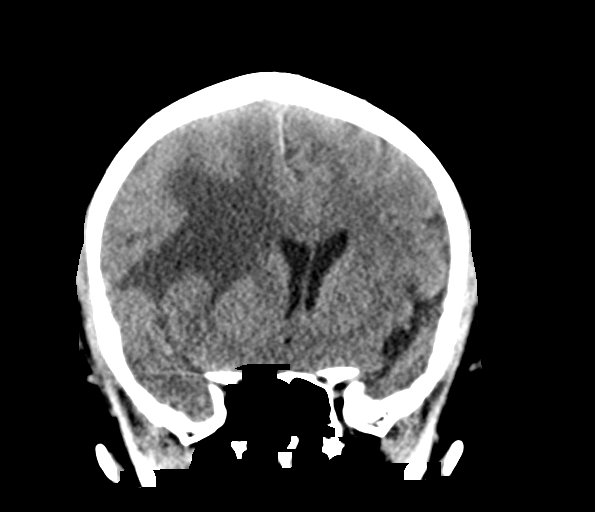

[Series 9: axial thins · axial · 0.45mm/px · z∈[-144,+26]mm · 12 of 196 slices shown, 15 images]
[im 13/196  brain]
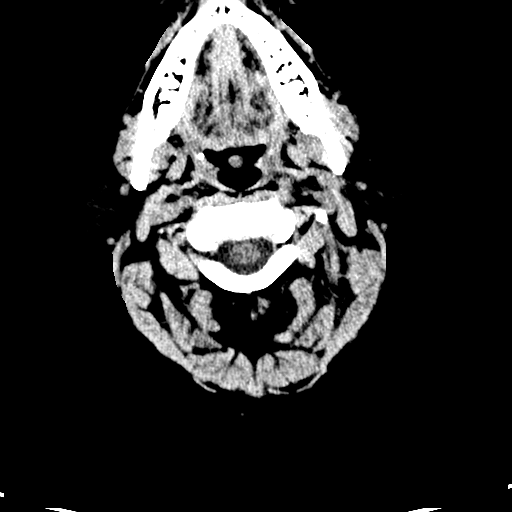
[im 13/196  bone]
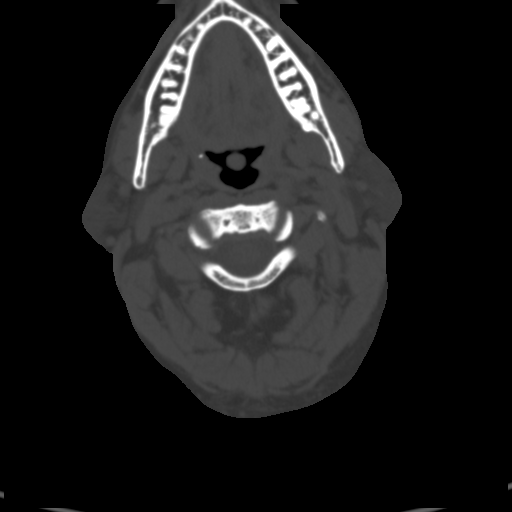
[im 25/196  brain]
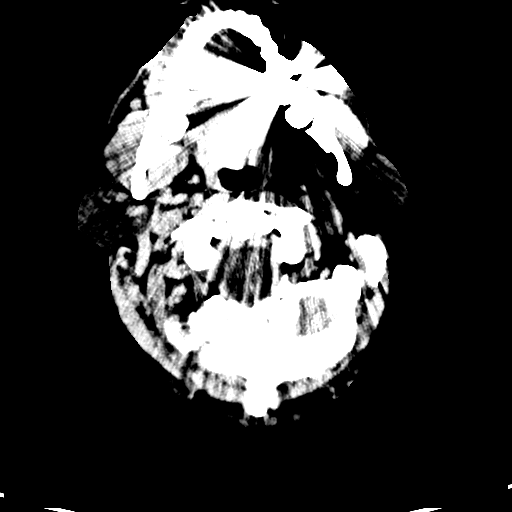
[im 49/196  brain]
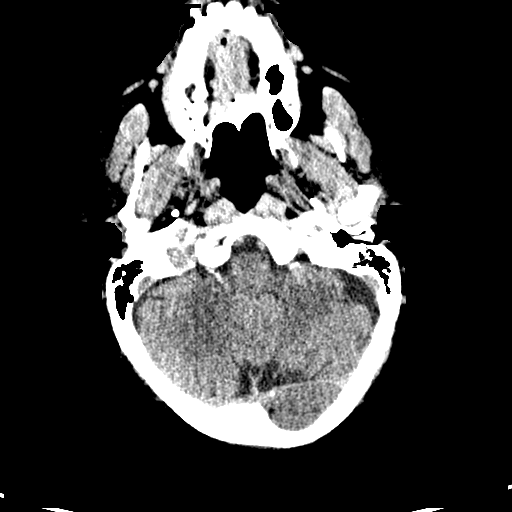
[im 61/196  brain]
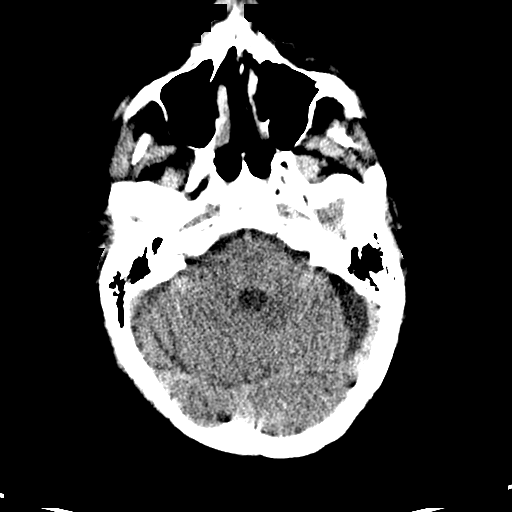
[im 74/196  brain]
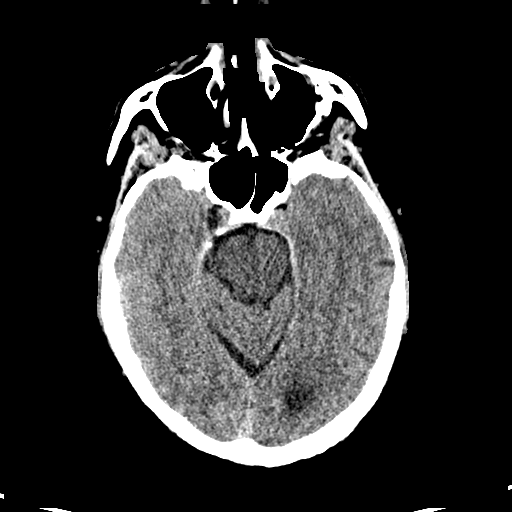
[im 74/196  bone]
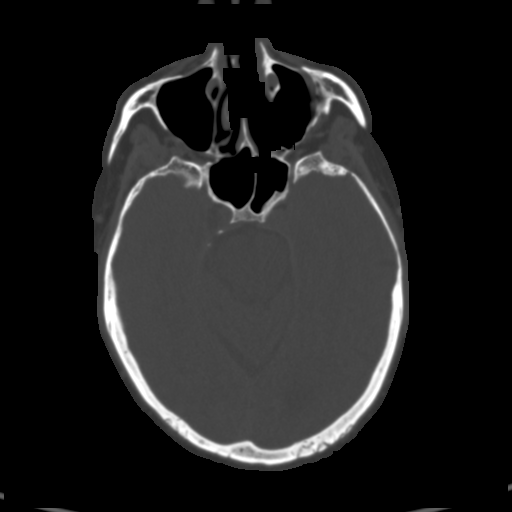
[im 86/196  brain]
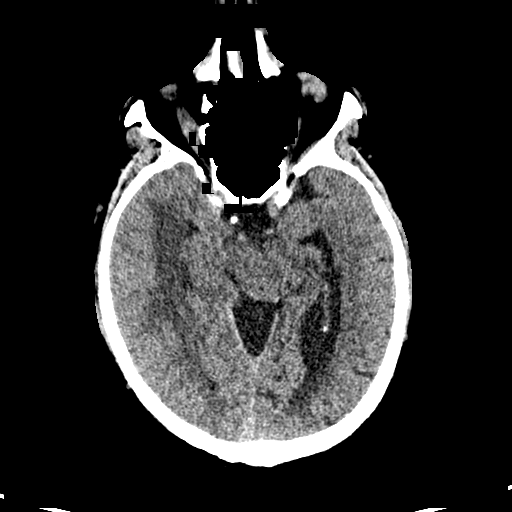
[im 110/196  brain]
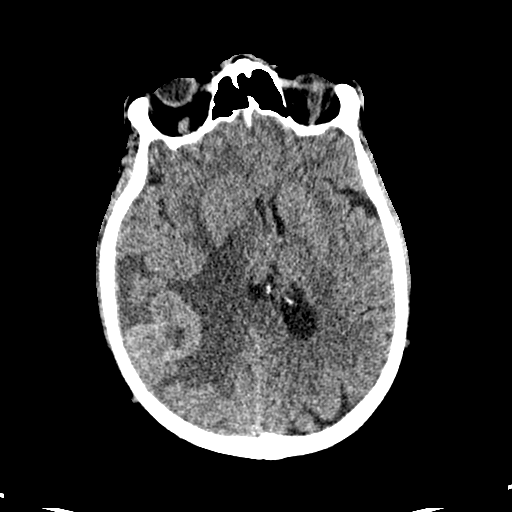
[im 122/196  brain]
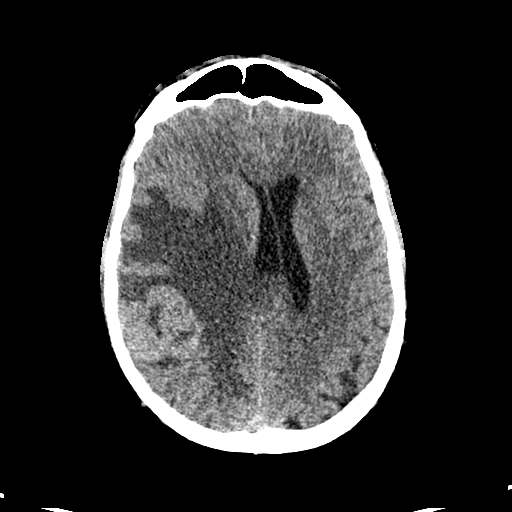
[im 135/196  brain]
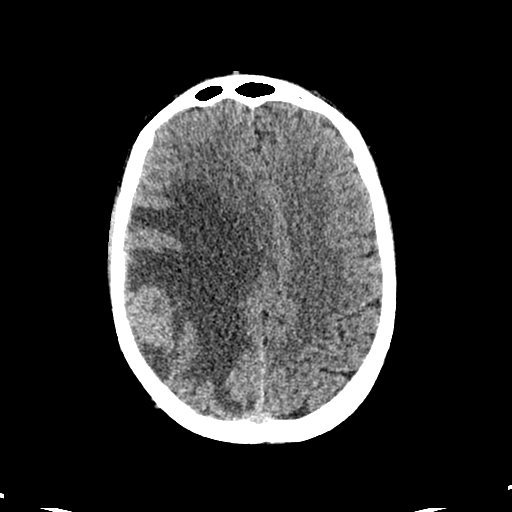
[im 135/196  bone]
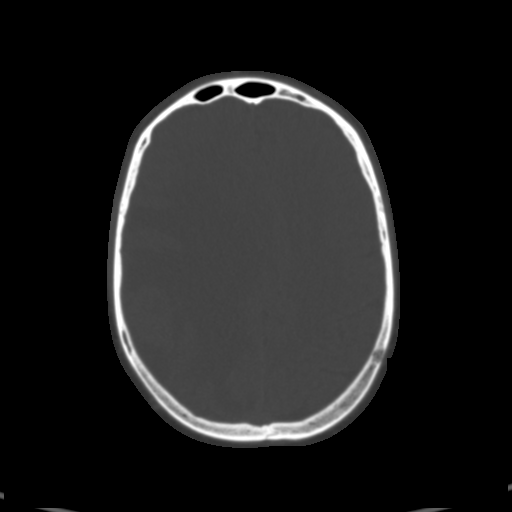
[im 147/196  brain]
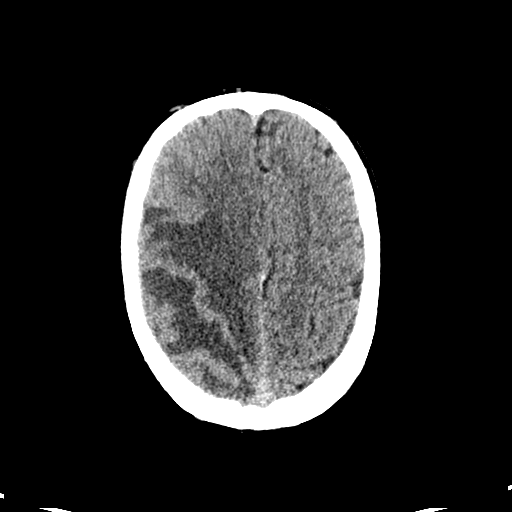
[im 171/196  brain]
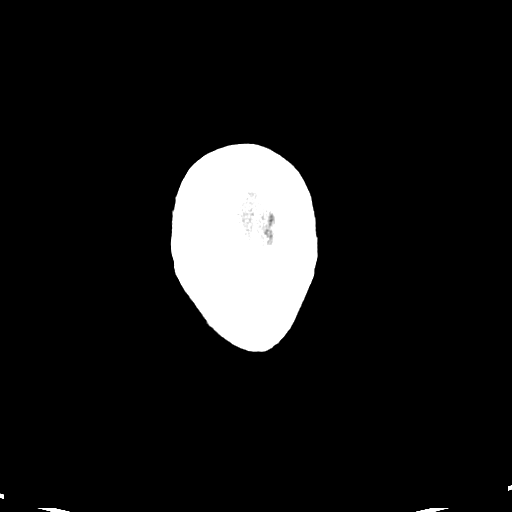
[im 183/196  brain]
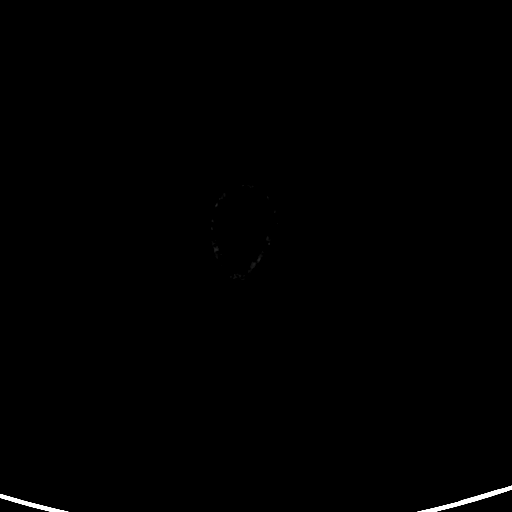

[Series 13: sagittal · sagittal · 0.41mm/px · 2 of 107 slices shown]
[im 36/107  brain]
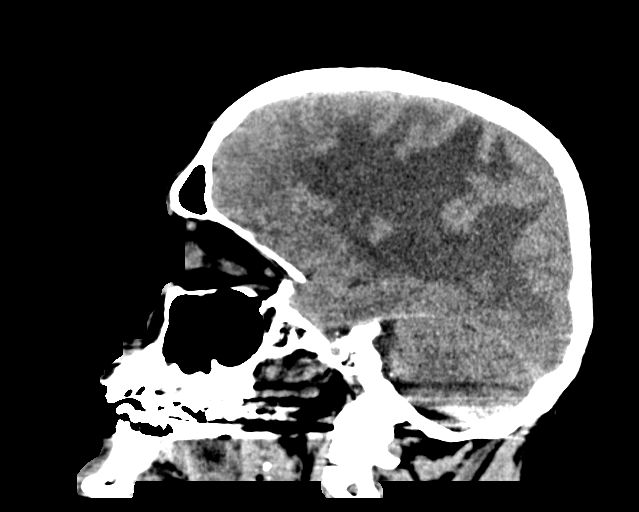
[im 71/107  brain]
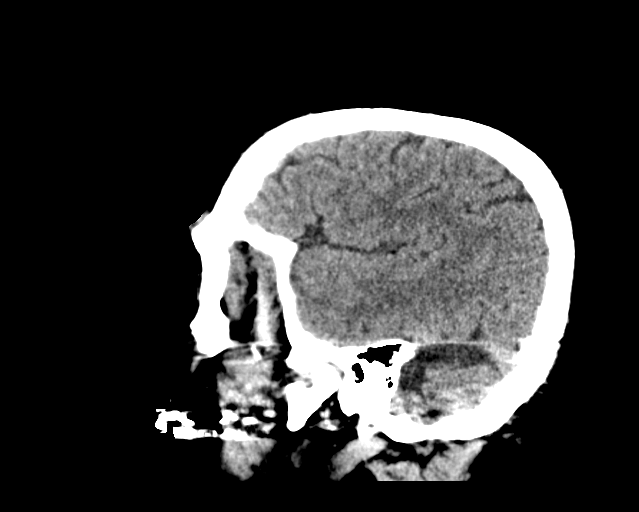

[16 of 47 positions shown; findings below may reference images not displayed]

FINDINGS: Brain: Brain[facility] protocol for operative guidance in localization.
Right peripheral parietal hyperdense mass measuring up to 4.9 cm in
diameter. Extensive regional vasogenic edema. Mass effect with
left-to-right midline shift of 12 mm. No change in ventricular size.

Vascular: No abnormal vascular finding.

Skull: Negative

Sinuses/Orbits: Clear/normal

Other: None
IMPRESSION: [facility] protocol for operative guidance. 4.9 cm right peripheral
parietal hyperdense mass with extensive regional vasogenic edema.
Mass effect with left-to-right midline shift of 12 mm.

## 2020-08-03 MED ORDER — OXYCODONE HCL 5 MG PO TABS: 5.0000 mg | ORAL_TABLET | Freq: Four times a day (QID) | ORAL | Status: DC | PRN

## 2020-08-03 MED ORDER — SENNOSIDES-DOCUSATE SODIUM 8.6-50 MG PO TABS
1.0000 | ORAL_TABLET | Freq: Every evening | ORAL | Status: DC | PRN
  Administered 2020-08-03 – 2020-08-04 (×2): 1 via ORAL
  Filled 2020-08-03 (×2): qty 1

## 2020-08-03 MED ORDER — ACETAMINOPHEN 325 MG PO TABS: 650.0000 mg | ORAL_TABLET | Freq: Four times a day (QID) | ORAL | Status: DC | PRN

## 2020-08-03 MED ORDER — PANTOPRAZOLE SODIUM 40 MG PO TBEC
40.0000 mg | DELAYED_RELEASE_TABLET | Freq: Every day | ORAL | Status: DC
  Administered 2020-08-03 – 2020-08-04 (×2): 40 mg via ORAL
  Filled 2020-08-03 (×2): qty 1

## 2020-08-03 MED ORDER — SODIUM CHLORIDE 0.9% FLUSH: 3.0000 mL | INTRAVENOUS | Status: DC | PRN

## 2020-08-03 MED ORDER — SODIUM CHLORIDE 0.9% FLUSH
3.0000 mL | Freq: Two times a day (BID) | INTRAVENOUS | Status: DC
  Administered 2020-08-03 – 2020-08-04 (×5): 3 mL via INTRAVENOUS

## 2020-08-03 MED ORDER — ONDANSETRON HCL 4 MG PO TABS: 4.0000 mg | ORAL_TABLET | Freq: Four times a day (QID) | ORAL | Status: DC | PRN

## 2020-08-03 MED ORDER — ACETAMINOPHEN 650 MG RE SUPP: 650.0000 mg | Freq: Four times a day (QID) | RECTAL | Status: DC | PRN

## 2020-08-03 MED ORDER — SODIUM CHLORIDE 0.9 % IV SOLN: 250.0000 mL | INTRAVENOUS | Status: DC | PRN

## 2020-08-03 MED ORDER — ONDANSETRON HCL 4 MG/2ML IJ SOLN: 4.0000 mg | Freq: Four times a day (QID) | INTRAMUSCULAR | Status: DC | PRN

## 2020-08-03 MED ORDER — ENOXAPARIN SODIUM 40 MG/0.4ML ~~LOC~~ SOLN
40.0000 mg | Freq: Every day | SUBCUTANEOUS | Status: AC
  Administered 2020-08-03 – 2020-08-04 (×2): 40 mg via SUBCUTANEOUS
  Filled 2020-08-03 (×2): qty 0.4

## 2020-08-03 NOTE — Progress Notes (Signed)
PROGRESS NOTE    SHAWNTA ZIMBELMAN  KWI:[id number] DOB: 20-Jan-1956 DOA: 08/02/2020 PCP: [name], MD   Brief Narrative:  HPI per Dr. Harrold Donath on 08/02/20  VERNOR MONNIG is a 64 y.o. male with no chronic medical problems that he does not take any chronic medications.  He was diagnosed with melanoma a year ago and had a melanoma surgically removed from his upper central back a year ago.  For the last 6 weeks he has been having difficulty with walking being off balance and a shuffling gait.  He has also had some weakness in his left leg.  A few days ago he was walking in the woods when he fell and was unable to get up and called his family to come and get him.  After that episode he did decided he would go for evaluation and saw his PCP and had a CT scan of the head which revealed a brain tumor.  He was then referred for an MRI which shows brain tumor with surrounding edema and some midline shift.  He was started on Decadron by his outpatient physician yesterday due to the edema around the mass in his brain on CT scan.  States he has not had any fever, chills, cough, chest pain, palpitations, urinary hesitancy, frequency or incontinence.  He has not had any abdominal pain, nausea, vomiting, diarrhea.  ED Course:   ER provider consulted neurosurgery who requested a CT of the chest abdomen and pelvis be performed and for medicine to admit patient for further work-up.  Neurosurgery stated they would prescribe and manage the steroids and will start patient on Keppra.  **Interim History He was started on steroids and Keppra.  Neurosurgery evaluating and tentatively planning for craniotomy resection on Friday.   Assessment & Plan:   Active Problems:   Neoplasm of brain causing mass effect on adjacent structures (HCC)  Neoplasm of brain causing mass effect on adjacent structures [facility]) -Patient was admitted to medical/surgical floor. -Neurosurgery was consulted in the emergency room and they  requested medicine admit patient. -CT of the Head showed "Mass at the junction of the periphery of the frontal and parietal lobes the left measuring 4.5 x 4.0 cm. Extensive surrounding vasogenic edema. 1.1 cm of midline shift the lateral and third ventricles to the left. Fourth ventricle midline. No hemorrhage or acute infarct. Mucosal thickening in several ethmoid air cells. Probable cerumen in each external auditory canal." -MRI Brain showed "Large, large, 49 mm enhancing mass in the right hemisphere with extensive regional vasogenic edema, corresponding to the head CT finding yesterday. I favor the masses intra-axial, with top differential considerations of solitary Metastasis (see #3) versus High-grade primary tumor. An extra-axial Meningioma is felt less likely. Intracranial mass effect with leftward midline shift up to 11 mm. Mild trapping of the left lateral ventricle with transependymal edema. Recommend Neurosurgery consultation if not already done. Small 6 mm enhancing lesion in the left parietal bone is indeterminate, but suspicious for a small skull metastasis if the lesion in #1 is determined to be metastatic." -Neurosurgery requested a CT of the chest abdomen and pelvis be performed to look for other lesions that may be present; CT Chest/Abd/Pelvis showed "No evidence of recurrent or metastatic disease within the chest, abdomen, or pelvis.  Aortic atherosclerosis." -Neurosurgery has ordered Decadron and Keppra; currently getting 4 mg IV every 6 schedule of the dexamethasone and he is currently on 500 mg p.o. twice daily of the levetiracetam -Continue with supportive care  and continue antiemetics with Zofran 4 mg p.o./IV every 6 as needed for nausea -Neurosurgery has ordered a CT of the head without contrast for staging and thin cuts -We will add PPI for the patient given concern for his high-dose steroids and for GI prophylaxis. -May Discuss with Neuro-Oncology Dr. [name]  -Neurosurgery  recommending craniotomy for resection currently scheduled for Friday.  Ataxia -Patient replaced on fall precautions and likely from above.   -Consulted PT/OT in the morning for evaluation.  Hyperglycemia -Patient's Blood Sugars ranging from 107-150 -Check HbA1c -Continue to Monitor Blood Sugars carefully and if necessary will need to place on Sensitive Novolog SSI   Hx of Melanoma -Had Excision with Right Sentinel Lymph Node Bx done by Dr. Stark Klein in 2020   DVT prophylaxis: Enoxaparin 40 mg sq Daily  Code Status: FULL CODE Family Communication: Spoke to wife at bedside Disposition Plan: Pending further Neurosurgical Intervention and Evaluation  Status is: Inpatient  Remains inpatient appropriate because:Unsafe d/c plan, IV treatments appropriate due to intensity of illness or inability to take PO and Inpatient level of care appropriate due to severity of illness   Dispo: The patient is from: Home              Anticipated d/c is to: TBD              Anticipated d/c date is: 3 days              Patient currently is not medically stable to d/c.  Consultants:   Neurosurgery Dr. Marcello Moores   Procedures: None  Antimicrobials:  Anti-infectives (From admission, onward)   None     Subjective: Seen and examined at bedside and states that he is feeling better after starting the steroids and states his head is not "throbbing".  No nausea or vomiting.  Feels okay.  Happy that his CT of the abdomen chest and pelvis did not show any metastatic disease.  No other concerns or complaints at this time.  Objective: Vitals:   08/03/20 0101 08/03/20 0319 08/03/20 0824 08/03/20 1138  BP: 133/69 133/70 115/80 130/75  Pulse: (!) 58 72 61 (!) 53  Resp: 15 18 16    Temp: 98 F (36.7 C) 98.2 F (36.8 C) (!) 97.5 F (36.4 C) 98.2 F (36.8 C)  TempSrc: Oral Oral Oral Oral  SpO2: 99% 98% 99% 98%  Weight:      Height:        Intake/Output Summary (Last 24 hours) at 08/03/2020 1336 Last  data filed at 08/03/2020 5284 Gross per 24 hour  Intake 120 ml  Output --  Net 120 ml   Filed Weights   08/02/20 1854  Weight: 77.6 kg   Examination: Physical Exam:  Constitutional: WN/WD mildly overweight Caucasian male currently in NAD and appears calm and comfortable Eyes: Lids and conjunctivae normal, sclerae anicteric  ENMT: External Ears, Nose appear normal. Grossly normal hearing.  Neck: Appears normal, supple, no cervical masses, normal ROM, no appreciable thyromegaly; no JVD Respiratory: Diminished to auscultation bilaterally, no wheezing, rales, rhonchi or crackles. Normal respiratory effort and patient is not tachypenic. No accessory muscle use.  Cardiovascular: RRR, no murmurs / rubs / gallops. S1 and S2 auscultated. No extremity edema.  Abdomen: Soft, non-tender, Slightly distended. Bowel sounds positive.  GU: Deferred. Musculoskeletal: No clubbing / cyanosis of digits/nails. No joint deformity upper and lower extremities.  Skin: No rashes, lesions, ulcers on a limited skin evaluation. No induration; Warm and dry.  Neurologic: CN  2-12 grossly intact with no focal deficits. Romberg sign and cerebellar reflexes not assessed.  Psychiatric: Normal judgment and insight. Alert and oriented x 3. Normal mood and appropriate affect.   Data Reviewed: I have personally reviewed following labs and imaging studies  CBC: Recent Labs  Lab 08/02/20 1916 08/03/20 0201  WBC 10.1 8.2  NEUTROABS 7.0  --   HGB 14.4 13.9  HCT 45.6 42.5  MCV 85.4 85.0  PLT 259 710   Basic Metabolic Panel: Recent Labs  Lab 08/02/20 1916 08/03/20 0201  NA 141 139  K 3.9 4.3  CL 109 109  CO2 24 24  GLUCOSE 107* 150*  BUN 18 15  CREATININE 1.20 1.10  CALCIUM 9.0 8.6*   GFR: Estimated Creatinine Clearance: 65 mL/min (by C-G formula based on SCr of 1.1 mg/dL). Liver Function Tests: No results for input(s): AST, ALT, ALKPHOS, BILITOT, PROT, ALBUMIN in the last 168 hours. No results for  input(s): LIPASE, AMYLASE in the last 168 hours. No results for input(s): AMMONIA in the last 168 hours. Coagulation Profile: No results for input(s): INR, PROTIME in the last 168 hours. Cardiac Enzymes: No results for input(s): CKTOTAL, CKMB, CKMBINDEX, TROPONINI in the last 168 hours. BNP (last 3 results) No results for input(s): PROBNP in the last 8760 hours. HbA1C: No results for input(s): HGBA1C in the last 72 hours. CBG: No results for input(s): GLUCAP in the last 168 hours. Lipid Profile: No results for input(s): CHOL, HDL, LDLCALC, TRIG, CHOLHDL, LDLDIRECT in the last 72 hours. Thyroid Function Tests: No results for input(s): TSH, T4TOTAL, FREET4, T3FREE, THYROIDAB in the last 72 hours. Anemia Panel: No results for input(s): VITAMINB12, FOLATE, FERRITIN, TIBC, IRON, RETICCTPCT in the last 72 hours. Sepsis Labs: No results for input(s): PROCALCITON, LATICACIDVEN in the last 168 hours.  Recent Results (from the past 240 hour(s))  SARS Coronavirus 2 by RT PCR (hospital order, performed in [facility] hospital lab) Nasopharyngeal Nasopharyngeal Swab     Status: None   Collection Time: 08/02/20  7:16 PM   Specimen: Nasopharyngeal Swab  Result Value Ref Range Status   SARS Coronavirus 2 NEGATIVE NEGATIVE Final    Comment: (NOTE) SARS-CoV-2 target nucleic acids are NOT DETECTED.  The SARS-CoV-2 RNA is generally detectable in upper and lower respiratory specimens during the acute phase of infection. The lowest concentration of SARS-CoV-2 viral copies this assay can detect is 250 copies / mL. A negative result does not preclude SARS-CoV-2 infection and should not be used as the sole basis for treatment or other patient management decisions.  A negative result may occur with improper specimen collection / handling, submission of specimen other than nasopharyngeal swab, presence of viral mutation(s) within the areas targeted by this assay, and inadequate number of viral  copies (<250 copies / mL). A negative result must be combined with clinical observations, patient history, and epidemiological information.  Fact Sheet for Patients:   StrictlyIdeas.no  Fact Sheet for Healthcare Providers: BankingDealers.co.za  This test is not yet approved or  cleared by the [place] FDA and has been authorized for detection and/or diagnosis of SARS-CoV-2 by FDA under an Emergency Use Authorization (EUA).  This EUA will remain in effect (meaning this test can be used) for the duration of the COVID-19 declaration under Section 564(b)(1) of the Act, 21 U.S.C. section 360bbb-3(b)(1), unless the authorization is terminated or revoked sooner.  Performed at [facility], [place] [address], [place], [place] [address]      RN  Pressure Injury Documentation:     Estimated body mass index is 25.62 kg/m as calculated from the following:   Height as of this encounter: 5' 8.5" (1.74 m).   Weight as of this encounter: 77.6 kg.  Malnutrition Type:      Malnutrition Characteristics:      Nutrition Interventions:           Radiology Studies: CT Chest W Contrast  Result Date: 08/02/2020 CLINICAL DATA:  Melanoma.  Surveillance examination. EXAM: CT CHEST, ABDOMEN, AND PELVIS WITH CONTRAST TECHNIQUE: Multidetector CT imaging of the chest, abdomen and pelvis was performed following the standard protocol during bolus administration of intravenous contrast. CONTRAST:  116mL OMNIPAQUE IOHEXOL 300 MG/ML  SOLN COMPARISON:  08/18/2019 FINDINGS: CT CHEST FINDINGS Cardiovascular: No significant vascular findings. Normal heart size. No pericardial effusion. Thoracic aorta is unremarkable. Mediastinum/Nodes: No enlarged mediastinal, hilar, or axillary lymph nodes. Surgical changes of right axillary lymph node dissection are identified. Thyroid gland, trachea, and esophagus demonstrate no significant findings. Lungs/Pleura:  Lungs are clear. No pleural effusion or pneumothorax. Musculoskeletal: Subcentimeter sclerotic focus within the right eighth rib just beyond the costovertebral junction is stable since prior examination and likely a benign bone island. No suspicious lytic or blastic bone lesions are seen within the thorax. CT ABDOMEN PELVIS FINDINGS Hepatobiliary: No focal liver abnormality is seen. No gallstones, gallbladder wall thickening, or biliary dilatation. Pancreas: Unremarkable Spleen: Unremarkable Adrenals/Urinary Tract: The adrenal glands are unremarkable. Simple cortical cysts again noted within the kidneys. The kidneys are otherwise unremarkable. The bladder is unremarkable. Stomach/Bowel: The stomach, small bowel, and large bowel are unremarkable. Appendix normal. No free intraperitoneal gas or fluid. Vascular/Lymphatic: Mild aorta atherosclerotic calcification noted. No aneurysm. No pathologic adenopathy within the abdomen and pelvis. Reproductive: Prostate is unremarkable. Other: Rectum unremarkable.  Tiny fat containing umbilical hernia. Musculoskeletal: Degenerative changes are seen within the lumbar spine. Vertebral hemangioma noted within the L2 vertebral body. No focal suspicious lytic or blastic bone lesions are seen. IMPRESSION: 1. No evidence of recurrent or metastatic disease within the chest, abdomen, or pelvis. 2. Aortic atherosclerosis. Aortic Atherosclerosis (ICD10-I70.0). Electronically Signed   By: Fidela Salisbury MD   On: 08/02/2020 21:27   MR BRAIN W WO CONTRAST  Addendum Date: 08/02/2020   ADDENDUM REPORT: 08/02/2020 17:15 ADDENDUM: Study discussed by telephone with Dr. Lynne Leader on 08/02/2020 at 17:13. He also advises that the patient had a melanoma treated with resection last year, although subsequent CT Chest, Abdomen, and Pelvis were negative for metastatic disease at that time. Electronically Signed   By: Genevie Ann M.D.   On: 08/02/2020 17:15   Result Date: 08/02/2020 CLINICAL DATA:   64 year old male with lower extremity weakness and evidence of right hemisphere mass with vasogenic edema on noncontrast head CT yesterday. EXAM: MRI HEAD WITHOUT AND WITH CONTRAST TECHNIQUE: Multiplanar, multiecho pulse sequences of the brain and surrounding structures were obtained without and with intravenous contrast. CONTRAST:  7.81mL GADAVIST GADOBUTROL 1 MMOL/ML IV SOLN COMPARISON:  Head CT 08/01/2020. FINDINGS: Brain: Large lobulated and enhancing mass in the right hemisphere measures up to 49 mm long axis (series 16, image 5) with extensive regional T2 and FLAIR hyperintense vasogenic edema, corresponding to the head CT finding yesterday. No evidence of mineralization or blood products within the mass. It appears to be intra-axial, with no definite cortex displaced medial to the mass. And no other enhancing brain lesion. Intracranial mass effect with leftward midline shift up to 11 mm. Subtotal effacement of the right  lateral ventricle and mild trapping of the left lateral ventricle with occipital horn transependymal edema (series 8 image 26). Basilar cisterns remain patent. No superimposed restricted diffusion to suggest acute infarction. No extra-axial collection or acute intracranial hemorrhage. Cervicomedullary junction and pituitary are within normal limits. Background cerebral gray and white matter signal appears normal. No encephalomalacia or chronic cerebral blood products identified. No definite dural thickening. Vascular: Major intracranial vascular flow voids are preserved. The major dural venous sinuses are enhancing and appear to be patent. Skull and upper cervical spine: Negative visible cervical spine and spinal cord. In general bone marrow signal is within normal limits. But there is a small T1 hypointense and enhancing lesion of the left parietal bone (6 mm series 14, image 126) with faint suspicious diffusion signal changes on series 4, image 61. No other suspicious marrow lesion is  identified. Sinuses/Orbits: Negative. Other: Mastoids are clear. Visible internal auditory structures appear normal. IMPRESSION: 1. Large, large, 49 mm enhancing mass in the right hemisphere with extensive regional vasogenic edema, corresponding to the head CT finding yesterday. I favor the masses intra-axial, with top differential considerations of solitary Metastasis (see #3) versus High-grade primary tumor. An extra-axial Meningioma is felt less likely. 2. Intracranial mass effect with leftward midline shift up to 11 mm. Mild trapping of the left lateral ventricle with transependymal edema. Recommend Neurosurgery consultation if not already done. 3. Small 6 mm enhancing lesion in the left parietal bone is indeterminate, but suspicious for a small skull metastasis if the lesion in #1 is determined to be metastatic. Electronically Signed: By: Genevie Ann M.D. On: 08/02/2020 16:59   CT ABDOMEN PELVIS W CONTRAST  Result Date: 08/02/2020 CLINICAL DATA:  Melanoma.  Surveillance examination. EXAM: CT CHEST, ABDOMEN, AND PELVIS WITH CONTRAST TECHNIQUE: Multidetector CT imaging of the chest, abdomen and pelvis was performed following the standard protocol during bolus administration of intravenous contrast. CONTRAST:  161mL OMNIPAQUE IOHEXOL 300 MG/ML  SOLN COMPARISON:  08/18/2019 FINDINGS: CT CHEST FINDINGS Cardiovascular: No significant vascular findings. Normal heart size. No pericardial effusion. Thoracic aorta is unremarkable. Mediastinum/Nodes: No enlarged mediastinal, hilar, or axillary lymph nodes. Surgical changes of right axillary lymph node dissection are identified. Thyroid gland, trachea, and esophagus demonstrate no significant findings. Lungs/Pleura: Lungs are clear. No pleural effusion or pneumothorax. Musculoskeletal: Subcentimeter sclerotic focus within the right eighth rib just beyond the costovertebral junction is stable since prior examination and likely a benign bone island. No suspicious lytic or  blastic bone lesions are seen within the thorax. CT ABDOMEN PELVIS FINDINGS Hepatobiliary: No focal liver abnormality is seen. No gallstones, gallbladder wall thickening, or biliary dilatation. Pancreas: Unremarkable Spleen: Unremarkable Adrenals/Urinary Tract: The adrenal glands are unremarkable. Simple cortical cysts again noted within the kidneys. The kidneys are otherwise unremarkable. The bladder is unremarkable. Stomach/Bowel: The stomach, small bowel, and large bowel are unremarkable. Appendix normal. No free intraperitoneal gas or fluid. Vascular/Lymphatic: Mild aorta atherosclerotic calcification noted. No aneurysm. No pathologic adenopathy within the abdomen and pelvis. Reproductive: Prostate is unremarkable. Other: Rectum unremarkable.  Tiny fat containing umbilical hernia. Musculoskeletal: Degenerative changes are seen within the lumbar spine. Vertebral hemangioma noted within the L2 vertebral body. No focal suspicious lytic or blastic bone lesions are seen. IMPRESSION: 1. No evidence of recurrent or metastatic disease within the chest, abdomen, or pelvis. 2. Aortic atherosclerosis. Aortic Atherosclerosis (ICD10-I70.0). Electronically Signed   By: Fidela Salisbury MD   On: 08/02/2020 21:27   Scheduled Meds: . dexamethasone (DECADRON) injection  4 mg Intravenous  Q6H  . enoxaparin (LOVENOX) injection  40 mg Subcutaneous Daily  . levETIRAcetam  500 mg Oral BID  . pantoprazole  40 mg Oral Daily  . sodium chloride flush  3 mL Intravenous Q12H   Continuous Infusions: . sodium chloride      LOS: 1 day   Kerney Elbe, DO [facility] PAGER is on AMION  If 7PM-7AM, please contact night-coverage [email or web address]

## 2020-08-03 NOTE — Progress Notes (Signed)
Neurosurgery  64 yo M with hx of local melanoma resection who has developed progressive apraxia and cognitive decline over the last 4 months.  He was found to have a large 4.5 cm right parietal lobe mass with significant associated vasogenic edema.  I discussed with the patient and wife recommendation for craniotomy for resection, tentatively planned for Friday.   Surgery would offer relief of mass effect as well as confirm diagnosis.  Risks, benefits, alternatives, and expected convalescence were discussed.  They wish to proceed with surgery.

## 2020-08-04 ENCOUNTER — Ambulatory Visit: Payer: Medicare Other | Admitting: Family Medicine

## 2020-08-04 DIAGNOSIS — D72829 Elevated white blood cell count, unspecified: Secondary | ICD-10-CM

## 2020-08-04 DIAGNOSIS — R270 Ataxia, unspecified: Secondary | ICD-10-CM

## 2020-08-04 DIAGNOSIS — R7303 Prediabetes: Secondary | ICD-10-CM

## 2020-08-04 DIAGNOSIS — R509 Fever, unspecified: Secondary | ICD-10-CM

## 2020-08-04 LAB — HEMOGLOBIN A1C
Hgb A1c MFr Bld: 6.1 % — ABNORMAL HIGH (ref 4.8–5.6)
Mean Plasma Glucose: 128.37 mg/dL

## 2020-08-04 LAB — COMPREHENSIVE METABOLIC PANEL
ALT: 20 U/L (ref 0–44)
AST: 17 U/L (ref 15–41)
Albumin: 3.5 g/dL (ref 3.5–5.0)
Alkaline Phosphatase: 40 U/L (ref 38–126)
Anion gap: 12 (ref 5–15)
BUN: 20 mg/dL (ref 8–23)
CO2: 22 mmol/L (ref 22–32)
Calcium: 8.9 mg/dL (ref 8.9–10.3)
Chloride: 106 mmol/L (ref 98–111)
Creatinine, Ser: 1.15 mg/dL (ref 0.61–1.24)
GFR calc Af Amer: >60 mL/min (ref >60)
GFR calc non Af Amer: >60 mL/min (ref >60)
Glucose, Bld: 131 mg/dL — ABNORMAL HIGH (ref 70–99)
Potassium: 4.3 mmol/L (ref 3.5–5.1)
Sodium: 140 mmol/L (ref 135–145)
Total Bilirubin: 0.9 mg/dL (ref 0.3–1.2)
Total Protein: 6.2 g/dL — ABNORMAL LOW (ref 6.5–8.1)

## 2020-08-04 LAB — CBC WITH DIFFERENTIAL/PLATELET
Abs Immature Granulocytes: 0.06 10*3/uL (ref 0.00–0.07)
Basophils Absolute: 0 10*3/uL (ref 0.0–0.1)
Basophils Relative: 0 %
Eosinophils Absolute: 0 10*3/uL (ref 0.0–0.5)
Eosinophils Relative: 0 %
HCT: 44.4 % (ref 39.0–52.0)
Hemoglobin: 14.6 g/dL (ref 13.0–17.0)
Immature Granulocytes: 0 %
Lymphocytes Relative: 8 %
Lymphs Abs: 1.1 10*3/uL (ref 0.7–4.0)
MCH: 27.9 pg (ref 26.0–34.0)
MCHC: 32.9 g/dL (ref 30.0–36.0)
MCV: 84.9 fL (ref 80.0–100.0)
Monocytes Absolute: 0.3 10*3/uL (ref 0.1–1.0)
Monocytes Relative: 2 %
Neutro Abs: 12.1 10*3/uL — ABNORMAL HIGH (ref 1.7–7.7)
Neutrophils Relative %: 90 %
Platelets: 273 10*3/uL (ref 150–400)
RBC: 5.23 MIL/uL (ref 4.22–5.81)
RDW: 13.8 % (ref 11.5–15.5)
WBC: 13.6 10*3/uL — ABNORMAL HIGH (ref 4.0–10.5)
nRBC: 0 % (ref 0.0–0.2)

## 2020-08-04 LAB — MAGNESIUM: Magnesium: 2.1 mg/dL (ref 1.7–2.4)

## 2020-08-04 LAB — GLUCOSE, CAPILLARY
Glucose-Capillary: 101 mg/dL — ABNORMAL HIGH (ref 70–99)
Glucose-Capillary: 132 mg/dL — ABNORMAL HIGH (ref 70–99)
Glucose-Capillary: 178 mg/dL — ABNORMAL HIGH (ref 70–99)

## 2020-08-04 LAB — PHOSPHORUS: Phosphorus: 3 mg/dL (ref 2.5–4.6)

## 2020-08-04 MED ORDER — INSULIN ASPART 100 UNIT/ML ~~LOC~~ SOLN
0.0000 [IU] | Freq: Three times a day (TID) | SUBCUTANEOUS | Status: DC
  Administered 2020-08-04: 1 [IU] via SUBCUTANEOUS
  Administered 2020-08-05: 2 [IU] via SUBCUTANEOUS
  Administered 2020-08-06 – 2020-08-07 (×4): 1 [IU] via SUBCUTANEOUS

## 2020-08-04 NOTE — Anesthesia Preprocedure Evaluation (Addendum)
Anesthesia Evaluation  Patient identified by MRN, date of birth, ID band Patient awake    Reviewed: Allergy & Precautions, NPO status , Patient's Chart, lab work & pertinent test results  Airway Mallampati: III  TM Distance: >3 FB Neck ROM: Full    Dental  (+) Chipped, Dental Advisory Given,    Pulmonary neg pulmonary ROS,    Pulmonary exam normal breath sounds clear to auscultation       Cardiovascular negative cardio ROS Normal cardiovascular exam Rhythm:Regular Rate:Normal     Neuro/Psych negative neurological ROS  negative psych ROS   GI/Hepatic negative GI ROS, Neg liver ROS,   Endo/Other  negative endocrine ROS  Renal/GU negative Renal ROS  negative genitourinary   Musculoskeletal negative musculoskeletal ROS (+)   Abdominal   Peds  Hematology negative hematology ROS (+)   Anesthesia Other Findings H/o melanoma now found to have a right parietal lobe mass with vasogenic edema and midline shift  Reproductive/Obstetrics                            Anesthesia Physical Anesthesia Plan  ASA: II  Anesthesia Plan: General   Post-op Pain Management:    Induction: Intravenous  PONV Risk Score and Plan: 3 and Midazolam, Dexamethasone and Ondansetron  Airway Management Planned: Oral ETT  Additional Equipment: Arterial line  Intra-op Plan:   Post-operative Plan: Extubation in OR  Informed Consent: I have reviewed the patients History and Physical, chart, labs and discussed the procedure including the risks, benefits and alternatives for the proposed anesthesia with the patient or authorized representative who has indicated his/her understanding and acceptance.     Dental advisory given  Plan Discussed with: CRNA  Anesthesia Plan Comments: (2 IVs)        Anesthesia Quick Evaluation

## 2020-08-04 NOTE — Evaluation (Signed)
Physical Therapy Evaluation Patient Details Name: Walter Rodriguez MRN: [id number] DOB: 01/01/1956 Today's Date: 08/04/2020   History of Present Illness  64 year old male who for the last 6 weeks he has been having difficulty with walking being off balance and a shuffling gait.  He has also had some weakness in his left leg.  A few days ago he was walking in the woods when he fell and was unable to get up and called his family to come and get him.  After that episode he did decided he would go for evaluation and saw his PCP and had a CT scan of the head which revealed a brain tumor.  He was then referred for an MRI which shows brain tumor with surrounding edema and some midline shift.  Clinical Impression  Patient received in bed, sister present at bedside. Patient reports he has been getting steroids which have helped with his symptoms. He performed bed mobility with modified independence, transfers with min guard. Ambulated without ad 300 feet. Initially demonstrates hesitant gait, decreased step length on left; decreased foot clearance on left.  Quality of gait improved with increased distance. He will continue to benefit from skilled PT while here to improve functional mobility, safety and independence.         Follow Up Recommendations Home health PT;Outpatient PT    Equipment Recommendations  None recommended by PT;Other (comment) (TBD)    Recommendations for Other Services       Precautions / Restrictions Precautions Precautions: Fall Restrictions Weight Bearing Restrictions: No      Mobility  Bed Mobility Overal bed mobility: Modified Independent             General bed mobility comments: increased time  Transfers Overall transfer level: Needs assistance Equipment used: None Transfers: Sit to/from Stand Sit to Stand: Min guard            Ambulation/Gait Ambulation/Gait assistance: Min guard Gait Distance (Feet): 300 Feet Assistive device: None Gait  Pattern/deviations: Step-through pattern;Shuffle;Decreased step length - left;Decreased stride length;Narrow base of support Gait velocity: decr   General Gait Details: steady with ambulation, no dizziness reported, short step length on left, decreased arm swing  Stairs            Wheelchair Mobility    Modified Rankin (Stroke Patients Only)       Balance Overall balance assessment: Mild deficits observed, not formally tested;History of Falls                                           Pertinent Vitals/Pain Pain Assessment: No/denies pain    Home Living Family/patient expects to be discharged to:: Private residence Living Arrangements: Alone Available Help at Discharge: Family;Available 24 hours/day Type of Home: House Home Access: Stairs to enter Entrance Stairs-Rails: None Entrance Stairs-Number of Steps: 2 Home Layout: Laundry or work area in basement;One level Home Equipment: Cane - single point;Shower seat Additional Comments: plans to stay with sister at dc --setup listed at home     Prior Function Level of Independence: Independent         Comments: driving, retired      Engineer, manufacturing Dominance   Dominant Hand: Right    Extremity/Trunk Assessment   Upper Extremity Assessment Upper Extremity Assessment: Defer to OT evaluation    Lower Extremity Assessment Lower Extremity Assessment: Overall WFL for tasks assessed  Cervical / Trunk Assessment Cervical / Trunk Assessment: Normal  Communication   Communication: No difficulties  Cognition Arousal/Alertness: Awake/alert Behavior During Therapy: WFL for tasks assessed/performed Overall Cognitive Status: Within Functional Limits for tasks assessed                                        General Comments      Exercises     Assessment/Plan    PT Assessment Patient needs continued PT services  PT Problem List Decreased strength;Decreased mobility;Decreased  balance;Decreased coordination       PT Treatment Interventions Therapeutic activities;Gait training;Therapeutic exercise;Stair training;Balance training;Functional mobility training;Neuromuscular re-education;Patient/family education    PT Goals (Current goals can be found in the Care Plan section)  Acute Rehab PT Goals Patient Stated Goal: to get back to walking and riding bike PT Goal Formulation: With patient/family Time For Goal Achievement: 08/18/20 Potential to Achieve Goals: Good    Frequency Min 3X/week   Barriers to discharge        Co-evaluation               AM-PAC PT "6 Clicks" Mobility  Outcome Measure Help needed turning from your back to your side while in a flat bed without using bedrails?: None Help needed moving from lying on your back to sitting on the side of a flat bed without using bedrails?: None Help needed moving to and from a bed to a chair (including a wheelchair)?: None Help needed standing up from a chair using your arms (e.g., wheelchair or bedside chair)?: None Help needed to walk in hospital room?: A Little Help needed climbing 3-5 steps with a railing? : A Little 6 Click Score: 22    End of Session Equipment Utilized During Treatment: Gait belt Activity Tolerance: Patient tolerated treatment well Patient left: in bed;with bed alarm set;with call bell/phone within reach;with family/visitor present;with SCD's reapplied Nurse Communication: Mobility status PT Visit Diagnosis: Other abnormalities of gait and mobility (R26.89);Muscle weakness (generalized) (M62.81)    Time: 9563-8756 PT Time Calculation (min) (ACUTE ONLY): 39 min   Charges:   PT Evaluation $PT Eval Moderate Complexity: 1 Mod PT Treatments $Gait Training: 8-22 mins        Kymari Lollis, PT, GCS 08/04/20,11:44 AM

## 2020-08-04 NOTE — Plan of Care (Signed)
Nutrition Education Note  RD consulted for nutrition education regarding a Heart Healthy, Consistent Carbohydrate diet.    Lab Results  Component Value Date   HGBA1C 6.1 (H) 08/04/2020  Lipid Panel  No results found for: CHOL, TRIG, HDL, CHOLHDL, VLDL, LDLCALC   RD provided "Heart Healthy, Consistent Carbohydrate Nutrition Therapy" handout from the Academy of [employer]etics. Reviewed patient's dietary recall. Provided examples on ways to decrease sodium and fat intake in diet while keeping blood sugar more controlled. Discouraged intake of processed foods and use of salt shaker. Encouraged fresh fruits and vegetables as well as whole grain sources of carbohydrates to maximize fiber intake.  Discussed different food groups and their effects on blood sugar, emphasizing carbohydrate-containing foods. Provided list of carbohydrates and recommended serving sizes of common foods. Discussed importance of controlled and consistent carbohydrate intake throughout the day. Provided examples of ways to balance meals/snacks and encouraged intake of high-fiber, whole grain complex carbohydrates. Teach back method used.  Expect fair compliance.  Current diet order is regular, patient is consuming approximately 75% of meals at this time and reports good appetite PTA.   Pt with no significant wt changes PTA. Wt Readings from Last 10 Encounters:  08/02/20 77.6 kg  07/27/20 77.7 kg  05/19/19 80.4 kg  04/10/19 78 kg  03/13/19 78 kg   Body mass index is 25.62 kg/m. Pt meets criteria for overweight based on current BMI.  Labs reviewed.  Medications: Decadron, Novolog, Protonix  No further nutrition interventions warranted at this time. RD contact information provided. If additional nutrition issues arise, please re-consult RD.   Larkin Ina, MS, RD, LDN RD pager number and weekend/on-call pager number located in [place].

## 2020-08-04 NOTE — Evaluation (Signed)
Occupational Therapy Evaluation Patient Details Name: Walter Rodriguez MRN: [id number] DOB: [birth year] Today's Date: 08/04/2020    History of Present Illness 64 year old male who for the last 6 weeks he has been having difficulty with walking being off balance and a shuffling gait.  He has also had some weakness in his left leg.  A few days ago he was walking in the woods when he fell and was unable to get up and called his family to come and get him.  After that episode he did decided he would go for evaluation and saw his PCP and had a CT scan of the head which revealed a brain tumor.  He was then referred for an MRI which shows brain tumor with surrounding edema and some midline shift.   Clinical Impression   PTA patient independent ADLs, mobility, reports several month decline in balance, cognition and 2 falls in the last month.  Admitted for above and limited by problem list below, including L sided weakness, impaired cognition, decreased activity tolerance.  Patient currently requires min guard to supervision for ADL engagement, min guard with transfers per PT (handoff to PT during session in room).  Will follow acutely to optimize independence and safety with ADLs, recommend further cognitive assessment next session. At this time recommend [place] services at dc, but pending surgery for tumor removal recommendations may change.     Follow Up Recommendations  Home health OT (may change pending surgery 8/27?)    Equipment Recommendations  None recommended by OT    Recommendations for Other Services       Precautions / Restrictions Precautions Precautions: Fall Restrictions Weight Bearing Restrictions: No      Mobility Bed Mobility Overal bed mobility: Modified Independent             General bed mobility comments: increased time  Transfers Overall transfer level: Needs assistance Equipment used: None Transfers: Sit to/from Stand Sit to Stand: Min guard         General  transfer comment: per PT min guard, handoff to PT in room    Balance Overall balance assessment: History of Falls;Mild deficits observed, not formally tested                                         ADL either performed or assessed with clinical judgement   ADL Overall ADL's : Needs assistance/impaired     Grooming: Set up;Sitting   Upper Body Bathing: Set up;Sitting   Lower Body Bathing: Min guard;Sitting/lateral leans   Upper Body Dressing : Supervision/safety;Set up;Sitting   Lower Body Dressing: Min guard;Sitting/lateral leans               Functional mobility during ADLs: Min guard;Supervision/safety General ADL Comments: pt limited by L sided weakness, decreased STM and attention      Vision Baseline Vision/History: Wears glasses Wears Glasses: At all times Patient Visual Report: No change from baseline Vision Assessment?: No apparent visual deficits     Perception     Praxis      Pertinent Vitals/Pain Pain Assessment: No/denies pain     Hand Dominance Right   Extremity/Trunk Assessment Upper Extremity Assessment Upper Extremity Assessment: LUE deficits/detail LUE Deficits / Details: grossly 3+/5 MMT, coordination and sensation WFL  LUE Sensation: WNL LUE Coordination: WNL   Lower Extremity Assessment Lower Extremity Assessment: Defer to PT evaluation  Cervical / Trunk Assessment Cervical / Trunk Assessment: Normal   Communication Communication Communication: No difficulties   Cognition Arousal/Alertness: Awake/alert Behavior During Therapy: WFL for tasks assessed/performed Overall Cognitive Status: Impaired/Different from baseline Area of Impairment: Memory;Attention                   Current Attention Level: Sustained Memory: Decreased short-term memory         General Comments: patient with decreased memory and attention to tasks, continue assessment    General Comments  sister present and supportive     Exercises     Shoulder Instructions      Home Living Family/patient expects to be discharged to:: Private residence Living Arrangements: Alone Available Help at Discharge: Family;Available 24 hours/day Type of Home: House Home Access: Stairs to enter [employer] of Steps: 2 Entrance Stairs-Rails: None Home Layout: Laundry or work area in basement;One level     Bathroom Shower/Tub: Teacher, early years/pre: Standard     Home Equipment: Cane - single point;Shower seat   Additional Comments: plans to stay with sister at dc --setup listed at home       Prior Functioning/Environment Level of Independence: Independent        Comments: driving, retired         OT Problem List: Decreased strength;Decreased activity tolerance;Impaired balance (sitting and/or standing);Decreased safety awareness;Decreased cognition;Decreased knowledge of use of DME or AE;Decreased knowledge of precautions      OT Treatment/Interventions: Self-care/ADL training;DME and/or AE instruction;Therapeutic activities;Patient/family education;Balance training;Neuromuscular education;Cognitive remediation/compensation    OT Goals(Current goals can be found in the care plan section) Acute Rehab OT Goals Patient Stated Goal: to get back to walking and riding bike OT Goal Formulation: With patient Time For Goal Achievement: 08/18/20 Potential to Achieve Goals: Good  OT Frequency: Min 2X/week   Barriers to D/C:            Co-evaluation PT/OT/SLP Co-Evaluation/Treatment: Yes     OT goals addressed during session: ADL's and self-care      AM-PAC OT "6 Clicks" Daily Activity     Outcome Measure Help from another person eating meals?: None Help from another person taking care of personal grooming?: A Little Help from another person toileting, which includes using toliet, bedpan, or urinal?: A Little Help from another person bathing (including washing, rinsing, drying)?: A  Little Help from another person to put on and taking off regular upper body clothing?: A Little Help from another person to put on and taking off regular lower body clothing?: A Little 6 Click Score: 19   End of Session Nurse Communication: Mobility status  Activity Tolerance: Patient tolerated treatment well Patient left: Other (comment) (with PT )  OT Visit Diagnosis: Other abnormalities of gait and mobility (R26.89);Other symptoms and signs involving the nervous system (R29.898);Other symptoms and signs involving cognitive function                Time: 7106-2694 OT Time Calculation (min): 15 min Charges:  OT General Charges $OT Visit: 1 Visit OT Evaluation $OT Eval Moderate Complexity: 1 Mod  Walter Rodriguez, OT Acute Rehabilitation Services Pager [phone] Office [phone]   Walter Rodriguez 08/04/2020, 12:22 PM

## 2020-08-04 NOTE — Progress Notes (Signed)
PROGRESS NOTE    Walter Rodriguez  NWG:[id number] DOB: 1956/06/26 DOA: 08/02/2020 PCP: Walter Stains, MD   Brief Narrative:  HPI per Dr. Harrold Rodriguez on 08/02/20  Walter Rodriguez is a 64 y.o. male with no chronic medical problems that he does not take any chronic medications.  He was diagnosed with melanoma a year ago and had a melanoma surgically removed from his upper central back a year ago.  For the last 6 weeks he has been having difficulty with walking being off balance and a shuffling gait.  He has also had some weakness in his left leg.  A few days ago he was walking in the woods when he fell and was unable to get up and called his family to come and get him.  After that episode he did decided he would go for evaluation and saw his PCP and had a CT scan of the head which revealed a brain tumor.  He was then referred for an MRI which shows brain tumor with surrounding edema and some midline shift.  He was started on Decadron by his outpatient physician yesterday due to the edema around the mass in his brain on CT scan.  States he has not had any fever, chills, cough, chest pain, palpitations, urinary hesitancy, frequency or incontinence.  He has not had any abdominal pain, nausea, vomiting, diarrhea.  ED Course:   ER provider consulted neurosurgery who requested a CT of the chest abdomen and pelvis be performed and for medicine to admit patient for further work-up.  Neurosurgery stated they would prescribe and manage the steroids and will start patient on Keppra.  **Interim History He was started on steroids and Keppra.  Neurosurgery evaluating and tentatively planning for craniotomy resection on Friday.  Overnight he had a temperature of 100.9 but he states that he was feeling hot and had a lot of covers and the temperature change in the room.  Fever was isolated he has had no further recurrence.  He does have elevated WBC but likely in the setting of steroid margination.  If he continues to  spike a temperature but we will panculture him and possibly start empiric antibiotics.   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 -Neurosurgery was consulted in the emergency room and they requested medicine admit patient. -CT of the Head showed "Mass at the junction of the periphery of the frontal and parietal lobes the left measuring 4.5 x 4.0 cm. Extensive surrounding vasogenic edema. 1.1 cm of midline shift the lateral and third ventricles to the left. Fourth ventricle midline. No hemorrhage or acute infarct. Mucosal thickening in several ethmoid air cells. Probable cerumen in each external auditory canal." -MRI Brain showed "Large, large, 49 mm enhancing mass in the right hemisphere with extensive regional vasogenic edema, corresponding to the head CT finding yesterday. I favor the masses intra-axial, with top differential considerations of solitary Metastasis (see #3) versus High-grade primary tumor. An extra-axial Meningioma is felt less likely. Intracranial mass effect with leftward midline shift up to 11 mm. Mild trapping of the left lateral ventricle with transependymal edema. Recommend Neurosurgery consultation if not already done. Small 6 mm enhancing lesion in the left parietal bone is indeterminate, but suspicious for a small skull metastasis if  the lesion in #1 is determined to be metastatic." -Neurosurgery requested a CT of the chest abdomen and pelvis be performed to look for other lesions that may be present; CT Chest/Abd/Pelvis showed "No evidence of recurrent or metastatic disease within the chest, abdomen, or pelvis.  Aortic atherosclerosis." -Neurosurgery has ordered Decadron and Keppra; currently getting 4 mg IV every 6 schedule of the dexamethasone  and he is currently on 500 mg p.o. twice daily of the levetiracetam -Continue with supportive care and continue antiemetics with Zofran 4 mg p.o./IV every 6 as needed for nausea -Neurosurgery has ordered a CT of the head without contrast for staging and thin cuts; Showed "BrainLAB protocol for operative guidance. 4.9 cm right peripheral parietal hyperdense mass with extensive regional vasogenic edema. Mass effect with left-to-right midline shift of 12 mm." -We will add PPI for the patient given concern for his high-dose steroids and for GI prophylaxis. -May Discuss with Neuro-Oncology Dr. Mickeal Rodriguez  -Neurosurgery recommending craniotomy for resection currently scheduled for Friday.  Ataxia -Patient replaced on fall precautions and likely from above.   -Consulted PT/OT in the morning for evaluation.  Hyperglycemia in the setting of Steroid Demargination -Patient's Blood Sugars ranging from 107-150 on daily BMP/CMP's; Blood sugar was 131 this AM  -Check HbA1c and was 6.1 -Continue to Monitor Blood Sugars carefully and will place on Sensitive Novolog SSI AC  Fever -Was isolated and had temperature of 100.9 yesterday -Patient had several blankets on him yesterday and Dr. Jeneen Rodriguez -Continue monitor for recurrence and if patient has another fever we will panculture him and obtain a urinalysis, urine culture, blood cultures x2 and repeat chest x-ray -Patient does have a leukocytosis but likely in the setting of Steroid Demargination -Continue monitor and trend and follow-up temperature curve and repeat CBC in a.m.  Leukocytosis -In the setting of steroid demargination -Patient's WBC went from 8.2 is now 13.6 -Continue to monitor for signs and symptoms of infection -continue monitor and trend repeat CBC in a.m.  Hx of Melanoma -Had Excision with Right Sentinel Lymph Node Bx done by Dr. Stark Rodriguez in 2020   DVT prophylaxis: Enoxaparin 40 mg sq Daily  Code Status: FULL CODE Family Communication:  Spoke to wife at bedside again  Disposition Plan: Pending further Neurosurgical Intervention and Evaluation; PT OT recommending home health PT   Status is: Inpatient  Remains inpatient appropriate because:Unsafe d/c plan, IV treatments appropriate due to intensity of illness or inability to take PO and Inpatient level of care appropriate due to severity of illness   Dispo: The patient is from: Home              Anticipated d/c is to: TBD              Anticipated d/c date is: 3 days              Patient currently is not medically stable to d/c.  Consultants:   Neurosurgery Dr. Marcello Moores   Procedures: None  Antimicrobials:  Anti-infectives (From admission, onward)   None     Subjective: Seen and examined at bedside and he got worried when he had to go for repeat head scan yesterday.  Patient had a temperature overnight but had none since then.  No chest pain, lightheadedness or dizziness.  No nausea or vomiting.  No burning or discomfort in his urine.  Was about to walk with physical therapy.  No other concerns or complaints at this time.  Objective: Vitals:   08/03/20  2311 08/04/20 0000 08/04/20 0401 08/04/20 0804  BP: 121/67 122/75 134/70 124/67  Pulse: (!) 50 (!) 45 (!) 54 (!) 54  Resp: 17 17 19 20   Temp: 98 F (36.7 C) 97.6 F (36.4 C) 98.6 F (37 C) 98.6 F (37 C)  TempSrc: Oral Oral Oral Oral  SpO2: 99% 98% 98% 99%  Weight:      Height:        Intake/Output Summary (Last 24 hours) at 08/04/2020 1200 Last data filed at 08/04/2020 1024 Gross per 24 hour  Intake 240 ml  Output --  Net 240 ml   Filed Weights   08/02/20 1854  Weight: 77.6 kg   Examination: Physical Exam:  Constitutional: WN/WD mildly overweight Caucasian male currently no acute distress appears mildly anxious Eyes: Lids and conjunctivae normal, sclerae anicteric  ENMT: External Ears, Nose appear normal. Grossly normal hearing.  Neck: Appears normal, supple, no cervical masses, normal ROM, no  appreciable thyromegaly; no JVD Respiratory: Diminished to auscultation bilaterally, no wheezing, rales, rhonchi or crackles. Normal respiratory effort and patient is not tachypenic. No accessory muscle use. Unlabored breathing  Cardiovascular: RRR, no murmurs / rubs / gallops. S1 and S2 auscultated. No extremity edema.  Abdomen: Soft, non-tender, mildly-distended. Bowel sounds positive.  GU: Deferred. Musculoskeletal: No clubbing / cyanosis of digits/nails. No joint deformity upper and lower extremities. Skin: No rashes, lesions, ulcers on a limited skin evaluation. No induration; Warm and dry.  Neurologic: CN 2-12 grossly intact with no focal deficits. Romberg sign and cerebellar reflexes not assessed.  Psychiatric: Normal judgment and insight. Alert and oriented x 3. Slightly anxious mood and appropriate affect.   Data Reviewed: I have personally reviewed following labs and imaging studies  CBC: Recent Labs  Lab 08/02/20 1916 08/03/20 0201 08/04/20 0641  WBC 10.1 8.2 13.6*  NEUTROABS 7.0  --  12.1*  HGB 14.4 13.9 14.6  HCT 45.6 42.5 44.4  MCV 85.4 85.0 84.9  PLT 259 261 939   Basic Metabolic Panel: Recent Labs  Lab 08/02/20 1916 08/03/20 0201 08/04/20 0641  NA 141 139 140  K 3.9 4.3 4.3  CL 109 109 106  CO2 24 24 22   GLUCOSE 107* 150* 131*  BUN 18 15 20   CREATININE 1.20 1.10 1.15  CALCIUM 9.0 8.6* 8.9  MG  --   --  2.1  PHOS  --   --  3.0   GFR: Estimated Creatinine Clearance: 62.2 mL/min (by C-G formula based on SCr of 1.15 mg/dL). Liver Function Tests: Recent Labs  Lab 08/04/20 0641  AST 17  ALT 20  ALKPHOS 40  BILITOT 0.9  PROT 6.2*  ALBUMIN 3.5   No results for input(s): LIPASE, AMYLASE in the last 168 hours. No results for input(s): AMMONIA in the last 168 hours. Coagulation Profile: No results for input(s): INR, PROTIME in the last 168 hours. Cardiac Enzymes: No results for input(s): CKTOTAL, CKMB, CKMBINDEX, TROPONINI in the last 168 hours. BNP  (last 3 results) No results for input(s): PROBNP in the last 8760 hours. HbA1C: Recent Labs    08/04/20 0641  HGBA1C 6.1*   CBG: No results for input(s): GLUCAP in the last 168 hours. Lipid Profile: No results for input(s): CHOL, HDL, LDLCALC, TRIG, CHOLHDL, LDLDIRECT in the last 72 hours. Thyroid Function Tests: No results for input(s): TSH, T4TOTAL, FREET4, T3FREE, THYROIDAB in the last 72 hours. Anemia Panel: No results for input(s): VITAMINB12, FOLATE, FERRITIN, TIBC, IRON, RETICCTPCT in the last 72 hours. Sepsis Labs: No  results for input(s): PROCALCITON, LATICACIDVEN in the last 168 hours.  Recent Results (from the past 240 hour(s))  SARS Coronavirus 2 by RT PCR (hospital order, performed in [facility] hospital lab) Nasopharyngeal Nasopharyngeal Swab     Status: None   Collection Time: 08/02/20  7:16 PM   Specimen: Nasopharyngeal Swab  Result Value Ref Range Status   SARS Coronavirus 2 NEGATIVE NEGATIVE Final    Comment: (NOTE) SARS-CoV-2 target nucleic acids are NOT DETECTED.  The SARS-CoV-2 RNA is generally detectable in upper and lower respiratory specimens during the acute phase of infection. The lowest concentration of SARS-CoV-2 viral copies this assay can detect is 250 copies / mL. A negative result does not preclude SARS-CoV-2 infection and should not be used as the sole basis for treatment or other patient management decisions.  A negative result may occur with improper specimen collection / handling, submission of specimen other than nasopharyngeal swab, presence of viral mutation(s) within the areas targeted by this assay, and inadequate number of viral copies (<250 copies / mL). A negative result must be combined with clinical observations, patient history, and epidemiological information.  Fact Sheet for Patients:   StrictlyIdeas.no  Fact Sheet for Healthcare Providers: BankingDealers.co.za  This test is  not yet approved or  cleared by the [place] FDA and has been authorized for detection and/or diagnosis of SARS-CoV-2 by FDA under an Emergency Use Authorization (EUA).  This EUA will remain in effect (meaning this test can be used) for the duration of the COVID-19 declaration under Section 564(b)(1) of the Act, 21 U.S.C. section 360bbb-3(b)(1), unless the authorization is terminated or revoked sooner.  Performed at [facility], [place] [address] [place], [place] [address]      RN Pressure Injury Documentation:     Estimated body mass index is 25.62 kg/m as calculated from the following:   Height as of this encounter: 5' 8.5" (1.74 m).   Weight as of this encounter: 77.6 kg.  Malnutrition Type:      Malnutrition Characteristics:      Nutrition Interventions:           Radiology Studies: CT HEAD WO CONTRAST  Result Date: 08/03/2020 CLINICAL DATA:  Brain tumor.  BrainLAB protocol. EXAM: CT HEAD WITHOUT CONTRAST TECHNIQUE: Contiguous axial images were obtained from the base of the skull through the vertex without intravenous contrast. COMPARISON:  Head CT 08/01/2020.  MRI yesterday. FINDINGS: Brain: BrainLAB protocol for operative guidance in localization. Right peripheral parietal hyperdense mass measuring up to 4.9 cm in diameter. Extensive regional vasogenic edema. Mass effect with left-to-right midline shift of 12 mm. No change in ventricular size. Vascular: No abnormal vascular finding. Skull: Negative Sinuses/Orbits: Clear/normal Other: None IMPRESSION: BrainLAB protocol for operative guidance. 4.9 cm right peripheral parietal hyperdense mass with extensive regional vasogenic edema. Mass effect with left-to-right midline shift of 12 mm. Electronically Signed   By: Nelson Chimes M.D.   On: 08/03/2020 19:29   CT Chest W Contrast  Result Date: 08/02/2020 CLINICAL DATA:  Melanoma.  Surveillance examination. EXAM: CT CHEST, ABDOMEN, AND PELVIS WITH CONTRAST  TECHNIQUE: Multidetector CT imaging of the chest, abdomen and pelvis was performed following the standard protocol during bolus administration of intravenous contrast. CONTRAST:  166mL OMNIPAQUE IOHEXOL 300 MG/ML  SOLN COMPARISON:  08/18/2019 FINDINGS: CT CHEST FINDINGS Cardiovascular: No significant vascular findings. Normal heart size. No pericardial effusion. Thoracic aorta is unremarkable. Mediastinum/Nodes: No enlarged mediastinal, hilar, or axillary lymph nodes. Surgical changes of right axillary  lymph node dissection are identified. Thyroid gland, trachea, and esophagus demonstrate no significant findings. Lungs/Pleura: Lungs are clear. No pleural effusion or pneumothorax. Musculoskeletal: Subcentimeter sclerotic focus within the right eighth rib just beyond the costovertebral junction is stable since prior examination and likely a benign bone island. No suspicious lytic or blastic bone lesions are seen within the thorax. CT ABDOMEN PELVIS FINDINGS Hepatobiliary: No focal liver abnormality is seen. No gallstones, gallbladder wall thickening, or biliary dilatation. Pancreas: Unremarkable Spleen: Unremarkable Adrenals/Urinary Tract: The adrenal glands are unremarkable. Simple cortical cysts again noted within the kidneys. The kidneys are otherwise unremarkable. The bladder is unremarkable. Stomach/Bowel: The stomach, small bowel, and large bowel are unremarkable. Appendix normal. No free intraperitoneal gas or fluid. Vascular/Lymphatic: Mild aorta atherosclerotic calcification noted. No aneurysm. No pathologic adenopathy within the abdomen and pelvis. Reproductive: Prostate is unremarkable. Other: Rectum unremarkable.  Tiny fat containing umbilical hernia. Musculoskeletal: Degenerative changes are seen within the lumbar spine. Vertebral hemangioma noted within the L2 vertebral body. No focal suspicious lytic or blastic bone lesions are seen. IMPRESSION: 1. No evidence of recurrent or metastatic disease within  the chest, abdomen, or pelvis. 2. Aortic atherosclerosis. Aortic Atherosclerosis (ICD10-I70.0). Electronically Signed   By: Fidela Salisbury MD   On: 08/02/2020 21:27   MR BRAIN W WO CONTRAST  Addendum Date: 08/02/2020   ADDENDUM REPORT: 08/02/2020 17:15 ADDENDUM: Study discussed by telephone with Dr. Lynne Leader on 08/02/2020 at 17:13. He also advises that the patient had a melanoma treated with resection last year, although subsequent CT Chest, Abdomen, and Pelvis were negative for metastatic disease at that time. Electronically Signed   By: Genevie Ann M.D.   On: 08/02/2020 17:15   Result Date: 08/02/2020 CLINICAL DATA:  64 year old male with lower extremity weakness and evidence of right hemisphere mass with vasogenic edema on noncontrast head CT yesterday. EXAM: MRI HEAD WITHOUT AND WITH CONTRAST TECHNIQUE: Multiplanar, multiecho pulse sequences of the brain and surrounding structures were obtained without and with intravenous contrast. CONTRAST:  7.41mL GADAVIST GADOBUTROL 1 MMOL/ML IV SOLN COMPARISON:  Head CT 08/01/2020. FINDINGS: Brain: Large lobulated and enhancing mass in the right hemisphere measures up to 49 mm long axis (series 16, image 5) with extensive regional T2 and FLAIR hyperintense vasogenic edema, corresponding to the head CT finding yesterday. No evidence of mineralization or blood products within the mass. It appears to be intra-axial, with no definite cortex displaced medial to the mass. And no other enhancing brain lesion. Intracranial mass effect with leftward midline shift up to 11 mm. Subtotal effacement of the right lateral ventricle and mild trapping of the left lateral ventricle with occipital horn transependymal edema (series 8 image 26). Basilar cisterns remain patent. No superimposed restricted diffusion to suggest acute infarction. No extra-axial collection or acute intracranial hemorrhage. Cervicomedullary junction and pituitary are within normal limits. Background cerebral gray  and white matter signal appears normal. No encephalomalacia or chronic cerebral blood products identified. No definite dural thickening. Vascular: Major intracranial vascular flow voids are preserved. The major dural venous sinuses are enhancing and appear to be patent. Skull and upper cervical spine: Negative visible cervical spine and spinal cord. In general bone marrow signal is within normal limits. But there is a small T1 hypointense and enhancing lesion of the left parietal bone (6 mm series 14, image 126) with faint suspicious diffusion signal changes on series 4, image 61. No other suspicious marrow lesion is identified. Sinuses/Orbits: Negative. Other: Mastoids are clear. Visible internal auditory structures appear  normal. IMPRESSION: 1. Large, large, 49 mm enhancing mass in the right hemisphere with extensive regional vasogenic edema, corresponding to the head CT finding yesterday. I favor the masses intra-axial, with top differential considerations of solitary Metastasis (see #3) versus High-grade primary tumor. An extra-axial Meningioma is felt less likely. 2. Intracranial mass effect with leftward midline shift up to 11 mm. Mild trapping of the left lateral ventricle with transependymal edema. Recommend Neurosurgery consultation if not already done. 3. Small 6 mm enhancing lesion in the left parietal bone is indeterminate, but suspicious for a small skull metastasis if the lesion in #1 is determined to be metastatic. Electronically Signed: By: Genevie Ann M.D. On: 08/02/2020 16:59   CT ABDOMEN PELVIS W CONTRAST  Result Date: 08/02/2020 CLINICAL DATA:  Melanoma.  Surveillance examination. EXAM: CT CHEST, ABDOMEN, AND PELVIS WITH CONTRAST TECHNIQUE: Multidetector CT imaging of the chest, abdomen and pelvis was performed following the standard protocol during bolus administration of intravenous contrast. CONTRAST:  136mL OMNIPAQUE IOHEXOL 300 MG/ML  SOLN COMPARISON:  08/18/2019 FINDINGS: CT CHEST FINDINGS  Cardiovascular: No significant vascular findings. Normal heart size. No pericardial effusion. Thoracic aorta is unremarkable. Mediastinum/Nodes: No enlarged mediastinal, hilar, or axillary lymph nodes. Surgical changes of right axillary lymph node dissection are identified. Thyroid gland, trachea, and esophagus demonstrate no significant findings. Lungs/Pleura: Lungs are clear. No pleural effusion or pneumothorax. Musculoskeletal: Subcentimeter sclerotic focus within the right eighth rib just beyond the costovertebral junction is stable since prior examination and likely a benign bone island. No suspicious lytic or blastic bone lesions are seen within the thorax. CT ABDOMEN PELVIS FINDINGS Hepatobiliary: No focal liver abnormality is seen. No gallstones, gallbladder wall thickening, or biliary dilatation. Pancreas: Unremarkable Spleen: Unremarkable Adrenals/Urinary Tract: The adrenal glands are unremarkable. Simple cortical cysts again noted within the kidneys. The kidneys are otherwise unremarkable. The bladder is unremarkable. Stomach/Bowel: The stomach, small bowel, and large bowel are unremarkable. Appendix normal. No free intraperitoneal gas or fluid. Vascular/Lymphatic: Mild aorta atherosclerotic calcification noted. No aneurysm. No pathologic adenopathy within the abdomen and pelvis. Reproductive: Prostate is unremarkable. Other: Rectum unremarkable.  Tiny fat containing umbilical hernia. Musculoskeletal: Degenerative changes are seen within the lumbar spine. Vertebral hemangioma noted within the L2 vertebral body. No focal suspicious lytic or blastic bone lesions are seen. IMPRESSION: 1. No evidence of recurrent or metastatic disease within the chest, abdomen, or pelvis. 2. Aortic atherosclerosis. Aortic Atherosclerosis (ICD10-I70.0). Electronically Signed   By: Fidela Salisbury MD   On: 08/02/2020 21:27   Scheduled Meds: . dexamethasone (DECADRON) injection  4 mg Intravenous Q6H  . insulin aspart  0-9  Units Subcutaneous TID WC  . levETIRAcetam  500 mg Oral BID  . pantoprazole  40 mg Oral Daily  . sodium chloride flush  3 mL Intravenous Q12H   Continuous Infusions: . sodium chloride      LOS: 2 days   Kerney Elbe, DO [facility] PAGER is on AMION  If 7PM-7AM, please contact night-coverage [email or web address]

## 2020-08-05 ENCOUNTER — Inpatient Hospital Stay (HOSPITAL_COMMUNITY): Payer: Medicare Other | Admitting: Anesthesiology

## 2020-08-05 ENCOUNTER — Encounter (HOSPITAL_COMMUNITY)
Admission: EM | Disposition: A | Discharge status: Home Health | Payer: Self-pay | Source: Home / Self Care | Attending: Internal Medicine

## 2020-08-05 ENCOUNTER — Encounter (HOSPITAL_COMMUNITY): Payer: Self-pay | Admitting: Family Medicine

## 2020-08-05 ENCOUNTER — Other Ambulatory Visit: Payer: Self-pay | Admitting: Radiation Therapy

## 2020-08-05 ENCOUNTER — Ambulatory Visit: Payer: Medicare Other | Admitting: Rehabilitative and Restorative Service Providers"

## 2020-08-05 ENCOUNTER — Other Ambulatory Visit: Payer: Self-pay

## 2020-08-05 HISTORY — PX: APPLICATION OF CRANIAL NAVIGATION: SHX6578

## 2020-08-05 HISTORY — PX: CRANIOTOMY: SHX93

## 2020-08-05 LAB — ABO/RH: ABO/RH(D): O NEG

## 2020-08-05 LAB — POCT I-STAT 7, (LYTES, BLD GAS, ICA,H+H)
Acid-base deficit: 1 mmol/L (ref 0.0–2.0)
Acid-base deficit: 1 mmol/L (ref 0.0–2.0)
Bicarbonate: 22.8 mmol/L (ref 20.0–28.0)
Bicarbonate: 23.6 mmol/L (ref 20.0–28.0)
Calcium, Ion: 1.1 mmol/L — ABNORMAL LOW (ref 1.15–1.40)
Calcium, Ion: 1.11 mmol/L — ABNORMAL LOW (ref 1.15–1.40)
HCT: 37 % — ABNORMAL LOW (ref 39.0–52.0)
HCT: 38 % — ABNORMAL LOW (ref 39.0–52.0)
Hemoglobin: 12.6 g/dL — ABNORMAL LOW (ref 13.0–17.0)
Hemoglobin: 12.9 g/dL — ABNORMAL LOW (ref 13.0–17.0)
O2 Saturation: 100 %
O2 Saturation: 100 %
Patient temperature: 36
Potassium: 3.9 mmol/L (ref 3.5–5.1)
Potassium: 4.1 mmol/L (ref 3.5–5.1)
Sodium: 137 mmol/L (ref 135–145)
Sodium: 138 mmol/L (ref 135–145)
TCO2: 24 mmol/L (ref 22–32)
TCO2: 25 mmol/L (ref 22–32)
pCO2 arterial: 33 mmHg (ref 32.0–48.0)
pCO2 arterial: 39.7 mmHg (ref 32.0–48.0)
pH, Arterial: 7.382 (ref 7.350–7.450)
pH, Arterial: 7.444 (ref 7.350–7.450)
pO2, Arterial: 162 mmHg — ABNORMAL HIGH (ref 83.0–108.0)
pO2, Arterial: 200 mmHg — ABNORMAL HIGH (ref 83.0–108.0)

## 2020-08-05 LAB — CBC WITH DIFFERENTIAL/PLATELET
Abs Immature Granulocytes: 0.09 10*3/uL — ABNORMAL HIGH (ref 0.00–0.07)
Basophils Absolute: 0 10*3/uL (ref 0.0–0.1)
Basophils Relative: 0 %
Eosinophils Absolute: 0 10*3/uL (ref 0.0–0.5)
Eosinophils Relative: 0 %
HCT: 44.5 % (ref 39.0–52.0)
Hemoglobin: 14.2 g/dL (ref 13.0–17.0)
Immature Granulocytes: 1 %
Lymphocytes Relative: 6 %
Lymphs Abs: 0.7 10*3/uL (ref 0.7–4.0)
MCH: 27 pg (ref 26.0–34.0)
MCHC: 31.9 g/dL (ref 30.0–36.0)
MCV: 84.8 fL (ref 80.0–100.0)
Monocytes Absolute: 0.3 10*3/uL (ref 0.1–1.0)
Monocytes Relative: 3 %
Neutro Abs: 10.2 10*3/uL — ABNORMAL HIGH (ref 1.7–7.7)
Neutrophils Relative %: 90 %
Platelets: 257 10*3/uL (ref 150–400)
RBC: 5.25 MIL/uL (ref 4.22–5.81)
RDW: 13.8 % (ref 11.5–15.5)
WBC: 11.3 10*3/uL — ABNORMAL HIGH (ref 4.0–10.5)
nRBC: 0 % (ref 0.0–0.2)

## 2020-08-05 LAB — COMPREHENSIVE METABOLIC PANEL
ALT: 19 U/L (ref 0–44)
AST: 16 U/L (ref 15–41)
Albumin: 3.4 g/dL — ABNORMAL LOW (ref 3.5–5.0)
Alkaline Phosphatase: 40 U/L (ref 38–126)
Anion gap: 9 (ref 5–15)
BUN: 17 mg/dL (ref 8–23)
CO2: 25 mmol/L (ref 22–32)
Calcium: 8.8 mg/dL — ABNORMAL LOW (ref 8.9–10.3)
Chloride: 106 mmol/L (ref 98–111)
Creatinine, Ser: 1.14 mg/dL (ref 0.61–1.24)
GFR calc Af Amer: >60 mL/min (ref >60)
GFR calc non Af Amer: >60 mL/min (ref >60)
Glucose, Bld: 119 mg/dL — ABNORMAL HIGH (ref 70–99)
Potassium: 4.2 mmol/L (ref 3.5–5.1)
Sodium: 140 mmol/L (ref 135–145)
Total Bilirubin: 0.7 mg/dL (ref 0.3–1.2)
Total Protein: 6 g/dL — ABNORMAL LOW (ref 6.5–8.1)

## 2020-08-05 LAB — SURGICAL PCR SCREEN
MRSA, PCR: NEGATIVE
Staphylococcus aureus: NEGATIVE

## 2020-08-05 LAB — PHOSPHORUS: Phosphorus: 3.1 mg/dL (ref 2.5–4.6)

## 2020-08-05 LAB — GLUCOSE, CAPILLARY
Glucose-Capillary: 111 mg/dL — ABNORMAL HIGH (ref 70–99)
Glucose-Capillary: 190 mg/dL — ABNORMAL HIGH (ref 70–99)
Glucose-Capillary: 135 mg/dL — ABNORMAL HIGH (ref 70–99)

## 2020-08-05 LAB — MAGNESIUM: Magnesium: 2.2 mg/dL (ref 1.7–2.4)

## 2020-08-05 LAB — PREPARE RBC (CROSSMATCH)

## 2020-08-05 SURGERY — CRANIOTOMY TUMOR EXCISION
Anesthesia: General

## 2020-08-05 MED ORDER — CEFAZOLIN SODIUM-DEXTROSE 1-4 GM/50ML-% IV SOLN
1.0000 g | Freq: Three times a day (TID) | INTRAVENOUS | Status: AC
  Administered 2020-08-05 – 2020-08-06 (×3): 1 g via INTRAVENOUS
  Filled 2020-08-05 (×3): qty 50

## 2020-08-05 MED ORDER — PHENYLEPHRINE HCL-NACL 10-0.9 MG/250ML-% IV SOLN
INTRAVENOUS | Status: DC | PRN
  Administered 2020-08-05: 20 ug/min via INTRAVENOUS

## 2020-08-05 MED ORDER — ENALAPRILAT 1.25 MG/ML IV SOLN: 1.2500 mg | Freq: Four times a day (QID) | INTRAVENOUS | Status: DC | PRN

## 2020-08-05 MED ORDER — LABETALOL HCL 5 MG/ML IV SOLN
INTRAVENOUS | Status: AC
  Filled 2020-08-05: qty 4

## 2020-08-05 MED ORDER — FENTANYL CITRATE (PF) 250 MCG/5ML IJ SOLN
INTRAMUSCULAR | Status: DC | PRN
Start: 2020-08-05 — End: 2020-08-05
  Administered 2020-08-05: 50 ug via INTRAVENOUS

## 2020-08-05 MED ORDER — LIDOCAINE-EPINEPHRINE 1 %-1:100000 IJ SOLN
INTRAMUSCULAR | Status: DC | PRN
  Administered 2020-08-05: 10 mL

## 2020-08-05 MED ORDER — FLEET ENEMA 7-19 GM/118ML RE ENEM: 1.0000 | ENEMA | Freq: Once | RECTAL | Status: DC | PRN

## 2020-08-05 MED ORDER — PROMETHAZINE HCL 12.5 MG PO TABS
12.5000 mg | ORAL_TABLET | ORAL | Status: DC | PRN
  Filled 2020-08-05: qty 2

## 2020-08-05 MED ORDER — LEVETIRACETAM 500 MG PO TABS
500.0000 mg | ORAL_TABLET | Freq: Two times a day (BID) | ORAL | Status: DC
  Administered 2020-08-05 – 2020-08-07 (×5): 500 mg via ORAL
  Filled 2020-08-05 (×5): qty 1

## 2020-08-05 MED ORDER — HYDRALAZINE HCL 20 MG/ML IJ SOLN
5.0000 mg | Freq: Once | INTRAMUSCULAR | Status: AC
  Administered 2020-08-05: 5 mg via INTRAVENOUS

## 2020-08-05 MED ORDER — MIDAZOLAM HCL 2 MG/2ML IJ SOLN
INTRAMUSCULAR | Status: AC
  Filled 2020-08-05: qty 2

## 2020-08-05 MED ORDER — SODIUM CHLORIDE 0.9 % IV SOLN
INTRAVENOUS | Status: DC | PRN
  Administered 2020-08-05: 250 mL via INTRAVENOUS

## 2020-08-05 MED ORDER — LIDOCAINE-EPINEPHRINE 1 %-1:100000 IJ SOLN
INTRAMUSCULAR | Status: AC
  Filled 2020-08-05: qty 1

## 2020-08-05 MED ORDER — SODIUM CHLORIDE 0.9 % IV SOLN
0.0500 ug/kg/min | INTRAVENOUS | Status: AC
  Administered 2020-08-05: .2 ug/kg/min via INTRAVENOUS
  Filled 2020-08-05: qty 5000

## 2020-08-05 MED ORDER — ENOXAPARIN SODIUM 40 MG/0.4ML ~~LOC~~ SOLN
40.0000 mg | SUBCUTANEOUS | Status: DC
  Administered 2020-08-06 – 2020-08-07 (×2): 40 mg via SUBCUTANEOUS
  Filled 2020-08-05 (×2): qty 0.4

## 2020-08-05 MED ORDER — THROMBIN 5000 UNITS EX SOLR
CUTANEOUS | Status: AC
  Filled 2020-08-05: qty 5000

## 2020-08-05 MED ORDER — DEXAMETHASONE SODIUM PHOSPHATE 10 MG/ML IJ SOLN
INTRAMUSCULAR | Status: DC | PRN
  Administered 2020-08-05: 10 mg via INTRAVENOUS

## 2020-08-05 MED ORDER — LABETALOL HCL 5 MG/ML IV SOLN
5.0000 mg | Freq: Once | INTRAVENOUS | Status: AC
  Administered 2020-08-05: 5 mg via INTRAVENOUS

## 2020-08-05 MED ORDER — HYDRALAZINE HCL 20 MG/ML IJ SOLN
INTRAMUSCULAR | Status: AC
  Filled 2020-08-05: qty 1

## 2020-08-05 MED ORDER — BACITRACIN ZINC 500 UNIT/GM EX OINT
TOPICAL_OINTMENT | CUTANEOUS | Status: AC
  Filled 2020-08-05: qty 28.35

## 2020-08-05 MED ORDER — CEFAZOLIN SODIUM 1 G IJ SOLR
INTRAMUSCULAR | Status: AC
  Filled 2020-08-05: qty 20

## 2020-08-05 MED ORDER — ONDANSETRON HCL 4 MG/2ML IJ SOLN
INTRAMUSCULAR | Status: AC
  Filled 2020-08-05: qty 2

## 2020-08-05 MED ORDER — LIDOCAINE 2% (20 MG/ML) 5 ML SYRINGE
INTRAMUSCULAR | Status: DC | PRN
  Administered 2020-08-05: 60 mg via INTRAVENOUS

## 2020-08-05 MED ORDER — ACETAMINOPHEN 500 MG PO TABS: 1000.0000 mg | ORAL_TABLET | Freq: Once | ORAL | Status: DC

## 2020-08-05 MED ORDER — ROCURONIUM BROMIDE 10 MG/ML (PF) SYRINGE
PREFILLED_SYRINGE | INTRAVENOUS | Status: AC
  Filled 2020-08-05: qty 20

## 2020-08-05 MED ORDER — PROPOFOL 10 MG/ML IV BOLUS
INTRAVENOUS | Status: AC
  Filled 2020-08-05: qty 20

## 2020-08-05 MED ORDER — ROCURONIUM BROMIDE 10 MG/ML (PF) SYRINGE
PREFILLED_SYRINGE | INTRAVENOUS | Status: AC
  Filled 2020-08-05: qty 10

## 2020-08-05 MED ORDER — NICARDIPINE HCL IN NACL 20-0.86 MG/200ML-% IV SOLN
3.0000 mg/h | INTRAVENOUS | Status: DC
  Administered 2020-08-05 (×2): 10 mg/h via INTRAVENOUS
  Administered 2020-08-05: 5 mg/h via INTRAVENOUS
  Administered 2020-08-05 – 2020-08-06 (×2): 10 mg/h via INTRAVENOUS
  Administered 2020-08-06: 9 mg/h via INTRAVENOUS
  Administered 2020-08-06 (×4): 10 mg/h via INTRAVENOUS
  Filled 2020-08-05: qty 400
  Filled 2020-08-05 (×2): qty 200
  Filled 2020-08-05: qty 400
  Filled 2020-08-05 (×4): qty 200

## 2020-08-05 MED ORDER — LABETALOL HCL 5 MG/ML IV SOLN: 10.0000 mg | INTRAVENOUS | Status: DC | PRN

## 2020-08-05 MED ORDER — THROMBIN 20000 UNITS EX SOLR
CUTANEOUS | Status: DC | PRN
  Administered 2020-08-05: 20 mL via TOPICAL

## 2020-08-05 MED ORDER — ONDANSETRON HCL 4 MG/2ML IJ SOLN: 4.0000 mg | INTRAMUSCULAR | Status: DC | PRN

## 2020-08-05 MED ORDER — 0.9 % SODIUM CHLORIDE (POUR BTL) OPTIME
TOPICAL | Status: DC | PRN
  Administered 2020-08-05: 3000 mL

## 2020-08-05 MED ORDER — CHLORHEXIDINE GLUCONATE CLOTH 2 % EX PADS
6.0000 | MEDICATED_PAD | Freq: Every day | CUTANEOUS | Status: DC
  Administered 2020-08-05 – 2020-08-06 (×2): 6 via TOPICAL

## 2020-08-05 MED ORDER — MORPHINE SULFATE (PF) 2 MG/ML IV SOLN: 1.0000 mg | INTRAVENOUS | Status: DC | PRN

## 2020-08-05 MED ORDER — DEXAMETHASONE SODIUM PHOSPHATE 4 MG/ML IJ SOLN
4.0000 mg | Freq: Four times a day (QID) | INTRAMUSCULAR | Status: DC
  Administered 2020-08-05: 4 mg via INTRAVENOUS
  Filled 2020-08-05: qty 1

## 2020-08-05 MED ORDER — FENTANYL CITRATE (PF) 100 MCG/2ML IJ SOLN: 25.0000 ug | INTRAMUSCULAR | Status: DC | PRN

## 2020-08-05 MED ORDER — LIDOCAINE 2% (20 MG/ML) 5 ML SYRINGE
INTRAMUSCULAR | Status: AC
  Filled 2020-08-05: qty 10

## 2020-08-05 MED ORDER — BACITRACIN ZINC 500 UNIT/GM EX OINT
TOPICAL_OINTMENT | CUTANEOUS | Status: DC | PRN
  Administered 2020-08-05 (×2): 1 via TOPICAL

## 2020-08-05 MED ORDER — ACETAMINOPHEN 650 MG RE SUPP: 650.0000 mg | RECTAL | Status: DC | PRN

## 2020-08-05 MED ORDER — THROMBIN 5000 UNITS EX SOLR
OROMUCOSAL | Status: DC | PRN
  Administered 2020-08-05 (×2): 5 mL via TOPICAL

## 2020-08-05 MED ORDER — POLYETHYLENE GLYCOL 3350 17 G PO PACK: 17.0000 g | PACK | Freq: Every day | ORAL | Status: DC | PRN

## 2020-08-05 MED ORDER — FENTANYL CITRATE (PF) 250 MCG/5ML IJ SOLN
INTRAMUSCULAR | Status: AC
  Filled 2020-08-05: qty 5

## 2020-08-05 MED ORDER — DOCUSATE SODIUM 100 MG PO CAPS
100.0000 mg | ORAL_CAPSULE | Freq: Two times a day (BID) | ORAL | Status: DC
  Administered 2020-08-05 – 2020-08-07 (×4): 100 mg via ORAL
  Filled 2020-08-05 (×5): qty 1

## 2020-08-05 MED ORDER — ONDANSETRON HCL 4 MG/2ML IJ SOLN
INTRAMUSCULAR | Status: DC | PRN
  Administered 2020-08-05: 4 mg via INTRAVENOUS

## 2020-08-05 MED ORDER — PROPOFOL 10 MG/ML IV BOLUS
INTRAVENOUS | Status: DC | PRN
  Administered 2020-08-05 (×2): 50 mg via INTRAVENOUS
  Administered 2020-08-05: 150 mg via INTRAVENOUS

## 2020-08-05 MED ORDER — HYDROCODONE-ACETAMINOPHEN 5-325 MG PO TABS: 1.0000 | ORAL_TABLET | ORAL | Status: DC | PRN

## 2020-08-05 MED ORDER — MANNITOL 25 % IV SOLN
INTRAVENOUS | Status: DC | PRN
  Administered 2020-08-05: 38.8 g via INTRAVENOUS

## 2020-08-05 MED ORDER — CEFAZOLIN SODIUM-DEXTROSE 2-3 GM-%(50ML) IV SOLR
INTRAVENOUS | Status: DC | PRN
  Administered 2020-08-05: 2 g via INTRAVENOUS

## 2020-08-05 MED ORDER — SODIUM CHLORIDE 0.9 % IR SOLN
Status: DC | PRN
  Administered 2020-08-05: 1000 mL

## 2020-08-05 MED ORDER — SENNA 8.6 MG PO TABS
1.0000 | ORAL_TABLET | Freq: Two times a day (BID) | ORAL | Status: DC
  Administered 2020-08-05 – 2020-08-07 (×4): 8.6 mg via ORAL
  Filled 2020-08-05 (×5): qty 1

## 2020-08-05 MED ORDER — THROMBIN 20000 UNITS EX SOLR
CUTANEOUS | Status: AC
  Filled 2020-08-05: qty 20000

## 2020-08-05 MED ORDER — SODIUM CHLORIDE 0.9 % IV SOLN: INTRAVENOUS | Status: DC | PRN

## 2020-08-05 MED ORDER — ACETAMINOPHEN 325 MG PO TABS: 650.0000 mg | ORAL_TABLET | ORAL | Status: DC | PRN

## 2020-08-05 MED ORDER — ROCURONIUM BROMIDE 10 MG/ML (PF) SYRINGE
PREFILLED_SYRINGE | INTRAVENOUS | Status: DC | PRN
  Administered 2020-08-05: 80 mg via INTRAVENOUS
  Administered 2020-08-05: 10 mg via INTRAVENOUS
  Administered 2020-08-05: 20 mg via INTRAVENOUS
  Administered 2020-08-05: 30 mg via INTRAVENOUS

## 2020-08-05 MED ORDER — DEXAMETHASONE SODIUM PHOSPHATE 10 MG/ML IJ SOLN
INTRAMUSCULAR | Status: AC
  Filled 2020-08-05: qty 1

## 2020-08-05 MED ORDER — DEXAMETHASONE SODIUM PHOSPHATE 4 MG/ML IJ SOLN
4.0000 mg | Freq: Four times a day (QID) | INTRAMUSCULAR | Status: DC
  Administered 2020-08-05 – 2020-08-07 (×7): 4 mg via INTRAVENOUS
  Filled 2020-08-05 (×7): qty 1

## 2020-08-05 MED ORDER — ONDANSETRON HCL 4 MG PO TABS: 4.0000 mg | ORAL_TABLET | ORAL | Status: DC | PRN

## 2020-08-05 MED ORDER — EPHEDRINE 5 MG/ML INJ
INTRAVENOUS | Status: AC
  Filled 2020-08-05: qty 10

## 2020-08-05 MED ORDER — CLEVIDIPINE BUTYRATE 0.5 MG/ML IV EMUL
INTRAVENOUS | Status: DC | PRN
  Administered 2020-08-05: 8 mg/h via INTRAVENOUS

## 2020-08-05 MED ORDER — PANTOPRAZOLE SODIUM 40 MG PO TBEC
40.0000 mg | DELAYED_RELEASE_TABLET | Freq: Every day | ORAL | Status: DC
  Administered 2020-08-05 – 2020-08-07 (×3): 40 mg via ORAL
  Filled 2020-08-05 (×3): qty 1

## 2020-08-05 SURGICAL SUPPLY — 106 items
APL SKNCLS STERI-STRIP NONHPOA (GAUZE/BANDAGES/DRESSINGS)
BAND INSRT 18 STRL LF DISP RB (MISCELLANEOUS)
BAND RUBBER #18 3X1/16 STRL (MISCELLANEOUS) IMPLANT
BENZOIN TINCTURE PRP APPL 2/3 (GAUZE/BANDAGES/DRESSINGS) IMPLANT
BLADE CLIPPER SURG (BLADE) ×3 IMPLANT
BLADE SAW GIGLI 16 STRL (MISCELLANEOUS) IMPLANT
BLADE SURG 11 STRL SS (BLADE) ×3 IMPLANT
BLADE SURG 15 STRL LF DISP TIS (BLADE) IMPLANT
BLADE SURG 15 STRL SS (BLADE)
BNDG CMPR 75X41 PLY HI ABS (GAUZE/BANDAGES/DRESSINGS)
BNDG GAUZE ELAST 4 BULKY (GAUZE/BANDAGES/DRESSINGS) IMPLANT
BNDG STRETCH 4X75 STRL LF (GAUZE/BANDAGES/DRESSINGS) IMPLANT
BUR ACORN 9.0 PRECISION (BURR) ×2 IMPLANT
BUR ACORN 9.0MM PRECISION (BURR) ×1
BUR ROUND FLUTED 4 SOFT TCH (BURR) ×1 IMPLANT
BUR ROUND FLUTED 4MM SOFT TCH (BURR) ×1
BUR SPIRAL ROUTER 2.3 (BUR) ×2 IMPLANT
BUR SPIRAL ROUTER 2.3MM (BUR) ×1
CANISTER SUCT 3000ML PPV (MISCELLANEOUS) ×6 IMPLANT
CATH VENTRIC 35X38 W/TROCAR LG (CATHETERS) IMPLANT
CLIP VESOCCLUDE MED 6/CT (CLIP) IMPLANT
CNTNR URN SCR LID CUP LEK RST (MISCELLANEOUS) ×1 IMPLANT
CONT SPEC 4OZ STRL OR WHT (MISCELLANEOUS) ×6
COVER BURR HOLE UNIV 10 (Orthopedic Implant) ×4 IMPLANT
COVER MAYO STAND STRL (DRAPES) IMPLANT
COVER WAND RF STERILE (DRAPES) ×1 IMPLANT
DECANTER SPIKE VIAL GLASS SM (MISCELLANEOUS) ×3 IMPLANT
DRAIN SUBARACHNOID (WOUND CARE) IMPLANT
DRAPE HALF SHEET 40X57 (DRAPES) ×3 IMPLANT
DRAPE MICROSCOPE LEICA (MISCELLANEOUS) IMPLANT
DRAPE NEUROLOGICAL W/INCISE (DRAPES) ×3 IMPLANT
DRAPE STERI IOBAN 125X83 (DRAPES) IMPLANT
DRAPE SURG 17X23 STRL (DRAPES) IMPLANT
DRAPE WARM FLUID 44X44 (DRAPES) ×3 IMPLANT
DRSG ADAPTIC 3X8 NADH LF (GAUZE/BANDAGES/DRESSINGS) IMPLANT
DRSG OPSITE POSTOP 4X6 (GAUZE/BANDAGES/DRESSINGS) ×2 IMPLANT
DRSG TELFA 3X8 NADH (GAUZE/BANDAGES/DRESSINGS) IMPLANT
DURAPREP 6ML APPLICATOR 50/CS (WOUND CARE) ×3 IMPLANT
ELECT COATED BLADE 2.86 ST (ELECTRODE) ×3 IMPLANT
ELECT REM PT RETURN 9FT ADLT (ELECTROSURGICAL) ×3
ELECTRODE REM PT RTRN 9FT ADLT (ELECTROSURGICAL) ×1 IMPLANT
EVACUATOR 1/8 PVC DRAIN (DRAIN) IMPLANT
EVACUATOR SILICONE 100CC (DRAIN) IMPLANT
FORCEPS BIPO MALIS IRRIG 9X1.5 (NEUROSURGERY SUPPLIES) ×2 IMPLANT
FORCEPS BIPOLAR SPETZLER 8 1.0 (NEUROSURGERY SUPPLIES) ×3 IMPLANT
GAUZE 4X4 16PLY RFD (DISPOSABLE) IMPLANT
GAUZE SPONGE 4X4 12PLY STRL (GAUZE/BANDAGES/DRESSINGS) IMPLANT
GLOVE BIO SURGEON STRL SZ7.5 (GLOVE) ×3 IMPLANT
GLOVE BIOGEL PI IND STRL 7.5 (GLOVE) ×1 IMPLANT
GLOVE BIOGEL PI INDICATOR 7.5 (GLOVE) ×2
GLOVE ECLIPSE 7.5 STRL STRAW (GLOVE) IMPLANT
GLOVE EXAM NITRILE LRG STRL (GLOVE) IMPLANT
GLOVE EXAM NITRILE XL STR (GLOVE) IMPLANT
GOWN STRL REUS W/ TWL LRG LVL3 (GOWN DISPOSABLE) ×2 IMPLANT
GOWN STRL REUS W/ TWL XL LVL3 (GOWN DISPOSABLE) IMPLANT
GOWN STRL REUS W/TWL 2XL LVL3 (GOWN DISPOSABLE) IMPLANT
GOWN STRL REUS W/TWL LRG LVL3 (GOWN DISPOSABLE) ×6
GOWN STRL REUS W/TWL XL LVL3 (GOWN DISPOSABLE)
GRAFT DURAGEN MATRIX 3WX3L (Graft) ×3 IMPLANT
GRAFT DURAGEN MATRIX 3X3 SNGL (Graft) IMPLANT
HEMOSTAT POWDER KIT SURGIFOAM (HEMOSTASIS) ×5 IMPLANT
HEMOSTAT SURGICEL 2X14 (HEMOSTASIS) ×3 IMPLANT
HOOK DURA 1/2IN (MISCELLANEOUS) ×3 IMPLANT
IV NS 1000ML (IV SOLUTION) ×3
IV NS 1000ML BAXH (IV SOLUTION) ×1 IMPLANT
KIT BASIN OR (CUSTOM PROCEDURE TRAY) ×3 IMPLANT
KIT DRAIN CSF ACCUDRAIN (MISCELLANEOUS) IMPLANT
KIT NDL BIOPSY 1.8X235 CRAN (NEEDLE) ×1 IMPLANT
KIT NEEDLE BIOPSY 1.8X235 CRAN (NEEDLE) IMPLANT
KIT TURNOVER KIT B (KITS) ×3 IMPLANT
KIT VARIOGUIDE DRILL 1.9 (KITS) ×1 IMPLANT
MARKER SPHERE PSV REFLC 13MM (MARKER) ×6 IMPLANT
NDL SPNL 18GX3.5 QUINCKE PK (NEEDLE) IMPLANT
NEEDLE HYPO 22GX1.5 SAFETY (NEEDLE) ×3 IMPLANT
NEEDLE SPNL 18GX3.5 QUINCKE PK (NEEDLE) IMPLANT
NS IRRIG 1000ML POUR BTL (IV SOLUTION) ×9 IMPLANT
PACK BATTERY CMF DISP FOR DVR (ORTHOPEDIC DISPOSABLE SUPPLIES) ×2 IMPLANT
PACK CRANIOTOMY CUSTOM (CUSTOM PROCEDURE TRAY) ×3 IMPLANT
PAD DRESSING TELFA 3X8 NADH (GAUZE/BANDAGES/DRESSINGS) IMPLANT
PATTIES SURGICAL .25X.25 (GAUZE/BANDAGES/DRESSINGS) IMPLANT
PATTIES SURGICAL .5 X.5 (GAUZE/BANDAGES/DRESSINGS) IMPLANT
PATTIES SURGICAL .5 X3 (DISPOSABLE) ×2 IMPLANT
PATTIES SURGICAL 1/4 X 3 (GAUZE/BANDAGES/DRESSINGS) IMPLANT
PATTIES SURGICAL 1X1 (DISPOSABLE) IMPLANT
PIN MAYFIELD SKULL DISP (PIN) ×3 IMPLANT
PLATE CRANIAL 12 2H RIGID UNI (Plate) ×4 IMPLANT
SCREW UNIII AXS SD 1.5X4 (Screw) ×24 IMPLANT
SET TUBING IRRIGATION DISP (TUBING) ×2 IMPLANT
SPONGE NEURO XRAY DETECT 1X3 (DISPOSABLE) IMPLANT
SPONGE SURGIFOAM ABS GEL 100 (HEMOSTASIS) ×3 IMPLANT
STAPLER VISISTAT 35W (STAPLE) ×5 IMPLANT
SUT ETHILON 3 0 FSL (SUTURE) IMPLANT
SUT ETHILON 3 0 PS 1 (SUTURE) IMPLANT
SUT MNCRL AB 3-0 PS2 18 (SUTURE) IMPLANT
SUT MON AB 3-0 SH 27 (SUTURE) ×3
SUT MON AB 3-0 SH27 (SUTURE) IMPLANT
SUT NURALON 4 0 TR CR/8 (SUTURE) ×5 IMPLANT
SUT SILK 0 TIES 10X30 (SUTURE) IMPLANT
SUT VIC AB 2-0 CP2 18 (SUTURE) ×7 IMPLANT
TOWEL GREEN STERILE (TOWEL DISPOSABLE) ×3 IMPLANT
TOWEL GREEN STERILE FF (TOWEL DISPOSABLE) ×3 IMPLANT
TRAY FOLEY MTR SLVR 16FR STAT (SET/KITS/TRAYS/PACK) ×3 IMPLANT
TUBE CONNECTING 12'X1/4 (SUCTIONS) ×1
TUBE CONNECTING 12X1/4 (SUCTIONS) ×2 IMPLANT
UNDERPAD 30X36 HEAVY ABSORB (UNDERPADS AND DIAPERS) ×3 IMPLANT
WATER STERILE IRR 1000ML POUR (IV SOLUTION) ×3 IMPLANT

## 2020-08-05 NOTE — Anesthesia Postprocedure Evaluation (Signed)
Anesthesia Post Note  Patient: JOSIEL GAHM  Procedure(s) Performed: CRANIOTOMY TUMOR EXCISION (N/A ) APPLICATION OF CRANIAL NAVIGATION (N/A )     Patient location during evaluation: PACU Anesthesia Type: General Level of consciousness: awake and alert Pain management: pain level controlled Vital Signs Assessment: post-procedure vital signs reviewed and stable Respiratory status: spontaneous breathing, nonlabored ventilation, respiratory function stable and patient connected to nasal cannula oxygen Cardiovascular status: blood pressure returned to baseline and stable Postop Assessment: no apparent nausea or vomiting Anesthetic complications: no   No complications documented.  Last Vitals:  Vitals:   08/05/20 1310 08/05/20 1340  BP: 124/71 130/68  Pulse: 60 (!) 59  Resp: 14 19  Temp:  36.7 C  SpO2: 96% 95%    Last Pain:  Vitals:   08/05/20 1340  TempSrc:   PainSc: 0-No pain                 Yassine Brunsman L [place]

## 2020-08-05 NOTE — Anesthesia Procedure Notes (Signed)
Arterial Line Insertion Start/End8/27/2021 7:10 AM Performed by: Janace Litten, CRNA, CRNA  Preanesthetic checklist: patient identified, IV checked, risks and benefits discussed, surgical consent, monitors and equipment checked and pre-op evaluation Lidocaine 1% used for infiltration Left, radial was placed Catheter size: 20 G Hand hygiene performed  and maximum sterile barriers used   Attempts: 1 Procedure performed without using ultrasound guided technique. Following insertion, dressing applied and Biopatch. Post procedure assessment: normal  Patient tolerated the procedure well with no immediate complications.

## 2020-08-05 NOTE — Progress Notes (Signed)
PROGRESS NOTE    [name]  YBO:[id number] DOB: Jan 23, 1956 DOA: 08/02/2020 PCP: [name], MD   Brief Narrative:  HPI per Dr. Harrold Donath on 08/02/20  Walter Rodriguez is a 64 y.o. male with no chronic medical problems that he does not take any chronic medications.  He was diagnosed with melanoma a year ago and had a melanoma surgically removed from his upper central back a year ago.  For the last 6 weeks he has been having difficulty with walking being off balance and a shuffling gait.  He has also had some weakness in his left leg.  A few days ago he was walking in the woods when he fell and was unable to get up and called his family to come and get him.  After that episode he did decided he would go for evaluation and saw his PCP and had a CT scan of the head which revealed a brain tumor.  He was then referred for an MRI which shows brain tumor with surrounding edema and some midline shift.  He was started on Decadron by his outpatient physician yesterday due to the edema around the mass in his brain on CT scan.  States he has not had any fever, chills, cough, chest pain, palpitations, urinary hesitancy, frequency or incontinence.  He has not had any abdominal pain, nausea, vomiting, diarrhea.  ED Course:   ER provider consulted neurosurgery who requested a CT of the chest abdomen and pelvis be performed and for medicine to admit patient for further work-up.  Neurosurgery stated they would prescribe and manage the steroids and will start patient on Keppra.  **Interim History He was started on steroids and Keppra.  Neurosurgery evaluating and took the patient for a Right craniotomy for excision of the Brain Tumor today.  The night before last he had a temperature of 100.9 but he states that he was feeling hot and had a lot of covers and the temperature change in the room.  Fever was isolated he has had no further recurrence.  He does have elevated WBC but likely in the setting of steroid  margination.  If he continued to spike a temperature but we will panculture him and possibly start empiric antibiotics.  Post-operatively he was seen in the PACU and had no pain or complaints.    Assessment & Plan:   Principal Problem:   Neoplasm of brain causing mass effect on adjacent structures [facility]) Active Problems:   Ataxia   Pre-diabetes   Fever   Leukocytosis  Neoplasm of brain causing mass effect on adjacent structures [facility] s/p Right Craniotomy for excision of Brain Tumor pOd0 -Patient was admitted to medical/surgical floor. -Neurosurgery was consulted in the emergency room and they requested medicine admit patient. -CT of the Head showed "Mass at the junction of the periphery of the frontal and parietal lobes the left measuring 4.5 x 4.0 cm. Extensive surrounding vasogenic edema. 1.1 cm of midline shift the lateral and third ventricles to the left. Fourth ventricle midline. No hemorrhage or acute infarct. Mucosal thickening in several ethmoid air cells. Probable cerumen in each external auditory canal." -MRI Brain showed "Large, large, 49 mm enhancing mass in the right hemisphere with extensive regional vasogenic edema, corresponding to the head CT finding yesterday. I favor the masses intra-axial, with top differential considerations of solitary Metastasis (see #3) versus High-grade primary tumor. An extra-axial Meningioma is felt less likely. Intracranial mass effect with leftward midline shift up to 11 mm. Mild  trapping of the left lateral ventricle with transependymal edema. Recommend Neurosurgery consultation if not already done. Small 6 mm enhancing lesion in the left parietal bone is indeterminate, but suspicious for a small skull metastasis if the lesion in #1 is determined to be metastatic." -Neurosurgery requested a CT of the chest abdomen and pelvis be performed to look for other lesions that may be present; CT Chest/Abd/Pelvis showed "No evidence of recurrent or metastatic  disease within the chest, abdomen, or pelvis.  Aortic atherosclerosis." -Neurosurgery has ordered Decadron and Keppra; currently getting 4 mg IV every 6 schedule of the dexamethasone and he is currently on 500 mg p.o. twice daily of the levetiracetam -Continue with supportive care and continue antiemetics with Zofran 4 mg p.o./IV every 6 as needed for nausea -Neurosurgery has ordered a CT of the head without contrast for staging and thin cuts; Showed "BrainLAB protocol for operative guidance. 4.9 cm right peripheral parietal hyperdense mass with extensive regional vasogenic edema. Mass effect with left-to-right midline shift of 12 mm." -We will add PPI for the patient given concern for his high-dose steroids and for GI prophylaxis. -May Discuss with Neuro-Oncology Dr. [name]  -Neurosurgery recommending craniotomy for resectionand this was done today and he is POD0  Ataxia -Patient replaced on fall precautions and likely from above.   -Consulted PT/OT and they are recommending Home Health PT/OT but may change depending ton Surgery  Hyperglycemia in the setting of Steroid Demargination -Patient's Blood Sugars ranging from 107-150 on daily BMP/CMP's; Blood sugar was 131 this AM  -Check HbA1c and was 6.1 -Continue to Monitor Blood Sugars carefully and will place on Sensitive Novolog SSI AC  Fever -Was isolated and had temperature of 100.9 the day before yesterday -Patient had several blankets on him yesterday  -Continue monitor for recurrence and if patient has another fever we will panculture him and obtain a urinalysis, urine culture, blood cultures x2 and repeat chest x-ray -Patient does have a leukocytosis but likely in the setting of Steroid Demargination -Continue monitor and trend and follow-up temperature curve and repeat CBC in a.m.  Leukocytosis -In the setting of steroid demargination -Patient's WBC went from 8.2 is now 13.6 and improved to 11.3 -Continue to monitor for signs and  symptoms of infection -continue monitor and trend repeat CBC in a.m.  Hx of Melanoma -Had Excision with Right Sentinel Lymph Node Bx done by Dr. Stark Klein in 2020   DVT prophylaxis: Enoxaparin 40 mg sq Daily  Code Status: FULL CODE Family Communication: No family present at bedside  Disposition Plan: Pending further Neurosurgical Intervention and Evaluation; PT OT recommending home health PT for now. He is in the PACU and will go to the ICU   Status is: Inpatient  Remains inpatient appropriate because:Unsafe d/c plan, IV treatments appropriate due to intensity of illness or inability to take PO and Inpatient level of care appropriate due to severity of illness   Dispo: The patient is from: Home              Anticipated d/c is to: TBD              Anticipated d/c date is: 3 days              Patient currently is not medically stable to d/c.  Consultants:   Neurosurgery Dr. Marcello Moores   Procedures: None  Antimicrobials:  Anti-infectives (From admission, onward)   None     Subjective: Seen and examined at bedside and he just  come out of surgery and is awake and denies any complaints.  No nausea or vomiting.  No pain.  Felt okay.  No other concerns or complaints at this time.  Objective: Vitals:   08/05/20 1053 08/05/20 1108 08/05/20 1123 08/05/20 1158  BP: (!) 153/74 (!) 151/74 (!) 143/73 (!) 159/60  Pulse: 68 (!) 56 (!) 53   Resp: 19 12 13    Temp: 98.1 F (36.7 C)     TempSrc:      SpO2: 98% 96% 97%   Weight:      Height:        Intake/Output Summary (Last 24 hours) at 08/05/2020 1208 Last data filed at 08/05/2020 1123 Gross per 24 hour  Intake 1615.2 ml  Output 700 ml  Net 915.2 ml   Filed Weights   08/02/20 1854  Weight: 77.6 kg   Examination: Physical Exam:  Constitutional: WN/WD mildly overweight Caucasian male currently in no acute distress postoperatively  Eyes: PERRL, lids and conjunctivae normal, sclerae anicteric  ENMT: External Ears, Nose appear  normal. Grossly normal hearing. .  Neck: Appears normal, supple, no cervical masses, normal ROM, no appreciable thyromegaly Respiratory: Diminished to auscultation bilaterally, no wheezing, rales, rhonchi or crackles. Normal respiratory effort and patient is not tachypenic. No accessory muscle use.  Cardiovascular: Bradycardic but has a Sinus Rhythm, no murmurs / rubs / gallops. S1 and S2 auscultated. No extremity edema. Abdomen: Soft, non-tender, very mildly distended.  Bowel sounds positive.  GU: Deferred. Musculoskeletal: No clubbing / cyanosis of digits/nails. No joint deformity upper and lower extremities.  Skin: No rashes, lesions, ulcers on limited skin evaluation but he does have some postoperative dressings on his head. No induration; Warm and dry.  Neurologic: CN 2-12 grossly intact with no focal deficits. Romberg sign and cerebellar reflexes not assessed.  Psychiatric: Normal judgment and insight. Alert and oriented x 3. Normal mood and appropriate affect.   Data Reviewed: I have personally reviewed following labs and imaging studies  CBC: Recent Labs  Lab 08/02/20 1916 08/03/20 0201 08/04/20 0641 08/05/20 0445  WBC 10.1 8.2 13.6* 11.3*  NEUTROABS 7.0  --  12.1* 10.2*  HGB 14.4 13.9 14.6 14.2  HCT 45.6 42.5 44.4 44.5  MCV 85.4 85.0 84.9 84.8  PLT 259 261 273 947   Basic Metabolic Panel: Recent Labs  Lab 08/02/20 1916 08/03/20 0201 08/04/20 0641 08/05/20 0445  NA 141 139 140 140  K 3.9 4.3 4.3 4.2  CL 109 109 106 106  CO2 24 24 22 25   GLUCOSE 107* 150* 131* 119*  BUN 18 15 20 17   CREATININE 1.20 1.10 1.15 1.14  CALCIUM 9.0 8.6* 8.9 8.8*  MG  --   --  2.1 2.2  PHOS  --   --  3.0 3.1   GFR: Estimated Creatinine Clearance: 62.7 mL/min (by C-G formula based on SCr of 1.14 mg/dL). Liver Function Tests: Recent Labs  Lab 08/04/20 0641 08/05/20 0445  AST 17 16  ALT 20 19  ALKPHOS 40 40  BILITOT 0.9 0.7  PROT 6.2* 6.0*  ALBUMIN 3.5 3.4*   No results for  input(s): LIPASE, AMYLASE in the last 168 hours. No results for input(s): AMMONIA in the last 168 hours. Coagulation Profile: No results for input(s): INR, PROTIME in the last 168 hours. Cardiac Enzymes: No results for input(s): CKTOTAL, CKMB, CKMBINDEX, TROPONINI in the last 168 hours. BNP (last 3 results) No results for input(s): PROBNP in the last 8760 hours. HbA1C: Recent Labs  08/04/20 0641  HGBA1C 6.1*   CBG: Recent Labs  Lab 08/04/20 1254 08/04/20 1738 08/04/20 2132 08/05/20 0605  GLUCAP 101* 132* 178* 111*   Lipid Profile: No results for input(s): CHOL, HDL, LDLCALC, TRIG, CHOLHDL, LDLDIRECT in the last 72 hours. Thyroid Function Tests: No results for input(s): TSH, T4TOTAL, FREET4, T3FREE, THYROIDAB in the last 72 hours. Anemia Panel: No results for input(s): VITAMINB12, FOLATE, FERRITIN, TIBC, IRON, RETICCTPCT in the last 72 hours. Sepsis Labs: No results for input(s): PROCALCITON, LATICACIDVEN in the last 168 hours.  Recent Results (from the past 240 hour(s))  SARS Coronavirus 2 by RT PCR (hospital order, performed in [facility] hospital lab) Nasopharyngeal Nasopharyngeal Swab     Status: None   Collection Time: 08/02/20  7:16 PM   Specimen: Nasopharyngeal Swab  Result Value Ref Range Status   SARS Coronavirus 2 NEGATIVE NEGATIVE Final    Comment: (NOTE) SARS-CoV-2 target nucleic acids are NOT DETECTED.  The SARS-CoV-2 RNA is generally detectable in upper and lower respiratory specimens during the acute phase of infection. The lowest concentration of SARS-CoV-2 viral copies this assay can detect is 250 copies / mL. A negative result does not preclude SARS-CoV-2 infection and should not be used as the sole basis for treatment or other patient management decisions.  A negative result may occur with improper specimen collection / handling, submission of specimen other than nasopharyngeal swab, presence of viral mutation(s) within the areas targeted by this  assay, and inadequate number of viral copies (<250 copies / mL). A negative result must be combined with clinical observations, patient history, and epidemiological information.  Fact Sheet for Patients:   StrictlyIdeas.no  Fact Sheet for Healthcare Providers: BankingDealers.co.za  This test is not yet approved or  cleared by the [place] FDA and has been authorized for detection and/or diagnosis of SARS-CoV-2 by FDA under an Emergency Use Authorization (EUA).  This EUA will remain in effect (meaning this test can be used) for the duration of the COVID-19 declaration under Section 564(b)(1) of the Act, 21 U.S.C. section 360bbb-3(b)(1), unless the authorization is terminated or revoked sooner.  Performed at [facility], [place] [address] [place], [place] [address]   Surgical pcr screen     Status: None   Collection Time: 08/04/20  9:54 PM   Specimen: Nasal Mucosa; Nasal Swab  Result Value Ref Range Status   MRSA, PCR NEGATIVE NEGATIVE Final   Staphylococcus aureus NEGATIVE NEGATIVE Final    Comment: (NOTE) The Xpert SA Assay (FDA approved for NASAL specimens in patients 65 years of age and older), is one component of a comprehensive surveillance program. It is not intended to diagnose infection nor to guide or monitor treatment. Performed at [facility], [place] [address], [place], [place] [address]      RN Pressure Injury Documentation:     Estimated body mass index is 25.62 kg/m as calculated from the following:   Height as of this encounter: 5' 8.5" (1.74 m).   Weight as of this encounter: 77.6 kg.  Malnutrition Type:      Malnutrition Characteristics:      Nutrition Interventions:           Radiology Studies: CT HEAD WO CONTRAST  Result Date: 08/03/2020 CLINICAL DATA:  Brain tumor.  BrainLAB protocol. EXAM: CT HEAD WITHOUT CONTRAST TECHNIQUE: Contiguous axial images were obtained from the  base of the skull through the vertex without intravenous contrast. COMPARISON:  Head CT 08/01/2020.  MRI  yesterday. FINDINGS: Brain: BrainLAB protocol for operative guidance in localization. Right peripheral parietal hyperdense mass measuring up to 4.9 cm in diameter. Extensive regional vasogenic edema. Mass effect with left-to-right midline shift of 12 mm. No change in ventricular size. Vascular: No abnormal vascular finding. Skull: Negative Sinuses/Orbits: Clear/normal Other: None IMPRESSION: BrainLAB protocol for operative guidance. 4.9 cm right peripheral parietal hyperdense mass with extensive regional vasogenic edema. Mass effect with left-to-right midline shift of 12 mm. Electronically Signed   By: Nelson Chimes M.D.   On: 08/03/2020 19:29   Scheduled Meds: . acetaminophen  1,000 mg Oral Once  . hydrALAZINE      . [MAR Hold] insulin aspart  0-9 Units Subcutaneous TID WC  . labetalol      . labetalol       Continuous Infusions:   LOS: 3 days   Kerney Elbe, DO [facility] PAGER is on [place]  If 7PM-7AM, please contact night-coverage [email or web address]

## 2020-08-05 NOTE — Anesthesia Procedure Notes (Signed)
Procedure Name: Intubation Date/Time: 08/05/2020 8:06 AM Performed by: Janace Litten, CRNA Pre-anesthesia Checklist: Patient identified, Emergency Drugs available, Suction available and Patient being monitored Patient Re-evaluated:Patient Re-evaluated prior to induction Oxygen Delivery Method: Circle System Utilized Preoxygenation: Pre-oxygenation with 100% oxygen Induction Type: IV induction Ventilation: Mask ventilation without difficulty Laryngoscope Size: Mac and 4 Grade View: Grade I Tube type: Oral Tube size: 7.5 mm Number of attempts: 1 Airway Equipment and Method: Stylet Placement Confirmation: ETT inserted through vocal cords under direct vision,  positive ETCO2 and breath sounds checked- equal and bilateral Secured at: 23 cm Tube secured with: Tape Dental Injury: Teeth and Oropharynx as per pre-operative assessment

## 2020-08-05 NOTE — Op Note (Signed)
Procedure(s): CRANIOTOMY HEMATOMA EVACUATION SUBDURAL Procedure Note  Walter Rodriguez male 64 y.o. 08/05/2020  Procedure(s) and Anesthesia Type: 1.  Right craniotomy for excision of brain tumor 2.  Use of computer assisted neuro-navigation  Surgeon(s) and Role:    Marcello Moores, Dorcas Carrow, MD - Primary    Indications: This is a 64 yo M with a history of local melanoma resection who presented to the hospital with progressive apraxia, balance difficulty, weakness, and confusion.  He was found to have a 4-1/2 cm enhancing mass in his right parietal lobe.  Metastatic work-up was negative.  For purposes of both diagnosis as well as relief of mass-effect, surgical excision was recommended.  Risks, benefits, alternatives, and expected convalescence were discussed with the patient and his sister.  Risks discussed included but were not limited to bleeding, pain, infection, seizure, stroke, scar, recurrence, neurologic deficit, coma, and death.  Informed consent was obtained.  Surgeon: Vallarie Mare   Assistants: none  Anesthesia: General endotracheal anesthesia   Procedure in detail:  The patient was brought to the operating room.  A timeout was performed.  General anesthesia was induced and patient was intubated by the anesthesia service.  After appropriate lines and monitors were placed, patient's head was placed in a [place] head holder and affixed to the bed.  Using a preoperative thin cut CT and MRI, a 3D image was used to perform surface match registration with the [place] system.  This allowed for intraoperative neuro navigation.  A curvilinear incision was planned over the tumor using the navigation.  The scalp was shaved, preprepped with alcohol and cleansing solution and prepped and draped in sterile fashion.  1% lidocaine with epinephrine injected into the planned incision.  Incision was made with a 10 blade and monopolar electrocautery was used to open the galea and incised the  temporalis fascia.  The periosteum and temporalis muscle was dissected off the skull and self-retaining retractors were used.  2 bur holes were placed which were used to dissect the dura from the inner table of the skull.  Craniotome was used to perform craniotomy.  Meticulous epidural hemostasis obtained.  The dura was opened in C-shaped manner.  A enlarged and discolored gyrus was identified and correlated with the neuro navigation as the location of the tumor.  A generous corticotomy was made with bipolar electrocautery and microscissors.  A firm multilobulated tumor was encountered.  This tumor appeared to be involving two adjacent gyri with the interceding sulcus involved in the tumor, nestled in a fold between two lobules.  Arterial feeders were coagulated and cut to help liberalize the tumor.  The tumor capsule was dissected from the surrounding edematous white matter.  Circumferential dissection was performed in this manner to allow for an en bloc resection.  The tumor was removed in 1 piece and following resection, the brain appeared much more relaxed.  Meticulous hemostasis was obtained.  A piece of the tumor was sent for frozen section and returned as metastatic malignancy.   The dura was closed with 4-0 Nurolon in a running fashion with a DuraGen onlay placed on top of that.  The bone flap was replaced with Stryker plating system.  The wound was irrigated thoroughly with bacitracin impregnated irrigation.  Temporalis muscle was reapproximated 2-0 Vicryl stitches.  The galea was closed with 2-0 Vicryl sutures in buried fashion.  Skin was closed with staples.  Sterile dressing was then placed on the incision.  Patient was removed from pins and then  extubated by the anesthesia service moving all extremities.  All counts were correct at the end of surgery.  No complications were noted.  Findings: Frozen consistent with metastatic disease  Estimated Blood Loss:  less than 50 mL         Drains: none          Specimens: Brain mass         Implants: Stryker plating        Complications:  * No complications entered in OR log *         Disposition: PACU - hemodynamically stable.         Condition: stable

## 2020-08-05 NOTE — Transfer of Care (Signed)
Immediate Anesthesia Transfer of Care Note  Patient: Walter Rodriguez  Procedure(s) Performed: CRANIOTOMY TUMOR EXCISION (N/A ) APPLICATION OF CRANIAL NAVIGATION (N/A )  Patient Location: PACU  Anesthesia Type:General  Level of Consciousness: awake, alert , oriented, patient cooperative and responds to stimulation  Airway & Oxygen Therapy: Patient Spontanous Breathing  Post-op Assessment: Report given to RN and Post -op Vital signs reviewed and stable  Post vital signs: Reviewed and stable  Last Vitals:  Vitals Value Taken Time  BP 153/74 08/05/20 1053  Temp    Pulse 63 08/05/20 1056  Resp 12 08/05/20 1056  SpO2 98 % 08/05/20 1056  Vitals shown include unvalidated device data.  Last Pain:  Vitals:   08/05/20 0339  TempSrc: Oral  PainSc:          Complications: No complications documented.

## 2020-08-06 ENCOUNTER — Inpatient Hospital Stay (HOSPITAL_COMMUNITY): Payer: Medicare Other

## 2020-08-06 DIAGNOSIS — E663 Overweight: Secondary | ICD-10-CM

## 2020-08-06 LAB — COMPREHENSIVE METABOLIC PANEL
ALT: 29 U/L (ref 0–44)
AST: 24 U/L (ref 15–41)
Albumin: 3.2 g/dL — ABNORMAL LOW (ref 3.5–5.0)
Alkaline Phosphatase: 42 U/L (ref 38–126)
Anion gap: 9 (ref 5–15)
BUN: 20 mg/dL (ref 8–23)
CO2: 21 mmol/L — ABNORMAL LOW (ref 22–32)
Calcium: 8.3 mg/dL — ABNORMAL LOW (ref 8.9–10.3)
Chloride: 108 mmol/L (ref 98–111)
Creatinine, Ser: 1.21 mg/dL (ref 0.61–1.24)
GFR calc Af Amer: >60 mL/min (ref >60)
GFR calc non Af Amer: >60 mL/min (ref >60)
Glucose, Bld: 169 mg/dL — ABNORMAL HIGH (ref 70–99)
Potassium: 3.9 mmol/L (ref 3.5–5.1)
Sodium: 138 mmol/L (ref 135–145)
Total Bilirubin: 0.7 mg/dL (ref 0.3–1.2)
Total Protein: 5.9 g/dL — ABNORMAL LOW (ref 6.5–8.1)

## 2020-08-06 LAB — CBC WITH DIFFERENTIAL/PLATELET
Abs Immature Granulocytes: 0.21 10*3/uL — ABNORMAL HIGH (ref 0.00–0.07)
Basophils Absolute: 0 10*3/uL (ref 0.0–0.1)
Basophils Relative: 0 %
Eosinophils Absolute: 0 10*3/uL (ref 0.0–0.5)
Eosinophils Relative: 0 %
HCT: 43.7 % (ref 39.0–52.0)
Hemoglobin: 14.4 g/dL (ref 13.0–17.0)
Immature Granulocytes: 1 %
Lymphocytes Relative: 4 %
Lymphs Abs: 0.6 10*3/uL — ABNORMAL LOW (ref 0.7–4.0)
MCH: 27.6 pg (ref 26.0–34.0)
MCHC: 33 g/dL (ref 30.0–36.0)
MCV: 83.9 fL (ref 80.0–100.0)
Monocytes Absolute: 0.6 10*3/uL (ref 0.1–1.0)
Monocytes Relative: 4 %
Neutro Abs: 13.8 10*3/uL — ABNORMAL HIGH (ref 1.7–7.7)
Neutrophils Relative %: 91 %
Platelets: 261 10*3/uL (ref 150–400)
RBC: 5.21 MIL/uL (ref 4.22–5.81)
RDW: 13.3 % (ref 11.5–15.5)
WBC: 15.2 10*3/uL — ABNORMAL HIGH (ref 4.0–10.5)
nRBC: 0 % (ref 0.0–0.2)

## 2020-08-06 LAB — GLUCOSE, CAPILLARY
Glucose-Capillary: 131 mg/dL — ABNORMAL HIGH (ref 70–99)
Glucose-Capillary: 133 mg/dL — ABNORMAL HIGH (ref 70–99)
Glucose-Capillary: 139 mg/dL — ABNORMAL HIGH (ref 70–99)
Glucose-Capillary: 134 mg/dL — ABNORMAL HIGH (ref 70–99)

## 2020-08-06 LAB — PHOSPHORUS: Phosphorus: 3 mg/dL (ref 2.5–4.6)

## 2020-08-06 LAB — MAGNESIUM: Magnesium: 2.1 mg/dL (ref 1.7–2.4)

## 2020-08-06 IMAGING — MR MR HEAD WO/W CM
14 of 16 series · 42 of 48 positions shown · IV contrast (gadavist)
Comparison: MRI head with contrast [DATE]

CLINICAL DATA: Craniotomy for tumor resection [DATE]. History
of melanoma.

EXAM:
MRI HEAD WITHOUT AND WITH CONTRAST
TECHNIQUE: Multiplanar, multiecho pulse sequences of the brain and surrounding
structures were obtained without and with intravenous contrast.
CONTRAST:  8mL GADAVIST GADOBUTROL 1 MMOL/ML IV SOLN

[Series 5: DWI · axial · 3.0mm · 0.88mm/px · z∈[-70,+75]mm · 8 of 100 slices shown (1 of 4)]
[im 1/100]
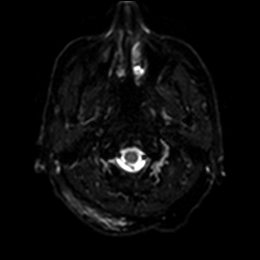
[im 15/100]
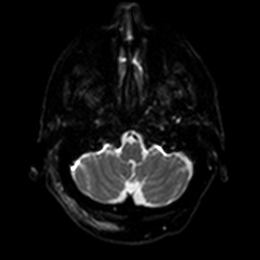
[im 29/100]
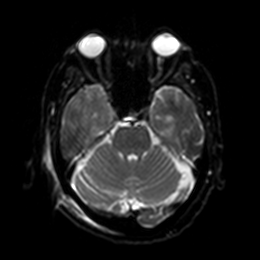
[im 43/100]
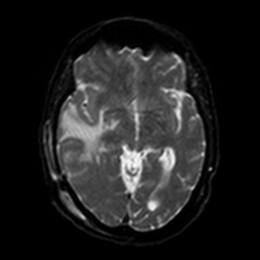
[im 57/100]
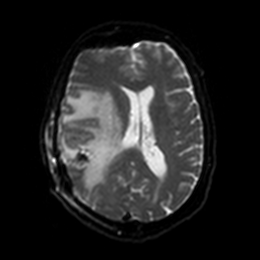
[im 71/100]
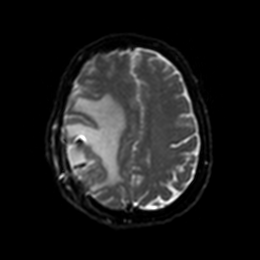
[im 85/100]
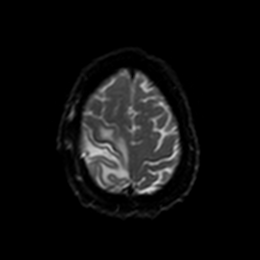
[im 100/100]
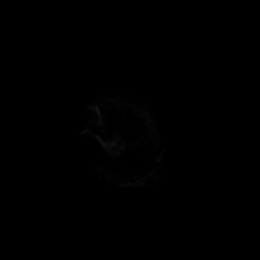

[Series 6: DWI · axial · 3.0mm · 0.88mm/px · z∈[-70,+75]mm · 3 of 49 slices shown (2 of 4)]
[im 1/49]
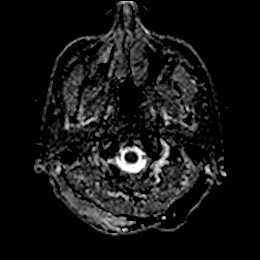
[im 25/49]
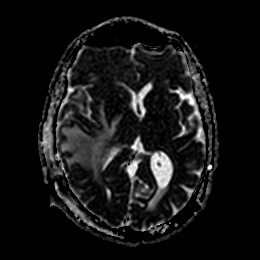
[im 49/49]
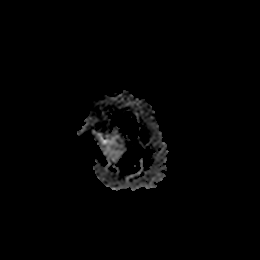

[Series 7: T1 · sagittal · 5.0mm · 0.75mm/px · 2 of 27 slices shown]
[im 1/27]
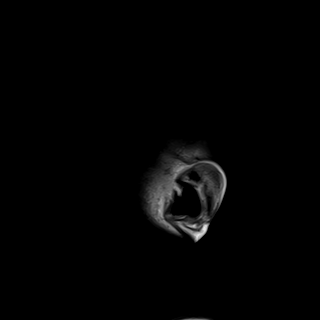
[im 27/27]
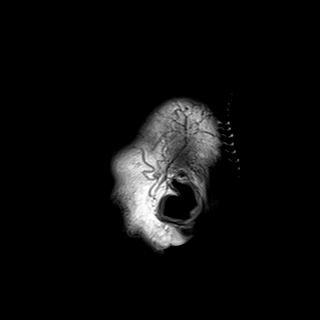

[Series 8: DWI · coronal · 4.0mm · 0.88mm/px · 5 of 70 slices shown (3 of 4)]
[im 1/70]
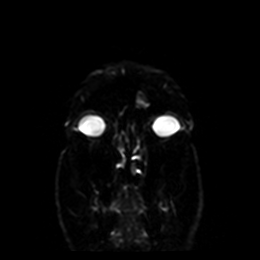
[im 18/70]
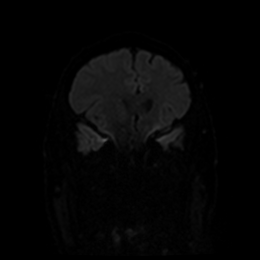
[im 35/70]
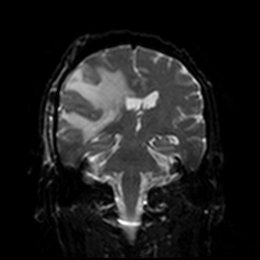
[im 52/70]
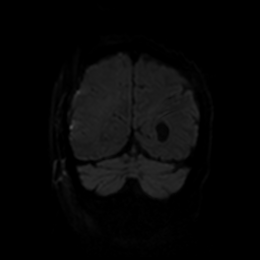
[im 70/70]
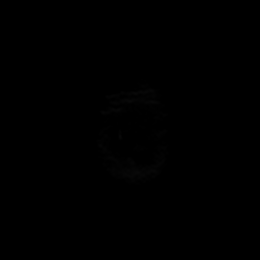

[Series 9: DWI · coronal · 4.0mm · 0.88mm/px · 2 of 35 slices shown (4 of 4)]
[im 1/35]
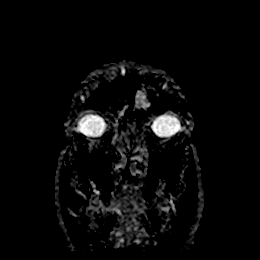
[im 35/35]
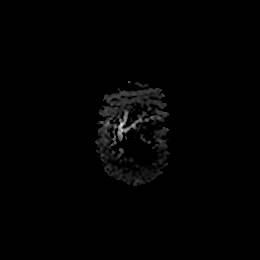

[Series 10: T2 · axial · 5.0mm · 0.72mm/px · z∈[-77,+70]mm · 2 of 26 slices shown]
[im 1/26]
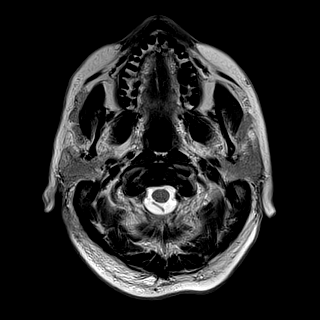
[im 26/26]
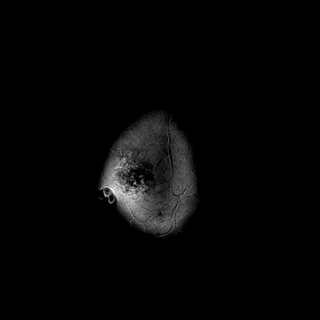

[Series 11: FLAIR · axial · 5.0mm · 0.90mm/px · z∈[-77,+70]mm · 2 of 26 slices shown]
[im 1/26]
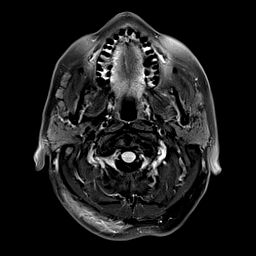
[im 26/26]
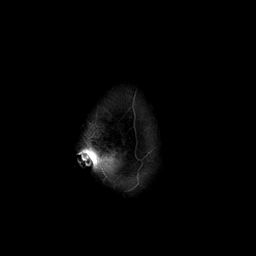

[Series 12: mag_images · axial · 3.0mm · 0.90mm/px · z∈[-79,+71]mm · 3 of 52 slices shown]
[im 1/52]
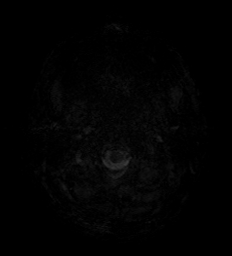
[im 26/52]
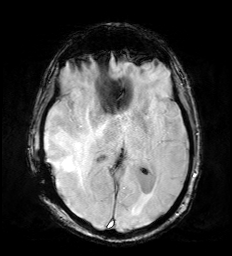
[im 52/52]
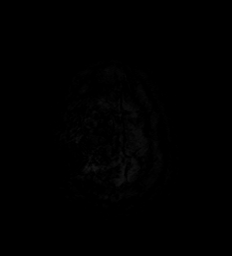

[Series 13: pha_images · axial · 3.0mm · 0.90mm/px · z∈[-79,+71]mm · 3 of 52 slices shown]
[im 1/52]
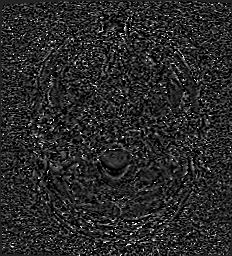
[im 26/52]
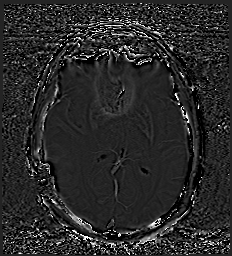
[im 52/52]
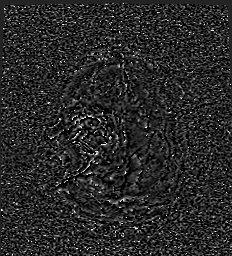

[Series 14: swi_images · axial · 3.0mm · 0.90mm/px · z∈[-79,+71]mm · 3 of 52 slices shown]
[im 1/52]
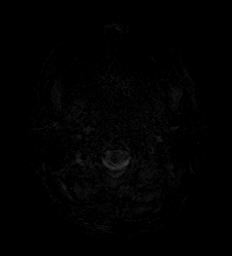
[im 26/52]
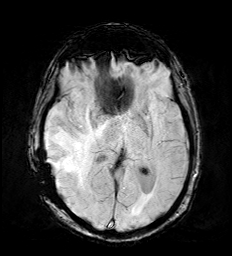
[im 52/52]
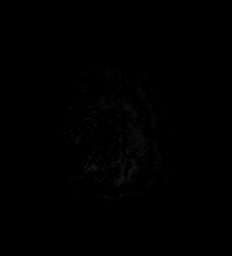

[Series 15: mip_images(sw) · axial · 24.0mm · 0.90mm/px · z∈[-69,+61]mm · 3 of 45 slices shown]
[im 1/45]
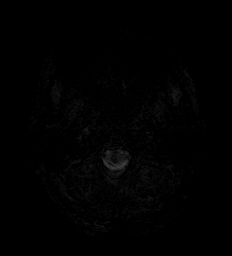
[im 23/45]
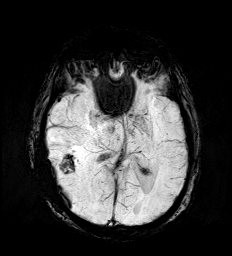
[im 45/45]
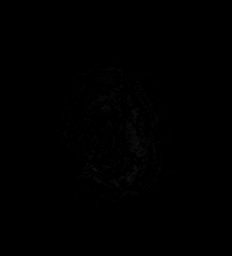

[Series 17: T2 post-contrast · coronal · 5.0mm · 0.72mm/px · 2 of 32 slices shown]
[im 1/32]
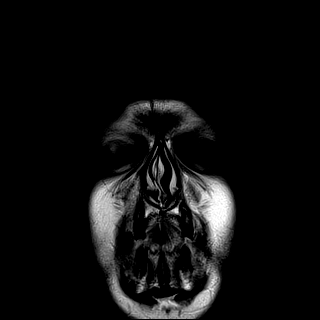
[im 32/32]
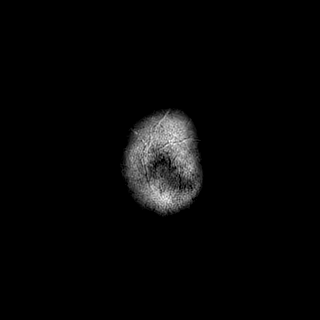

[Series 19: T1 post-contrast · coronal · 5.0mm · 0.34mm/px · 2 of 32 slices shown (1 of 2)]
[im 1/32]
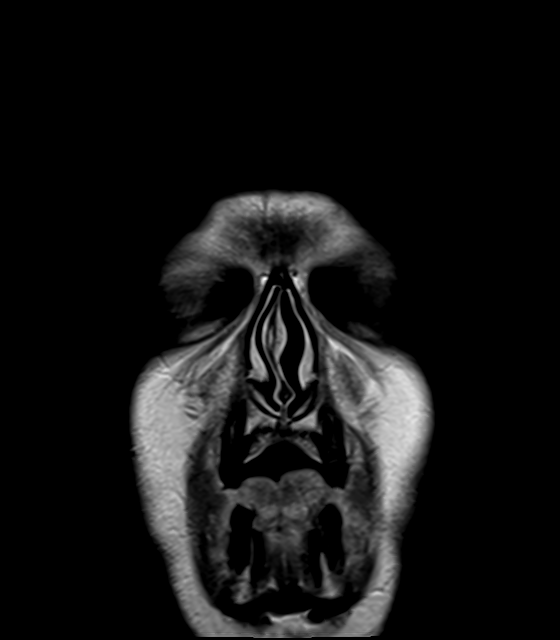
[im 32/32]
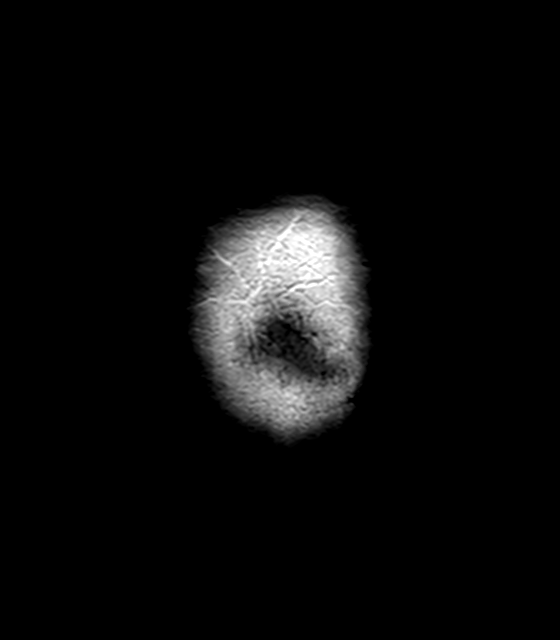

[Series 20: T1 post-contrast · sagittal · 5.0mm · 0.72mm/px · 2 of 27 slices shown (2 of 2)]
[im 1/27]
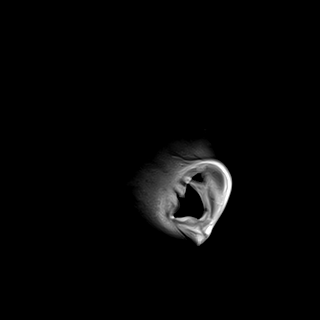
[im 27/27]
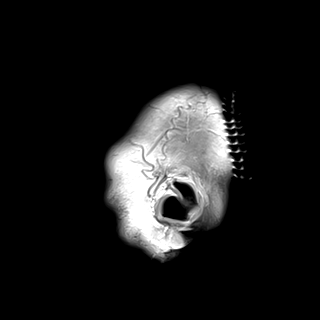

[42 of 48 positions shown; findings below may reference images not displayed]

FINDINGS: Brain: Right parietal craniotomy and resection of right parietal
mass lesion. Surgical cavity contains fluid and blood. No acute
infarct. No residual enhancing mass lesion. The mass did enhance on
the preoperative study. Extensive vasogenic edema throughout the
surrounding white matter similar to the prior study. Mass-effect and
9 mm of midline shift slightly improved.

No other enhancing lesion in the brain is identified.

Vascular: Normal arterial flow voids

Skull and upper cervical spine: 5 mm enhancing lesion in the left
parietal bone unchanged from the prior study. There is erosion of
the inner and outer table. Probable metastatic deposit.

Sinuses/Orbits: Paranasal sinuses clear.  Negative orbit

Other: None
IMPRESSION: Gross total resection of right parietal mass lesion. There remains
extensive vasogenic edema. 9 mm midline shift to the left is mildly
improved. No acute infarct

5 mm enhancing lesion left parietal bone suspicious for metastatic
disease.

## 2020-08-06 MED ORDER — GADOBUTROL 1 MMOL/ML IV SOLN
8.0000 mL | Freq: Once | INTRAVENOUS | Status: AC | PRN
  Administered 2020-08-06: 8 mL via INTRAVENOUS

## 2020-08-06 NOTE — Progress Notes (Signed)
PROGRESS NOTE    Walter Rodriguez  FTD:[id number] DOB: Sep 18, 1956 DOA: 08/02/2020 PCP: [name], MD   Brief Narrative:  HPI per Dr. Harrold Donath on 08/02/20  Walter Rodriguez is a 64 y.o. male with no chronic medical problems that he does not take any chronic medications.  He was diagnosed with melanoma a year ago and had a melanoma surgically removed from his upper central back a year ago.  For the last 6 weeks he has been having difficulty with walking being off balance and a shuffling gait.  He has also had some weakness in his left leg.  A few days ago he was walking in the woods when he fell and was unable to get up and called his family to come and get him.  After that episode he did decided he would go for evaluation and saw his PCP and had a CT scan of the head which revealed a brain tumor.  He was then referred for an MRI which shows brain tumor with surrounding edema and some midline shift.  He was started on Decadron by his outpatient physician yesterday due to the edema around the mass in his brain on CT scan.  States he has not had any fever, chills, cough, chest pain, palpitations, urinary hesitancy, frequency or incontinence.  He has not had any abdominal pain, nausea, vomiting, diarrhea.  ED Course:   ER provider consulted neurosurgery who requested a CT of the chest abdomen and pelvis be performed and for medicine to admit patient for further work-up.  Neurosurgery stated they would prescribe and manage the steroids and will start patient on Keppra.  **Interim History He was started on steroids and Keppra.  Neurosurgery evaluating and took the patient for a Right craniotomy for excision of the Brain Tumor today. During the hospitalization he had a temperature of 100.9 but he states that he was feeling hot and had a lot of covers and the temperature change in the room.  Fever was isolated he has had no further recurrence.  He does have elevated WBC but likely in the setting of  steroid margination.  If he continued to spike a temperature but we will panculture him and possibly start empiric antibiotics.  Yesterday Post-operatively he was seen in the PACU and had no pain or complaints.   08/06/20 He is now in the ICU and Neurosurgery is planning on repeating MRI of the Brain w/wo Contrast today. WBC remains elevated but he is on Steroids. Continues to have an Arterial Line but will be discontinued today. Have PT/OT re-evaluate.    Assessment & Plan:   Principal Problem:   Neoplasm of brain causing mass effect on adjacent structures [facility]) Active Problems:   Ataxia   Pre-diabetes   Fever   Leukocytosis  Neoplasm of brain causing mass effect on adjacent structures [facility]) s/p Right Craniotomy for excision of Brain Tumor POD1  -Patient was admitted to medical/surgical floor. -Neurosurgery was consulted in the emergency room and they requested medicine admit patient. -CT of the Head showed "Mass at the junction of the periphery of the frontal and parietal lobes the left measuring 4.5 x 4.0 cm. Extensive surrounding vasogenic edema. 1.1 cm of midline shift the lateral and third ventricles to the left. Fourth ventricle midline. No hemorrhage or acute infarct. Mucosal thickening in several ethmoid air cells. Probable cerumen in each external auditory canal." -MRI Brain showed "Large, large, 49 mm enhancing mass in the right hemisphere with extensive regional vasogenic edema,  corresponding to the head CT finding yesterday. I favor the masses intra-axial, with top differential considerations of solitary Metastasis (see #3) versus High-grade primary tumor. An extra-axial Meningioma is felt less likely. Intracranial mass effect with leftward midline shift up to 11 mm. Mild trapping of the left lateral ventricle with transependymal edema. Recommend Neurosurgery consultation if not already done. Small 6 mm enhancing lesion in the left parietal bone is indeterminate, but suspicious for  a small skull metastasis if the lesion in #1 is determined to be metastatic." -Neurosurgery requested a CT of the chest abdomen and pelvis be performed to look for other lesions that may be present; CT Chest/Abd/Pelvis showed "No evidence of recurrent or metastatic disease within the chest, abdomen, or pelvis.  Aortic atherosclerosis." -Neurosurgery has ordered Decadron and Keppra; currently getting 4 mg IV every 6 schedule of the dexamethasone and he is currently on 500 mg p.o. twice daily of the levetiracetam -Continue with supportive care and continue antiemetics with Zofran 4 mg p.o./IV every 6 as needed for nausea -Neurosurgery has ordered a CT of the head without contrast for staging and thin cuts; Showed "BrainLAB protocol for operative guidance. 4.9 cm right peripheral parietal hyperdense mass with extensive regional vasogenic edema. Mass effect with left-to-right midline shift of 12 mm." -We will add PPI for the patient given concern for his high-dose steroids and for GI prophylaxis. -May Discuss with Neuro-Oncology Dr. [name]  -Neurosurgery recommending craniotomy for resectionand this was done 08/05/20 and he is POD1 today; He received perioperative Abx with IV Cefazolin  -Neurosurgery planning on repeating an MRI Brain w/wo Contrast today -Arterial Line was placed yesterday to be removed today and Cardene gtt was started post-operatively also to be discontinued as well -Possible D/C tomorrow if Clear by Neurosurgery  Ataxia -Patient replaced on fall precautions and likely from above.   -Consulted PT/OT and they are recommending Home Health PT/OT but may change depending on the Surgery -Will Reconsult PT/OT for evaluation   Hyperglycemia in the setting of Steroid Demargination -Patient's Blood Sugars ranging from 107-150 on daily BMP/CMP's; Blood sugar was 169 this AM  -Check HbA1c and was 6.1 -Continue to Monitor Blood Sugars carefully and will place on Sensitive Novolog SSI AC -CBGs  ranging from 111-178 on C  Fever -Was isolated and had temperature of 100.9 but has been afebrile since  -Patient had several blankets on him yesterday  -Continue monitor for recurrence and if patient has another fever we will panculture him and obtain a urinalysis, urine culture, blood cultures x2 and repeat chest x-ray -Patient does have a leukocytosis but likely in the setting of Steroid Demargination -Continue monitor and trend and follow-up temperature curve and repeat CBC in a.m.  Metabolic Acidosis -Mild. -CO2 is 21, AG is 9, and Chloride Level is 108 -Continue to Monitor and Trend and Repeat CMP in the AM  Leukocytosis -In the setting of steroid demargination and now reactive in the setting of Surgery  -Patient's WBC went from 8.2 -> 13.6 -> 11.3 -> 15.2 -Continue to monitor for signs and symptoms of infection -continue monitor and trend repeat CBC in a.m.  Overweight -Estimated body mass index is 27.08 kg/m as calculated from the following:   Height as of this encounter: 5\' 8"  (1.727 m).   Weight as of this encounter: 80.8 kg. -Weight Loss and Dietary Counseling given  Hx of Melanoma -Had Excision with Right Sentinel Lymph Node Bx done by Dr. Stark Klein in 2020   DVT prophylaxis: Enoxaparin  40 mg sq Daily  Code Status: FULL CODE Family Communication: Discussed with wife present at bedside  Disposition Plan: Pending further Neurosurgical Intervention and Evaluation; PT OT recommending home health PT but will re-evaluate; He is currently in the ICU on a Cardene gtt but this will be discontinued and Arterial Line will be pulled  Status is: Inpatient  Remains inpatient appropriate because:Unsafe d/c plan, IV treatments appropriate due to intensity of illness or inability to take PO and Inpatient level of care appropriate due to severity of illness   Dispo: The patient is from: Home              Anticipated d/c is to: TBD              Anticipated d/c date is: 1-2 days               Patient currently is not medically stable to d/c.  Consultants:   Neurosurgery Dr. Marcello Moores   Procedures: None  Antimicrobials:  Anti-infectives (From admission, onward)   Start     Dose/Rate Route Frequency Ordered Stop   08/05/20 1630  ceFAZolin (ANCEF) IVPB 1 g/50 mL premix        1 g 100 mL/hr over 30 Minutes Intravenous Every 8 hours 08/05/20 1346 08/06/20 1629     Subjective: Seen and examined at bedside and he was doing well today. No Pain or complaints. Awaiting to go to MRI this afternoon. Was placed on a Cardene gtt yesterday and has an Arterial Line but Neurosurgery planning on discontinuing both. No CP or SOB. No other concerns or complaints at this time.    Objective: Vitals:   08/06/20 0500 08/06/20 0600 08/06/20 0700 08/06/20 0800  BP: 117/62 118/61 128/64 92/62  Pulse: 64 67 75 70  Resp: 17 18 18  (!) 21  Temp:    98.3 F (36.8 C)  TempSrc:    Oral  SpO2: 93% 94% 94% 93%  Weight:      Height:        Intake/Output Summary (Last 24 hours) at 08/06/2020 0901 Last data filed at 08/06/2020 0800 Gross per 24 hour  Intake 3340.67 ml  Output 1585 ml  Net 1755.67 ml   Filed Weights   08/02/20 1854 08/05/20 1500  Weight: 77.6 kg 80.8 kg   Examination: Physical Exam:  Constitutional: WN/WD mildly overweight Caucasian male in NAD and appears calm and comfortable Eyes: Lids and conjunctivae normal, sclerae anicteric  ENMT: External Ears, Nose appear normal. Grossly normal hearing.  Neck: Appears normal, supple, no cervical masses, normal ROM, no appreciable thyromegaly; no JVD Respiratory: Diminished to auscultation bilaterally, no wheezing, rales, rhonchi or crackles. Normal respiratory effort and patient is not tachypenic. No accessory muscle use. Unlabored breathing  Cardiovascular: RRR, no murmurs / rubs / gallops. S1 and S2 auscultated. No extremity edema.  Abdomen: Soft, non-tender, Slightly distended. No masses palpated. No appreciable  hepatosplenomegaly. Bowel sounds positive.  GU: Deferred. Musculoskeletal: No clubbing / cyanosis of digits/nails. No joint deformity upper and lower extremities. Has an Arterial Line in the Left arm Skin: No rashes, lesions, ulcers on a limited skin evaluation but does have a surgical incision on the Right side of his head with a honeycomb dressing. No induration; Warm and dry.  Neurologic: CN 2-12 grossly intact with no focal deficits. Romberg sign and cerebellar reflexes not assessed.  Psychiatric: Normal judgment and insight. Alert and oriented x 3. Normal mood and appropriate affect.   Data Reviewed: I have personally  reviewed following labs and imaging studies  CBC: Recent Labs  Lab 08/02/20 1916 08/02/20 1916 08/03/20 0201 08/03/20 0201 08/04/20 0641 08/05/20 0445 08/05/20 0911 08/05/20 0925 08/06/20 0410  WBC 10.1  --  8.2  --  13.6* 11.3*  --   --  15.2*  NEUTROABS 7.0  --   --   --  12.1* 10.2*  --   --  13.8*  HGB 14.4   < > 13.9   < > 14.6 14.2 12.6* 12.9* 14.4  HCT 45.6   < > 42.5   < > 44.4 44.5 37.0* 38.0* 43.7  MCV 85.4  --  85.0  --  84.9 84.8  --   --  83.9  PLT 259  --  261  --  273 257  --   --  261   < > = values in this interval not displayed.   Basic Metabolic Panel: Recent Labs  Lab 08/02/20 1916 08/02/20 1916 08/03/20 0201 08/03/20 0201 08/04/20 0641 08/05/20 0445 08/05/20 0911 08/05/20 0925 08/06/20 0410  NA 141   < > 139   < > 140 140 137 138 138  K 3.9   < > 4.3   < > 4.3 4.2 3.9 4.1 3.9  CL 109  --  109  --  106 106  --   --  108  CO2 24  --  24  --  22 25  --   --  21*  GLUCOSE 107*  --  150*  --  131* 119*  --   --  169*  BUN 18  --  15  --  20 17  --   --  20  CREATININE 1.20  --  1.10  --  1.15 1.14  --   --  1.21  CALCIUM 9.0  --  8.6*  --  8.9 8.8*  --   --  8.3*  MG  --   --   --   --  2.1 2.2  --   --  2.1  PHOS  --   --   --   --  3.0 3.1  --   --  3.0   < > = values in this interval not displayed.   GFR: Estimated Creatinine  Clearance: 58.1 mL/min (by C-G formula based on SCr of 1.21 mg/dL). Liver Function Tests: Recent Labs  Lab 08/04/20 0641 08/05/20 0445 08/06/20 0410  AST 17 16 24   ALT 20 19 29   ALKPHOS 40 40 42  BILITOT 0.9 0.7 0.7  PROT 6.2* 6.0* 5.9*  ALBUMIN 3.5 3.4* 3.2*   No results for input(s): LIPASE, AMYLASE in the last 168 hours. No results for input(s): AMMONIA in the last 168 hours. Coagulation Profile: No results for input(s): INR, PROTIME in the last 168 hours. Cardiac Enzymes: No results for input(s): CKTOTAL, CKMB, CKMBINDEX, TROPONINI in the last 168 hours. BNP (last 3 results) No results for input(s): PROBNP in the last 8760 hours. HbA1C: Recent Labs    08/04/20 0641  HGBA1C 6.1*   CBG: Recent Labs  Lab 08/04/20 2132 08/05/20 0605 08/05/20 1752 08/05/20 2158 08/06/20 0718  GLUCAP 178* 111* 190* 135* 131*   Lipid Profile: No results for input(s): CHOL, HDL, LDLCALC, TRIG, CHOLHDL, LDLDIRECT in the last 72 hours. Thyroid Function Tests: No results for input(s): TSH, T4TOTAL, FREET4, T3FREE, THYROIDAB in the last 72 hours. Anemia Panel: No results for input(s): VITAMINB12, FOLATE, FERRITIN, TIBC, IRON, RETICCTPCT in the last 72 hours. Sepsis  Labs: No results for input(s): PROCALCITON, LATICACIDVEN in the last 168 hours.  Recent Results (from the past 240 hour(s))  SARS Coronavirus 2 by RT PCR (hospital order, performed in [facility] hospital lab) Nasopharyngeal Nasopharyngeal Swab     Status: None   Collection Time: 08/02/20  7:16 PM   Specimen: Nasopharyngeal Swab  Result Value Ref Range Status   SARS Coronavirus 2 NEGATIVE NEGATIVE Final    Comment: (NOTE) SARS-CoV-2 target nucleic acids are NOT DETECTED.  The SARS-CoV-2 RNA is generally detectable in upper and lower respiratory specimens during the acute phase of infection. The lowest concentration of SARS-CoV-2 viral copies this assay can detect is 250 copies / mL. A negative result does not preclude  SARS-CoV-2 infection and should not be used as the sole basis for treatment or other patient management decisions.  A negative result may occur with improper specimen collection / handling, submission of specimen other than nasopharyngeal swab, presence of viral mutation(s) within the areas targeted by this assay, and inadequate number of viral copies (<250 copies / mL). A negative result must be combined with clinical observations, patient history, and epidemiological information.  Fact Sheet for Patients:   StrictlyIdeas.no  Fact Sheet for Healthcare Providers: BankingDealers.co.za  This test is not yet approved or  cleared by the [place] FDA and has been authorized for detection and/or diagnosis of SARS-CoV-2 by FDA under an Emergency Use Authorization (EUA).  This EUA will remain in effect (meaning this test can be used) for the duration of the COVID-19 declaration under Section 564(b)(1) of the Act, 21 U.S.C. section 360bbb-3(b)(1), unless the authorization is terminated or revoked sooner.  Performed at [facility], [place] [address], [place], [place] [address]   Surgical pcr screen     Status: None   Collection Time: 08/04/20  9:54 PM   Specimen: Nasal Mucosa; Nasal Swab  Result Value Ref Range Status   MRSA, PCR NEGATIVE NEGATIVE Final   Staphylococcus aureus NEGATIVE NEGATIVE Final    Comment: (NOTE) The Xpert SA Assay (FDA approved for NASAL specimens in patients 19 years of age and older), is one component of a comprehensive surveillance program. It is not intended to diagnose infection nor to guide or monitor treatment. Performed at [facility], [place] [address] [place], [place] [address]      RN Pressure Injury Documentation:     Estimated body mass index is 27.08 kg/m as calculated from the following:   Height as of this encounter: 5\' 8"  (1.727 m).   Weight as of this encounter: 80.8 kg.   Malnutrition Type:      Malnutrition Characteristics:      Nutrition Interventions:     Radiology Studies: No results found. Scheduled Meds: . Chlorhexidine Gluconate Cloth  6 each Topical Daily  . dexamethasone (DECADRON) injection  4 mg Intravenous Q6H  . docusate sodium  100 mg Oral BID  . enoxaparin (LOVENOX) injection  40 mg Subcutaneous Q24H  . insulin aspart  0-9 Units Subcutaneous TID WC  . levETIRAcetam  500 mg Oral BID  . pantoprazole  40 mg Oral Daily  . senna  1 tablet Oral BID   Continuous Infusions: . sodium chloride 10 mL/hr at 08/06/20 0800  .  ceFAZolin (ANCEF) IV 1 g (08/05/20 2358)  . niCARDipine 10 mg/hr (08/06/20 0800)    LOS: 4 days   Kerney Elbe, DO [facility] PAGER is on [place]  If 7PM-7AM, please contact night-coverage [email or web address]

## 2020-08-06 NOTE — Progress Notes (Signed)
Physical Therapy Treatment Patient Details Name: Walter Rodriguez MRN: [id number] DOB: [birth year] Today's Date: 08/06/2020    History of Present Illness 64 year old male who for the last 6 weeks he has been having difficulty with walking being off balance and a shuffling gait.  He has also had some weakness in his left leg.  A few days ago he was walking in the woods when he fell and was unable to get up and called his family to come and get him.  After that episode he did decided he would go for evaluation and saw his PCP and had a CT scan of the head which revealed a brain tumor.  He was then referred for an MRI which shows brain tumor with surrounding edema and some midline shift. Pt underwent R crani with tumor resection on 08/05/2020.    PT Comments    Pt tolerates treatment well, with continued gait deviations although no physical assistance requirements during ambulation. Pt requires some assistance to steady during stair negotiation and with initial transfer. Pt will benefit from continued acute PT services to progress gait and mobility quality in an effort to reduce falls risk and restore independence. PT recommends HHPT upon discharge, no current DME needs.   Follow Up Recommendations  Home health PT (progressing to outpatient PT)     Equipment Recommendations  None recommended by PT    Recommendations for Other Services       Precautions / Restrictions Precautions Precautions: Fall Restrictions Weight Bearing Restrictions: No    Mobility  Bed Mobility Overal bed mobility: Modified Independent                Transfers Overall transfer level: Needs assistance Equipment used: None Transfers: Sit to/from Stand Sit to Stand: Min guard            Ambulation/Gait Ambulation/Gait assistance: Supervision Gait Distance (Feet): 300 Feet Assistive device: None Gait Pattern/deviations: Step-through pattern;Step-to pattern;Decreased stride length Gait velocity:  reduced Gait velocity interpretation: <1.8 ft/sec, indicate of risk for recurrent falls General Gait Details: pt initially with short shuffling gait, PT encourages increased stride length and gait speed which pt is able to perform. Pt ambulates with stiff UE and downward gaze but is able to correct with PT verbal cues   Stairs Stairs: Yes Stairs assistance: Min assist Stair Management: No rails Number of Stairs: 2     Wheelchair Mobility    Modified Rankin (Stroke Patients Only)       Balance Overall balance assessment: Needs assistance Sitting-balance support: No upper extremity supported;Feet supported Sitting balance-Leahy Scale: Good     Standing balance support: No upper extremity supported Standing balance-Leahy Scale: Fair                              Cognition Arousal/Alertness: Awake/alert Behavior During Therapy: WFL for tasks assessed/performed Overall Cognitive Status: No family/caregiver present to determine baseline cognitive functioning                                 General Comments: pt able to follow commands consistently, oriented x4      Exercises      General Comments General comments (skin integrity, edema, etc.): pt tachy into 130s with activity, recovers quickly once seated and resting      Pertinent Vitals/Pain Pain Assessment: No/denies pain    Home Living Family/patient  expects to be discharged to:: Private residence   Available Help at Discharge: Family;Available 24 hours/day Type of Home: House              Prior Function            PT Goals (current goals can now be found in the care plan section) Acute Rehab PT Goals Patient Stated Goal: to get back to walking and riding bike Progress towards PT goals: Progressing toward goals    Frequency    Min 3X/week      PT Plan Current plan remains appropriate    Co-evaluation              AM-PAC PT "6 Clicks" Mobility   Outcome  Measure  Help needed turning from your back to your side while in a flat bed without using bedrails?: None Help needed moving from lying on your back to sitting on the side of a flat bed without using bedrails?: None Help needed moving to and from a bed to a chair (including a wheelchair)?: A Little Help needed standing up from a chair using your arms (e.g., wheelchair or bedside chair)?: A Little Help needed to walk in hospital room?: None Help needed climbing 3-5 steps with a railing? : A Little 6 Click Score: 21    End of Session   Activity Tolerance: Patient tolerated treatment well Patient left: in chair;with call bell/phone within reach Nurse Communication: Mobility status PT Visit Diagnosis: Other abnormalities of gait and mobility (R26.89);Muscle weakness (generalized) (M62.81)     Time: 0600-4599 PT Time Calculation (min) (ACUTE ONLY): 25 min  Charges:  $Gait Training: 8-22 mins $Therapeutic Activity: 8-22 mins                     Zenaida Niece, PT, DPT Acute Rehabilitation Pager: [phone]    Zenaida Niece 08/06/2020, 2:46 PM

## 2020-08-06 NOTE — Progress Notes (Signed)
Postop day 1.  Overall patient doing well.  He is awake and alert.  Speech is mildly halting but otherwise fluent.  Motor examination revealed slight left-sided pronator drift otherwise motor exam intact.  Wound clean and dry.  Remains mildly hypotensive.  Exam stable otherwise.  Urine output good.  Postop MRI scan pending.  Overall progressing well following craniotomy and resection of tumor.  Check follow-up scan later today.  Possible discharge tomorrow.

## 2020-08-06 NOTE — Evaluation (Signed)
Speech Language Pathology Evaluation Patient Details Name: LAURENCE CROFFORD MRN: [id number] DOB: [birth year] Today's Date: 08/06/2020 Time: 2202-5427 SLP Time Calculation (min) (ACUTE ONLY): 20 min  Problem List:  Patient Active Problem List   Diagnosis Date Noted  . Ataxia 08/04/2020  . Pre-diabetes 08/04/2020  . Fever 08/04/2020  . Leukocytosis 08/04/2020  . Neoplasm of brain causing mass effect on adjacent structures ([place]) 08/02/2020  . Melanoma of trunk ([place]) 04/06/2019   Past Medical History:  Past Medical History:  Diagnosis Date  . Melanoma ([place] [facility])    back  . Open wound    back   Past Surgical History:  Past Surgical History:  Procedure Laterality Date  . MELANOMA EXCISION WITH SENTINEL LYMPH NODE BIOPSY N/A 04/01/2019   Procedure: WIDE LOCAL EXCISION OF BACK MELANOMA WITH RIGHT AXILLARY SENTINEL LYMPH NODE BIOPSY;  Surgeon: Stark Klein, MD;  Location: [place];  Service: General;  Laterality: N/A;  . SKIN SPLIT GRAFT N/A 04/10/2019   Procedure: SPLIT THICKNESS SKIN GRAFT FROM RIGHT  THIGH TO THE BACK;  Surgeon: Irene Limbo, MD;  Location: [facility];  Service: Plastics;  Laterality: N/A;   HPI:  Patient is a 64 y.o. male without chronic medical problems and who does not take any chronic medications. He presented a year ago and had melanoma surgically removed from upper central back. For 6 weeks prior to current hospital admission, patient reported having difficulty with walking, being off balance and with shuffling gait, weakness in left leg. A few days prior to admission, he was walking in woods, fell down and could not get up. He saw his PCP and a CT done at that time revealed a brain tumor with surrounding edema and midline shift. On 8/27, he underwent right craniotomy for tumor excision.   Assessment / Plan / Recommendation Clinical Impression  Patient participated in cognitive testing via the [facility] Mental Status exam) and received a score of 29/30  which is in the range of Normal cognitive functiong. Patient was fully oriented and did not exhibit any impairments of memory, reasoning, awareness, problem solving or attention. Wife reported that prior to the surgery yesterday, patient was exhibiting significant difficulty with short term memory but she has not noticed any cognitive issues at this time.    SLP Assessment  SLP Recommendation/Assessment: Patient does not need any further Speech Lanaguage Pathology Services SLP Visit Diagnosis: Cognitive communication deficit (R41.841)    Follow Up Recommendations  None    Frequency and Duration   N/A        SLP Evaluation Cognition  Overall Cognitive Status: Within Functional Limits for tasks assessed Arousal/Alertness: Awake/alert Orientation Level: Oriented X4 Attention: Selective Selective Attention: Appears intact Memory: Appears intact Executive Function: Reasoning;Self Monitoring Reasoning: Appears intact Self Monitoring: Appears intact Safety/Judgment: Appears intact       Comprehension  Auditory Comprehension Overall Auditory Comprehension: Appears within functional limits for tasks assessed    Expression Expression Primary Mode of Expression: Verbal Verbal Expression Overall Verbal Expression: Appears within functional limits for tasks assessed Initiation: No impairment   Oral / Motor  Oral Motor/Sensory Function Overall Oral Motor/Sensory Function: Within functional limits Motor Speech Overall Motor Speech: Appears within functional limits for tasks assessed   [place], MA, CCC-SLP Speech Therapy [facility]

## 2020-08-06 NOTE — Progress Notes (Signed)
Per MRI, repeat scan will most likely get done after 5pm today.

## 2020-08-07 LAB — COMPREHENSIVE METABOLIC PANEL
ALT: 31 U/L (ref 0–44)
AST: 22 U/L (ref 15–41)
Albumin: 2.8 g/dL — ABNORMAL LOW (ref 3.5–5.0)
Alkaline Phosphatase: 41 U/L (ref 38–126)
Anion gap: 7 (ref 5–15)
BUN: 26 mg/dL — ABNORMAL HIGH (ref 8–23)
CO2: 23 mmol/L (ref 22–32)
Calcium: 8.3 mg/dL — ABNORMAL LOW (ref 8.9–10.3)
Chloride: 107 mmol/L (ref 98–111)
Creatinine, Ser: 1.05 mg/dL (ref 0.61–1.24)
GFR calc Af Amer: >60 mL/min (ref >60)
GFR calc non Af Amer: >60 mL/min (ref >60)
Glucose, Bld: 135 mg/dL — ABNORMAL HIGH (ref 70–99)
Potassium: 4.4 mmol/L (ref 3.5–5.1)
Sodium: 137 mmol/L (ref 135–145)
Total Bilirubin: 0.8 mg/dL (ref 0.3–1.2)
Total Protein: 5.7 g/dL — ABNORMAL LOW (ref 6.5–8.1)

## 2020-08-07 LAB — CBC WITH DIFFERENTIAL/PLATELET
Abs Immature Granulocytes: 0.15 10*3/uL — ABNORMAL HIGH (ref 0.00–0.07)
Basophils Absolute: 0 10*3/uL (ref 0.0–0.1)
Basophils Relative: 0 %
Eosinophils Absolute: 0 10*3/uL (ref 0.0–0.5)
Eosinophils Relative: 0 %
HCT: 40.9 % (ref 39.0–52.0)
Hemoglobin: 13.4 g/dL (ref 13.0–17.0)
Immature Granulocytes: 1 %
Lymphocytes Relative: 6 %
Lymphs Abs: 0.7 10*3/uL (ref 0.7–4.0)
MCH: 27.1 pg (ref 26.0–34.0)
MCHC: 32.8 g/dL (ref 30.0–36.0)
MCV: 82.6 fL (ref 80.0–100.0)
Monocytes Absolute: 0.5 10*3/uL (ref 0.1–1.0)
Monocytes Relative: 4 %
Neutro Abs: 10.9 10*3/uL — ABNORMAL HIGH (ref 1.7–7.7)
Neutrophils Relative %: 89 %
Platelets: 230 10*3/uL (ref 150–400)
RBC: 4.95 MIL/uL (ref 4.22–5.81)
RDW: 13.7 % (ref 11.5–15.5)
WBC: 12.2 10*3/uL — ABNORMAL HIGH (ref 4.0–10.5)
nRBC: 0 % (ref 0.0–0.2)

## 2020-08-07 LAB — PHOSPHORUS: Phosphorus: 2.9 mg/dL (ref 2.5–4.6)

## 2020-08-07 LAB — GLUCOSE, CAPILLARY: Glucose-Capillary: 121 mg/dL — ABNORMAL HIGH (ref 70–99)

## 2020-08-07 LAB — MAGNESIUM: Magnesium: 2.2 mg/dL (ref 1.7–2.4)

## 2020-08-07 MED ORDER — DEXAMETHASONE 2 MG PO TABS: 2.0000 mg | ORAL_TABLET | Freq: Two times a day (BID) | ORAL | 0 refills | Status: DC

## 2020-08-07 MED ORDER — HYDROCODONE-ACETAMINOPHEN 5-325 MG PO TABS
1.0000 | ORAL_TABLET | Freq: Four times a day (QID) | ORAL | 0 refills | Status: DC | PRN
Start: 2020-08-07 — End: 2020-09-26

## 2020-08-07 MED ORDER — LEVETIRACETAM 500 MG PO TABS: 500.0000 mg | ORAL_TABLET | Freq: Two times a day (BID) | ORAL | 0 refills | Status: DC

## 2020-08-07 NOTE — Discharge Instructions (Signed)
Wound Care Keep incision covered and dry for two days.    Do not put any creams, lotions, or ointments on incision. Staples will be removed at your first post op appointment. Activity Walk each and every day, increasing distance each day. No lifting greater than 5-10 lbs. No driving for 2 weeks; may ride as a passenger locally. Diet Resume your normal diet.  Return to Work Will be discussed at your follow up appointment. Call Your Doctor If Any of These Occur Redness, drainage, or swelling at the wound.  Temperature greater than 101 degrees. Severe pain not relieved by pain medication. Incision starts to come apart. Follow Up Appt Call today for appointment in 1 week [phone]) or for problems.    Heart Healthy, Consistent Carbohydrate Nutrition Therapy   1)Never skip meals 2) Be careful with portions sizes, especially with carbohydrate-containing foods 3)Always eat a healthy protein/fat when eating/drinking a carbohydrate  A heart-healthy and consistent carbohydrate diet is recommended to manage heart disease and diabetes. To follow a heart-healthy and consistent carbohydrate diet, . Eat a balanced diet with whole grains, fruits and vegetables, and lean protein sources.  . Choose heart-healthy unsaturated fats. Limit saturated fats, trans fats, and cholesterol intake. Eat more plant-based or vegetarian meals using beans and soy foods for protein.  . Eat whole, unprocessed foods to limit the amount of sodium (salt) you eat.  . Choose a consistent amount of carbohydrate at each meal and snack. Limit refined carbohydrates especially sugar, sweets and sugar-sweetened beverages.  . If you drink alcohol, do so in moderation: one serving per day (women) and two servings per day (men). o One serving is equivalent to 12 ounces beer, 5 ounces wine, or 1.5 ounces distilled spirits  Tips Tips for Choosing Heart-Healthy Fats Choose lean protein and low-fat dairy foods to reduce saturated  fat intake. . Saturated fat is usually found in animal-based protein and is associated with certain health risks. Saturated fat is the biggest contributor to raise low-density lipoprotein (LDL) cholesterol levels. Research shows that limiting saturated fat lowers unhealthy cholesterol levels. Eat no more than 7% of your total calories each day from saturated fat. Ask your RDN to help you determine how much saturated fat is right for you. . There are many foods that do not contain large amounts of saturated fats. Swapping these foods to replace foods high in saturated fats will help you limit the saturated fat you eat and improve your cholesterol levels. You can also try eating more plant-based or vegetarian meals. Instead of. Try:  Whole milk, cheese, yogurt, and ice cream 1% or skim milk, low-fat cheese, non-fat yogurt, and low-fat ice cream  Fatty, marbled beef and pork Lean beef, pork, or venison  Poultry with skin Poultry without skin  Butter, stick margarine Reduced-fat, whipped, or liquid spreads  Coconut oil, palm oil Liquid vegetable oils: corn, canola, olive, soybean and safflower oils   Avoid foods that contain trans fats. . Trans fats increase levels of LDL-cholesterol. Hydrogenated fat in processed foods is the main source of trans fats in foods.  . Trans fats can be found in stick margarine, shortening, processed sweets, baked goods, some fried foods, and packaged foods made with hydrogenated oils. Avoid foods with "partially hydrogenated oil" on the ingredient list such as: cookies, pastries, baked goods, biscuits, crackers, microwave popcorn, and frozen dinners. Choose foods with heart healthy fats. . Polyunsaturated and monounsaturated fat are unsaturated fats that may help lower your blood cholesterol level when used in  place of saturated fat in your diet. . Ask your RDN about taking a dietary supplement with plant sterols and stanols to help lower your cholesterol level. Marland Kitchen Research  shows that substituting saturated fats with unsaturated fats is beneficial to cholesterol levels. Try these easy swaps: Instead of. Try:  Butter, stick margarine, or solid shortening Reduced-fat, whipped, or liquid spreads  Beef, pork, or poultry with skin Fish and seafood  Chips, crackers, snack foods Raw or unsalted nuts and seeds or nut butters Hummus with vegetables Avocado on toast  Coconut oil, palm oil Liquid vegetable oils: corn, canola, olive, soybean and safflower oils  Limit the amount of cholesterol you eat to less than 200 milligrams per day. . Cholesterol is a substance carried through the bloodstream via lipoproteins, which are known as "transporters" of fat. Some body functions need cholesterol to work properly, but too much cholesterol in the bloodstream can damage arteries and build up blood vessel linings (which can lead to heart attack and stroke). You should eat less than 200 milligrams cholesterol per day. Marland Kitchen People respond differently to eating cholesterol. There is no test available right now that can figure out which people will respond more to dietary cholesterol and which will respond less. For individuals with high intake of dietary cholesterol, different types of increase (none, small, moderate, large) in LDL-cholesterol levels are all possible.  . Food sources of cholesterol include egg yolks and organ meats such as liver, gizzards. Limit egg yolks to two to four per week and avoid organ meats like liver and gizzards to control cholesterol intake. Tips for Choosing Heart-Healthy Carbohydrates Consume a consistent amount of carbohydrate . It is important to eat foods with carbohydrates in moderation because they impact your blood glucose level. Carbohydrates can be found in many foods such as: . Grains (breads, crackers, rice, pasta, and cereals)  . Starchy Vegetables (potatoes, corn, and peas)  . Beans and legumes  . Milk, soy milk, and yogurt  . Fruit and fruit juice    . Sweets (cakes, cookies, ice cream, jam and jelly) . Your RDN will help you set a goal for how many carbohydrate servings to eat at your meals and snacks. For many adults, eating 3 to 5 servings of carbohydrate foods at each meal and 1 or 2 carbohydrate servings for each snack works well.  . Check your blood glucose level regularly. It can tell you if you need to adjust when you eat carbohydrates. . Choose foods rich in viscous (soluble) fiber . Viscous, or soluble, is found in the walls of plant cells. Viscous fiber is found only in plant-based foods. Eating foods with fiber helps to lower your unhealthy cholesterol and keep your blood glucose in range  . Rich sources of viscous fiber include vegetables (asparagus, Brussels sprouts, sweet potatoes, turnips) fruit (apricots, mangoes, oranges), legumes, and whole grains (barley, oats, and oat bran).  . As you increase your fiber intake gradually, also increase the amount of water you drink. This will help prevent constipation.  . If you have difficulty achieving this goal, ask your RDN about fiber laxatives. Choose fiber supplements made with viscous fibers such as psyllium seed husks or methylcellulose to help lower unhealthy cholesterol.  . Limit refined carbohydrates  . There are three types of carbohydrates: starches, sugar, and fiber. Some carbohydrates occur naturally in food, like the starches in rice or corn or the sugars in fruits and milk. Refined carbohydrates--foods with high amounts of simple sugars--can raise triglyceride levels.  High triglyceride levels are associated with coronary heart disease. . Some examples of refined carbohydrate foods are table sugar, sweets, and beverages sweetened with added sugar. Tips for Reducing Sodium (Salt) Although sodium is important for your body to function, too much sodium can be harmful for people with high blood pressure. As sodium and fluid buildup in your tissues and bloodstream, your blood  pressure increases. High blood pressure may cause damage to other organs and increase your risk for a stroke. Even if you take a pill for blood pressure or a water pill (diuretic) to remove fluid, it is still important to have less salt in your diet. Ask your doctor and RDN what amount of sodium is right for you. Marland Kitchen Avoid processed foods. Eat more fresh foods.  . Fresh fruits and vegetables are naturally low in sodium, as well as frozen vegetables and fruits that have no added juices or sauces.  . Fresh meats are lower in sodium than processed meats, such as bacon, sausage, and hotdogs. Read the nutrition label or ask your butcher to help you find a fresh meat that is low in sodium. . Eat less salt--at the table and when cooking.  . A single teaspoon of table salt has 2,300 mg of sodium.  . Leave the salt out of recipes for pasta, casseroles, and soups.  . Ask your RDN how to cook your favorite recipes without sodium . Be a Paramedic.  . Look for food packages that say "salt-free" or "sodium-free." These items contain less than 5 milligrams of sodium per serving.  Marland Kitchen "Very low-sodium" products contain less than 35 milligrams of sodium per serving.  Marland Kitchen "Low-sodium" products contain less than 140 milligrams of sodium per serving.  . Beware for "Unsalted" or "No Added Salt" products. These items may still be high in sodium. Check the nutrition label. . Add flavors to your food without adding sodium.  . Try lemon juice, lime juice, fruit juice or vinegar.  . Dry or fresh herbs add flavor. Try basil, bay leaf, dill, rosemary, parsley, sage, dry mustard, nutmeg, thyme, and paprika.  . Pepper, red pepper flakes, and cayenne pepper can add spice t your meals without adding sodium. Hot sauce contains sodium, but if you use just a drop or two, it will not add up to much.  Sharyn Lull a sodium-free seasoning blend or make your own at home. Additional Lifestyle Tips Achieve and maintain a healthy weight. . Talk  with your RDN or your doctor about what is a healthy weight for you. . Set goals to reach and maintain that weight.  . To lose weight, reduce your calorie intake along with increasing your physical activity. A weight loss of 10 to 15 pounds could reduce LDL-cholesterol by 5 milligrams per deciliter. Participate in physical activity. . Talk with your health care team to find out what types of physical activity are best for you. Set a plan to get about 30 minutes of exercise on most days.  Foods Recommended Food Group Foods Recommended  Grains Whole grain breads and cereals, including whole wheat, barley, rye, buckwheat, corn, teff, quinoa, millet, amaranth, brown or wild rice, sorghum, and oats Pasta, especially whole wheat or other whole grain types  [employer], quinoa or wild rice Whole grain crackers, bread, rolls, pitas Home-made bread with reduced-sodium baking soda  Protein Foods Lean cuts of beef and pork (loin, leg, round, extra lean hamburger)  Skinless Cytogeneticist and other wild game Dried beans and  peas Nuts and nut butters Meat alternatives made with soy or textured vegetable protein  Egg whites or egg substitute Cold cuts made with lean meat or soy protein  Dairy Nonfat (skim), low-fat, or 1%-fat milk  Nonfat or low-fat yogurt or cottage cheese Fat-free and low-fat cheese  Vegetables Fresh, frozen, or canned vegetables without added fat or salt   Fruits Fresh, frozen, canned, or dried fruit   Oils Unsaturated oils (corn, olive, peanut, soy, sunflower, canola)  Soft or liquid margarines and vegetable oil spreads  Salad dressings Seeds and nuts  Avocado   Foods Not Recommended Food Group Foods Not Recommended  Grains Breads or crackers topped with salt Cereals (hot or cold) with more than 300 mg sodium per serving Biscuits, cornbread, and other "quick" breads prepared with baking soda Bread crumbs or stuffing mix from a store High-fat bakery products, such as  doughnuts, biscuits, croissants, danish pastries, pies, cookies Instant cooking foods to which you add hot water and stir--potatoes, noodles, rice, etc. Packaged starchy foods--seasoned noodle or rice dishes, stuffing mix, macaroni and cheese dinner Snacks made with partially hydrogenated oils, including chips, cheese puffs, snack mixes, regular crackers, butter-flavored popcorn  Protein Foods Higher-fat cuts of meats (ribs, t-bone steak, regular hamburger) Bacon, sausage, or hot dogs Cold cuts, such as salami or bologna, deli meats, cured meats, corned beef Organ meats (liver, brains, gizzards, sweetbreads) Poultry with skin Fried or smoked meat, poultry, and fish Whole eggs and egg yolks (more than 2-4 per week) Salted legumes, nuts, seeds, or nut/seed butters Meat alternatives with high levels of sodium (>300 mg per serving) or saturated fat (>5 g per serving)  Dairy Whole milk,?2% fat milk, buttermilk Whole milk yogurt or ice cream Cream Half-&-half Cream cheese Sour cream Cheese  Vegetables Canned or frozen vegetables with salt, fresh vegetables prepared with salt, butter, cheese, or cream sauce Fried vegetables Pickled vegetables such as olives, pickles, or sauerkraut  Fruits Fried fruits Fruits served with butter or cream  Oils Butter, stick margarine, shortening Partially hydrogenated oils or trans fats Tropical oils (coconut, palm, palm kernel oils)  Other Candy, sugar sweetened soft drinks and desserts Salt, sea salt, garlic salt, and seasoning mixes containing salt Bouillon cubes Ketchup, barbecue sauce, Worcestershire sauce, soy sauce, teriyaki sauce Miso Salsa Pickles, olives, relish   Heart Healthy Consistent Carbohydrate Vegetarian (Lacto-Ovo) Sample 1-Day Menu  Breakfast 1 cup oatmeal, cooked (2 carbohydrate servings)   cup blueberries (1 carbohydrate serving)  11 almonds, without salt  1 cup 1% milk (1 carbohydrate serving)  1 cup coffee  Morning Snack 1  cup fat-free plain yogurt (1 carbohydrate serving)  Lunch 1 whole wheat bun (1 carbohydrate servings)  1 black bean burger (1 carbohydrate servings)  1 slice cheddar cheese, low sodium  2 slices tomatoes  2 leaves lettuce  1 teaspoon mustard  1 small pear (1 carbohydrate servings)  1 cup green tea, unsweetened  Afternoon Snack 1/3 cup trail mix with nuts, seeds, and raisins, without salt (1 carbohydrate servinga)  Evening Meal  cup meatless chicken  2/3 cup brown rice, cooked (2 carbohydrate servings)  1 cup broccoli, cooked (2/3 carbohydrate serving)   cup carrots, cooked (1/3 carbohydrate serving)  2 teaspoons olive oil  1 teaspoon balsamic vinegar  1 whole wheat dinner roll (1 carbohydrate serving)  1 teaspoon margarine, soft, tub  1 cup 1% milk (1 carbohydrate serving)  Evening Snack 1 extra small banana (1 carbohydrate serving)  1 tablespoon peanut butter   Heart  Healthy Consistent Carbohydrate Vegan Sample 1-Day Menu  Breakfast 1 cup oatmeal, cooked (2 carbohydrate servings)   cup blueberries (1 carbohydrate serving)  11 almonds, without salt  1 cup soymilk fortified with calcium, vitamin B12, and vitamin D  1 cup coffee  Morning Snack 6 ounces soy yogurt (1 carbohydrate servings)  Lunch 1 whole wheat bun(1 carbohydrate servings)  1 black bean burger (1 carbohydrate serving)  2 slices tomatoes  2 leaves lettuce  1 teaspoon mustard  1 small pear (1 carbohydrate servings)  1 cup green tea, unsweetened  Afternoon Snack 1/3 cup trail mix with nuts, seeds, and raisins, without salt (1 carbohydrate servings)  Evening Meal  cup meatless chicken  2/3 cup brown rice, cooked (2 carbohydrate servings)  1 cup broccoli, cooked (2/3 carbohydrate serving)   cup carrots, cooked (1/3 carbohydrate serving)  2 teaspoons olive oil  1 teaspoon balsamic vinegar  1 whole wheat dinner roll (1 carbohydrate serving)  1 teaspoon margarine, soft, tub  1 cup soymilk fortified with  calcium, vitamin B12, and vitamin D  Evening Snack 1 extra small banana (1 carbohydrate serving)  1 tablespoon peanut butter    Heart Healthy Consistent Carbohydrate Sample 1-Day Menu  Breakfast 1 cup cooked oatmeal (2 carbohydrate servings)  3/4 cup blueberries (1 carbohydrate serving)  1 ounce almonds  1 cup skim milk (1 carbohydrate serving)  1 cup coffee  Morning Snack 1 cup sugar-free nonfat yogurt (1 carbohydrate serving)  Lunch 2 slices whole-wheat bread (2 carbohydrate servings)  2 ounces lean [place] breast  1 ounce low-fat Swiss cheese  1 teaspoon mustard  1 slice tomato  1 lettuce leaf  1 small pear (1 carbohydrate serving)  1 cup skim milk (1 carbohydrate serving)  Afternoon Snack 1 ounce trail mix with unsalted nuts, seeds, and raisins (1 carbohydrate serving)  Evening Meal 3 ounces salmon  2/3 cup cooked brown rice (2 carbohydrate servings)  1 teaspoon soft margarine  1 cup cooked broccoli with 1/2 cup cooked carrots (1 carbohydrate serving  Carrots, cooked, boiled, drained, without salt  1 cup lettuce  1 teaspoon olive oil with vinegar for dressing  1 small whole grain roll (1 carbohydrate serving)  1 teaspoon soft margarine  1 cup unsweetened tea  Evening Snack 1 extra-small banana (1 carbohydrate serving)  Copyright 2020  [employer]. All rights reserved.

## 2020-08-07 NOTE — Progress Notes (Signed)
PROGRESS NOTE    Walter Rodriguez  JKK:[id number] DOB: 05-Jan-1956 DOA: 08/02/2020 PCP: [name], MD   Brief Narrative:  HPI per Dr. Harrold Donath on 08/02/20  Walter Rodriguez is a 64 y.o. male with no chronic medical problems that he does not take any chronic medications.  He was diagnosed with melanoma a year ago and had a melanoma surgically removed from his upper central back a year ago.  For the last 6 weeks he has been having difficulty with walking being off balance and a shuffling gait.  He has also had some weakness in his left leg.  A few days ago he was walking in the woods when he fell and was unable to get up and called his family to come and get him.  After that episode he did decided he would go for evaluation and saw his PCP and had a CT scan of the head which revealed a brain tumor.  He was then referred for an MRI which shows brain tumor with surrounding edema and some midline shift.  He was started on Decadron by his outpatient physician yesterday due to the edema around the mass in his brain on CT scan.  States he has not had any fever, chills, cough, chest pain, palpitations, urinary hesitancy, frequency or incontinence.  He has not had any abdominal pain, nausea, vomiting, diarrhea.  ED Course:   ER provider consulted neurosurgery who requested a CT of the chest abdomen and pelvis be performed and for medicine to admit patient for further work-up.  Neurosurgery stated they would prescribe and manage the steroids and will start patient on Keppra.  **Interim History He was started on steroids and Keppra.  Neurosurgery evaluating and took the patient for a Right craniotomy for excision of the Brain Tumor today. During the hospitalization he had a temperature of 100.9 but he states that he was feeling hot and had a lot of covers and the temperature change in the room.  Fever was isolated he has had no further recurrence.  He does have elevated WBC but likely in the setting of  steroid margination.  If he continued to spike a temperature but we will panculture him and possibly start empiric antibiotics.  Yesterday Post-operatively he was seen in the PACU and had no pain or complaints.   08/06/20 He is now in the ICU and Neurosurgery is planning on repeating MRI of the Brain w/wo Contrast today. WBC remains elevated but he is on Steroids. Continues to have an Arterial Line but will be discontinued today. Have PT/OT re-evaluate.   08/07/20 He is doing much better today. He is being discharged home by the Neurosurgery Team now. MRI done showed "Gross total resection of right parietal mass lesion. There remains extensive vasogenic edema. 9 mm midline shift to the left is mildly improved. No acute infarct. 5 mm enhancing lesion left parietal bone suspicious for metastatic disease." Patient has an appointment with Dr. 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 He is stable from a Medical Perspective to D/C home.   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 -Neurosurgery was consulted  in the emergency room and they requested medicine admit patient. -CT of the Head showed "Mass at the junction of the periphery of the frontal and parietal lobes the left measuring 4.5 x 4.0 cm. Extensive surrounding vasogenic edema. 1.1 cm of midline shift the lateral and third ventricles to the left. Fourth ventricle midline. No hemorrhage or acute infarct. Mucosal thickening in several ethmoid air cells. Probable cerumen in each external auditory canal." -MRI Brain showed "Large, large, 49 mm enhancing mass in  the right hemisphere with extensive regional vasogenic edema, corresponding to the head CT finding yesterday. I favor the masses intra-axial, with top differential considerations of solitary Metastasis (see #3) versus High-grade primary tumor. An extra-axial Meningioma is felt less likely. Intracranial mass effect with leftward midline shift up to 11 mm. Mild trapping of the left lateral ventricle with transependymal edema. Recommend Neurosurgery consultation if not already done. Small 6 mm enhancing lesion in the left parietal bone is indeterminate, but suspicious for a small skull metastasis if the lesion in #1 is determined to be metastatic." -Neurosurgery requested a CT of the chest abdomen and pelvis be performed to look for other lesions that may be present; CT Chest/Abd/Pelvis showed "No evidence of recurrent or metastatic disease within the chest, abdomen, or pelvis.  Aortic atherosclerosis." -Neurosurgery has ordered Decadron and Keppra; currently getting 4 mg IV every 6 schedule of the dexamethasone and he is currently on 500 mg p.o. twice daily of the levetiracetam -Continue with supportive care and continue antiemetics with Zofran 4 mg p.o./IV every 6 as needed for nausea -Neurosurgery has ordered a CT of the head without contrast for staging and thin cuts; Showed "BrainLAB protocol for operative guidance. 4.9 cm right peripheral parietal hyperdense mass with extensive regional vasogenic edema. Mass effect with left-to-right midline shift of 12 mm." -We will add PPI for the patient given concern for his high-dose steroids and for GI prophylaxis. -May Discuss with Neuro-Oncology Dr. [name] but patient has an appointment scheduled with Medical Oncology and they will discuss and get Radiation involved for his Left Pariatel Bone Lesion -Neurosurgery recommending craniotomy for resectionand this was done 08/05/20 and he is POD1 today; He received perioperative Abx with IV Cefazolin  -Neurosurgery  planning on repeating an MRI Brain w/wo Contrast and it showed "Gross total resection of right parietal mass lesion. There remains extensive vasogenic edema. 9 mm midline shift to the left is mildly improved. No acute infarct. 5 mm enhancing lesion left parietal bone suspicious for metastatic disease." -Arterial Line was placed yesterday to be removed today and Cardene gtt was started post-operatively also to be discontinued as well -He is medically stable to D/C home and Neurosurgery has Discharged him   Ataxia -Patient replaced on fall precautions and likely from above.   -Consulted PT/OT and they are recommending Home Health PT/OT but may change depending on the Surgery -Will Reconsult PT/OT for evaluation and they recommend Home Health PT and will consult TOC for assistance   Hyperglycemia in the setting of Steroid Demargination -Patient's Blood Sugars ranging from 107-150 on daily BMP/CMP's; Blood sugar was 169 this AM  -Check HbA1c and was 6.1 -Continue to Monitor Blood Sugars carefully and will place on Sensitive Novolog SSI AC -CBGs ranging from 111-178 on CMP; Repeat this AM was 135  Fever -Was isolated and had temperature of 100.9 but has been afebrile since  -Patient had several blankets on him yesterday  -Continue monitor for recurrence and if patient has another fever we will  panculture him and obtain a urinalysis, urine culture, blood cultures x2 and repeat chest x-ray -Patient does have a leukocytosis but likely in the setting of Steroid Demargination -Continue monitor and trend and follow-up temperature curve and repeat CBC in a.m.  Metabolic Acidosis -Mild. -CO2 was 21, AG was 9, and Chloride Level was 108; Now CO2 is 23, AG is 7, and Chloride Level is 107 -Continue to Monitor and Trend and Repeat CMP in the AM  Leukocytosis -In the setting of steroid demargination and now reactive in the setting of Surgery  -Patient's WBC went from 8.2 -> 13.6 -> 11.3 -> 15.2 ->  12.2 -Continue to monitor for signs and symptoms of infection -continue monitor and trend repeat CBC in a.m.  Overweight -Estimated body mass index is 27.08 kg/m as calculated from the following:   Height as of this encounter: 5\' 8"  (1.727 m).   Weight as of this encounter: 80.8 kg. -Weight Loss and Dietary Counseling given  Hx of Melanoma -Had Excision with Right Sentinel Lymph Node Bx done by Dr. Stark Klein in 2020   DVT prophylaxis: Enoxaparin 40 mg sq Daily  Code Status: FULL CODE Family Communication: Discussed with sister at bedside  Disposition Plan: D/C today as Neurosurgery has already discharged him  Status is: Inpatient  Remains inpatient appropriate because:Unsafe d/c plan, IV treatments appropriate due to intensity of illness or inability to take PO and Inpatient level of care appropriate due to severity of illness   Dispo: The patient is from: Home              Anticipated d/c is to: Home              Anticipated d/c date is: Today 08/07/20              Patient currently is medically stable to d/c.  Consultants:   Neurosurgery Dr. Marcello Moores   Procedures: None  Antimicrobials:  Anti-infectives (From admission, onward)   Start     Dose/Rate Route Frequency Ordered Stop   08/05/20 1630  ceFAZolin (ANCEF) IVPB 1 g/50 mL premix        1 g 100 mL/hr over 30 Minutes Intravenous Every 8 hours 08/05/20 1346 08/06/20 1054     Subjective: Seen and examined at bedside and he is doing extremely well. Happy that the whole tumor was grossly removed. No CP or SOB. No headache or blurred vision. No complaints and ready to go home.   Objective: Vitals:   08/07/20 0600 08/07/20 0700 08/07/20 0800 08/07/20 0900  BP: 127/65 (!) 138/93  (!) 147/80  Pulse: (!) 52 (!) 59  63  Resp: 15 18  14   Temp:   98.3 F (36.8 C)   TempSrc:   Oral   SpO2: 95% 97%  97%  Weight:      Height:        Intake/Output Summary (Last 24 hours) at 08/07/2020 1124 Last data filed at 08/06/2020  2200 Gross per 24 hour  Intake 586.16 ml  Output --  Net 586.16 ml   Filed Weights   08/02/20 1854 08/05/20 1500  Weight: 77.6 kg 80.8 kg   Examination: Physical Exam:  Constitutional: WN/WD overweight Caucasian male in NAD and appears calm and comfortable Eyes: Lids and conjunctivae normal, sclerae anicteric  ENMT: External Ears, Nose appear normal. Grossly normal hearing.  Neck: Appears normal, supple, no cervical masses, normal ROM, no appreciable thyromegaly; no JVD Respiratory: Clear to auscultation bilaterally, no wheezing, rales, rhonchi or  crackles. Normal respiratory effort and patient is not tachypenic. No accessory muscle use. Unlabored breathing  Cardiovascular: RRR, no murmurs / rubs / gallops. S1 and S2 auscultated. No extremity edema.  Abdomen: Soft, non-tender,Distended 2/2 body habitus. Bowel sounds positive.  GU: Deferred. Musculoskeletal: No clubbing / cyanosis of digits/nails. No joint deformity upper and lower extremities.  Skin: No rashes, lesions, ulcers on a limited skin evaluation. No induration; Warm and dry.  Neurologic: CN 2-12 grossly intact with no focal deficits. Romberg sign and cerebellar reflexes not assessed.  Psychiatric: Normal judgment and insight. Alert and oriented x 3. Normal mood and appropriate affect.   Data Reviewed: I have personally reviewed following labs and imaging studies  CBC: Recent Labs  Lab 08/02/20 1916 08/02/20 1916 08/03/20 0201 08/03/20 0201 08/04/20 5631 08/04/20 4970 08/05/20 0445 08/05/20 0911 08/05/20 0925 08/06/20 0410 08/07/20 0429  WBC 10.1   < > 8.2  --  13.6*  --  11.3*  --   --  15.2* 12.2*  NEUTROABS 7.0  --   --   --  12.1*  --  10.2*  --   --  13.8* 10.9*  HGB 14.4   < > 13.9   < > 14.6   < > 14.2 12.6* 12.9* 14.4 13.4  HCT 45.6   < > 42.5   < > 44.4   < > 44.5 37.0* 38.0* 43.7 40.9  MCV 85.4   < > 85.0  --  84.9  --  84.8  --   --  83.9 82.6  PLT 259   < > 261  --  273  --  257  --   --  261 230    < > = values in this interval not displayed.   Basic Metabolic Panel: Recent Labs  Lab 08/03/20 0201 08/03/20 0201 08/04/20 2637 08/04/20 8588 08/05/20 0445 08/05/20 0911 08/05/20 0925 08/06/20 0410 08/07/20 0429  NA 139   < > 140   < > 140 137 138 138 137  K 4.3   < > 4.3   < > 4.2 3.9 4.1 3.9 4.4  CL 109  --  106  --  106  --   --  108 107  CO2 24  --  22  --  25  --   --  21* 23  GLUCOSE 150*  --  131*  --  119*  --   --  169* 135*  BUN 15  --  20  --  17  --   --  20 26*  CREATININE 1.10  --  1.15  --  1.14  --   --  1.21 1.05  CALCIUM 8.6*  --  8.9  --  8.8*  --   --  8.3* 8.3*  MG  --   --  2.1  --  2.2  --   --  2.1 2.2  PHOS  --   --  3.0  --  3.1  --   --  3.0 2.9   < > = values in this interval not displayed.   GFR: Estimated Creatinine Clearance: 67 mL/min (by C-G formula based on SCr of 1.05 mg/dL). Liver Function Tests: Recent Labs  Lab 08/04/20 0641 08/05/20 0445 08/06/20 0410 08/07/20 0429  AST 17 16 24 22   ALT 20 19 29 31   ALKPHOS 40 40 42 41  BILITOT 0.9 0.7 0.7 0.8  PROT 6.2* 6.0* 5.9* 5.7*  ALBUMIN 3.5 3.4* 3.2* 2.8*   No  results for input(s): LIPASE, AMYLASE in the last 168 hours. No results for input(s): AMMONIA in the last 168 hours. Coagulation Profile: No results for input(s): INR, PROTIME in the last 168 hours. Cardiac Enzymes: No results for input(s): CKTOTAL, CKMB, CKMBINDEX, TROPONINI in the last 168 hours. BNP (last 3 results) No results for input(s): PROBNP in the last 8760 hours. HbA1C: No results for input(s): HGBA1C in the last 72 hours. CBG: Recent Labs  Lab 08/06/20 0718 08/06/20 1106 08/06/20 1618 08/06/20 2220 08/07/20 0717  GLUCAP 131* 133* 139* 134* 121*   Lipid Profile: No results for input(s): CHOL, HDL, LDLCALC, TRIG, CHOLHDL, LDLDIRECT in the last 72 hours. Thyroid Function Tests: No results for input(s): TSH, T4TOTAL, FREET4, T3FREE, THYROIDAB in the last 72 hours. Anemia Panel: No results for input(s):  VITAMINB12, FOLATE, FERRITIN, TIBC, IRON, RETICCTPCT in the last 72 hours. Sepsis Labs: No results for input(s): PROCALCITON, LATICACIDVEN in the last 168 hours.  Recent Results (from the past 240 hour(s))  SARS Coronavirus 2 by RT PCR (hospital order, performed in [facility] hospital lab) Nasopharyngeal Nasopharyngeal Swab     Status: None   Collection Time: 08/02/20  7:16 PM   Specimen: Nasopharyngeal Swab  Result Value Ref Range Status   SARS Coronavirus 2 NEGATIVE NEGATIVE Final    Comment: (NOTE) SARS-CoV-2 target nucleic acids are NOT DETECTED.  The SARS-CoV-2 RNA is generally detectable in upper and lower respiratory specimens during the acute phase of infection. The lowest concentration of SARS-CoV-2 viral copies this assay can detect is 250 copies / mL. A negative result does not preclude SARS-CoV-2 infection and should not be used as the sole basis for treatment or other patient management decisions.  A negative result may occur with improper specimen collection / handling, submission of specimen other than nasopharyngeal swab, presence of viral mutation(s) within the areas targeted by this assay, and inadequate number of viral copies (<250 copies / mL). A negative result must be combined with clinical observations, patient history, and epidemiological information.  Fact Sheet for Patients:   StrictlyIdeas.no  Fact Sheet for Healthcare Providers: BankingDealers.co.za  This test is not yet approved or  cleared by the [place] FDA and has been authorized for detection and/or diagnosis of SARS-CoV-2 by FDA under an Emergency Use Authorization (EUA).  This EUA will remain in effect (meaning this test can be used) for the duration of the COVID-19 declaration under Section 564(b)(1) of the Act, 21 U.S.C. section 360bbb-3(b)(1), unless the authorization is terminated or revoked sooner.  Performed at [facility],  [place] [address], [place], [place] [address]   Surgical pcr screen     Status: None   Collection Time: 08/04/20  9:54 PM   Specimen: Nasal Mucosa; Nasal Swab  Result Value Ref Range Status   MRSA, PCR NEGATIVE NEGATIVE Final   Staphylococcus aureus NEGATIVE NEGATIVE Final    Comment: (NOTE) The Xpert SA Assay (FDA approved for NASAL specimens in patients 72 years of age and older), is one component of a comprehensive surveillance program. It is not intended to diagnose infection nor to guide or monitor treatment. Performed at [facility], [place] [address], [place], [place] [address]      RN Pressure Injury Documentation:     Estimated body mass index is 27.08 kg/m as calculated from the following:   Height as of this encounter: 5\' 8"  (1.727 m).   Weight as of this encounter: 80.8 kg.  Malnutrition Type:  Malnutrition Characteristics:      Nutrition Interventions:     Radiology Studies: MR BRAIN W WO CONTRAST  Result Date: 08/06/2020 CLINICAL DATA:  Craniotomy for tumor resection 08/05/2020. History of melanoma. EXAM: MRI HEAD WITHOUT AND WITH CONTRAST TECHNIQUE: Multiplanar, multiecho pulse sequences of the brain and surrounding structures were obtained without and with intravenous contrast. CONTRAST:  41mL GADAVIST GADOBUTROL 1 MMOL/ML IV SOLN COMPARISON:  MRI head with contrast 08/02/2020 FINDINGS: Brain: Right parietal craniotomy and resection of right parietal mass lesion. Surgical cavity contains fluid and blood. No acute infarct. No residual enhancing mass lesion. The mass did enhance on the preoperative study. Extensive vasogenic edema throughout the surrounding white matter similar to the prior study. Mass-effect and 9 mm of midline shift slightly improved. No other enhancing lesion in the brain is identified. Vascular: Normal arterial flow voids Skull and upper cervical spine: 5 mm enhancing lesion in the left parietal bone unchanged from the prior study. There  is erosion of the inner and outer table. Probable metastatic deposit. Sinuses/Orbits: Paranasal sinuses clear.  Negative orbit Other: None IMPRESSION: Gross total resection of right parietal mass lesion. There remains extensive vasogenic edema. 9 mm midline shift to the left is mildly improved. No acute infarct 5 mm enhancing lesion left parietal bone suspicious for metastatic disease. Electronically Signed   By: Franchot Gallo M.D.   On: 08/06/2020 22:00   Scheduled Meds: . Chlorhexidine Gluconate Cloth  6 each Topical Daily  . dexamethasone (DECADRON) injection  4 mg Intravenous Q6H  . docusate sodium  100 mg Oral BID  . enoxaparin (LOVENOX) injection  40 mg Subcutaneous Q24H  . insulin aspart  0-9 Units Subcutaneous TID WC  . levETIRAcetam  500 mg Oral BID  . pantoprazole  40 mg Oral Daily  . senna  1 tablet Oral BID   Continuous Infusions: . sodium chloride Stopped (08/06/20 1024)    LOS: 5 days   Kerney Elbe, DO [facility] PAGER is on [place]  If 7PM-7AM, please contact night-coverage [email or web address]

## 2020-08-07 NOTE — Discharge Summary (Signed)
Physician Discharge Summary     Providing Compassionate, Quality Care - Together   Patient ID: Walter Rodriguez MRN: [id number] DOB/AGE: 1956-05-27 64 y.o.  Admit date: 08/02/2020 Discharge date: 08/07/2020  Admission Diagnoses: Neoplasm of brain causing mass effect on adjacent structures   Discharge Diagnoses:  Principal Problem:   Neoplasm of brain causing mass effect on adjacent structures [facility]) Active Problems:   Ataxia   Pre-diabetes   Fever   Leukocytosis   Discharged Condition: good  Hospital Course: Patient underwent a craniotomy for excision of 4.5 cm enhancing mass in his right parietal lobe by Dr. Marcello Moores on 08/05/2020. He was admitted to 4N30 following recovery from anesthesia in the PACU. His postoperative course has been uncomplicated. His follow up scan from the evening of 08/06/2020 showed gross total resection of right parietal mass lesion. The midline shift to the left was mildly improved. No signs of an acute infarct. He has worked with both physical and occupational therapies who are recommending [facility]/OT at discharge. He is ambulating with standby assist and without difficulty. He is tolerating a normal diet. He is not having any bowel or bladder dysfunction. His pain is well-controlled with oral pain medication. He is ready for discharge home.   Consults: rehabilitation medicine  Significant Diagnostic Studies: radiology: CT HEAD WO CONTRAST  Result Date: 08/03/2020 CLINICAL DATA:  Brain tumor.  BrainLAB protocol. EXAM: CT HEAD WITHOUT CONTRAST TECHNIQUE: Contiguous axial images were obtained from the base of the skull through the vertex without intravenous contrast. COMPARISON:  Head CT 08/01/2020.  MRI yesterday. FINDINGS: Brain: BrainLAB protocol for operative guidance in localization. Right peripheral parietal hyperdense mass measuring up to 4.9 cm in diameter. Extensive regional vasogenic edema. Mass effect with left-to-right midline shift of 12 mm. No  change in ventricular size. Vascular: No abnormal vascular finding. Skull: Negative Sinuses/Orbits: Clear/normal Other: None IMPRESSION: BrainLAB protocol for operative guidance. 4.9 cm right peripheral parietal hyperdense mass with extensive regional vasogenic edema. Mass effect with left-to-right midline shift of 12 mm. Electronically Signed   By: Nelson Chimes M.D.   On: 08/03/2020 19:29   MR BRAIN W WO CONTRAST  Result Date: 08/06/2020 CLINICAL DATA:  Craniotomy for tumor resection 08/05/2020. History of melanoma. EXAM: MRI HEAD WITHOUT AND WITH CONTRAST TECHNIQUE: Multiplanar, multiecho pulse sequences of the brain and surrounding structures were obtained without and with intravenous contrast. CONTRAST:  55mL GADAVIST GADOBUTROL 1 MMOL/ML IV SOLN COMPARISON:  MRI head with contrast 08/02/2020 FINDINGS: Brain: Right parietal craniotomy and resection of right parietal mass lesion. Surgical cavity contains fluid and blood. No acute infarct. No residual enhancing mass lesion. The mass did enhance on the preoperative study. Extensive vasogenic edema throughout the surrounding white matter similar to the prior study. Mass-effect and 9 mm of midline shift slightly improved. No other enhancing lesion in the brain is identified. Vascular: Normal arterial flow voids Skull and upper cervical spine: 5 mm enhancing lesion in the left parietal bone unchanged from the prior study. There is erosion of the inner and outer table. Probable metastatic deposit. Sinuses/Orbits: Paranasal sinuses clear.  Negative orbit Other: None IMPRESSION: Gross total resection of right parietal mass lesion. There remains extensive vasogenic edema. 9 mm midline shift to the left is mildly improved. No acute infarct 5 mm enhancing lesion left parietal bone suspicious for metastatic disease. Electronically Signed   By: Franchot Gallo M.D.   On: 08/06/2020 22:00     Treatments: surgery:  1.  Right craniotomy for excision  of brain tumor 2.   Use of computer assisted neuro-navigation   Discharge Exam: Blood pressure (!) 147/80, pulse 63, temperature 98.3 F (36.8 C), temperature source Oral, resp. rate 14, height 5\' 8"  (1.727 m), weight 80.8 kg, SpO2 97 %.   Alert and oriented x 4 PERRLA Speech clear CN II-XII grossly intact MAE, Strength and sensation intact Incision is covered with Honeycomb dressing; Skin layer closed with staples; Dressing is clean, dry, and intact   Disposition: Discharge disposition: 01-Home or Self Care       Discharge Instructions    Face-to-face encounter (required for Medicare/Medicaid patients)   Complete by: As directed    I Patricia Nettle certify that this patient is under my care and that I, or a nurse practitioner or physician's assistant working with me, had a face-to-face encounter that meets the physician face-to-face encounter requirements with this patient on 08/07/2020. The encounter with the patient was in whole, or in part for the following medical condition(s) which is the primary reason for home health care (List medical condition): s/p right craniotomy for excision of brain tumor   The encounter with the patient was in whole, or in part, for the following medical condition, which is the primary reason for home health care: s/p right craniotomy for excision of brain tumor   I certify that, based on my findings, the following services are medically necessary home health services: Physical therapy   Reason for Medically Necessary Home Health Services: Therapy- Therapeutic Exercises to Increase Strength and Endurance   My clinical findings support the need for the above services: Unable to leave home safely without assistance and/or assistive device   Further, I certify that my clinical findings support that this patient is homebound due to: Unable to leave home safely without assistance   Home Health   Complete by: As directed    To provide the following care/treatments:  PT OT         Allergies as of 08/07/2020   No Known Allergies     Medication List    TAKE these medications   acetaminophen 500 MG tablet Commonly known as: TYLENOL Take 500 mg by mouth every 6 (six) hours as needed for headache.   dexamethasone 2 MG tablet Commonly known as: DECADRON Take 1 tablet (2 mg total) by mouth 2 (two) times daily. What changed:   medication strength  how much to take  when to take this   HYDROcodone-acetaminophen 5-325 MG tablet Commonly known as: NORCO/VICODIN Take 1 tablet by mouth every 6 (six) hours as needed for moderate pain.   levETIRAcetam 500 MG tablet Commonly known as: KEPPRA Take 1 tablet (500 mg total) by mouth 2 (two) times daily.   Multi-Vitamin tablet Take 1 tablet by mouth daily.       Follow-up Information    Vallarie Mare, MD. Schedule an appointment as soon as possible for a visit in 1 week(s).   Specialty: Neurosurgery Contact information: [address] [place] [address] [phone]               Signed: Patricia Nettle 08/07/2020, 11:07 AM

## 2020-08-07 NOTE — Progress Notes (Signed)
Patient ordered for discharge to home. All discharge instructions reviewed with patient with good understanding verbalized. All belongings gathered by patient and sister. PIV removed. VSS. Patient transferred via wc by this nurse to main hospital pickup/entrance at this time in NAD.  Vitals:   08/07/20 1000 08/07/20 1100  BP: (!) 146/76 (!) 141/90  Pulse: 67 (!) 57  Resp: 19 17  Temp:    SpO2: 97% 97%

## 2020-08-07 NOTE — Progress Notes (Signed)
Physical Therapy Treatment Patient Details Name: Walter Rodriguez MRN: [id number] DOB: May 09, 1956 Today's Date: 08/07/2020    History of Present Illness 64 year old male who for the last 6 weeks he has been having difficulty with walking being off balance and a shuffling gait.  He has also had some weakness in his left leg.  A few days ago he was walking in the woods when he fell and was unable to get up and called his family to come and get him.  After that episode he did decided he would go for evaluation and saw his PCP and had a CT scan of the head which revealed a brain tumor.  He was then referred for an MRI which shows brain tumor with surrounding edema and some midline shift. Pt underwent R crani with tumor resection on 08/05/2020.    PT Comments    Pt tolerates treatment well demonstrating reduced assistance requirements and increased gait speed. Pt does demonstrate some left foot drag but is able to correct with cues. Pt will continue to benefit from acute PT POC to improve gait quality and to aide in restoring independence. PT continues to recommend HHPT at this time.   Follow Up Recommendations  Home health PT     Equipment Recommendations  None recommended by PT    Recommendations for Other Services       Precautions / Restrictions Precautions Precautions: Fall Restrictions Weight Bearing Restrictions: No    Mobility  Bed Mobility               General bed mobility comments: pt received and left in recliner  Transfers Overall transfer level: Needs assistance Equipment used: None Transfers: Sit to/from Stand Sit to Stand: Supervision            Ambulation/Gait Ambulation/Gait assistance: Supervision Gait Distance (Feet): 500 Feet Assistive device: None Gait Pattern/deviations: Step-through pattern;Decreased dorsiflexion - left Gait velocity: reduced Gait velocity interpretation: 1.31 - 2.62 ft/sec, indicative of limited community ambulator General  Gait Details: pt with steady step through gait initially with reduced gait speed but progressing with activity. Pt with reduced foot clearance of L foot initially which improves with continued ambulation   Stairs Stairs: Yes Stairs assistance: Supervision Stair Management: No rails Number of Stairs: 2     Wheelchair Mobility    Modified Rankin (Stroke Patients Only)       Balance Overall balance assessment: Needs assistance Sitting-balance support: Feet supported;No upper extremity supported Sitting balance-Leahy Scale: Good     Standing balance support: No upper extremity supported Standing balance-Leahy Scale: Good Standing balance comment: supervision                            Cognition Arousal/Alertness: Awake/alert Behavior During Therapy: WFL for tasks assessed/performed Overall Cognitive Status: Impaired/Different from baseline Area of Impairment: Problem solving                             Problem Solving: Slow processing        Exercises      General Comments General comments (skin integrity, edema, etc.): VSS on RA      Pertinent Vitals/Pain Pain Assessment: No/denies pain    Home Living                      Prior Function  PT Goals (current goals can now be found in the care plan section) Acute Rehab PT Goals Patient Stated Goal: to get back to walking and riding bike Progress towards PT goals: Progressing toward goals    Frequency    Min 3X/week      PT Plan Current plan remains appropriate    Co-evaluation              AM-PAC PT "6 Clicks" Mobility   Outcome Measure  Help needed turning from your back to your side while in a flat bed without using bedrails?: None Help needed moving from lying on your back to sitting on the side of a flat bed without using bedrails?: None Help needed moving to and from a bed to a chair (including a wheelchair)?: None Help needed standing up from  a chair using your arms (e.g., wheelchair or bedside chair)?: None Help needed to walk in hospital room?: None Help needed climbing 3-5 steps with a railing? : None 6 Click Score: 24    End of Session   Activity Tolerance: Patient tolerated treatment well Patient left: in chair;with call bell/phone within reach;with family/visitor present Nurse Communication: Mobility status PT Visit Diagnosis: Other abnormalities of gait and mobility (R26.89);Muscle weakness (generalized) (M62.81)     Time: 3790-2409 PT Time Calculation (min) (ACUTE ONLY): 15 min  Charges:  $Gait Training: 8-22 mins                     Zenaida Niece, PT, DPT Acute Rehabilitation Pager: [phone]    Zenaida Niece 08/07/2020, 12:27 PM

## 2020-08-08 ENCOUNTER — Encounter (HOSPITAL_COMMUNITY): Payer: Self-pay | Admitting: Neurosurgery

## 2020-08-08 ENCOUNTER — Inpatient Hospital Stay: Payer: Medicare Other | Attending: Oncology | Admitting: Oncology

## 2020-08-08 ENCOUNTER — Other Ambulatory Visit: Payer: Self-pay

## 2020-08-08 ENCOUNTER — Ambulatory Visit: Payer: Medicare Other | Admitting: Oncology

## 2020-08-08 ENCOUNTER — Other Ambulatory Visit: Payer: Self-pay | Admitting: Radiation Therapy

## 2020-08-08 VITALS — BP 168/72 | HR 57 | Temp 97.5°F | Resp 18 | Ht 68.0 in | Wt 180.2 lb

## 2020-08-08 DIAGNOSIS — R269 Unspecified abnormalities of gait and mobility: Secondary | ICD-10-CM | POA: Diagnosis not present

## 2020-08-08 DIAGNOSIS — Z7952 Long term (current) use of systemic steroids: Secondary | ICD-10-CM | POA: Insufficient documentation

## 2020-08-08 DIAGNOSIS — C439 Malignant melanoma of skin, unspecified: Secondary | ICD-10-CM

## 2020-08-08 DIAGNOSIS — R531 Weakness: Secondary | ICD-10-CM | POA: Diagnosis not present

## 2020-08-08 DIAGNOSIS — C4359 Malignant melanoma of other part of trunk: Secondary | ICD-10-CM | POA: Diagnosis not present

## 2020-08-08 DIAGNOSIS — Z79899 Other long term (current) drug therapy: Secondary | ICD-10-CM | POA: Diagnosis not present

## 2020-08-08 DIAGNOSIS — C7940 Secondary malignant neoplasm of unspecified part of nervous system: Secondary | ICD-10-CM | POA: Insufficient documentation

## 2020-08-08 DIAGNOSIS — C7931 Secondary malignant neoplasm of brain: Secondary | ICD-10-CM

## 2020-08-08 LAB — TYPE AND SCREEN
ABO/RH(D): O NEG
Antibody Screen: NEGATIVE
Unit division: 0
Unit division: 0

## 2020-08-08 LAB — BPAM RBC
Blood Product Expiration Date: 202109262359
Blood Product Expiration Date: 202109262359
Unit Type and Rh: 9500
Unit Type and Rh: 9500

## 2020-08-08 NOTE — Progress Notes (Signed)
Hematology and Oncology Follow Up Visit  Walter Rodriguez [id number] 03-17-1956 64 y.o. 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   Prior Therapy:  He is status post local wide excision as well as right axillary sentinel lymph node sampling completed by Dr. Barry Dienes on April 01, 2019.  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 Iran Planas on Apr 10, 2019.  He is status post right craniotomy and excision of brain tumor completed on August 05, 2020 by Dr. Marcello Moores.  Current therapy: Dexamethasone 4 mg twice daily.  Interim History: Mr. Walter Rodriguez is here for return evaluation.  Since the last visit he had presented with acute symptoms of weakness and being off balance and shuffling gait.  He underwent imaging studies including CT scan of the head on August 01, 2020.  CT scan showed a mass at the periphery of the frontal and the parietal lobe measuring 4.5 x 4.0 cm with extensive surrounding vasogenic edema.  1.1 cm midline shift was noted.  He was started on dexamethasone and MRI of the brain on August 24 confirmed the presence of a large 49 mm enhancing mass in the right hemisphere with vasogenic edema.  A small 6 mm enhancing lesion in the left parietal bone that is intermediate but suspicious for metastatic disease to the skull.  Based on these findings he underwent right craniotomy completed on August 05, 2020 and was discharged on August 28.  Since his discharge he is recovering reasonably well and has resumed ambulation without any difficulties.  He denies any nausea, vomiting or abdominal pain.  Denies any headaches or seizures.   Medications: Updated on review. Current Outpatient Medications  Medication Sig Dispense Refill  . acetaminophen (TYLENOL)  500 MG tablet Take 500 mg by mouth every 6 (six) hours as needed for headache.    . dexamethasone (DECADRON) 2 MG tablet Take 1 tablet (2 mg total) by mouth 2 (two) times daily. 14 tablet 0  . HYDROcodone-acetaminophen (NORCO/VICODIN) 5-325 MG tablet Take 1 tablet by mouth every 6 (six) hours as needed for moderate pain. 20 tablet 0  . levETIRAcetam (KEPPRA) 500 MG tablet Take 1 tablet (500 mg total) by mouth 2 (two) times daily. 60 tablet 0  . Multiple Vitamin (MULTI-VITAMIN) tablet Take 1 tablet by mouth daily.     No current facility-administered medications for this visit.     Allergies: No Known Allergies  Past Medical History, Surgical history, Social history, and Family History were reviewed and updated.    Physical Exam: Blood pressure (!) 168/72, pulse (!) 57, temperature (!) 97.5 F (36.4 C), temperature source Tympanic, resp. rate 18, height 5\' 8"  (1.727 m), weight 180 lb 3.2 oz (81.7 kg), SpO2 100 %.   ECOG: 0    General appearance: Comfortable appearing without any discomfort Head: Normocephalic without any trauma Oropharynx: Mucous membranes are moist and pink without any thrush or ulcers. Eyes: Pupils are equal and round reactive to light. Lymph nodes: No cervical, supraclavicular, inguinal or axillary lymphadenopathy.   Heart:regular rate and rhythm.  S1 and S2 without leg edema. Lung: Clear without any rhonchi or wheezes.  No dullness to percussion. Abdomin: Soft, nontender, nondistended with good bowel sounds.  No hepatosplenomegaly. Musculoskeletal: No joint deformity or effusion.  Full range  of motion noted. Neurological: No deficits noted on motor, sensory and deep tendon reflex exam. Skin: Surgical wound noted on his right skull without erythema or induration.      Lab Results: Lab Results  Component Value Date   WBC 12.2 (H) 08/07/2020   HGB 13.4 08/07/2020   HCT 40.9 08/07/2020   MCV 82.6 08/07/2020   PLT 230 08/07/2020     Chemistry       Component Value Date/Time   NA 137 08/07/2020 0429   K 4.4 08/07/2020 0429   CL 107 08/07/2020 0429   CO2 23 08/07/2020 0429   BUN 26 (H) 08/07/2020 0429   CREATININE 1.05 08/07/2020 0429   CREATININE 1.19 07/27/2020 0836      Component Value Date/Time   CALCIUM 8.3 (L) 08/07/2020 0429   ALKPHOS 41 08/07/2020 0429   AST 22 08/07/2020 0429   AST 20 08/14/2019 0735   ALT 31 08/07/2020 0429   ALT 17 08/14/2019 0735   BILITOT 0.8 08/07/2020 0429   BILITOT 0.5 08/14/2019 0735      IMPRESSION: 1. Mass at the junction of the periphery of the frontal and parietal lobes the left measuring 4.5 x 4.0 cm. Extensive surrounding vasogenic edema. 1.1 cm of midline shift the lateral and third ventricles to the left. Fourth ventricle midline.  2.  No hemorrhage or acute infarct.  3. Mucosal thickening in several ethmoid air cells. Probable cerumen in each external auditory canal.  These results will be called to the ordering clinician or representative by the Radiologist Assistant, and communication documented in the PACS or [employer].  IMPRESSION: 1. Large, large, 49 mm enhancing mass in the right hemisphere with extensive regional vasogenic edema, corresponding to the head CT finding yesterday. I favor the masses intra-axial, with top differential considerations of solitary Metastasis (see #3) versus High-grade primary tumor. An extra-axial Meningioma is felt less likely.  2. Intracranial mass effect with leftward midline shift up to 11 mm. Mild trapping of the left lateral ventricle with transependymal edema. Recommend Neurosurgery consultation if not already done.  3. Small 6 mm enhancing lesion in the left parietal bone is indeterminate, but suspicious for a small skull metastasis if the lesion in #1 is determined to be metastatic. Impression and Plan:   64 year old with:  1.  Nodular melanoma of the skin arising presented with that T4 BN 0 on March 2020.    He developed a stage IV disease with CNS metastasis and left parietal bone lesion.  The natural course of this disease in this presentation was discussed today with the patient and his sister but accompanied him today.  The role for systemic therapy were discussed today given his recent presentation.  The role of immunotherapy were discussed even with no measurable disease systemically it would certainly be beneficial.  Imaging studies in August 02, 2020 did not show any widespread metastatic disease.  The plan is to proceed with immunotherapy upon completing his CNS treatment.  He will need to be tapered off dexamethasone before starting immunotherapy.  Ipilimumab and nivolumab combination for 4 cycles followed by maintenance nivolumab would be the best course of action.  2.  CNS metastasis: He status post surgical resection.  We will arrange follow-up with radiation oncology regarding postoperative treatment.  3.  Dermatology surveillance: He will require dermatology surveillance moving forward.  4.  Seizure prophylaxis: He is currently on Keppra.  5  Follow-up: In the next 2 weeks to follow his progress.   [place]  minutes were dedicated to this visit. The time was spent on reviewing laboratory data, imaging studies, discussing treatment options,  and answering questions regarding future plan.       Zola Button, MD 8/30/202110:14 AM

## 2020-08-09 ENCOUNTER — Telehealth: Payer: Self-pay | Admitting: Family Medicine

## 2020-08-09 ENCOUNTER — Telehealth: Payer: Self-pay | Admitting: Oncology

## 2020-08-09 LAB — SURGICAL PATHOLOGY

## 2020-08-09 NOTE — Telephone Encounter (Signed)
Scheduled per 08/30 los, patient has been called and notified. 

## 2020-08-09 NOTE — Telephone Encounter (Signed)
Pt stated Walter Rodriguez called to advise him that he could cancel his appt with Dr. Georgina Snell for this week as it was not needed. Wanted Dr. Georgina Snell to know how much he appreciates his quick help and he truly believes Dr. Georgina Snell has helped to safe his life.

## 2020-08-10 ENCOUNTER — Ambulatory Visit: Payer: Medicare Other | Admitting: Family Medicine

## 2020-08-10 NOTE — Progress Notes (Signed)
Histology and Location of Primary Cancer: Nodular melanoma of the back diagnosed 03/2019  Location(s) of Symptomatic tumor(s):   CNS metastasis diagnosed in 07/2020. Left parietal bone lesion.  MRI Brain 08/06/2020: Gross total resection of right parietal mass lesion. There remains extensive vasogenic edema. 9 mm midline shift to the left is mildly improved. No acute infarct.  5 mm enhancing lesion left parietal bone suspicious for metastatic disease.  CT CAP 08/02/2020: No evidence of recurrent or metastatic disease within the chest, abdomen, or pelvis.  MRI Brain 08/02/2020: Large 49 mm enhancing mass in the right hemisphere with extensive regional vasogenic edema, corresponding to the head CT finding yesterday.  CT Head 08/01/2020: Mass at the junction of the periphery of the frontal and parietal lobes, the left measuring 4.5 x 4.0 cm.  Extensive surrounding vasogenic edema.  1.1 cm of midline shift the lateral and third ventricles to the left.  Fourth ventricle midline.  Biopsy: Right Brain Tumor 08/05/2020   Past/ Anticipated interventions by surgery, if any: Dr. Marcello Moores - 08/05/2020: Right craniotomy and excision of brain tumor -Follow up 08/12/2020  Dr. Iran Planas - Apr 10, 2019: skin graft  Dr. Barry Dienes - April 01, 2019: local wide excision as well as right axillary sentinel lymph node sampling. 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  Past/Anticipated chemotherapy by medical oncology, if any:  Dr. Alen Blew 08/08/2020 -The plan is to proceed with immunotherapy upon completing his CNS treatment.  He will need to be tapered off dexamethasone before starting immunotherapy.  Ipilimumab and nivolumab combination for 4 cycles followed by maintenance nivolumab would be the best course of action. -CNS metastasis: He status post surgical resection.  We will arrange follow-up with radiation oncology regarding postoperative treatment.   Pain on a scale of 0-10 is:  0    Current Decadron regimen, if applicable: 2 mg BID  Ambulatory status? Walker? Wheelchair?: Ambulatory with cane on occasion.  SAFETY ISSUES:  Prior radiation? no  Pacemaker/ICD? No  Possible current pregnancy? n/a  Is the patient on methotrexate? no  Additional Complaints / other details:

## 2020-08-11 ENCOUNTER — Ambulatory Visit
Admission: RE | Admit: 2020-08-11 | Discharge: 2020-08-11 | Disposition: A | Discharge status: Home / Self Care | Payer: Medicare Other | Source: Ambulatory Visit | Attending: Radiation Oncology | Admitting: Radiation Oncology

## 2020-08-11 ENCOUNTER — Other Ambulatory Visit: Payer: Self-pay

## 2020-08-11 ENCOUNTER — Encounter: Payer: Self-pay | Admitting: Radiation Oncology

## 2020-08-11 VITALS — Ht 68.0 in | Wt 180.0 lb

## 2020-08-11 DIAGNOSIS — C7931 Secondary malignant neoplasm of brain: Secondary | ICD-10-CM

## 2020-08-11 DIAGNOSIS — C7949 Secondary malignant neoplasm of other parts of nervous system: Secondary | ICD-10-CM

## 2020-08-11 DIAGNOSIS — C4359 Malignant melanoma of other part of trunk: Secondary | ICD-10-CM

## 2020-08-11 DIAGNOSIS — D496 Neoplasm of unspecified behavior of brain: Secondary | ICD-10-CM

## 2020-08-11 DIAGNOSIS — G935 Compression of brain: Secondary | ICD-10-CM

## 2020-08-12 ENCOUNTER — Encounter: Payer: Self-pay | Admitting: *Deleted

## 2020-08-12 NOTE — Progress Notes (Signed)
Radiation Oncology         [phone] ________________________________  Initial Outpatient Consultation - Conducted via telephone due to current COVID-19 concerns for limiting patient exposure  I spoke with the patient to conduct this consult visit via telephone to spare the patient unnecessary potential exposure in the healthcare setting during the current COVID-19 pandemic. The patient was notified in advance and was offered a [place] meeting to allow for face to face communication but unfortunately reported that they did not have the appropriate resources/technology to support such a visit and instead preferred to proceed with a telephone consult.     Name: Walter Rodriguez        MRN: [id number]  Date of Service: 08/11/2020 DOB: 1956-07-29  CC:[name], MD  Wyatt Portela, MD     REFERRING PHYSICIAN: Wyatt Portela, MD   DIAGNOSIS: The primary encounter diagnosis was Secondary malignant neoplasm of brain and spinal cord [facility]). Diagnoses of Neoplasm of brain causing mass effect on adjacent structures ([place]) and Melanoma of trunk ([place]) were also pertinent to this visit.   HISTORY OF PRESENT ILLNESS: Walter Rodriguez is a 64 y.o. male seen at the request of Dr. 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  The patient was diagnosed with T4BN0 disease at the time of resection with right axillary node sampling in April 2020.  He has been followed in active surveillance, and unfortunately developed symptoms of gait disturbance, confusion, and fall.  He presented to the emergency room on 08/01/2020 with complaints of lower extremity weakness and falls.  This revealed a 4.5 x 4 cm mass in the right frontal temporal junction with extensive surrounding vasogenic edema there was shift of the lateral ventricles from the midline by 1.1 cm.  MRI scan on 08/02/2020 confirmed this finding measuring up to 49 mm in the right hemisphere with extensive vasogenic  edema.  He subsequently underwent surgical resection on 08/05/2020 with Dr. Marcello Moores.  Postop imaging with an MRI on 08/06/2020 reveals gross total resection of the right parietal mass lesion with extensive vasogenic edema, a 9 mm midline shift to the left was mildly improved, there was a 5 mm enhancing left parietal bone lesion suspicious for metastatic disease as well.  Final pathology resulted as metastatic melanoma.  He is contacted today to discuss options of postoperative stereotactic radiosurgery.   PREVIOUS RADIATION THERAPY: No   PAST MEDICAL HISTORY:  Past Medical History:  Diagnosis Date  . Melanoma ([place])    back  . Open wound    back       PAST SURGICAL HISTORY: Past Surgical History:  Procedure Laterality Date  . APPLICATION OF CRANIAL NAVIGATION N/A 08/05/2020   Procedure: APPLICATION OF CRANIAL NAVIGATION;  Surgeon: Vallarie Mare, MD;  Location: [place];  Service: Neurosurgery;  Laterality: N/A;  . CRANIOTOMY N/A 08/05/2020   Procedure: CRANIOTOMY TUMOR EXCISION;  Surgeon: Vallarie Mare, MD;  Location: [place];  Service: Neurosurgery;  Laterality: N/A;  . MELANOMA EXCISION WITH SENTINEL LYMPH NODE BIOPSY N/A 04/01/2019   Procedure: WIDE LOCAL EXCISION OF BACK MELANOMA WITH RIGHT AXILLARY SENTINEL LYMPH NODE BIOPSY;  Surgeon: Stark Klein, MD;  Location: [place];  Service: General;  Laterality: N/A;  . SKIN SPLIT GRAFT N/A 04/10/2019   Procedure: SPLIT THICKNESS SKIN GRAFT FROM RIGHT  THIGH TO THE BACK;  Surgeon: Irene Limbo, MD;  Location: [place];  Service: Plastics;  Laterality: N/A;  FAMILY HISTORY: History reviewed. No pertinent family history.   SOCIAL HISTORY:  reports that he has never smoked. He has never used smokeless tobacco. He reports that he does not drink alcohol and does not use drugs.   ALLERGIES: Patient has no known allergies.   MEDICATIONS:  Current Outpatient Medications  Medication Sig Dispense Refill  . acetaminophen (TYLENOL) 500 MG  tablet Take 500 mg by mouth every 6 (six) hours as needed for headache.    . dexamethasone (DECADRON) 2 MG tablet Take 1 tablet (2 mg total) by mouth 2 (two) times daily. 14 tablet 0  . levETIRAcetam (KEPPRA) 500 MG tablet Take 1 tablet (500 mg total) by mouth 2 (two) times daily. 60 tablet 0  . Multiple Vitamin (MULTI-VITAMIN) tablet Take 1 tablet by mouth daily.    Marland Kitchen HYDROcodone-acetaminophen (NORCO/VICODIN) 5-325 MG tablet Take 1 tablet by mouth every 6 (six) hours as needed for moderate pain. (Patient not taking: Reported on 08/11/2020) 20 tablet 0   No current facility-administered medications for this encounter.     REVIEW OF SYSTEMS: On review of systems, the patient reports that he is doing very well.  Prior to his diagnosis of brain disease, he was walking at least an hour every day as well as bicycling.  Physically he has been extremely active and otherwise quite healthy.  He reports he started back walking, but is not going to ride a bike for fear of falling until he feels more short of himself.  He is no longer having mental fog or any headaches at this time and is currently taking dexamethasone 2 mg once a day.  Is doing well overall.  He denies any chest pain, shortness of breath, cough, fevers, chills, night sweats, unintended weight changes.  He denies any bowel or bladder disturbances, and denies abdominal pain, nausea or vomiting.  He denies any new musculoskeletal or joint aches or pains. A complete review of systems is obtained and is otherwise negative.     PHYSICAL EXAM:  Wt Readings from Last 3 Encounters:  08/11/20 180 lb (81.6 kg)  08/08/20 180 lb 3.2 oz (81.7 kg)  08/05/20 178 lb 2.1 oz (80.8 kg)   Unable to assess given encounter type.   ECOG = 1  0 - Asymptomatic (Fully active, able to carry on all predisease activities without restriction)  1 - Symptomatic but completely ambulatory (Restricted in physically strenuous activity but ambulatory and able to carry out  work of a light or sedentary nature. For example, light housework, office work)  2 - Symptomatic, <50% in bed during the day (Ambulatory and capable of all self care but unable to carry out any work activities. Up and about more than 50% of waking hours)  3 - Symptomatic, >50% in bed, but not bedbound (Capable of only limited self-care, confined to bed or chair 50% or more of waking hours)  4 - Bedbound (Completely disabled. Cannot carry on any self-care. Totally confined to bed or chair)  5 - Death   Eustace Pen MM, Creech RH, Tormey DC, et al. [phone]). "Toxicity and response criteria of the [facility]". [place] Oncol. 5 (6): 649-55    LABORATORY DATA:  Lab Results  Component Value Date   WBC 12.2 (H) 08/07/2020   HGB 13.4 08/07/2020   HCT 40.9 08/07/2020   MCV 82.6 08/07/2020   PLT 230 08/07/2020   Lab Results  Component Value Date   NA 137 08/07/2020   K 4.4 08/07/2020  CL 107 08/07/2020   CO2 23 08/07/2020   Lab Results  Component Value Date   ALT 31 08/07/2020   AST 22 08/07/2020   ALKPHOS 41 08/07/2020   BILITOT 0.8 08/07/2020      RADIOGRAPHY: DG Lumbar Spine 2-3 Views  Result Date: 07/27/2020 CLINICAL DATA:  64 year old male with leg weakness. Vertigo for 1 month. EXAM: LUMBAR SPINE - 2-3 VIEW COMPARISON:  CT Abdomen and Pelvis 08/18/2019. FINDINGS: Normal lumbar segmentation. Stable lumbar lordosis. No spondylolisthesis. Advanced chronic disc and endplate degeneration at L4-L5 and L5-S1. Vacuum disc at the latter. Mild to moderate facet hypertrophy at those levels. Moderate disc space loss also at L3-L4, trace vacuum disc there last year. No acute osseous abnormality identified. Sacrum appears intact. Calcified aortic atherosclerosis. Negative other abdominal visceral contours. IMPRESSION: 1. No acute osseous abnormality identified in the lumbar spine. 2. Advanced chronic disc degeneration L3-L4 through L5-S1. 3.  Aortic Atherosclerosis  (ICD10-I70.0). Electronically Signed   By: Genevie Ann M.D.   On: 07/27/2020 17:52   CT HEAD WO CONTRAST  Result Date: 08/03/2020 CLINICAL DATA:  Brain tumor.  BrainLAB protocol. EXAM: CT HEAD WITHOUT CONTRAST TECHNIQUE: Contiguous axial images were obtained from the base of the skull through the vertex without intravenous contrast. COMPARISON:  Head CT 08/01/2020.  MRI yesterday. FINDINGS: Brain: BrainLAB protocol for operative guidance in localization. Right peripheral parietal hyperdense mass measuring up to 4.9 cm in diameter. Extensive regional vasogenic edema. Mass effect with left-to-right midline shift of 12 mm. No change in ventricular size. Vascular: No abnormal vascular finding. Skull: Negative Sinuses/Orbits: Clear/normal Other: None IMPRESSION: BrainLAB protocol for operative guidance. 4.9 cm right peripheral parietal hyperdense mass with extensive regional vasogenic edema. Mass effect with left-to-right midline shift of 12 mm. Electronically Signed   By: Nelson Chimes M.D.   On: 08/03/2020 19:29   CT HEAD WO CONTRAST  Result Date: 08/01/2020 CLINICAL DATA:  Lower extremity weakness and recent falls EXAM: CT HEAD WITHOUT CONTRAST TECHNIQUE: Contiguous axial images were obtained from the base of the skull through the vertex without intravenous contrast. COMPARISON:  None. FINDINGS: Brain: There is an apparent mass arising at the right frontal-temporal junction measuring 4.5 x 4.0 cm with extensive surrounding vasogenic edema. There is shift of the lateral ventricles from the midline by 1.1 cm. Third ventricle is also shifted to the left. Fourth ventricle is midline. There is no evident hemorrhage. There is no subdural or epidural fluid collection. No acute appearing infarct evident. Vascular: No hyperdense vessel. No appreciable vascular calcification. Skull: The bony calvarium appears intact. Sinuses/Orbits: Mucosal thickening is noted in several ethmoid air cells. Other visualized paranasal sinuses  are clear. Visualized orbits appear symmetric bilaterally. Other: Mastoid air cells are clear. There is debris in each external auditory canal, likely due to cerumen. IMPRESSION: 1. Mass at the junction of the periphery of the frontal and parietal lobes the left measuring 4.5 x 4.0 cm. Extensive surrounding vasogenic edema. 1.1 cm of midline shift the lateral and third ventricles to the left. Fourth ventricle midline. 2.  No hemorrhage or acute infarct. 3. Mucosal thickening in several ethmoid air cells. Probable cerumen in each external auditory canal. These results will be called to the ordering clinician or representative by the Radiologist Assistant, and communication documented in the PACS or [employer]. Electronically Signed   By: Lowella Grip III M.D.   On: 08/01/2020 13:07   CT Chest W Contrast  Result Date: 08/02/2020 CLINICAL DATA:  Melanoma.  Surveillance  examination. EXAM: CT CHEST, ABDOMEN, AND PELVIS WITH CONTRAST TECHNIQUE: Multidetector CT imaging of the chest, abdomen and pelvis was performed following the standard protocol during bolus administration of intravenous contrast. CONTRAST:  175mL OMNIPAQUE IOHEXOL 300 MG/ML  SOLN COMPARISON:  08/18/2019 FINDINGS: CT CHEST FINDINGS Cardiovascular: No significant vascular findings. Normal heart size. No pericardial effusion. Thoracic aorta is unremarkable. Mediastinum/Nodes: No enlarged mediastinal, hilar, or axillary lymph nodes. Surgical changes of right axillary lymph node dissection are identified. Thyroid gland, trachea, and esophagus demonstrate no significant findings. Lungs/Pleura: Lungs are clear. No pleural effusion or pneumothorax. Musculoskeletal: Subcentimeter sclerotic focus within the right eighth rib just beyond the costovertebral junction is stable since prior examination and likely a benign bone island. No suspicious lytic or blastic bone lesions are seen within the thorax. CT ABDOMEN PELVIS FINDINGS Hepatobiliary: No  focal liver abnormality is seen. No gallstones, gallbladder wall thickening, or biliary dilatation. Pancreas: Unremarkable Spleen: Unremarkable Adrenals/Urinary Tract: The adrenal glands are unremarkable. Simple cortical cysts again noted within the kidneys. The kidneys are otherwise unremarkable. The bladder is unremarkable. Stomach/Bowel: The stomach, small bowel, and large bowel are unremarkable. Appendix normal. No free intraperitoneal gas or fluid. Vascular/Lymphatic: Mild aorta atherosclerotic calcification noted. No aneurysm. No pathologic adenopathy within the abdomen and pelvis. Reproductive: Prostate is unremarkable. Other: Rectum unremarkable.  Tiny fat containing umbilical hernia. Musculoskeletal: Degenerative changes are seen within the lumbar spine. Vertebral hemangioma noted within the L2 vertebral body. No focal suspicious lytic or blastic bone lesions are seen. IMPRESSION: 1. No evidence of recurrent or metastatic disease within the chest, abdomen, or pelvis. 2. Aortic atherosclerosis. Aortic Atherosclerosis (ICD10-I70.0). Electronically Signed   By: Fidela Salisbury MD   On: 08/02/2020 21:27   MR BRAIN W WO CONTRAST  Result Date: 08/06/2020 CLINICAL DATA:  Craniotomy for tumor resection 08/05/2020. History of melanoma. EXAM: MRI HEAD WITHOUT AND WITH CONTRAST TECHNIQUE: Multiplanar, multiecho pulse sequences of the brain and surrounding structures were obtained without and with intravenous contrast. CONTRAST:  44mL GADAVIST GADOBUTROL 1 MMOL/ML IV SOLN COMPARISON:  MRI head with contrast 08/02/2020 FINDINGS: Brain: Right parietal craniotomy and resection of right parietal mass lesion. Surgical cavity contains fluid and blood. No acute infarct. No residual enhancing mass lesion. The mass did enhance on the preoperative study. Extensive vasogenic edema throughout the surrounding white matter similar to the prior study. Mass-effect and 9 mm of midline shift slightly improved. No other enhancing  lesion in the brain is identified. Vascular: Normal arterial flow voids Skull and upper cervical spine: 5 mm enhancing lesion in the left parietal bone unchanged from the prior study. There is erosion of the inner and outer table. Probable metastatic deposit. Sinuses/Orbits: Paranasal sinuses clear.  Negative orbit Other: None IMPRESSION: Gross total resection of right parietal mass lesion. There remains extensive vasogenic edema. 9 mm midline shift to the left is mildly improved. No acute infarct 5 mm enhancing lesion left parietal bone suspicious for metastatic disease. Electronically Signed   By: Franchot Gallo M.D.   On: 08/06/2020 22:00   MR BRAIN W WO CONTRAST  Addendum Date: 08/02/2020   ADDENDUM REPORT: 08/02/2020 17:15 ADDENDUM: Study discussed by telephone with Dr. Lynne Leader on 08/02/2020 at 17:13. He also advises that the patient had a melanoma treated with resection last year, although subsequent CT Chest, Abdomen, and Pelvis were negative for metastatic disease at that time. Electronically Signed   By: Genevie Ann M.D.   On: 08/02/2020 17:15   Result Date: 08/02/2020 CLINICAL DATA:  64 year old male with lower extremity weakness and evidence of right hemisphere mass with vasogenic edema on noncontrast head CT yesterday. EXAM: MRI HEAD WITHOUT AND WITH CONTRAST TECHNIQUE: Multiplanar, multiecho pulse sequences of the brain and surrounding structures were obtained without and with intravenous contrast. CONTRAST:  7.42mL GADAVIST GADOBUTROL 1 MMOL/ML IV SOLN COMPARISON:  Head CT 08/01/2020. FINDINGS: Brain: Large lobulated and enhancing mass in the right hemisphere measures up to 49 mm long axis (series 16, image 5) with extensive regional T2 and FLAIR hyperintense vasogenic edema, corresponding to the head CT finding yesterday. No evidence of mineralization or blood products within the mass. It appears to be intra-axial, with no definite cortex displaced medial to the mass. And no other enhancing brain  lesion. Intracranial mass effect with leftward midline shift up to 11 mm. Subtotal effacement of the right lateral ventricle and mild trapping of the left lateral ventricle with occipital horn transependymal edema (series 8 image 26). Basilar cisterns remain patent. No superimposed restricted diffusion to suggest acute infarction. No extra-axial collection or acute intracranial hemorrhage. Cervicomedullary junction and pituitary are within normal limits. Background cerebral gray and white matter signal appears normal. No encephalomalacia or chronic cerebral blood products identified. No definite dural thickening. Vascular: Major intracranial vascular flow voids are preserved. The major dural venous sinuses are enhancing and appear to be patent. Skull and upper cervical spine: Negative visible cervical spine and spinal cord. In general bone marrow signal is within normal limits. But there is a small T1 hypointense and enhancing lesion of the left parietal bone (6 mm series 14, image 126) with faint suspicious diffusion signal changes on series 4, image 61. No other suspicious marrow lesion is identified. Sinuses/Orbits: Negative. Other: Mastoids are clear. Visible internal auditory structures appear normal. IMPRESSION: 1. Large, large, 49 mm enhancing mass in the right hemisphere with extensive regional vasogenic edema, corresponding to the head CT finding yesterday. I favor the masses intra-axial, with top differential considerations of solitary Metastasis (see #3) versus High-grade primary tumor. An extra-axial Meningioma is felt less likely. 2. Intracranial mass effect with leftward midline shift up to 11 mm. Mild trapping of the left lateral ventricle with transependymal edema. Recommend Neurosurgery consultation if not already done. 3. Small 6 mm enhancing lesion in the left parietal bone is indeterminate, but suspicious for a small skull metastasis if the lesion in #1 is determined to be metastatic.  Electronically Signed: By: Genevie Ann M.D. On: 08/02/2020 16:59   CT ABDOMEN PELVIS W CONTRAST  Result Date: 08/02/2020 CLINICAL DATA:  Melanoma.  Surveillance examination. EXAM: CT CHEST, ABDOMEN, AND PELVIS WITH CONTRAST TECHNIQUE: Multidetector CT imaging of the chest, abdomen and pelvis was performed following the standard protocol during bolus administration of intravenous contrast. CONTRAST:  12mL OMNIPAQUE IOHEXOL 300 MG/ML  SOLN COMPARISON:  08/18/2019 FINDINGS: CT CHEST FINDINGS Cardiovascular: No significant vascular findings. Normal heart size. No pericardial effusion. Thoracic aorta is unremarkable. Mediastinum/Nodes: No enlarged mediastinal, hilar, or axillary lymph nodes. Surgical changes of right axillary lymph node dissection are identified. Thyroid gland, trachea, and esophagus demonstrate no significant findings. Lungs/Pleura: Lungs are clear. No pleural effusion or pneumothorax. Musculoskeletal: Subcentimeter sclerotic focus within the right eighth rib just beyond the costovertebral junction is stable since prior examination and likely a benign bone island. No suspicious lytic or blastic bone lesions are seen within the thorax. CT ABDOMEN PELVIS FINDINGS Hepatobiliary: No focal liver abnormality is seen. No gallstones, gallbladder wall thickening, or biliary dilatation. Pancreas: Unremarkable Spleen: Unremarkable Adrenals/Urinary Tract:  The adrenal glands are unremarkable. Simple cortical cysts again noted within the kidneys. The kidneys are otherwise unremarkable. The bladder is unremarkable. Stomach/Bowel: The stomach, small bowel, and large bowel are unremarkable. Appendix normal. No free intraperitoneal gas or fluid. Vascular/Lymphatic: Mild aorta atherosclerotic calcification noted. No aneurysm. No pathologic adenopathy within the abdomen and pelvis. Reproductive: Prostate is unremarkable. Other: Rectum unremarkable.  Tiny fat containing umbilical hernia. Musculoskeletal: Degenerative  changes are seen within the lumbar spine. Vertebral hemangioma noted within the L2 vertebral body. No focal suspicious lytic or blastic bone lesions are seen. IMPRESSION: 1. No evidence of recurrent or metastatic disease within the chest, abdomen, or pelvis. 2. Aortic atherosclerosis. Aortic Atherosclerosis (ICD10-I70.0). Electronically Signed   By: Fidela Salisbury MD   On: 08/02/2020 21:27   DG Cervical Spine 2-3Vclearing  Result Date: 07/27/2020 CLINICAL DATA:  64 year old male with weakness in legs. New abnormal gait. Vertigo for 1 month. Melanoma. EXAM: LIMITED CERVICAL SPINE FOR TRAUMA CLEARING - 2-3 VIEW COMPARISON:  Neck CT 03/03/2019. FINDINGS: Stable straightening of cervical lordosis. Subtle anterolisthesis of C4 on C5 is stable. Advanced chronic disc and endplate degeneration S8-N4 and C6-C7. Preserved C1-C2 alignment and joint spaces. Preserved AP alignment. No acute osseous abnormality identified. Negative visible upper chest. The bulky exophytic skin mass seen in on the 2020 CT is no longer evident. IMPRESSION: 1. No acute osseous abnormality identified in the cervical spine. 2. Advanced chronic disc and endplate degeneration at C5-C6 and C6-C7. Electronically Signed   By: Genevie Ann M.D.   On: 07/27/2020 17:55       IMPRESSION/PLAN: 1. Metastatic melanoma to the brain.  Dr. Lisbeth Renshaw discusses the findings thus far and work-up.  He recommends a course of stereotactic body radiotherapy.  We reviewed the delivery and logistics of this treatment and the need for an additional MRI which is scheduled for 08/22/2020.  This is to better plan a more detailed approach to his treatment.  The patient is in agreement with this plan.  We discussed the course of radiotherapy and Dr. Lisbeth Renshaw has recommended a 3 fraction regimen.  The patient is in agreement after we reviewed the risks benefits, short and long-term effects as well as delivery and logistics of the sessions.  He will come in on 08/23/2020 for simulation  and begin treatment the following Thursday.  He will also follow-up with Dr. Marcello Moores who will plan to remove his staples tomorrow.  Given current concerns for patient exposure during the COVID-19 pandemic, this encounter was conducted via telephone.  The patient has provided two factor identification and has given verbal consent for this type of encounter and has been advised to only accept a meeting of this type in a secure network environment. The time spent during this encounter was 60 minutes including preparation, discussion, and coordination of the patient's care. The attendants for this meeting include Mont Dutton, RT, Blenda Nicely, RN, Dr. Lisbeth Renshaw, Hayden Pedro  and [name] and his Sister Alinda Sierras.  During the encounter, Mont Dutton, RT, Blenda Nicely, RN, Dr. Lisbeth Renshaw, and Hayden Pedro were located at [facility] Radiation Oncology Department.  [name] was located at home with his sister Mechele Claude.  The above documentation reflects my direct findings during this shared patient visit. Please see the separate note by Dr. Lisbeth Renshaw on this date for the remainder of the patient's plan of care.    Carola Rhine, PAC

## 2020-08-12 NOTE — Progress Notes (Signed)
[name] Psychosocial Distress Screening Clinical Social Work  Clinical Social Work was referred by distress screening protocol.  The patient scored a 5 on the Psychosocial Distress Thermometer which indicates moderate distress. Clinical Social Worker contacted patient by phone to assess for distress and other psychosocial needs.   CSW spoke with patient and patient's sister, Walter Rodriguez.  Walter Rodriguez reported coping adequately- briefly shared he was discouraged he could not return home alone but is agreeable to reside with his sister until treatment is completed. CSW discussed the emotional implications of a cancer diagnosis normalized feelings of stress.  CSW encouraged patient to meet with CSW for brief counseling session as outlet to process feelings.  Patient was agreeable to one visit and scheduled appointment with CSW.  ONCBCN DISTRESS SCREENING 08/11/2020  Screening Type Initial Screening  Distress experienced in past week (1-10) 5  Emotional problem type Adjusting to illness;Adjusting to appearance changes  Information Concerns Type Lack of info about treatment  Other Contact via phone    Clinical Social Worker follow up needed: Yes.    If yes, follow up plan: Appt scheduled for 9/13  [name], LCSW

## 2020-08-22 ENCOUNTER — Ambulatory Visit
Admission: RE | Admit: 2020-08-22 | Discharge: 2020-08-22 | Disposition: A | Discharge status: Home / Self Care | Payer: Medicare Other | Source: Ambulatory Visit | Attending: Radiation Oncology | Admitting: Radiation Oncology

## 2020-08-22 ENCOUNTER — Other Ambulatory Visit: Payer: Medicare Other | Admitting: *Deleted

## 2020-08-22 DIAGNOSIS — C7931 Secondary malignant neoplasm of brain: Secondary | ICD-10-CM

## 2020-08-22 DIAGNOSIS — C7949 Secondary malignant neoplasm of other parts of nervous system: Secondary | ICD-10-CM

## 2020-08-22 IMAGING — MR MR HEAD WO/W CM
11 series · 48 of 48 positions shown · IV contrast (multihance)
Comparison: [DATE] MRI head and prior.

CLINICAL DATA: Brain/CNS neoplasm.

EXAM:
MRI HEAD WITHOUT AND WITH CONTRAST
TECHNIQUE: Multiplanar, multiecho pulse sequences of the brain and surrounding
structures were obtained without and with intravenous contrast.
CONTRAST:  17mL MULTIHANCE GADOBENATE DIMEGLUMINE 529 MG/ML IV SOLN

[Series 2: FLAIR · sagittal · 3.0mm · 0.75mm/px · 2 of 39 slices shown (1 of 2)]
[im 1/39]
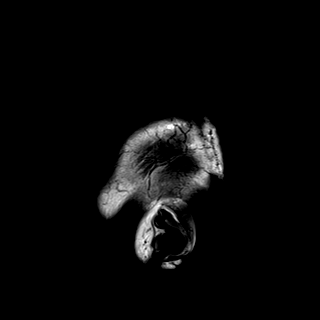
[im 39/39]
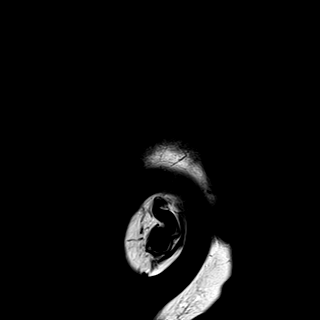

[Series 3: DWI · axial · 3.0mm · 1.50mm/px · z∈[-80,+72]mm · 5 of 80 slices shown (1 of 2)]
[im 1/80]
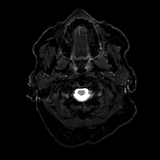
[im 20/80]
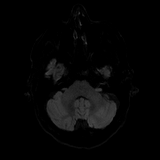
[im 40/80]
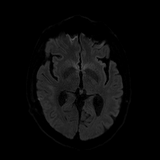
[im 60/80]
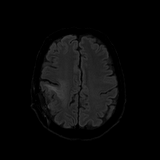
[im 80/80]
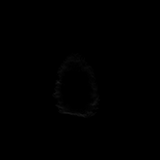

[Series 4: DWI · axial · 3.0mm · 1.50mm/px · z∈[-80,+72]mm · 2 of 36 slices shown (2 of 2)]
[im 1/36]
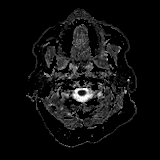
[im 36/36]
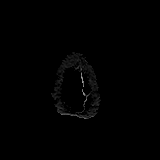

[Series 5: T2 · axial · 5.0mm · 0.57mm/px · z∈[-82,+74]mm · 2 of 27 slices shown]
[im 1/27]
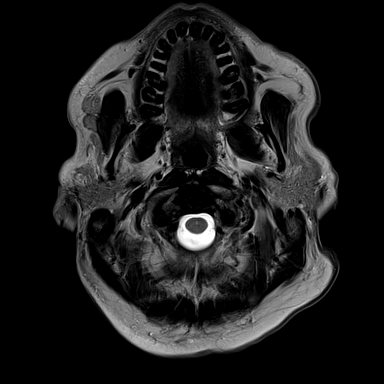
[im 27/27]
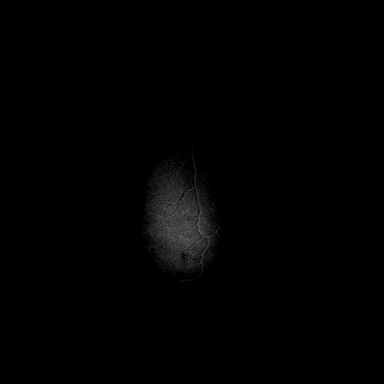

[Series 7: swi_images · axial · 1.5mm · 0.90mm/px · z∈[-73,+69]mm · 6 of 96 slices shown]
[im 1/96]
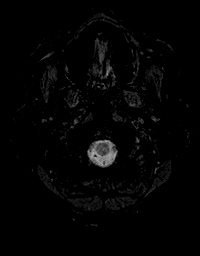
[im 20/96]
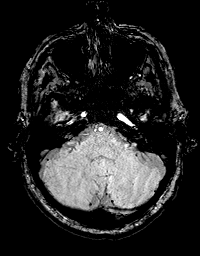
[im 39/96]
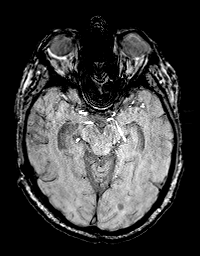
[im 58/96]
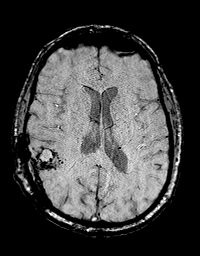
[im 77/96]
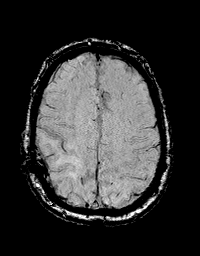
[im 96/96]
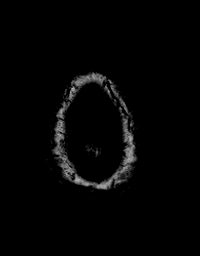

[Series 8: FLAIR · axial · 3.0mm · 0.86mm/px · z∈[-84,+81]mm · 3 of 56 slices shown (2 of 2)]
[im 1/56]
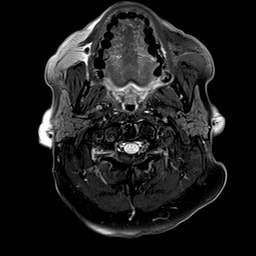
[im 28/56]
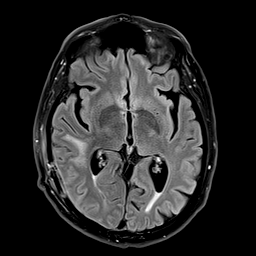
[im 56/56]
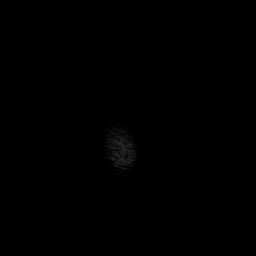

[Series 9: T1 · axial · 1.0mm · 0.75mm/px · z∈[-78,+81]mm · 10 of 160 slices shown]
[im 1/160]
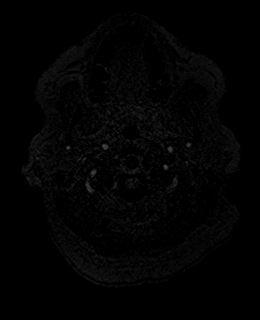
[im 18/160]
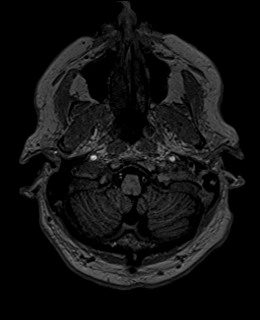
[im 36/160]
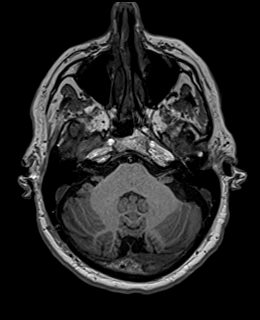
[im 54/160]
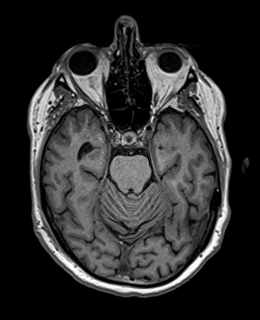
[im 71/160]
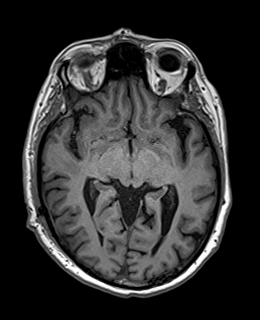
[im 89/160]
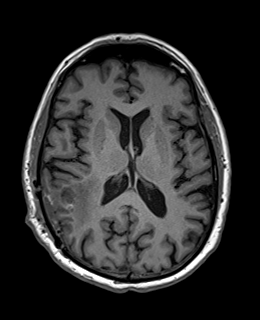
[im 107/160]
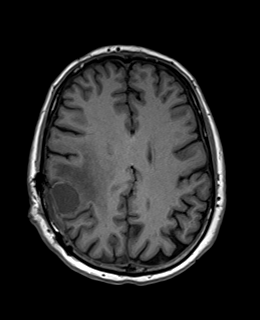
[im 124/160]
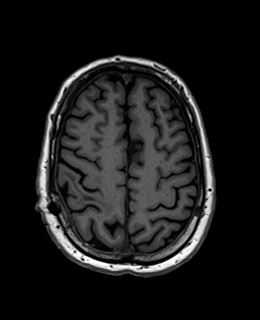
[im 142/160]
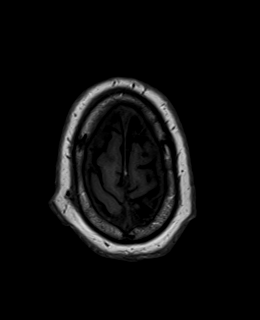
[im 160/160]
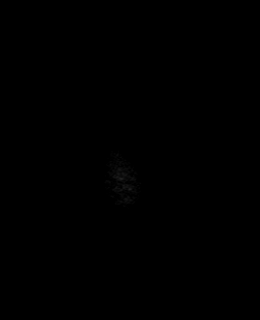

[Series 10: T2 post-contrast · coronal · 3.0mm · 0.57mm/px · 3 of 47 slices shown]
[im 1/47]
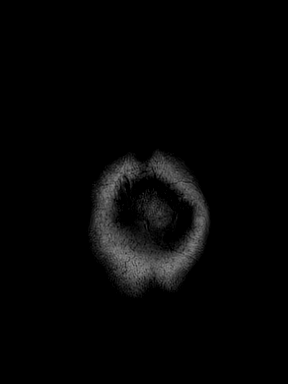
[im 24/47]
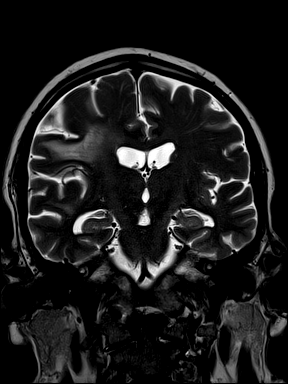
[im 47/47]
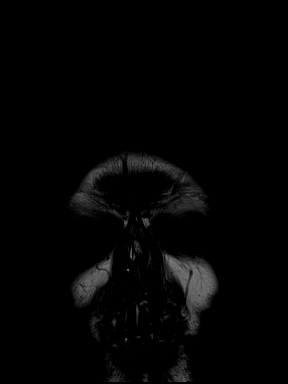

[Series 11: T1 post-contrast · axial · 1.0mm · 0.75mm/px · z∈[-78,+81]mm · 10 of 160 slices shown (1 of 2)]
[im 1/160]
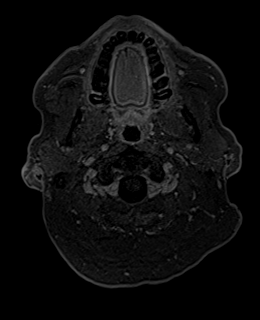
[im 18/160]
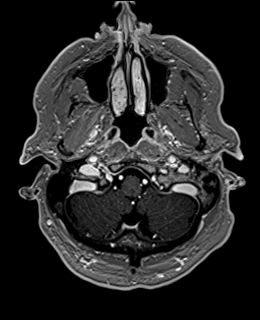
[im 36/160]
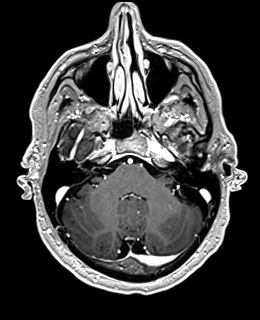
[im 54/160]
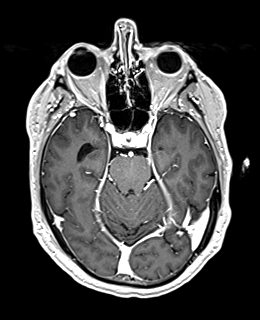
[im 71/160]
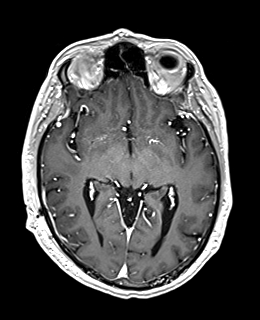
[im 89/160]
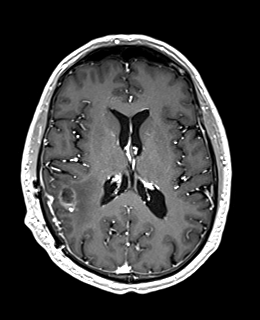
[im 107/160]
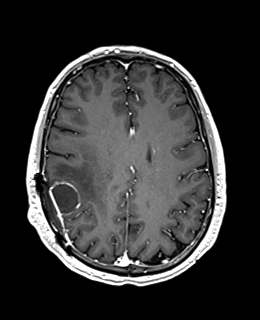
[im 124/160]
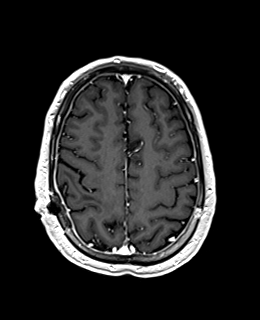
[im 142/160]
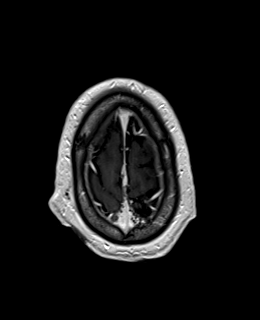
[im 160/160]
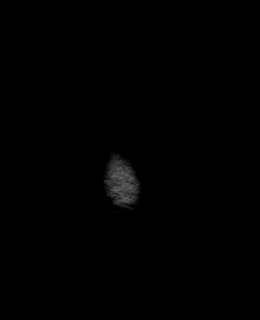

[Series 12: T1 post-contrast · coronal · 3.0mm · 0.57mm/px · 3 of 47 slices shown (2 of 2)]
[im 1/47]
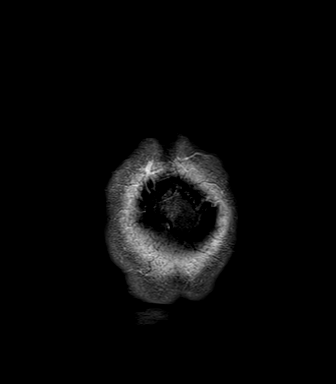
[im 24/47]
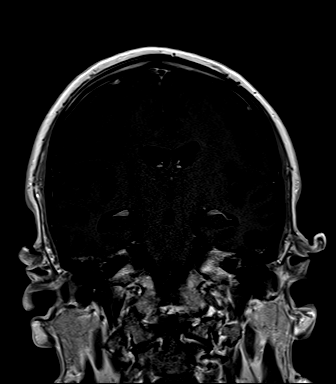
[im 47/47]
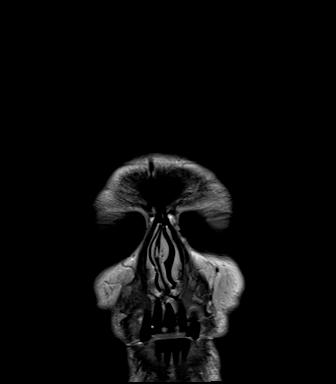

[Series 13: FLAIR post-contrast · sagittal · 3.0mm · 0.75mm/px · 2 of 39 slices shown]
[im 1/39]
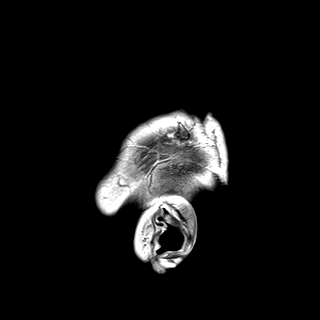
[im 39/39]
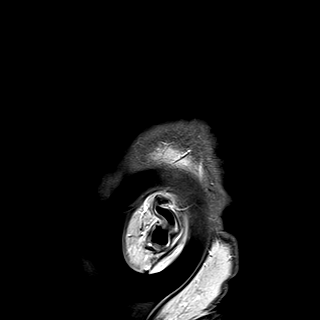

[48 of 48 positions shown; findings below may reference images not displayed]

FINDINGS: Brain: Sequela of right parietal craniotomy for mass resection.
Decreased hemosiderin deposition within the resection cavity. There
is new thin peripheral enhancement along the resection margins which
demonstrates micronodularity along the inferior and medial aspect of
the resection cavity ([DATE], 99). One of the nodules demonstrates
focal restricted diffusion ([DATE]). Mild dural thickening and
enhancement with 5 mm extra-axial collection underlying the
craniotomy site.

No new mass lesion. Decreased leftward midline shift of 4 mm,
previously 9 mm. No ventriculomegaly. Decreased right cerebral
edema. Minimal chronic microvascular ischemic changes.

Vascular: Normal flow voids.

Skull and upper cervical spine: 5 mm enhancing left parietal bone
lesion is unchanged ([DATE]). No new enhancing calvarial lesions.

Sinuses/Orbits: Normal orbits. Clear paranasal sinuses. No mastoid
effusion.

Other: None.
IMPRESSION: Sequela of right parietal mass resection. New resection margin
enhancement with micronodularity along the inferomedial aspect and
small focus of restricted diffusion.

5 mm left parietal bone enhanced lesion is unchanged.

Decreased leftward midline shift and right cerebral edema.

5 mm epidural hematoma underlying the craniotomy site.

## 2020-08-22 MED ORDER — GADOBENATE DIMEGLUMINE 529 MG/ML IV SOLN
17.0000 mL | Freq: Once | INTRAVENOUS | Status: AC | PRN
  Administered 2020-08-22: 17 mL via INTRAVENOUS

## 2020-08-22 NOTE — Progress Notes (Signed)
Has armband been applied?  Yes  Does patient have an allergy to IV contrast dye?: No   Has patient ever received premedication for IV contrast dye?:  n/a  Does patient take metformin?: No  If patient does take metformin when was the last dose: n/a  Date of lab work: 08/07/2020 BUN: 26 CR: 1.05 EGFR: >60  IV site: Right AC  Has IV site been added to flowsheet?  Yes  Completed Decadron and Keppra on 08/19/2020 per Dr. Marcello Moores.   BP (!) 147/80 (BP Location: Right Arm, Patient Position: Sitting, Cuff Size: Normal)   Pulse 62   Temp 98.2 F (36.8 C)   Resp 18   Wt 170 lb 6.4 oz (77.3 kg)   SpO2 100%   BMI 25.91 kg/m

## 2020-08-23 ENCOUNTER — Ambulatory Visit
Admission: RE | Admit: 2020-08-23 | Discharge: 2020-08-23 | Disposition: A | Discharge status: Home / Self Care | Payer: Medicare Other | Source: Ambulatory Visit | Attending: Radiation Oncology | Admitting: Radiation Oncology

## 2020-08-23 ENCOUNTER — Other Ambulatory Visit: Payer: Self-pay

## 2020-08-23 VITALS — BP 147/80 | HR 62 | Temp 98.2°F | Resp 18 | Wt 170.4 lb

## 2020-08-23 DIAGNOSIS — Z510 Encounter for antineoplastic radiation therapy: Secondary | ICD-10-CM | POA: Insufficient documentation

## 2020-08-23 DIAGNOSIS — C7931 Secondary malignant neoplasm of brain: Secondary | ICD-10-CM

## 2020-08-23 DIAGNOSIS — C439 Malignant melanoma of skin, unspecified: Secondary | ICD-10-CM | POA: Insufficient documentation

## 2020-08-23 MED ORDER — SODIUM CHLORIDE 0.9% FLUSH
10.0000 mL | Freq: Once | INTRAVENOUS | Status: AC
  Administered 2020-08-23: 10 mL via INTRAVENOUS

## 2020-08-24 ENCOUNTER — Ambulatory Visit: Payer: Medicare Other

## 2020-08-24 ENCOUNTER — Ambulatory Visit: Payer: Medicare Other | Admitting: Radiation Oncology

## 2020-08-24 DIAGNOSIS — C7931 Secondary malignant neoplasm of brain: Secondary | ICD-10-CM | POA: Diagnosis not present

## 2020-08-25 ENCOUNTER — Ambulatory Visit
Admission: RE | Admit: 2020-08-25 | Discharge: 2020-08-25 | Disposition: A | Discharge status: Home / Self Care | Payer: Medicare Other | Source: Ambulatory Visit | Attending: Radiation Oncology | Admitting: Radiation Oncology

## 2020-08-25 ENCOUNTER — Other Ambulatory Visit: Payer: Self-pay

## 2020-08-25 ENCOUNTER — Inpatient Hospital Stay: Payer: Medicare Other | Attending: Oncology | Admitting: *Deleted

## 2020-08-25 DIAGNOSIS — C7940 Secondary malignant neoplasm of unspecified part of nervous system: Secondary | ICD-10-CM | POA: Insufficient documentation

## 2020-08-25 DIAGNOSIS — C439 Malignant melanoma of skin, unspecified: Secondary | ICD-10-CM

## 2020-08-25 DIAGNOSIS — Z7952 Long term (current) use of systemic steroids: Secondary | ICD-10-CM | POA: Insufficient documentation

## 2020-08-25 DIAGNOSIS — Z923 Personal history of irradiation: Secondary | ICD-10-CM | POA: Insufficient documentation

## 2020-08-25 DIAGNOSIS — Z79899 Other long term (current) drug therapy: Secondary | ICD-10-CM | POA: Insufficient documentation

## 2020-08-25 DIAGNOSIS — C7931 Secondary malignant neoplasm of brain: Secondary | ICD-10-CM | POA: Diagnosis not present

## 2020-08-25 NOTE — Op Note (Signed)
Name: Walter Rodriguez    MRN: [id number]   Date: 08/25/2020    DOB: [birth year]   STEREOTACTIC RADIOSURGERY OPERATIVE NOTE  PRE-OPERATIVE DIAGNOSIS:  Metastatic brain disease  POST-OPERATIVE DIAGNOSIS:  Metastatic brain disease  PROCEDURE:  Stereotactic Radiosurgery to postoperative cavity using TrueBeam Linac device, 27 Gy in 3 fractions  SURGEON:  Duffy Rhody, MD  RADIATION ONCOLOGIST: Kyung Rudd, MD  TECHNIQUE:  The patient underwent a radiation treatment planning session in the radiation oncology simulation suite under the care of the radiation oncology physician and physicist.  I participated closely in the radiation treatment planning afterwards. The patient underwent planning CT which was fused to 3T high resolution MRI with 1 mm axial slices.  These images were fused on the planning system.  We contoured the gross target volumes and subsequently expanded this by 1 mm to yield the Planning Target Volume. I actively participated in the planning process.  I helped to define and review the target contours and also the contours of the optic pathway, eyes, brainstem and selected nearby organs at risk.  All the dose constraints for critical structures were reviewed and compared to AAPM Task Group 101.  The prescription dose conformity was reviewed.  I approved the plan electronically.    Accordingly, Walter Rodriguez  was brought to the TrueBeam stereotactic radiation treatment linac and placed in the custom immobilization mask.  The patient was aligned according to the IR fiducial markers with BrainLab Exactrac, then orthogonal x-rays were used in ExacTrac with the 6DOF robotic table and the shifts were made to align the patient  Walter Rodriguez received the first 9 Gy fraction of stereotactic radiosurgery uneventfully.  He will return in subsequent days for the next 2 fractions in similar manner.  The detailed description of the procedure is recorded in the radiation oncology procedure note.  I  was present for the duration of the procedure.  DISPOSITION:   Following delivery, the patient was transported to nursing in stable condition and monitored for possible acute effects to be discharged to home in stable condition with follow-up in one month.  Duffy Rhody, MD [facility]

## 2020-08-25 NOTE — Progress Notes (Signed)
[place] Work  Clinical Social Work met with patient for initial counseling session.  Mr. Lange shared recent treatment experiences and treatment plan moving forward. Patient is feeling positive and confident in treatment plan and team.  Patient states his most helpful form of coping is to "take it one day at a time"- he shares he's been successful in doing so.  CSW and patient discussed possible stressors and support moving forward throughout his cancer experience.  CSW and patient did not schedule ongoing counseling sessions- patient agreed to contact CSW as needed.   [name], LCSW  Clinical Social Worker [facility]

## 2020-08-25 NOTE — Progress Notes (Signed)
Walter Rodriguez rested with [place] for 15 minutes following his SRS treatment.  Patient denies headache, dizziness, nausea, diplopia or ringing in the ears. Denies fatigue. Patient without complaints. Understands to avoid strenuous activity for the next 24 hours and call [phone] with needs.   BP (!) 147/79 (BP Location: Left Arm)   Pulse 64   Temp 98.3 F (36.8 C) (Oral)   Resp 16   SpO2 100%    Walter Rodriguez, BSN

## 2020-08-26 ENCOUNTER — Ambulatory Visit: Payer: Medicare Other | Admitting: Radiation Oncology

## 2020-08-29 ENCOUNTER — Other Ambulatory Visit: Payer: Self-pay

## 2020-08-29 ENCOUNTER — Ambulatory Visit
Admission: RE | Admit: 2020-08-29 | Discharge: 2020-08-29 | Disposition: A | Discharge status: Home / Self Care | Payer: Medicare Other | Source: Ambulatory Visit | Attending: Radiation Oncology | Admitting: Radiation Oncology

## 2020-08-29 DIAGNOSIS — C7931 Secondary malignant neoplasm of brain: Secondary | ICD-10-CM | POA: Diagnosis not present

## 2020-08-29 NOTE — Progress Notes (Signed)
Walter Rodriguez rested with [place] for 15 minutes following his SRS treatment.  Patient denies headache, dizziness, nausea, diplopia or ringing in the ears. Denies fatigue. Patient without complaints. Understands to avoid strenuous activity for the next 24 hours and call [phone] with needs.   BP 139/80   Pulse 60   Temp 97.7 F (36.5 C)   Resp 16   SpO2 100%   LaToya M. Leonie Green, BSN

## 2020-08-31 ENCOUNTER — Encounter: Payer: Self-pay | Admitting: Radiation Oncology

## 2020-08-31 ENCOUNTER — Ambulatory Visit
Admission: RE | Admit: 2020-08-31 | Discharge: 2020-08-31 | Disposition: A | Discharge status: Home / Self Care | Payer: Medicare Other | Source: Ambulatory Visit | Attending: Radiation Oncology | Admitting: Radiation Oncology

## 2020-08-31 ENCOUNTER — Other Ambulatory Visit: Payer: Self-pay

## 2020-08-31 DIAGNOSIS — C7931 Secondary malignant neoplasm of brain: Secondary | ICD-10-CM | POA: Diagnosis not present

## 2020-08-31 NOTE — Progress Notes (Signed)
Mr. Walter Rodriguez rested with [place] for 15 minutes following his SRS treatment.  Patient denies headache, dizziness, nausea, diplopia or ringing in the ears. Denies fatigue. Patient without complaints. Understands to avoid strenuous activity for the next 24 hours and call [phone] with needs.  Patient was given follow-up appointment.   BP 140/80   Pulse (!) 50   Temp 98.1 F (36.7 C)   Resp 18   SpO2 99%   Walter Rodriguez M. Leonie Green, BSN

## 2020-09-07 ENCOUNTER — Inpatient Hospital Stay (HOSPITAL_BASED_OUTPATIENT_CLINIC_OR_DEPARTMENT_OTHER): Payer: Medicare Other | Admitting: Oncology

## 2020-09-07 ENCOUNTER — Other Ambulatory Visit: Payer: Self-pay

## 2020-09-07 VITALS — BP 151/81 | HR 70 | Temp 98.5°F | Resp 18 | Ht 68.0 in | Wt 171.9 lb

## 2020-09-07 DIAGNOSIS — C7940 Secondary malignant neoplasm of unspecified part of nervous system: Secondary | ICD-10-CM | POA: Diagnosis present

## 2020-09-07 DIAGNOSIS — C439 Malignant melanoma of skin, unspecified: Secondary | ICD-10-CM | POA: Diagnosis not present

## 2020-09-07 DIAGNOSIS — Z7952 Long term (current) use of systemic steroids: Secondary | ICD-10-CM | POA: Diagnosis not present

## 2020-09-07 DIAGNOSIS — Z79899 Other long term (current) drug therapy: Secondary | ICD-10-CM | POA: Diagnosis not present

## 2020-09-07 DIAGNOSIS — Z923 Personal history of irradiation: Secondary | ICD-10-CM | POA: Diagnosis not present

## 2020-09-07 MED ORDER — PROCHLORPERAZINE MALEATE 10 MG PO TABS: 10.0000 mg | ORAL_TABLET | Freq: Four times a day (QID) | ORAL | 0 refills | Status: DC | PRN

## 2020-09-07 NOTE — Progress Notes (Signed)
START ON PATHWAY REGIMEN - Melanoma and Other Skin Cancers     A cycle is every 28 days:     Nivolumab   **Always confirm dose/schedule in your pharmacy ordering system**  Patient Characteristics: Melanoma, Cutaneous/Unknown Primary, Distant Metastases or Unresectable Local Recurrence, Resected, BRAF V600  Wild Type / BRAF V600 Results Pending or Unknown Disease Classification: Melanoma Disease Subtype: Cutaneous BRAF V600 Mutation Status: Did Not Order BRAF V600 Test Therapeutic Status: Distant Metastases Intent of Therapy: Curative Intent, Discussed with Patient

## 2020-09-07 NOTE — Progress Notes (Signed)
Hematology and Oncology Follow Up Visit  Walter Rodriguez [id number] May 27, 1956 64 y.o. 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     Prior Therapy:  He is status post local wide excision as well as right axillary sentinel lymph node sampling completed by Dr. Barry Rodriguez on April 01, 2019.  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 Iran Rodriguez on Apr 10, 2019.  He is status post right craniotomy and excision of brain tumor completed on August 05, 2020 by Dr. Marcello Rodriguez.  He status post radiation therapy following his craniotomy completed on August 25, 2020.  He received 27 Gy in 3 fractions utilizing stereotactic radiosurgery.   Current therapy:   Interim History: Mr. Tissue presents today for a follow-up visit.  Since the last visit, he completed the stereotactic radiosurgery without any complications.  He has been tapered off steroids completely without any residual effects.  He denies any nausea vomiting or abdominal pain.  He denies any weight loss or appetite changes.  He denies any recent hospitalization or illnesses.   Medications: Updated on review. Current Outpatient Medications  Medication Sig Dispense Refill  . acetaminophen (TYLENOL) 500 MG tablet Take 500 mg by mouth every 6 (six) hours as needed for headache.    . dexamethasone (DECADRON) 2 MG tablet Take 1 tablet (2 mg total) by mouth 2 (two) times daily. 14 tablet 0  . HYDROcodone-acetaminophen (NORCO/VICODIN) 5-325 MG tablet Take 1 tablet by mouth every 6 (six) hours as needed for moderate pain. (Patient not taking: Reported on 08/11/2020) 20 tablet 0  . levETIRAcetam (KEPPRA) 500 MG tablet Take 1 tablet (500 mg total) by  mouth 2 (two) times daily. 60 tablet 0  . Multiple Vitamin (MULTI-VITAMIN) tablet Take 1 tablet by mouth daily.     No current facility-administered medications for this visit.     Allergies: No Known Allergies      Physical Exam: Blood pressure (!) 151/81, pulse 70, temperature 98.5 F (36.9 C), temperature source Tympanic, resp. rate 18, height 5\' 8"  (1.727 m), weight 171 lb 14.4 oz (78 kg), SpO2 99 %.   ECOG: 0     General appearance: Alert, awake without any distress. Head: Atraumatic without abnormalities Oropharynx: Without any thrush or ulcers. Eyes: No scleral icterus. Lymph nodes: No lymphadenopathy noted in the cervical, supraclavicular, or axillary nodes Heart:regular rate and rhythm, without any murmurs or gallops.   Lung: Clear to auscultation without any rhonchi, wheezes or dullness to percussion. Abdomin: Soft, nontender without any shifting dullness or ascites. Musculoskeletal: No clubbing or cyanosis. Neurological: No motor or sensory deficits. Skin: No rashes or lesions.        Lab Results: Lab Results  Component Value Date   WBC 12.2 (H) 08/07/2020   HGB 13.4 08/07/2020   HCT 40.9 08/07/2020   MCV 82.6 08/07/2020   PLT 230 08/07/2020     Chemistry      Component Value Date/Time   NA 137 08/07/2020 0429   K 4.4 08/07/2020 0429   CL 107 08/07/2020 0429   CO2 23 08/07/2020 0429   BUN 26 (H) 08/07/2020 0429   CREATININE 1.05 08/07/2020 0429   CREATININE 1.19 07/27/2020 0836  Component Value Date/Time   CALCIUM 8.3 (L) 08/07/2020 0429   ALKPHOS 41 08/07/2020 0429   AST 22 08/07/2020 0429   AST 20 08/14/2019 0735   ALT 31 08/07/2020 0429   ALT 17 08/14/2019 0735   BILITOT 0.8 08/07/2020 0429   BILITOT 0.5 08/14/2019 4776      64 year old with:  1.  Stage IV melanoma with CNS metastasis that has been resected.  He was initially diagnosed with nodular melanoma of the skin with pathological staging of T4bN0.     Treatment  options moving forward were reviewed including adjuvant immunotherapy versus combination immunotherapy to treat stage IV disease.  Given his resected disease I have recommended proceeding with a single agent nivolumab for adjuvant purposes on a monthly basis for 12 months.  Complications associated with this therapy include nausea, fatigue and immune mediated complications.  After discussion today he is agreeable to proceed after therapy education class.  2.  CNS metastasis: He has completed surgical resection followed by radiation therapy.  He will have repeat imaging studies in December 2021.  3.  Dermatology surveillance: I recommend dermatology surveillance at this time.  4.  Immune mediated complications.  These include pneumonitis, colitis, hepatitis or thyroid disease were reiterated.  We will continue to monitor during his treatment.  5.  Antiemetics: Prescription for Compazine will be made available to him.  6.  IV access: Risks and benefits of a Port-A-Cath insertion were discussed.  He deferred that option and peripheral veins will be used in the time being.  7  Follow-up: He will return in the near future to start nivolumab adjuvant therapy.   30  minutes were spent on this encounter.  The time was dedicated to reviewing his disease status, treatment options and outlining future plan of care.      Zola Button, MD 9/29/20213:28 PM

## 2020-09-13 ENCOUNTER — Ambulatory Visit: Payer: Medicare Other | Attending: Internal Medicine

## 2020-09-13 DIAGNOSIS — Z23 Encounter for immunization: Secondary | ICD-10-CM

## 2020-09-13 NOTE — Progress Notes (Signed)
   Covid-19 Vaccination Clinic  Name:  Walter Rodriguez    MRN: [id number] DOB: [birth year]  09/13/2020  [name] was observed post Covid-19 immunization for 15 minutes without incident. He was provided with Vaccine Information Sheet and instruction to access the V-Safe system.   [name] was instructed to call [phone] with any severe reactions post vaccine: Marland Kitchen Difficulty breathing  . Swelling of face and throat  . A fast heartbeat  . A bad rash all over body  . Dizziness and weakness

## 2020-09-15 ENCOUNTER — Inpatient Hospital Stay: Payer: Medicare Other | Attending: Oncology

## 2020-09-15 ENCOUNTER — Other Ambulatory Visit: Payer: Self-pay

## 2020-09-15 DIAGNOSIS — Z79899 Other long term (current) drug therapy: Secondary | ICD-10-CM | POA: Insufficient documentation

## 2020-09-15 DIAGNOSIS — Z5112 Encounter for antineoplastic immunotherapy: Secondary | ICD-10-CM | POA: Insufficient documentation

## 2020-09-15 DIAGNOSIS — C439 Malignant melanoma of skin, unspecified: Secondary | ICD-10-CM | POA: Insufficient documentation

## 2020-09-15 DIAGNOSIS — C7931 Secondary malignant neoplasm of brain: Secondary | ICD-10-CM | POA: Insufficient documentation

## 2020-09-20 NOTE — Progress Notes (Signed)
  Radiation Oncology         (336) [phone] ________________________________  Name: Walter Rodriguez MRN: [id number]  Date: 08/31/2020  DOB: 1956-07-19  End of Treatment Note  Diagnosis:   metastatic melanoma with brain metastasis     Indication for treatment:  palliative       Radiation treatment dates:   08/25/20 - 08/31/20  Site/dose:   [id number]:   27 Gy in 3 fractions ExacTrac, 3 VMAT beams max dose=131.3%   Narrative: The patient tolerated radiation treatment well.   There were no signs of acute toxicity after treatment.  Plan: The patient has completed radiation treatment. The patient will return to radiation oncology clinic for routine followup in one month. I advised the patient to call or return sooner if they have any questions or concerns related to their recovery or treatment. ________________________________  ------------------------------------------------  Jodelle Gross, MD, PhD

## 2020-09-21 ENCOUNTER — Encounter: Payer: Self-pay | Admitting: Oncology

## 2020-09-21 NOTE — Progress Notes (Signed)
Called pt to introduce myself as his Arboriculturist, discuss copay assistance and the [employer].  Pt gave me consent to apply in his behalf so I applied to the [employer] [place] and he was approved for $8,500 for 12 months from10/13/21.  I also informed him of the [employer] and went over what it covers.  Pt would like to apply so he will bring his proof of income on 09/22/20.  If approved I will give him an expense sheet and my card for any questions or concerns he may have in the future.

## 2020-09-22 ENCOUNTER — Encounter: Payer: Self-pay | Admitting: Oncology

## 2020-09-22 ENCOUNTER — Other Ambulatory Visit: Payer: Self-pay

## 2020-09-22 ENCOUNTER — Inpatient Hospital Stay: Payer: Medicare Other

## 2020-09-22 VITALS — BP 161/75 | HR 54 | Temp 98.1°F | Resp 18

## 2020-09-22 DIAGNOSIS — C439 Malignant melanoma of skin, unspecified: Secondary | ICD-10-CM

## 2020-09-22 DIAGNOSIS — Z79899 Other long term (current) drug therapy: Secondary | ICD-10-CM | POA: Diagnosis not present

## 2020-09-22 DIAGNOSIS — C7931 Secondary malignant neoplasm of brain: Secondary | ICD-10-CM | POA: Diagnosis not present

## 2020-09-22 DIAGNOSIS — Z5112 Encounter for antineoplastic immunotherapy: Secondary | ICD-10-CM | POA: Diagnosis not present

## 2020-09-22 LAB — CBC WITH DIFFERENTIAL (CANCER CENTER ONLY)
Abs Immature Granulocytes: 0.02 10*3/uL (ref 0.00–0.07)
Basophils Absolute: 0.1 10*3/uL (ref 0.0–0.1)
Basophils Relative: 1 %
Eosinophils Absolute: 0.1 10*3/uL (ref 0.0–0.5)
Eosinophils Relative: 1 %
HCT: 45.2 % (ref 39.0–52.0)
Hemoglobin: 14.5 g/dL (ref 13.0–17.0)
Immature Granulocytes: 0 %
Lymphocytes Relative: 24 %
Lymphs Abs: 1.6 10*3/uL (ref 0.7–4.0)
MCH: 27.1 pg (ref 26.0–34.0)
MCHC: 32.1 g/dL (ref 30.0–36.0)
MCV: 84.5 fL (ref 80.0–100.0)
Monocytes Absolute: 0.4 10*3/uL (ref 0.1–1.0)
Monocytes Relative: 6 %
Neutro Abs: 4.6 10*3/uL (ref 1.7–7.7)
Neutrophils Relative %: 68 %
Platelet Count: 225 10*3/uL (ref 150–400)
RBC: 5.35 MIL/uL (ref 4.22–5.81)
RDW: 13.3 % (ref 11.5–15.5)
WBC Count: 6.8 10*3/uL (ref 4.0–10.5)
nRBC: 0 % (ref 0.0–0.2)

## 2020-09-22 LAB — CMP (CANCER CENTER ONLY)
ALT: 16 U/L (ref 0–44)
AST: 18 U/L (ref 15–41)
Albumin: 3.9 g/dL (ref 3.5–5.0)
Alkaline Phosphatase: 57 U/L (ref 38–126)
Anion gap: 5 (ref 5–15)
BUN: 14 mg/dL (ref 8–23)
CO2: 29 mmol/L (ref 22–32)
Calcium: 9.7 mg/dL (ref 8.9–10.3)
Chloride: 107 mmol/L (ref 98–111)
Creatinine: 1.13 mg/dL (ref 0.61–1.24)
GFR, Estimated: >60 mL/min (ref >60)
Glucose, Bld: 82 mg/dL (ref 70–99)
Potassium: 4.5 mmol/L (ref 3.5–5.1)
Sodium: 141 mmol/L (ref 135–145)
Total Bilirubin: 0.6 mg/dL (ref 0.3–1.2)
Total Protein: 7.1 g/dL (ref 6.5–8.1)

## 2020-09-22 LAB — TSH: TSH: 2.034 u[IU]/mL (ref 0.320–4.118)

## 2020-09-22 MED ORDER — HEPARIN SOD (PORK) LOCK FLUSH 100 UNIT/ML IV SOLN
500.0000 [IU] | Freq: Once | INTRAVENOUS | Status: DC | PRN
  Filled 2020-09-22: qty 5

## 2020-09-22 MED ORDER — SODIUM CHLORIDE 0.9 % IV SOLN
480.0000 mg | Freq: Once | INTRAVENOUS | Status: AC
  Administered 2020-09-22: 480 mg via INTRAVENOUS
  Filled 2020-09-22: qty 48

## 2020-09-22 MED ORDER — SODIUM CHLORIDE 0.9% FLUSH
10.0000 mL | INTRAVENOUS | Status: DC | PRN
  Filled 2020-09-22: qty 10

## 2020-09-22 MED ORDER — SODIUM CHLORIDE 0.9 % IV SOLN
Freq: Once | INTRAVENOUS | Status: AC
  Filled 2020-09-22: qty 250

## 2020-09-22 NOTE — Progress Notes (Signed)
Pt is approved for the $1000 [employer] grant.  

## 2020-09-22 NOTE — Patient Instructions (Signed)
[facility] Discharge Instructions for Patients Receiving Chemotherapy  Today you received the following chemotherapy agents Nivolumab  To help prevent nausea and vomiting after your treatment, we encourage you to take your nausea medication as directed.  If you develop nausea and vomiting that is not controlled by your nausea medication, call the clinic.   BELOW ARE SYMPTOMS THAT SHOULD BE REPORTED IMMEDIATELY:  *FEVER GREATER THAN 100.5 F  *CHILLS WITH OR WITHOUT FEVER  NAUSEA AND VOMITING THAT IS NOT CONTROLLED WITH YOUR NAUSEA MEDICATION  *UNUSUAL SHORTNESS OF BREATH  *UNUSUAL BRUISING OR BLEEDING  TENDERNESS IN MOUTH AND THROAT WITH OR WITHOUT PRESENCE OF ULCERS  *URINARY PROBLEMS  *BOWEL PROBLEMS  UNUSUAL RASH Items with * indicate a potential emergency and should be followed up as soon as possible.  Feel free to call the clinic should you have any questions or concerns. The clinic phone number is [phone].  Please show the CHEMO ALERT CARD at check-in to the Emergency Department and triage nurse.  Nivolumab injection What is this medicine? NIVOLUMAB (nye VOL ue mab) is a monoclonal antibody. It is used to treat colon cancer, esophageal cancer, head and neck cancer, Hodgkin lymphoma, kidney cancer, liver cancer, lung cancer, mesothelioma, melanoma, and urothelial cancer. This medicine may be used for other purposes; ask your health care provider or pharmacist if you have questions. COMMON BRAND NAME(S): Opdivo What should I tell my health care provider before I take this medicine? They need to know if you have any of these conditions:  diabetes  immune system problems  kidney disease  liver disease  lung disease  organ transplant  stomach or intestine problems  thyroid disease  an unusual or allergic reaction to nivolumab, other medicines, foods, dyes, or preservatives  pregnant or trying to get pregnant  breast-feeding How  should I use this medicine? This medicine is for infusion into a vein. It is given by a health care professional in a hospital or clinic setting. A special MedGuide will be given to you before each treatment. Be sure to read this information carefully each time. Talk to your pediatrician regarding the use of this medicine in children. While this drug may be prescribed for children as young as 12 years for selected conditions, precautions do apply. Overdosage: If you think you have taken too much of this medicine contact a poison control center or emergency room at once. NOTE: This medicine is only for you. Do not share this medicine with others. What if I miss a dose? It is important not to miss your dose. Call your doctor or health care professional if you are unable to keep an appointment. What may interact with this medicine? Interactions have not been studied. Give your health care provider a list of all the medicines, herbs, non-prescription drugs, or dietary supplements you use. Also tell them if you smoke, drink alcohol, or use illegal drugs. Some items may interact with your medicine. This list may not describe all possible interactions. Give your health care provider a list of all the medicines, herbs, non-prescription drugs, or dietary supplements you use. Also tell them if you smoke, drink alcohol, or use illegal drugs. Some items may interact with your medicine. What should I watch for while using this medicine? This drug may make you feel generally unwell. Continue your course of treatment even though you feel ill unless your doctor tells you to stop. You may need blood work done while you are taking this medicine. Do   not become pregnant while taking this medicine or for 5 months after stopping it. Women should inform their doctor if they wish to become pregnant or think they might be pregnant. There is a potential for serious side effects to an unborn child. Talk to your health care  professional or pharmacist for more information. Do not breast-feed an infant while taking this medicine or for 5 months after stopping it. What side effects may I notice from receiving this medicine? Side effects that you should report to your doctor or health care professional as soon as possible:  allergic reactions like skin rash, itching or hives, swelling of the face, lips, or tongue  breathing problems  blood in the urine  bloody or watery diarrhea or black, tarry stools  changes in emotions or moods  changes in vision  chest pain  cough  dizziness  feeling faint or lightheaded, falls  fever, chills  headache with fever, neck stiffness, confusion, loss of memory, sensitivity to light, hallucination, loss of contact with reality, or seizures  joint pain  mouth sores  redness, blistering, peeling or loosening of the skin, including inside the mouth  severe muscle pain or weakness  signs and symptoms of high blood sugar such as dizziness; dry mouth; dry skin; fruity breath; nausea; stomach pain; increased hunger or thirst; increased urination  signs and symptoms of kidney injury like trouble passing urine or change in the amount of urine  signs and symptoms of liver injury like dark yellow or brown urine; general ill feeling or flu-like symptoms; light-colored stools; loss of appetite; nausea; right upper belly pain; unusually weak or tired; yellowing of the eyes or skin  swelling of the ankles, feet, hands  trouble passing urine or change in the amount of urine  unusually weak or tired  weight gain or loss Side effects that usually do not require medical attention (report to your doctor or health care professional if they continue or are bothersome):  bone pain  constipation  decreased appetite  diarrhea  muscle pain  nausea, vomiting  tiredness This list may not describe all possible side effects. Call your doctor for medical advice about side  effects. You may report side effects to FDA at 1-800-FDA-1088. Where should I keep my medicine? This drug is given in a hospital or clinic and will not be stored at home. NOTE: This sheet is a summary. It may not cover all possible information. If you have questions about this medicine, talk to your doctor, pharmacist, or health care provider.  2020 Elsevier/Gold Standard (2019-09-15 10:04:50)  

## 2020-09-26 ENCOUNTER — Ambulatory Visit
Admission: RE | Admit: 2020-09-26 | Discharge: 2020-09-26 | Disposition: A | Discharge status: Home / Self Care | Payer: Medicare Other | Source: Ambulatory Visit | Attending: Radiation Oncology | Admitting: Radiation Oncology

## 2020-09-26 ENCOUNTER — Other Ambulatory Visit: Payer: Self-pay

## 2020-09-26 DIAGNOSIS — C439 Malignant melanoma of skin, unspecified: Secondary | ICD-10-CM | POA: Insufficient documentation

## 2020-09-26 DIAGNOSIS — D496 Neoplasm of unspecified behavior of brain: Secondary | ICD-10-CM | POA: Insufficient documentation

## 2020-09-26 NOTE — Progress Notes (Signed)
  Radiation Oncology         (336) [phone] ________________________________  Name: Walter Rodriguez MRN: [id number]  Date of Service: 09/26/2020  DOB: [birth year]  Post Treatment Telephone Note  Diagnosis:   Metastatic melanoma to the brain  Interval Since Last Radiation:  4  weeks   08/25/20 - 08/31/20 Postop SRS Treatment: [id number]:   27 Gy in 3 fractions ExacTrac, 3 VMAT   Narrative:  The patient was contacted today for routine follow-up. During treatment he did very well with radiotherapy and did not have significant desquamation. He reports he is doing well. He tolerated radiotherapy well and has a patchy area of hair loss along the right scalp. He denies any headaches or dizziness. No other complaints are noted and he reports that he recently saw Dr. Marcello Moores and he was pleased with the patient's healing.  Impression/Plan: 1.  Metastatic melanoma to the brain. The patient has been doing well since completion of radiotherapy. We discussed that we would be happy to continue to follow him as needed, but he will also continue to follow up with Dr. [name] in neuro oncology and with Dr. Marcello Moores in Neurosurgery. He will continue immunotherapy with Dr. Alen Blew in medical oncology over the long term.     Carola Rhine, PAC

## 2020-09-27 ENCOUNTER — Other Ambulatory Visit: Payer: Self-pay | Admitting: Radiation Therapy

## 2020-09-27 ENCOUNTER — Ambulatory Visit: Payer: Medicare Other | Admitting: Radiation Oncology

## 2020-09-27 DIAGNOSIS — C7931 Secondary malignant neoplasm of brain: Secondary | ICD-10-CM

## 2020-09-28 ENCOUNTER — Ambulatory Visit: Payer: Medicare Other | Admitting: Neurology

## 2020-10-20 ENCOUNTER — Other Ambulatory Visit: Payer: Self-pay

## 2020-10-20 ENCOUNTER — Inpatient Hospital Stay: Payer: Medicare Other

## 2020-10-20 ENCOUNTER — Inpatient Hospital Stay: Payer: Medicare Other | Admitting: Oncology

## 2020-10-20 ENCOUNTER — Inpatient Hospital Stay: Payer: Medicare Other | Attending: Oncology

## 2020-10-20 VITALS — BP 146/72 | HR 62 | Temp 98.3°F | Resp 18 | Ht 68.0 in | Wt 170.5 lb

## 2020-10-20 DIAGNOSIS — C439 Malignant melanoma of skin, unspecified: Secondary | ICD-10-CM

## 2020-10-20 DIAGNOSIS — Z79899 Other long term (current) drug therapy: Secondary | ICD-10-CM | POA: Insufficient documentation

## 2020-10-20 DIAGNOSIS — C7940 Secondary malignant neoplasm of unspecified part of nervous system: Secondary | ICD-10-CM | POA: Diagnosis present

## 2020-10-20 DIAGNOSIS — Z923 Personal history of irradiation: Secondary | ICD-10-CM | POA: Diagnosis not present

## 2020-10-20 DIAGNOSIS — Z5112 Encounter for antineoplastic immunotherapy: Secondary | ICD-10-CM | POA: Diagnosis present

## 2020-10-20 DIAGNOSIS — C4359 Malignant melanoma of other part of trunk: Secondary | ICD-10-CM | POA: Insufficient documentation

## 2020-10-20 LAB — CBC WITH DIFFERENTIAL (CANCER CENTER ONLY)
Abs Immature Granulocytes: 0.02 10*3/uL (ref 0.00–0.07)
Basophils Absolute: 0.1 10*3/uL (ref 0.0–0.1)
Basophils Relative: 1 %
Eosinophils Absolute: 0.2 10*3/uL (ref 0.0–0.5)
Eosinophils Relative: 2 %
HCT: 44.1 % (ref 39.0–52.0)
Hemoglobin: 14.6 g/dL (ref 13.0–17.0)
Immature Granulocytes: 0 %
Lymphocytes Relative: 21 %
Lymphs Abs: 1.7 10*3/uL (ref 0.7–4.0)
MCH: 27.8 pg (ref 26.0–34.0)
MCHC: 33.1 g/dL (ref 30.0–36.0)
MCV: 84 fL (ref 80.0–100.0)
Monocytes Absolute: 0.5 10*3/uL (ref 0.1–1.0)
Monocytes Relative: 6 %
Neutro Abs: 5.5 10*3/uL (ref 1.7–7.7)
Neutrophils Relative %: 70 %
Platelet Count: 249 10*3/uL (ref 150–400)
RBC: 5.25 MIL/uL (ref 4.22–5.81)
RDW: 13.2 % (ref 11.5–15.5)
WBC Count: 8 10*3/uL (ref 4.0–10.5)
nRBC: 0 % (ref 0.0–0.2)

## 2020-10-20 LAB — CMP (CANCER CENTER ONLY)
ALT: 18 U/L (ref 0–44)
AST: 21 U/L (ref 15–41)
Albumin: 3.9 g/dL (ref 3.5–5.0)
Alkaline Phosphatase: 60 U/L (ref 38–126)
Anion gap: 8 (ref 5–15)
BUN: 17 mg/dL (ref 8–23)
CO2: 26 mmol/L (ref 22–32)
Calcium: 9.3 mg/dL (ref 8.9–10.3)
Chloride: 106 mmol/L (ref 98–111)
Creatinine: 1.11 mg/dL (ref 0.61–1.24)
GFR, Estimated: >60 mL/min (ref >60)
Glucose, Bld: 96 mg/dL (ref 70–99)
Potassium: 4.2 mmol/L (ref 3.5–5.1)
Sodium: 140 mmol/L (ref 135–145)
Total Bilirubin: 0.6 mg/dL (ref 0.3–1.2)
Total Protein: 6.9 g/dL (ref 6.5–8.1)

## 2020-10-20 LAB — TSH: TSH: 1.548 u[IU]/mL (ref 0.320–4.118)

## 2020-10-20 MED ORDER — SODIUM CHLORIDE 0.9 % IV SOLN
480.0000 mg | Freq: Once | INTRAVENOUS | Status: AC
  Administered 2020-10-20: 480 mg via INTRAVENOUS
  Filled 2020-10-20: qty 48

## 2020-10-20 MED ORDER — SODIUM CHLORIDE 0.9 % IV SOLN
Freq: Once | INTRAVENOUS | Status: AC
  Filled 2020-10-20: qty 250

## 2020-10-20 NOTE — Progress Notes (Signed)
Hematology and Oncology Follow Up Visit  Walter Rodriguez [id number] 1956-08-06 64 y.o. 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   Prior Therapy:  He is status post local wide excision as well as right axillary sentinel lymph node sampling completed by Dr. Barry Rodriguez on April 01, 2019.  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 Iran Rodriguez on Apr 10, 2019.  He is status post right craniotomy and excision of brain tumor completed on August 05, 2020 by Dr. Marcello Rodriguez.  He status post radiation therapy following his craniotomy completed on August 25, 2020.  He received 27 Gy in 3 fractions utilizing stereotactic radiosurgery.   Current therapy: Nivolumab 480 mg every 4 weeks started on September 22, 2020.  He is here for cycle 2 of therapy.  Interim History: Walter Rodriguez returns today for repeat evaluation.  Since the last visit, he reports no major changes in his health.  He tolerated the last cycle of nivolumab without any major complaints.  He denies any nausea, fatigue or dyspnea on exertion.  He denies any skin rashes or lesions.  Performance status quality of life remained excellent.   Medications: Unchanged on review. Current Outpatient Medications  Medication Sig Dispense Refill  . Multiple Vitamin (MULTI-VITAMIN) tablet Take 1 tablet by mouth daily.    . prochlorperazine (COMPAZINE) 10 MG tablet Take 1 tablet (10 mg total) by mouth every 6 (six) hours as needed for nausea or vomiting. 30 tablet 0   No current facility-administered medications for this visit.     Allergies: No Known Allergies      Physical Exam:  Blood pressure (!) 146/72, pulse 62, temperature 98.3 F (36.8 C), temperature source Tympanic, resp. rate  18, height 5\' 8"  (1.727 m), weight 170 lb 8 oz (77.3 kg), SpO2 100 %.  Walter Rodriguez's: pH of  ECOG: 0   General appearance: Comfortable appearing without any discomfort Head: Normocephalic without any trauma Oropharynx: Mucous membranes are moist and pink without any thrush or ulcers. Eyes: Pupils are equal and round reactive to light. Lymph nodes: No cervical, supraclavicular, inguinal or axillary lymphadenopathy.   Heart:regular rate and rhythm.  S1 and S2 without leg edema. Lung: Clear without any rhonchi or wheezes.  No dullness to percussion. Abdomin: Soft, nontender, nondistended with good bowel sounds.  No hepatosplenomegaly. Musculoskeletal: No joint deformity or effusion.  Full range of motion noted. Neurological: No deficits noted on motor, sensory and deep tendon reflex exam. Skin: No petechial rash or dryness.  Appeared moist.          Lab Results: Lab Results  Component Value Date   WBC 6.8 09/22/2020   HGB 14.5 09/22/2020   HCT 45.2 09/22/2020   MCV 84.5 09/22/2020   PLT 225 09/22/2020     Chemistry      Component Value Date/Time   NA 141 09/22/2020 0744   K 4.5 09/22/2020 0744   CL 107 09/22/2020 0744   CO2 29 09/22/2020 0744   BUN 14 09/22/2020 0744   CREATININE 1.13 09/22/2020 0744   CREATININE 1.19 07/27/2020 0836      Component Value Date/Time   CALCIUM 9.7 09/22/2020 0744   ALKPHOS 57 09/22/2020 0744   AST 18 09/22/2020 0744   ALT 16 09/22/2020  0744   BILITOT 0.6 09/22/2020 10230        64 year old with:  1.  Cutaneous melanoma of the upper back, diagnosed and 2020.  He subsequently developed stage IV disease with CNS involvement.   He is currently receiving adjuvant nivolumab therapy after his stage IV resected disease.  Risks and benefits of continuing this treatment long-term to complete 12 cycles were reviewed.  Complications that include autoimmune complications, GI toxicity and dermatitis were reviewed.  He is agreeable to  continue.  2.  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  Follow-up MRI is scheduled in December.  3.  Dermatology surveillance: No evidence of any new skin lesions.  I recommended follow-up with dermatology.  4.  Immune mediated complications.  I continue to educate him about potential complications including thyroid disease, hypophysitis, pneumonitis, colitis and thyroid disease  5.  Antiemetics: Compazine is available to him without any nausea or vomiting.  6.  IV access: Peripheral veins are currently in use without any issues.  7  Follow-up: In 4 weeks for next cycle of therapy.   30  minutes were dedicated to this encounter.  The time spent on reviewing disease status, discussing treatment options and future plan of care review.      Zola Button, MD 11/11/202111:22 AM

## 2020-10-20 NOTE — Patient Instructions (Signed)
[facility] Discharge Instructions for Patients Receiving Chemotherapy  Today you received the following chemotherapy agents Opdivo  To help prevent nausea and vomiting after your treatment, we encourage you to take your nausea medication as directed   If you develop nausea and vomiting that is not controlled by your nausea medication, call the clinic.   BELOW ARE SYMPTOMS THAT SHOULD BE REPORTED IMMEDIATELY:  *FEVER GREATER THAN 100.5 F  *CHILLS WITH OR WITHOUT FEVER  NAUSEA AND VOMITING THAT IS NOT CONTROLLED WITH YOUR NAUSEA MEDICATION  *UNUSUAL SHORTNESS OF BREATH  *UNUSUAL BRUISING OR BLEEDING  TENDERNESS IN MOUTH AND THROAT WITH OR WITHOUT PRESENCE OF ULCERS  *URINARY PROBLEMS  *BOWEL PROBLEMS  UNUSUAL RASH Items with * indicate a potential emergency and should be followed up as soon as possible.  Feel free to call the clinic should you have any questions or concerns. The clinic phone number is [phone].  Please show the CHEMO ALERT CARD at check-in to the Emergency Department and triage nurse.   

## 2020-10-25 ENCOUNTER — Other Ambulatory Visit: Payer: Medicare Other

## 2020-11-17 ENCOUNTER — Inpatient Hospital Stay: Payer: Medicare Other

## 2020-11-17 ENCOUNTER — Inpatient Hospital Stay (HOSPITAL_BASED_OUTPATIENT_CLINIC_OR_DEPARTMENT_OTHER): Payer: Medicare Other | Admitting: Oncology

## 2020-11-17 ENCOUNTER — Other Ambulatory Visit: Payer: Medicare Other

## 2020-11-17 ENCOUNTER — Other Ambulatory Visit: Payer: Self-pay

## 2020-11-17 ENCOUNTER — Inpatient Hospital Stay: Payer: Medicare Other | Attending: Oncology

## 2020-11-17 VITALS — BP 141/67 | HR 64 | Temp 97.8°F | Resp 14 | Ht 68.0 in | Wt 170.6 lb

## 2020-11-17 DIAGNOSIS — Z5112 Encounter for antineoplastic immunotherapy: Secondary | ICD-10-CM | POA: Insufficient documentation

## 2020-11-17 DIAGNOSIS — C7931 Secondary malignant neoplasm of brain: Secondary | ICD-10-CM | POA: Insufficient documentation

## 2020-11-17 DIAGNOSIS — C439 Malignant melanoma of skin, unspecified: Secondary | ICD-10-CM

## 2020-11-17 DIAGNOSIS — Z923 Personal history of irradiation: Secondary | ICD-10-CM | POA: Diagnosis not present

## 2020-11-17 DIAGNOSIS — Z79899 Other long term (current) drug therapy: Secondary | ICD-10-CM | POA: Diagnosis not present

## 2020-11-17 DIAGNOSIS — Z9221 Personal history of antineoplastic chemotherapy: Secondary | ICD-10-CM | POA: Insufficient documentation

## 2020-11-17 DIAGNOSIS — R29818 Other symptoms and signs involving the nervous system: Secondary | ICD-10-CM | POA: Diagnosis not present

## 2020-11-17 LAB — CMP (CANCER CENTER ONLY)
ALT: 18 U/L (ref 0–44)
AST: 20 U/L (ref 15–41)
Albumin: 3.9 g/dL (ref 3.5–5.0)
Alkaline Phosphatase: 59 U/L (ref 38–126)
Anion gap: 11 (ref 5–15)
BUN: 20 mg/dL (ref 8–23)
CO2: 23 mmol/L (ref 22–32)
Calcium: 9.2 mg/dL (ref 8.9–10.3)
Chloride: 107 mmol/L (ref 98–111)
Creatinine: 1.1 mg/dL (ref 0.61–1.24)
GFR, Estimated: >60 mL/min (ref >60)
Glucose, Bld: 97 mg/dL (ref 70–99)
Potassium: 4.1 mmol/L (ref 3.5–5.1)
Sodium: 141 mmol/L (ref 135–145)
Total Bilirubin: 0.6 mg/dL (ref 0.3–1.2)
Total Protein: 7.1 g/dL (ref 6.5–8.1)

## 2020-11-17 LAB — CBC WITH DIFFERENTIAL (CANCER CENTER ONLY)
Abs Immature Granulocytes: 0.01 10*3/uL (ref 0.00–0.07)
Basophils Absolute: 0.1 10*3/uL (ref 0.0–0.1)
Basophils Relative: 1 %
Eosinophils Absolute: 0.1 10*3/uL (ref 0.0–0.5)
Eosinophils Relative: 2 %
HCT: 42.9 % (ref 39.0–52.0)
Hemoglobin: 14.4 g/dL (ref 13.0–17.0)
Immature Granulocytes: 0 %
Lymphocytes Relative: 21 %
Lymphs Abs: 1.5 10*3/uL (ref 0.7–4.0)
MCH: 27.6 pg (ref 26.0–34.0)
MCHC: 33.6 g/dL (ref 30.0–36.0)
MCV: 82.2 fL (ref 80.0–100.0)
Monocytes Absolute: 0.4 10*3/uL (ref 0.1–1.0)
Monocytes Relative: 6 %
Neutro Abs: 5 10*3/uL (ref 1.7–7.7)
Neutrophils Relative %: 70 %
Platelet Count: 240 10*3/uL (ref 150–400)
RBC: 5.22 MIL/uL (ref 4.22–5.81)
RDW: 13.1 % (ref 11.5–15.5)
WBC Count: 7.1 10*3/uL (ref 4.0–10.5)
nRBC: 0 % (ref 0.0–0.2)

## 2020-11-17 LAB — TSH: TSH: 1.407 u[IU]/mL (ref 0.320–4.118)

## 2020-11-17 MED ORDER — HEPARIN SOD (PORK) LOCK FLUSH 100 UNIT/ML IV SOLN
500.0000 [IU] | Freq: Once | INTRAVENOUS | Status: DC | PRN
  Filled 2020-11-17: qty 5

## 2020-11-17 MED ORDER — SODIUM CHLORIDE 0.9% FLUSH
10.0000 mL | INTRAVENOUS | Status: DC | PRN
  Filled 2020-11-17: qty 10

## 2020-11-17 MED ORDER — SODIUM CHLORIDE 0.9 % IV SOLN
480.0000 mg | Freq: Once | INTRAVENOUS | Status: AC
  Administered 2020-11-17: 480 mg via INTRAVENOUS
  Filled 2020-11-17: qty 48

## 2020-11-17 MED ORDER — SODIUM CHLORIDE 0.9 % IV SOLN
Freq: Once | INTRAVENOUS | Status: AC
  Filled 2020-11-17: qty 250

## 2020-11-17 NOTE — Progress Notes (Signed)
Patient discharged in stable condition with no complaints 

## 2020-11-17 NOTE — Progress Notes (Signed)
Hematology and Oncology Follow Up Visit  Walter Rodriguez [id number] 26-Feb-1956 64 y.o. 11/17/2020 11:05 AM Walter Rodriguez, MDWhite, Walter Griffins, MD   Principle Diagnosis: 64 year old man with stage IV melanoma with CNS involvement documented in August 2020.  He initially presented with T4N0 cutaneous melanoma and April 2021.  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   Prior Therapy:  He is status post local wide excision as well as right axillary sentinel lymph node sampling completed by Dr. Barry Dienes on April 01, 2019.  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 Iran Planas on Apr 10, 2019.  He is status post right craniotomy and excision of brain tumor completed on August 05, 2020 by Dr. Marcello Moores.  He status post radiation therapy following his craniotomy completed on August 25, 2020.  He received 27 Gy in 3 fractions utilizing stereotactic radiosurgery.   Current therapy: Nivolumab 480 mg every 4 weeks started on September 22, 2020.  He is here for cycle 3 of therapy.  Interim History: Walter Rodriguez is here for a follow-up visit.  Since the last visit, he reports no major changes in his health.  He continues to tolerate nivolumab without any complaints.  He did have a few skin lesions on his face and chest that required topical cream by his dermatologist.  He denies any recent hospitalization or illnesses.  He denies any diarrhea,, shortness of breath or difficulty breathing.   Medications: Reviewed without changes. Current Outpatient Medications  Medication Sig Dispense Refill  . Multiple Vitamin (MULTI-VITAMIN) tablet Take 1 tablet by mouth daily.    . prochlorperazine (COMPAZINE) 10 MG tablet Take 1 tablet (10 mg total) by mouth every 6 (six) hours as needed for nausea or vomiting. 30 tablet 0   No current facility-administered medications for this visit.     Allergies: No Known  Allergies      Physical Exam:   Blood pressure (!) 141/67, pulse 64, temperature 97.8 F (36.6 C), temperature source Tympanic, resp. rate 14, height 5\' 8"  (1.727 m), weight 170 lb 9.6 oz (77.4 kg), SpO2 99 %.    ECOG: 0    General appearance: Alert, awake without any distress. Head: Atraumatic without abnormalities Oropharynx: Without any thrush or ulcers. Eyes: No scleral icterus. Lymph nodes: No lymphadenopathy noted in the cervical, supraclavicular, or axillary nodes Heart:regular rate and rhythm, without any murmurs or gallops.   Lung: Clear to auscultation without any rhonchi, wheezes or dullness to percussion. Abdomin: Soft, nontender without any shifting dullness or ascites. Musculoskeletal: No clubbing or cyanosis. Neurological: No motor or sensory deficits. Skin: No rashes or lesions.          Lab Results: Lab Results  Component Value Date   WBC 8.0 10/20/2020   HGB 14.6 10/20/2020   HCT 44.1 10/20/2020   MCV 84.0 10/20/2020   PLT 249 10/20/2020     Chemistry      Component Value Date/Time   NA 140 10/20/2020 1109   K 4.2 10/20/2020 1109   CL 106 10/20/2020 1109   CO2 26 10/20/2020 1109   BUN 17 10/20/2020 1109   CREATININE 1.11 10/20/2020 1109   CREATININE 1.19 07/27/2020 0836      Component Value Date/Time   CALCIUM 9.3 10/20/2020 1109   ALKPHOS 60 10/20/2020 1109   AST 21 10/20/2020 1109   ALT 18 10/20/2020 1109   BILITOT 0.6 10/20/2020 1109  64 year old with:  1.  Stage IV melanoma with CNS involvement documented in August 2021.  Initially presented with T4N0 cutaneous tumor.   He continues to receive nivolumab without any major complications at this time.  Risks and benefits of continuing this treatment for 12 months were reviewed.  These complications include nausea, fatigue and immune mediated complications.  His last imaging studies in August 2021 did not show any evidence of metastatic disease.  These will be repeated  in February 2022.  2.  CNS metastasis: He status post surgical resection followed by radiation therapy without any evidence of relapsed disease.  Next MRI is scheduled for 11/24/2020.  3.  Dermatology surveillance: He continues to follow with dermatology which I recommended continuing for the time being.  He had skin lesions but nonmelanoma at this time.  4.  Immune mediated complications.  He has not experienced any complications at this time.  He has been include pneumonitis, colitis, hepatitis and thyroid disease will continue to be monitored.  5.  Antiemetics: No nausea or vomiting reported at this time.  Compazine is available to him.  6.  IV access: He continues to use peripheral veins without any issues at this time.  7  Follow-up: He will return in 1 month for a follow-up visit.   30  minutes were spent time on this visit.  The time was dedicated to reviewing his disease status, discussing complications related to his cancer and cancer therapy.      Walter Button, MD 12/9/202111:05 AM

## 2020-11-17 NOTE — Patient Instructions (Signed)
[facility] Discharge Instructions for Patients Receiving Chemotherapy  Today you received the following chemotherapy agents Opdivo  To help prevent nausea and vomiting after your treatment, we encourage you to take your nausea medication as directed   If you develop nausea and vomiting that is not controlled by your nausea medication, call the clinic.   BELOW ARE SYMPTOMS THAT SHOULD BE REPORTED IMMEDIATELY:  *FEVER GREATER THAN 100.5 F  *CHILLS WITH OR WITHOUT FEVER  NAUSEA AND VOMITING THAT IS NOT CONTROLLED WITH YOUR NAUSEA MEDICATION  *UNUSUAL SHORTNESS OF BREATH  *UNUSUAL BRUISING OR BLEEDING  TENDERNESS IN MOUTH AND THROAT WITH OR WITHOUT PRESENCE OF ULCERS  *URINARY PROBLEMS  *BOWEL PROBLEMS  UNUSUAL RASH Items with * indicate a potential emergency and should be followed up as soon as possible.  Feel free to call the clinic should you have any questions or concerns. The clinic phone number is [phone].  Please show the CHEMO ALERT CARD at check-in to the Emergency Department and triage nurse.   

## 2020-11-24 ENCOUNTER — Ambulatory Visit
Admission: RE | Admit: 2020-11-24 | Discharge: 2020-11-24 | Disposition: A | Discharge status: Home / Self Care | Payer: Medicare Other | Source: Ambulatory Visit | Attending: Radiation Oncology | Admitting: Radiation Oncology

## 2020-11-24 ENCOUNTER — Other Ambulatory Visit: Payer: Medicare Other

## 2020-11-24 ENCOUNTER — Other Ambulatory Visit: Payer: Self-pay

## 2020-11-24 DIAGNOSIS — C7931 Secondary malignant neoplasm of brain: Secondary | ICD-10-CM

## 2020-11-24 IMAGING — MR MR HEAD WO/W CM
12 series · 48 of 48 positions shown · IV contrast (16 ML MULTIHANCE)
Comparison: Brain MRI [DATE] and earlier.

CLINICAL DATA: 64-year-old male with metastatic melanoma status
post surgical resection of a lobulated 4.9 cm right hemisphere mass
in [REDACTED]. Status post postop SRS in [REDACTED]. Restaging.

EXAM:
MRI HEAD WITHOUT AND WITH CONTRAST
TECHNIQUE: Multiplanar, multiecho pulse sequences of the brain and surrounding
structures were obtained without and with intravenous contrast.
CONTRAST:  15mL MULTIHANCE GADOBENATE DIMEGLUMINE 529 MG/ML IV SOLN

[Series 2: FLAIR · sagittal · 3.0mm · 0.75mm/px · 3 of 39 slices shown (1 of 2)]
[im 1/39]
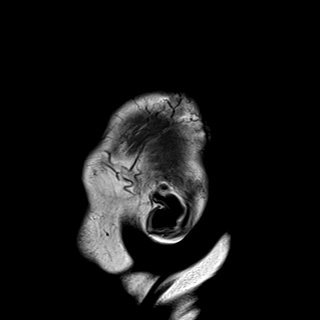
[im 20/39]
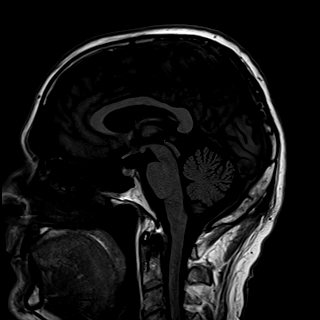
[im 39/39]
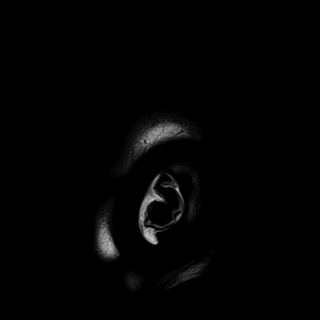

[Series 3: DWI · axial · 3.0mm · 1.50mm/px · z∈[-68,+80]mm · 4 of 78 slices shown (1 of 2)]
[im 1/78]
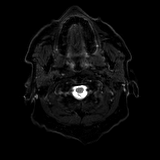
[im 26/78]
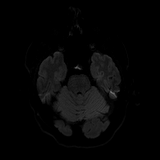
[im 52/78]
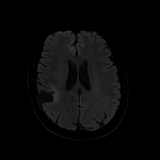
[im 78/78]
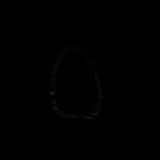

[Series 4: DWI · axial · 3.0mm · 1.50mm/px · z∈[-68,+80]mm · 2 of 35 slices shown (2 of 2)]
[im 1/35]
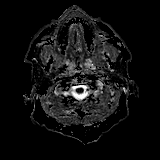
[im 35/35]
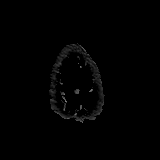

[Series 5: T2 · axial · 5.0mm · 0.57mm/px · 1 of 27 slices shown (1 of 2)]
[im 1/27]
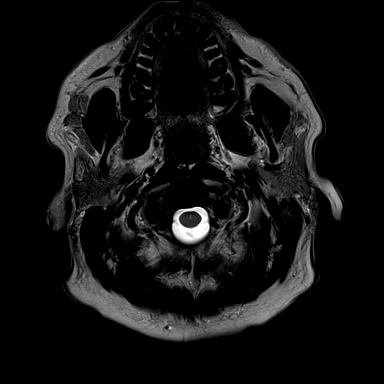

[Series 7: swi_images · axial · 1.5mm · 0.90mm/px · z∈[-65,+77]mm · 5 of 96 slices shown]
[im 1/96]
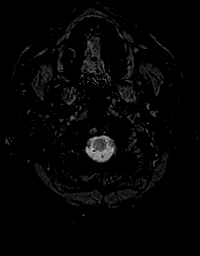
[im 24/96]
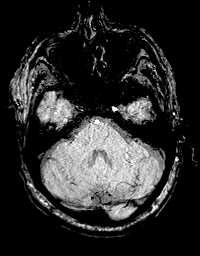
[im 48/96]
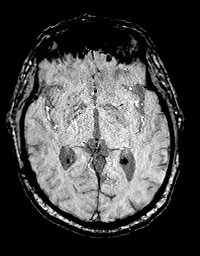
[im 72/96]
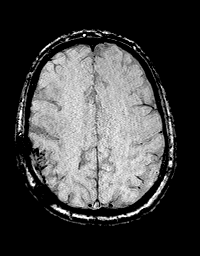
[im 96/96]
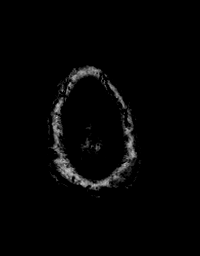

[Series 8: FLAIR · axial · 3.0mm · 0.86mm/px · z∈[-77,+88]mm · 3 of 56 slices shown (2 of 2)]
[im 1/56]
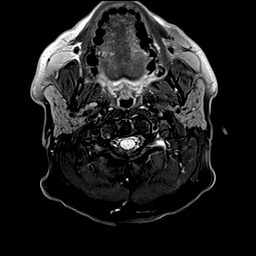
[im 28/56]
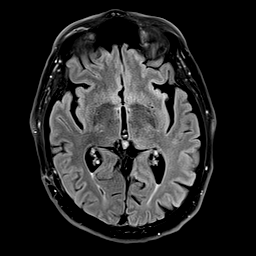
[im 56/56]
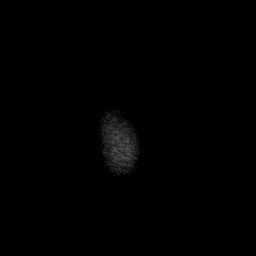

[Series 9: T2 · axial · non-contrast · 1.0mm · 0.86mm/px · z∈[-72,+84]mm · 8 of 160 slices shown (2 of 2)]
[im 1/160]
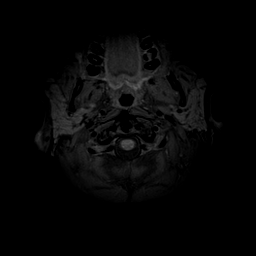
[im 23/160]
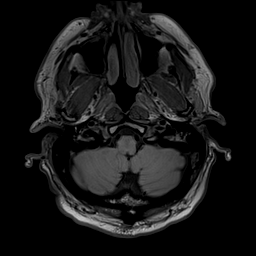
[im 46/160]
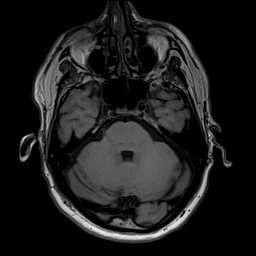
[im 69/160]
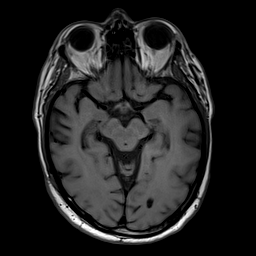
[im 91/160]
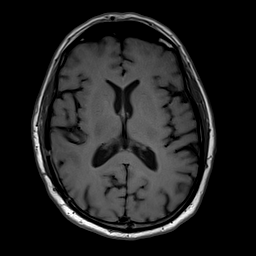
[im 114/160]
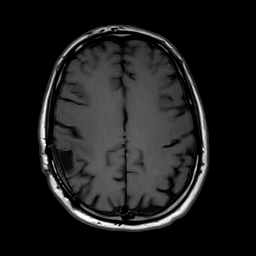
[im 137/160]
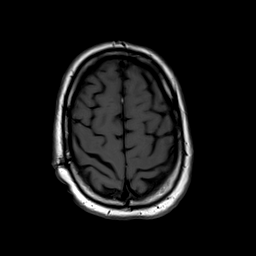
[im 160/160]
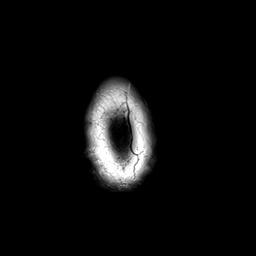

[Series 10: T2 post-contrast · coronal · 3.0mm · 0.57mm/px · 2 of 40 slices shown (1 of 2)]
[im 1/40]
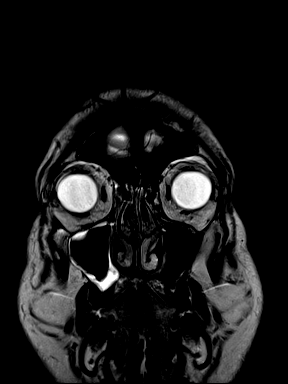
[im 40/40]
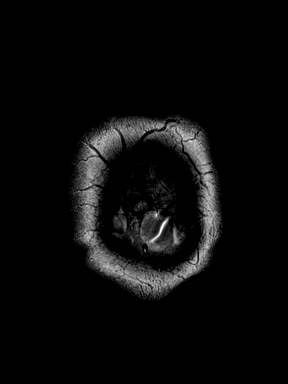

[Series 11: T2 post-contrast · axial · 1.0mm · 0.86mm/px · z∈[-72,+84]mm · 8 of 160 slices shown (2 of 2)]
[im 1/160]
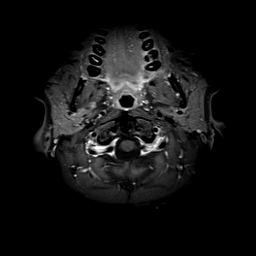
[im 23/160]
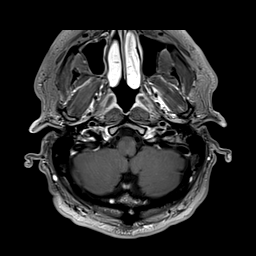
[im 46/160]
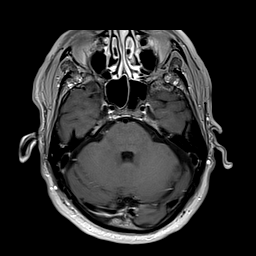
[im 69/160]
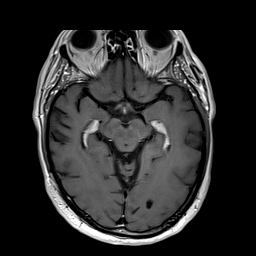
[im 91/160]
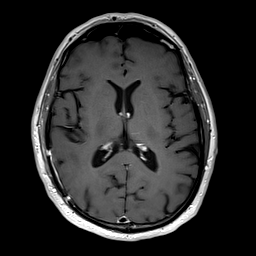
[im 114/160]
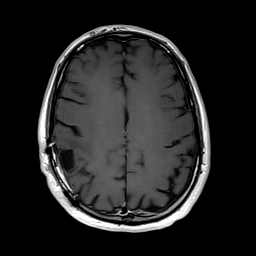
[im 137/160]
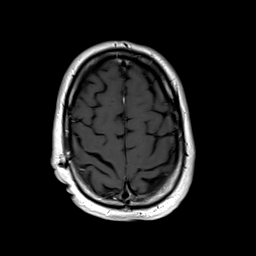
[im 160/160]
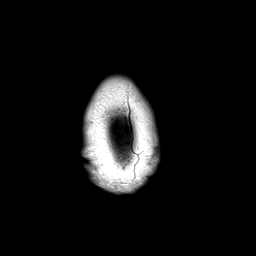

[Series 12: T1 post-contrast · axial · 1.0mm · 0.75mm/px · z∈[-74,+85]mm · 8 of 160 slices shown (1 of 2)]
[im 1/160]
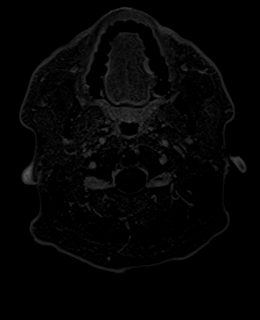
[im 23/160]
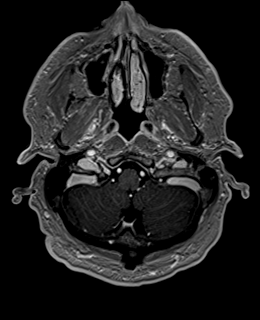
[im 46/160]
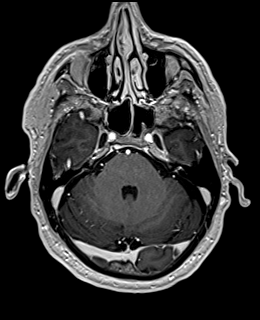
[im 69/160]
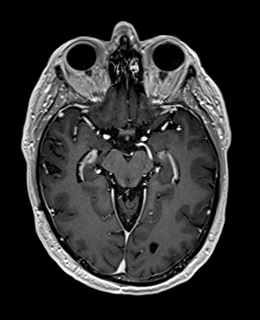
[im 91/160]
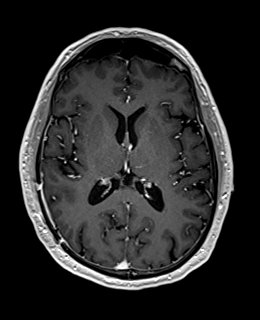
[im 114/160]
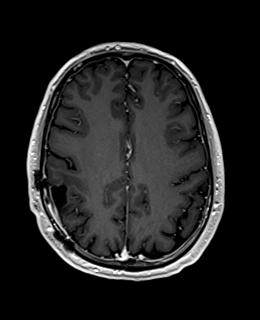
[im 137/160]
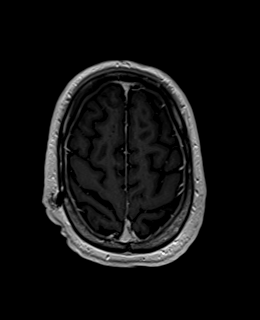
[im 160/160]
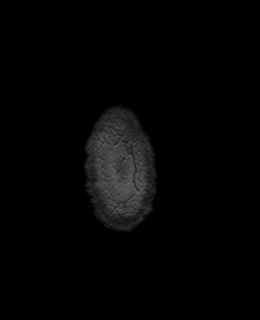

[Series 13: T1 post-contrast · coronal · 3.0mm · 0.57mm/px · 2 of 41 slices shown (2 of 2)]
[im 1/41]
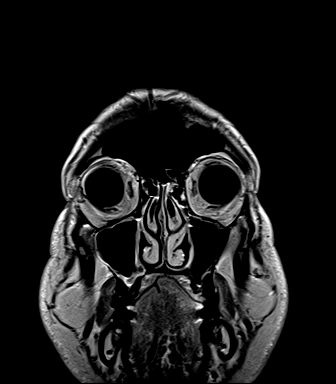
[im 41/41]
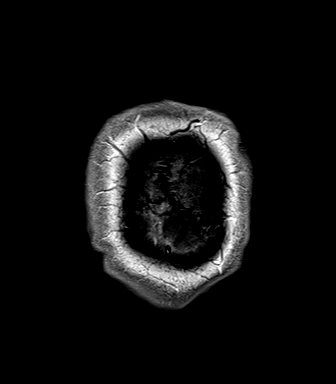

[Series 14: FLAIR post-contrast · sagittal · 3.0mm · 0.75mm/px · 2 of 39 slices shown]
[im 1/39]
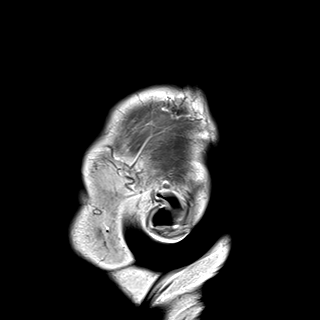
[im 39/39]
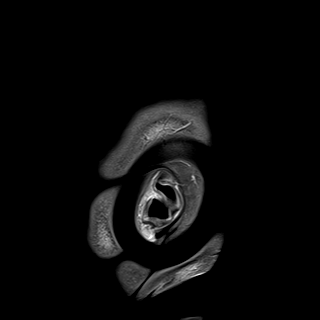

[48 of 48 positions shown; findings below may reference images not displayed]

FINDINGS: Brain: Cystic right posterior frontal or parietal resection cavity
has decreased in size since [REDACTED]. Mild hemosiderin. Regional T2
and FLAIR hyperintense signal has regressed with mild residual.
Regressed peripheral enhancement along the resection cavity, with
abnormal enhancement now at that site.

Decreased volume of overlying small postoperative subdural fluid
collection, with associated smooth dural thickening and enhancement
which has mildly increased (series 12, image 105).

No new intracranial mass or other abnormal enhancement identified.

No superimposed restricted diffusion to suggest acute infarction. No
midline shift, ventriculomegaly, or acute intracranial hemorrhage.
Cervicomedullary junction and pituitary are within normal limits.
Outside of the treatment site gray and white matter signal appears
normal for age. No other chronic cerebral blood products.

Vascular: Major intracranial vascular flow voids are stable. The
major dural venous sinuses are enhancing and appear to be patent.

Skull and upper cervical spine: Visible cervical spine and spinal
cord are within normal limits. Decreased conspicuity of small
enhancing bone lesion along the posterior left convexity (series 12,
image 126), also still visible on DWI. Post craniotomy changes on
the contralateral right side with no adverse features. No new bone
lesion identified.

Sinuses/Orbits: Stable and negative orbits. Mild new right maxillary
sinus mucosal thickening.

Other: Mastoids are clear. Visible internal auditory structures
appear normal. Stable scalp soft tissues.
IMPRESSION: 1. Satisfactory post treatment appearance of the brain.
Decreasing size of right hemisphere resection cavity with no
abnormal enhancement and decreased regional FLAIR hyperintensity.
Overlying postoperative dural thickening suspected.

2. Decreased conspicuity of suspected small left parietal bone
metastasis.

3. No new metastatic disease or acute intracranial abnormality
identified.

## 2020-11-24 MED ORDER — GADOBENATE DIMEGLUMINE 529 MG/ML IV SOLN
15.0000 mL | Freq: Once | INTRAVENOUS | Status: AC | PRN
  Administered 2020-11-24: 15 mL via INTRAVENOUS

## 2020-11-29 ENCOUNTER — Other Ambulatory Visit: Payer: Self-pay

## 2020-11-29 ENCOUNTER — Encounter: Payer: Self-pay | Admitting: Internal Medicine

## 2020-11-29 ENCOUNTER — Inpatient Hospital Stay: Payer: Medicare Other | Admitting: Internal Medicine

## 2020-11-29 DIAGNOSIS — C7931 Secondary malignant neoplasm of brain: Secondary | ICD-10-CM

## 2020-11-29 DIAGNOSIS — C439 Malignant melanoma of skin, unspecified: Secondary | ICD-10-CM | POA: Diagnosis not present

## 2020-11-29 NOTE — Progress Notes (Signed)
[facility] [place], [place] [address] [phone]   New Patient Evaluation  Date of Service: 11/29/20 Patient Name: Walter Rodriguez Patient MRN: [id number] Patient DOB: 17-Sep-1956 Provider: Ventura Sellers, MD  Identifying Statement:  Walter Rodriguez is a 64 y.o. male with No primary diagnosis found. who presents for initial consultation and evaluation regarding cancer associated neurologic deficits.    Referring Provider: Kyung Rudd, MD [address] Home,  [place] [address]  Primary Cancer:  Oncologic History: Oncology History  Melanoma of skin ([place])  09/07/2020 Initial Diagnosis   Melanoma of skin ([place])   09/22/2020 -  Chemotherapy   The patient had nivolumab (OPDIVO) 480 mg in sodium chloride 0.9 % 100 mL chemo infusion, 480 mg, Intravenous, Once, 3 of 6 cycles Administration: 480 mg (09/22/2020), 480 mg (11/17/2020), 480 mg (10/20/2020)  for chemotherapy treatment.    Metastasis to brain [facility])  08/05/2020 Surgery   Craniotomy, resection of 4.7cm R parietal mass with Dr. Marcello Moores; path c/w melanoma    - 08/31/2020 Radiation Therapy   Completes 27Gy/37fx to resection cavity with Dr. Lisbeth Renshaw     History of Present Illness: The patient's records from the referring physician were obtained and reviewed and the patient interviewed to confirm this HPI.  Walter Rodriguez presented to clinical attention in August 2021 with several weeks history of left arm and leg weakness.  He experienced a fall at home and was unable to get up, which prompted evaluation and CNS imaging.  MRI demonstrated large enhancing mass within right parietal lobe consistent with metastatic disease.  He underwent craniotomy and gross resection with Dr. Marcello Moores on 08/05/20, which was followed by post-op SRS in 3 fractions with Dr. Lisbeth Renshaw, completed on 08/31/20.  He has completed decadron taper.  At present time he has no motor complaints and no subjective neurologic  deficits.  Is undergoing rx with Nivolumab with Dr. Alen Blew.  Very active at home, walks and cycles every day.  Medications: Current Outpatient Medications on File Prior to Visit  Medication Sig Dispense Refill  . Multiple Vitamin (MULTI-VITAMIN) tablet Take 1 tablet by mouth daily.    . prochlorperazine (COMPAZINE) 10 MG tablet Take 1 tablet (10 mg total) by mouth every 6 (six) hours as needed for nausea or vomiting. 30 tablet 0   No current facility-administered medications on file prior to visit.    Allergies: No Known Allergies Past Medical History:  Past Medical History:  Diagnosis Date  . Melanoma ([place])    back  . Open wound    back   Past Surgical History:  Past Surgical History:  Procedure Laterality Date  . APPLICATION OF CRANIAL NAVIGATION N/A 08/05/2020   Procedure: APPLICATION OF CRANIAL NAVIGATION;  Surgeon: Vallarie Mare, MD;  Location: [place];  Service: Neurosurgery;  Laterality: N/A;  . CRANIOTOMY N/A 08/05/2020   Procedure: CRANIOTOMY TUMOR EXCISION;  Surgeon: Vallarie Mare, MD;  Location: [place] [facility];  Service: Neurosurgery;  Laterality: N/A;  . MELANOMA EXCISION WITH SENTINEL LYMPH NODE BIOPSY N/A 04/01/2019   Procedure: WIDE LOCAL EXCISION OF BACK MELANOMA WITH RIGHT AXILLARY SENTINEL LYMPH NODE BIOPSY;  Surgeon: Stark Klein, MD;  Location: [facility];  Service: General;  Laterality: N/A;  . SKIN SPLIT GRAFT N/A 04/10/2019   Procedure: SPLIT THICKNESS SKIN GRAFT FROM RIGHT  THIGH TO THE BACK;  Surgeon: Irene Limbo, MD;  Location: [facility];  Service: Plastics;  Laterality: N/A;  Social History:  Social History   Socioeconomic History  . Marital status: Single    Spouse name: Not on file  . Number of children: Not on file  . Years of education: Not on file  . Highest education level: Not on file  Occupational History  . Not on file  Tobacco Use  . Smoking status: Never Smoker  . Smokeless tobacco: Never Used  Vaping Use  . Vaping Use: Never used   Substance and Sexual Activity  . Alcohol use: Never  . Drug use: Never  . Sexual activity: Not on file  Other Topics Concern  . Not on file  Social History Narrative  . Not on file   Social Determinants of Health   Financial Resource Strain: Not on file  Food Insecurity: Not on file  Transportation Needs: Not on file  Physical Activity: Not on file  Stress: Not on file  Social Connections: Not on file  Intimate Partner Violence: Not on file   Family History: History reviewed. No pertinent family history.  Review of Systems: Constitutional: Doesn't report fevers, chills or abnormal weight loss Eyes: Doesn't report blurriness of vision Ears, nose, mouth, throat, and face: Doesn't report sore throat Respiratory: Doesn't report cough, dyspnea or wheezes Cardiovascular: Doesn't report palpitation, chest discomfort  Gastrointestinal:  Doesn't report nausea, constipation, diarrhea GU: Doesn't report incontinence Skin: Doesn't report skin rashes Neurological: Per HPI Musculoskeletal: Doesn't report joint pain Behavioral/Psych: Doesn't report anxiety  Physical Exam: Vitals:   11/29/20 0939  BP: (!) 141/78  Pulse: 65  Resp: 20  Temp: 98.2 F (36.8 C)  SpO2: 100%   KPS: 90. General: Alert, cooperative, pleasant, in no acute distress Head: Normal EENT: No conjunctival injection or scleral icterus.  Lungs: Resp effort normal Cardiac: Regular rate Abdomen: Non-distended abdomen Skin: No rashes cyanosis or petechiae. Extremities: No clubbing or edema  Neurologic Exam: Mental Status: Awake, alert, attentive to examiner. Oriented to self and environment. Language is fluent with intact comprehension.  Cranial Nerves: Visual acuity is grossly normal. Visual fields are full. Extra-ocular movements intact. No ptosis. Face is symmetric Motor: Tone and bulk are normal. Power is full in both arms and legs. Reflexes are symmetric, no pathologic reflexes present.  Sensory: Intact to  light touch Gait: Normal.   Labs: I have reviewed the data as listed    Component Value Date/Time   NA 141 11/17/2020 1058   K 4.1 11/17/2020 1058   CL 107 11/17/2020 1058   CO2 23 11/17/2020 1058   GLUCOSE 97 11/17/2020 1058   BUN 20 11/17/2020 1058   CREATININE 1.10 11/17/2020 1058   CREATININE 1.19 07/27/2020 0836   CALCIUM 9.2 11/17/2020 1058   PROT 7.1 11/17/2020 1058   ALBUMIN 3.9 11/17/2020 1058   AST 20 11/17/2020 1058   ALT 18 11/17/2020 1058   ALKPHOS 59 11/17/2020 1058   BILITOT 0.6 11/17/2020 1058   GFRNONAA >60 11/17/2020 1058   GFRNONAA 63 07/27/2020 0836   GFRAA >60 08/07/2020 0429   GFRAA 73 07/27/2020 0836   Lab Results  Component Value Date   WBC 7.1 11/17/2020   NEUTROABS 5.0 11/17/2020   HGB 14.4 11/17/2020   HCT 42.9 11/17/2020   MCV 82.2 11/17/2020   PLT 240 11/17/2020    Imaging:  MR Brain W Wo Contrast  Result Date: 11/24/2020 CLINICAL DATA:  64 year old male with metastatic melanoma status post surgical resection of a lobulated 4.9 cm right hemisphere mass in August. Status post postop SRS in  September. Restaging. EXAM: MRI HEAD WITHOUT AND WITH CONTRAST TECHNIQUE: Multiplanar, multiecho pulse sequences of the brain and surrounding structures were obtained without and with intravenous contrast. CONTRAST:  66mL MULTIHANCE GADOBENATE DIMEGLUMINE 529 MG/ML IV SOLN COMPARISON:  Brain MRI 08/22/2020 and earlier. FINDINGS: Brain: Cystic right posterior frontal or parietal resection cavity has decreased in size since September. Mild hemosiderin. Regional T2 and FLAIR hyperintense signal has regressed with mild residual. Regressed peripheral enhancement along the resection cavity, with abnormal enhancement now at that site. Decreased volume of overlying small postoperative subdural fluid collection, with associated smooth dural thickening and enhancement which has mildly increased (series 12, image 105). No new intracranial mass or other abnormal  enhancement identified. No superimposed restricted diffusion to suggest acute infarction. No midline shift, ventriculomegaly, or acute intracranial hemorrhage. Cervicomedullary junction and pituitary are within normal limits. Outside of the treatment site gray and white matter signal appears normal for age. No other chronic cerebral blood products. Vascular: Major intracranial vascular flow voids are stable. The major dural venous sinuses are enhancing and appear to be patent. Skull and upper cervical spine: Visible cervical spine and spinal cord are within normal limits. Decreased conspicuity of small enhancing bone lesion along the posterior left convexity (series 12, image 126), also still visible on DWI. Post craniotomy changes on the contralateral right side with no adverse features. No new bone lesion identified. Sinuses/Orbits: Stable and negative orbits. Mild new right maxillary sinus mucosal thickening. Other: Mastoids are clear. Visible internal auditory structures appear normal. Stable scalp soft tissues. IMPRESSION: 1. Satisfactory post treatment appearance of the brain. Decreasing size of right hemisphere resection cavity with no abnormal enhancement and decreased regional FLAIR hyperintensity. Overlying postoperative dural thickening suspected. 2. Decreased conspicuity of suspected small left parietal bone metastasis. 3. No new metastatic disease or acute intracranial abnormality identified. Electronically Signed   By: Genevie Ann M.D.   On: 11/24/2020 09:47    [facility] Clinician Interpretation: I have personally reviewed the radiological images as listed.  My interpretation, in the context of the patient's clinical presentation, is stable disease   Assessment/Plan Brain Metastasis  CLEAVEN DEMARIO is clinically stable today, now having completed surgery, radiation therapy for large melanoma CNS metastasis.  MRI demonstrates good local control without enhancing disease surrounding treatment site, or  elsewhere in the brain.  He will continue to undergo rx with immunotherapy through Dr. Alen Blew, well tolerated.  We encouraged him to remain active and continue aerobic exercise regimen.  We spent twenty additional minutes teaching regarding the natural history, biology, and historical experience in the treatment of neurologic complications of cancer.   We appreciate the opportunity to participate in the care of AMANDEEP NESMITH.   We ask that Walter Rodriguez return to clinic in 3 months following next brain MRI, or sooner as needed.  All questions were answered. The patient knows to call the clinic with any problems, questions or concerns. No barriers to learning were detected.  The total time spent in the encounter was 40 minutes and more than 50% was on counseling and review of test results   Ventura Sellers, MD Medical Director of Neuro-Oncology [facility] at [place] 11/29/20 9:49 AM

## 2020-11-30 ENCOUNTER — Telehealth: Payer: Self-pay | Admitting: Internal Medicine

## 2020-11-30 NOTE — Telephone Encounter (Signed)
Scheduled follow-up appointment. Patient is aware. 

## 2020-12-01 ENCOUNTER — Other Ambulatory Visit: Payer: Self-pay | Admitting: Radiation Therapy

## 2020-12-15 ENCOUNTER — Inpatient Hospital Stay: Payer: Medicare Other

## 2020-12-15 ENCOUNTER — Inpatient Hospital Stay: Payer: Medicare Other | Attending: Oncology

## 2020-12-15 ENCOUNTER — Inpatient Hospital Stay (HOSPITAL_BASED_OUTPATIENT_CLINIC_OR_DEPARTMENT_OTHER): Payer: Medicare Other | Admitting: Oncology

## 2020-12-15 ENCOUNTER — Other Ambulatory Visit: Payer: Self-pay

## 2020-12-15 VITALS — BP 129/77 | HR 60 | Temp 97.7°F | Resp 18 | Ht 68.0 in | Wt 171.1 lb

## 2020-12-15 DIAGNOSIS — R21 Rash and other nonspecific skin eruption: Secondary | ICD-10-CM | POA: Insufficient documentation

## 2020-12-15 DIAGNOSIS — C439 Malignant melanoma of skin, unspecified: Secondary | ICD-10-CM | POA: Diagnosis not present

## 2020-12-15 DIAGNOSIS — Z923 Personal history of irradiation: Secondary | ICD-10-CM | POA: Insufficient documentation

## 2020-12-15 DIAGNOSIS — Z5112 Encounter for antineoplastic immunotherapy: Secondary | ICD-10-CM | POA: Insufficient documentation

## 2020-12-15 DIAGNOSIS — Z79899 Other long term (current) drug therapy: Secondary | ICD-10-CM | POA: Diagnosis not present

## 2020-12-15 DIAGNOSIS — C7931 Secondary malignant neoplasm of brain: Secondary | ICD-10-CM

## 2020-12-15 LAB — CBC WITH DIFFERENTIAL (CANCER CENTER ONLY)
Abs Immature Granulocytes: 0.02 10*3/uL (ref 0.00–0.07)
Basophils Absolute: 0.1 10*3/uL (ref 0.0–0.1)
Basophils Relative: 1 %
Eosinophils Absolute: 0.2 10*3/uL (ref 0.0–0.5)
Eosinophils Relative: 3 %
HCT: 46.2 % (ref 39.0–52.0)
Hemoglobin: 15 g/dL (ref 13.0–17.0)
Immature Granulocytes: 0 %
Lymphocytes Relative: 23 %
Lymphs Abs: 1.7 10*3/uL (ref 0.7–4.0)
MCH: 27.1 pg (ref 26.0–34.0)
MCHC: 32.5 g/dL (ref 30.0–36.0)
MCV: 83.4 fL (ref 80.0–100.0)
Monocytes Absolute: 0.5 10*3/uL (ref 0.1–1.0)
Monocytes Relative: 7 %
Neutro Abs: 4.8 10*3/uL (ref 1.7–7.7)
Neutrophils Relative %: 66 %
Platelet Count: 219 10*3/uL (ref 150–400)
RBC: 5.54 MIL/uL (ref 4.22–5.81)
RDW: 13.1 % (ref 11.5–15.5)
WBC Count: 7.3 10*3/uL (ref 4.0–10.5)
nRBC: 0 % (ref 0.0–0.2)

## 2020-12-15 LAB — CMP (CANCER CENTER ONLY)
ALT: 20 U/L (ref 0–44)
AST: 20 U/L (ref 15–41)
Albumin: 4 g/dL (ref 3.5–5.0)
Alkaline Phosphatase: 59 U/L (ref 38–126)
Anion gap: 9 (ref 5–15)
BUN: 18 mg/dL (ref 8–23)
CO2: 25 mmol/L (ref 22–32)
Calcium: 9.5 mg/dL (ref 8.9–10.3)
Chloride: 106 mmol/L (ref 98–111)
Creatinine: 1.18 mg/dL (ref 0.61–1.24)
GFR, Estimated: >60 mL/min (ref >60)
Glucose, Bld: 85 mg/dL (ref 70–99)
Potassium: 4.2 mmol/L (ref 3.5–5.1)
Sodium: 140 mmol/L (ref 135–145)
Total Bilirubin: 0.6 mg/dL (ref 0.3–1.2)
Total Protein: 7 g/dL (ref 6.5–8.1)

## 2020-12-15 LAB — TSH: TSH: 2.068 u[IU]/mL (ref 0.320–4.118)

## 2020-12-15 MED ORDER — SODIUM CHLORIDE 0.9 % IV SOLN
480.0000 mg | Freq: Once | INTRAVENOUS | Status: AC
  Administered 2020-12-15: 480 mg via INTRAVENOUS
  Filled 2020-12-15: qty 48

## 2020-12-15 MED ORDER — SODIUM CHLORIDE 0.9 % IV SOLN
Freq: Once | INTRAVENOUS | Status: AC
  Filled 2020-12-15: qty 250

## 2020-12-15 NOTE — Patient Instructions (Signed)
[facility] Discharge Instructions for Patients Receiving Chemotherapy  Today you received the following chemotherapy agents Opdivo  To help prevent nausea and vomiting after your treatment, we encourage you to take your nausea medication as directed   If you develop nausea and vomiting that is not controlled by your nausea medication, call the clinic.   BELOW ARE SYMPTOMS THAT SHOULD BE REPORTED IMMEDIATELY:  *FEVER GREATER THAN 100.5 F  *CHILLS WITH OR WITHOUT FEVER  NAUSEA AND VOMITING THAT IS NOT CONTROLLED WITH YOUR NAUSEA MEDICATION  *UNUSUAL SHORTNESS OF BREATH  *UNUSUAL BRUISING OR BLEEDING  TENDERNESS IN MOUTH AND THROAT WITH OR WITHOUT PRESENCE OF ULCERS  *URINARY PROBLEMS  *BOWEL PROBLEMS  UNUSUAL RASH Items with * indicate a potential emergency and should be followed up as soon as possible.  Feel free to call the clinic should you have any questions or concerns. The clinic phone number is [phone].  Please show the CHEMO ALERT CARD at check-in to the Emergency Department and triage nurse.   

## 2020-12-15 NOTE — Progress Notes (Signed)
Hematology and Oncology Follow Up Visit  Walter Rodriguez [id number] 03-30-1956 65 y.o. 12/15/2020 8:20 AM Walter Rodriguez, MDWhite, Aram Beecham, MD   Principle Diagnosis: 65 year old man with T4N0 melanoma of the upper back diagnosed in August 2020.  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   Prior Therapy:  He is status post local wide excision as well as right axillary sentinel lymph node sampling completed by Dr. Donell Beers on April 01, 2019.  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 Leta Baptist on Apr 10, 2019.  He is status post right craniotomy and excision of brain tumor completed on August 05, 2020 by Dr. Maisie Fus.  He status post radiation therapy following his craniotomy completed on August 25, 2020.  He received 27 Gy in 3 fractions utilizing stereotactic radiosurgery.   Current therapy: Nivolumab 480 mg every 4 weeks started on September 22, 2020.  He is here for cycle 4 of therapy.  Interim History: [name] presents today for repeat evaluation.  Since the last visit, he reports no major changes in his health.  He has tolerated nivolumab without any complaints.  He denies any nausea, vomiting or abdominal pain.  He denies any worsening fatigue or tiredness.  He does report some occasional pruritus and rash but easily manageable at this time.  He denies any headaches or neurological deficits.  He denies any seizure activity.   Medications: Reviewed without changes. Current Outpatient Medications  Medication Sig Dispense Refill  . Multiple Vitamin (MULTI-VITAMIN) tablet Take 1 tablet by mouth daily.    . prochlorperazine (COMPAZINE) 10 MG tablet Take 1 tablet (10 mg total) by mouth every 6 (six) hours as needed for nausea or vomiting. 30 tablet 0   No current facility-administered medications for this visit.     Allergies: No Known Allergies      Physical Exam:    Blood pressure 129/77, pulse 60, temperature 97.7 F (36.5 C), temperature source Tympanic, resp. rate 18, height 5\' 8"  (1.727 m), weight 171 lb 1.6 oz (77.6 kg), SpO2 99 %.    ECOG: 0   General appearance: Comfortable appearing without any discomfort Head: Normocephalic without any trauma Oropharynx: Mucous membranes are moist and pink without any thrush or ulcers. Eyes: Pupils are equal and round reactive to light. Lymph nodes: No cervical, supraclavicular, inguinal or axillary lymphadenopathy.   Heart:regular rate and rhythm.  S1 and S2 without leg edema. Lung: Clear without any rhonchi or wheezes.  No dullness to percussion. Abdomin: Soft, nontender, nondistended with good bowel sounds.  No hepatosplenomegaly. Musculoskeletal: No joint deformity or effusion.  Full range of motion noted. Neurological: No deficits noted on motor, sensory and deep tendon reflex exam. Skin: No petechial rash or dryness.  Appeared moist.            Lab Results: Lab Results  Component Value Date   WBC 7.3 12/15/2020   HGB 15.0 12/15/2020   HCT 46.2 12/15/2020   MCV 83.4 12/15/2020   PLT 219 12/15/2020     Chemistry      Component Value Date/Time   NA 141 11/17/2020 1058   K 4.1 11/17/2020 1058   CL 107 11/17/2020 1058   CO2 23 11/17/2020 1058   BUN 20 11/17/2020 1058   CREATININE 1.10 11/17/2020 1058   CREATININE 1.19 07/27/2020 0836      Component Value Date/Time   CALCIUM 9.2 11/17/2020 1058  ALKPHOS 59 11/17/2020 1058   AST 20 11/17/2020 1058   ALT 18 11/17/2020 1058   BILITOT 0.6 11/17/2020 4943        65 year old with:  1.  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  He subsequently developed stage IV disease with CNS involvement.  The natural course of his disease was reviewed at this time.  Treatment options were also reiterated if he develops relapsed disease.  He is currently on nivolumab and he has tolerated well and the plan is to  complete 12 cycles of therapy.  He has not had a repeat imaging studies in August 2021 and the plan is to update his staging in February of this year.   2.  CNS metastasis: MRI obtained on 11/24/2020 which showed satisfactory response to treatment without any recurrent disease.  We will continue to have surveillance MRI images in the future.  3.  Dermatology surveillance: I recommended continued follow-up with dermatology regarding this issue.  4.  Immune mediated complications.  I continue to educate him about potential complication related pneumonitis, colitis, thyroid disease among others.  He is experiencing very little complications.  5.  Antiemetics: Compazine is available to him without any nausea or vomiting.  6.  IV access: Peripheral veins are currently in use without any issues.   7  Follow-up: In 4 weeks for the next cycle of therapy.   30  minutes were dedicated to this encounter.  The time was spent on updating his disease status, addressing complications related to his cancer and cancer therapy and future plan of care discussion.      Zola Button, MD 1/6/20228:20 AM

## 2021-01-12 ENCOUNTER — Other Ambulatory Visit: Payer: Self-pay

## 2021-01-12 ENCOUNTER — Inpatient Hospital Stay: Payer: Medicare Other

## 2021-01-12 ENCOUNTER — Inpatient Hospital Stay: Payer: Medicare Other | Admitting: Oncology

## 2021-01-12 ENCOUNTER — Inpatient Hospital Stay: Payer: Medicare Other | Attending: Oncology

## 2021-01-12 VITALS — BP 138/76 | HR 65 | Temp 97.9°F | Resp 18 | Ht 68.0 in | Wt 171.8 lb

## 2021-01-12 DIAGNOSIS — Z79899 Other long term (current) drug therapy: Secondary | ICD-10-CM | POA: Diagnosis not present

## 2021-01-12 DIAGNOSIS — C439 Malignant melanoma of skin, unspecified: Secondary | ICD-10-CM

## 2021-01-12 DIAGNOSIS — Z5112 Encounter for antineoplastic immunotherapy: Secondary | ICD-10-CM | POA: Insufficient documentation

## 2021-01-12 DIAGNOSIS — C4359 Malignant melanoma of other part of trunk: Secondary | ICD-10-CM | POA: Insufficient documentation

## 2021-01-12 DIAGNOSIS — C7932 Secondary malignant neoplasm of cerebral meninges: Secondary | ICD-10-CM | POA: Diagnosis not present

## 2021-01-12 LAB — CMP (CANCER CENTER ONLY)
ALT: 30 U/L (ref 0–44)
AST: 25 U/L (ref 15–41)
Albumin: 4 g/dL (ref 3.5–5.0)
Alkaline Phosphatase: 60 U/L (ref 38–126)
Anion gap: 7 (ref 5–15)
BUN: 17 mg/dL (ref 8–23)
CO2: 27 mmol/L (ref 22–32)
Calcium: 9.3 mg/dL (ref 8.9–10.3)
Chloride: 105 mmol/L (ref 98–111)
Creatinine: 1.16 mg/dL (ref 0.61–1.24)
GFR, Estimated: >60 mL/min (ref >60)
Glucose, Bld: 89 mg/dL (ref 70–99)
Potassium: 4.3 mmol/L (ref 3.5–5.1)
Sodium: 139 mmol/L (ref 135–145)
Total Bilirubin: 0.5 mg/dL (ref 0.3–1.2)
Total Protein: 7 g/dL (ref 6.5–8.1)

## 2021-01-12 LAB — CBC WITH DIFFERENTIAL (CANCER CENTER ONLY)
Abs Immature Granulocytes: 0.02 10*3/uL (ref 0.00–0.07)
Basophils Absolute: 0.1 10*3/uL (ref 0.0–0.1)
Basophils Relative: 1 %
Eosinophils Absolute: 0.1 10*3/uL (ref 0.0–0.5)
Eosinophils Relative: 2 %
HCT: 45 % (ref 39.0–52.0)
Hemoglobin: 14.5 g/dL (ref 13.0–17.0)
Immature Granulocytes: 0 %
Lymphocytes Relative: 26 %
Lymphs Abs: 1.7 10*3/uL (ref 0.7–4.0)
MCH: 26.9 pg (ref 26.0–34.0)
MCHC: 32.2 g/dL (ref 30.0–36.0)
MCV: 83.5 fL (ref 80.0–100.0)
Monocytes Absolute: 0.6 10*3/uL (ref 0.1–1.0)
Monocytes Relative: 9 %
Neutro Abs: 4 10*3/uL (ref 1.7–7.7)
Neutrophils Relative %: 62 %
Platelet Count: 232 10*3/uL (ref 150–400)
RBC: 5.39 MIL/uL (ref 4.22–5.81)
RDW: 13.4 % (ref 11.5–15.5)
WBC Count: 6.4 10*3/uL (ref 4.0–10.5)
nRBC: 0 % (ref 0.0–0.2)

## 2021-01-12 LAB — TSH: TSH: 2.736 u[IU]/mL (ref 0.320–4.118)

## 2021-01-12 MED ORDER — NIVOLUMAB CHEMO INJECTION 100 MG/10ML
480.0000 mg | Freq: Once | INTRAVENOUS | Status: AC
  Administered 2021-01-12: 480 mg via INTRAVENOUS
  Filled 2021-01-12: qty 48

## 2021-01-12 MED ORDER — SODIUM CHLORIDE 0.9 % IV SOLN
Freq: Once | INTRAVENOUS | Status: AC
  Filled 2021-01-12: qty 250

## 2021-01-12 NOTE — Progress Notes (Signed)
Hematology and Oncology Follow Up Visit  DYLLON HENKEN [id number] 1956/08/12 65 y.o. 01/12/2021 8:11 AM [name], MDWhite, [name], MD   Principle Diagnosis: 65 year old man with cutaneous melanoma of the upper back presented with T4N0 in August 2020.  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   Prior Therapy:  He is status post local wide excision as well as right axillary sentinel lymph node sampling completed by Dr. Barry Dienes on April 01, 2019.  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 Iran Planas on Apr 10, 2019.  He is status post right craniotomy and excision of brain tumor completed on August 05, 2020 by Dr. Marcello Moores.  He status post radiation therapy following his craniotomy completed on August 25, 2020.  He received 27 Gy in 3 fractions utilizing stereotactic radiosurgery.   Current therapy: Nivolumab 480 mg every 4 weeks started on September 22, 2020.  He is here for cycle 5 of therapy.  Interim History: Mr. Gassett returns today for a follow-up visit.  Since the last visit, he has tolerated nivolumab without any major complaints.  He denies any nausea, vomiting or abdominal pain.  He denies any neurological symptoms including headaches, blurred vision or seizure activities.  He is eating well and performs all activities of daily living.  He denies respiratory complaints or diarrhea.   Medications: Updated on review. Current Outpatient Medications  Medication Sig Dispense Refill  . Multiple Vitamin (MULTI-VITAMIN) tablet Take 1 tablet by mouth daily.    . prochlorperazine (COMPAZINE) 10 MG tablet Take 1 tablet (10 mg total) by mouth every 6 (six) hours as needed for nausea or vomiting. 30 tablet 0   No current facility-administered medications for this visit.     Allergies: No Known Allergies      Physical Exam:   Blood pressure 138/76, pulse 65, temperature 97.9 F (36.6 C),  temperature source Tympanic, resp. rate 18, height 5' 8"  (1.727 m), weight 171 lb 12.8 oz (77.9 kg), SpO2 100 %.    ECOG: 0   General appearance: Alert, awake without any distress. Head: Atraumatic without abnormalities Oropharynx: Without any thrush or ulcers. Eyes: No scleral icterus. Lymph nodes: No lymphadenopathy noted in the cervical, supraclavicular, or axillary nodes Heart:regular rate and rhythm, without any murmurs or gallops.   Lung: Clear to auscultation without any rhonchi, wheezes or dullness to percussion. Abdomin: Soft, nontender without any shifting dullness or ascites. Musculoskeletal: No clubbing or cyanosis. Neurological: No motor or sensory deficits. Skin: No rashes or lesions.            Lab Results: Lab Results  Component Value Date   WBC 6.4 01/12/2021   HGB 14.5 01/12/2021   HCT 45.0 01/12/2021   MCV 83.5 01/12/2021   PLT 232 01/12/2021     Chemistry      Component Value Date/Time   NA 140 12/15/2020 0738   K 4.2 12/15/2020 0738   CL 106 12/15/2020 0738   CO2 25 12/15/2020 0738   BUN 18 12/15/2020 0738   CREATININE 1.18 12/15/2020 0738   CREATININE 1.19 07/27/2020 0836      Component Value Date/Time   CALCIUM 9.5 12/15/2020 0738   ALKPHOS 59 12/15/2020 0738   AST 20 12/15/2020 0738   ALT 20 12/15/2020 0738   BILITOT 0.6 12/15/2020 8111        65 year old with:  1.  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   He has no residual disease after surgical resection.  He is currently receiving adjuvant nivolumab after stage IV resected disease.  Risks and benefits of continuing this treatment for 12 months were discussed.  Complications including GI toxicity, dermatological toxicity and immune mediated complications were reviewed.  The plan is to update his staging scans before the next visit he is agreeable to continue with the current treatment.  Salvage therapy in the future  including BRAF targeted therapy (if he harbors the appropriate mutation ) versus combination ipilimumab nivolumab could be used   2.  CNS metastasis: He is scheduled to repeat MRI and follow-up with Dr. [name] in March 2022.  He has no symptoms to suggest relapsed disease.  3.  Dermatology surveillance: He continues to follow with dermatology and I recommended continuing at this time.  4.  Immune mediated complications. Pneumonitis, colitis, hepatitis among others were reviewed and he is not experiencing any at this time.  5.  Antiemetics: No nausea or vomiting reported.  Compazine is available to him.  6.  IV access: No issues with peripheral veins at this time.  No need for Port-A-Cath insertion.   7  Follow-up: He will return in 1 month for the next cycle of therapy.   30  minutes were spent on this visit.  The time was dedicated to reviewing his disease status, discussing treatment options and complications of therapy.      Zola Button, MD 2/3/20228:11 AM

## 2021-01-12 NOTE — Patient Instructions (Signed)
[facility] Discharge Instructions for Patients Receiving Chemotherapy  Today you received the following chemotherapy agents: Nivolumab (Opdivo)  To help prevent nausea and vomiting after your treatment, we encourage you to take your nausea medication as prescribed.    If you develop nausea and vomiting that is not controlled by your nausea medication, call the clinic.   BELOW ARE SYMPTOMS THAT SHOULD BE REPORTED IMMEDIATELY:  *FEVER GREATER THAN 100.5 F  *CHILLS WITH OR WITHOUT FEVER  NAUSEA AND VOMITING THAT IS NOT CONTROLLED WITH YOUR NAUSEA MEDICATION  *UNUSUAL SHORTNESS OF BREATH  *UNUSUAL BRUISING OR BLEEDING  TENDERNESS IN MOUTH AND THROAT WITH OR WITHOUT PRESENCE OF ULCERS  *URINARY PROBLEMS  *BOWEL PROBLEMS  UNUSUAL RASH Items with * indicate a potential emergency and should be followed up as soon as possible.  Feel free to call the clinic should you have any questions or concerns. The clinic phone number is [phone].  Please show the CHEMO ALERT CARD at check-in to the Emergency Department and triage nurse.   

## 2021-01-30 ENCOUNTER — Telehealth: Payer: Self-pay | Admitting: Oncology

## 2021-01-30 NOTE — Telephone Encounter (Signed)
Scheduled per 02/03 los, patient has been called and notified of upcoming March appointments.

## 2021-02-07 ENCOUNTER — Other Ambulatory Visit: Payer: Self-pay

## 2021-02-07 ENCOUNTER — Ambulatory Visit (HOSPITAL_COMMUNITY)
Admission: RE | Admit: 2021-02-07 | Discharge: 2021-02-07 | Disposition: A | Discharge status: Home / Self Care | Payer: Medicare Other | Source: Ambulatory Visit | Attending: Oncology | Admitting: Oncology

## 2021-02-07 DIAGNOSIS — C439 Malignant melanoma of skin, unspecified: Secondary | ICD-10-CM | POA: Diagnosis present

## 2021-02-07 IMAGING — CT CT CHEST-ABD-PELV W/ CM
3 of 5 series · 14 of 36 positions shown, 16 images · IV contrast (APPLIED)
Comparison: Multiple priors including CT chest abdomen pelvis
[DATE]

CLINICAL DATA: Melanoma surveillance examination.

EXAM:
CT CHEST, ABDOMEN, AND PELVIS WITH CONTRAST
TECHNIQUE: Multidetector CT imaging of the chest, abdomen and pelvis was
performed following the standard protocol during bolus
administration of intravenous contrast.
CONTRAST:  100mL OMNIPAQUE IOHEXOL 300 MG/ML  SOLN

[Series 2: cap with · axial · 0.78mm/px · z∈[-314,+216]mm · 9 of 134 slices shown, 11 images]
[im 14/134  mediastinal]
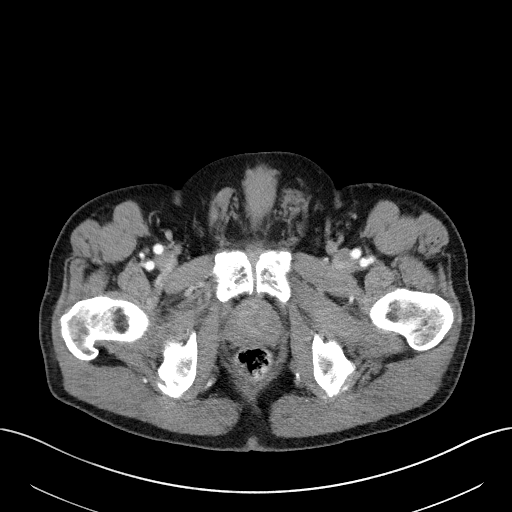
[im 14/134  bone]
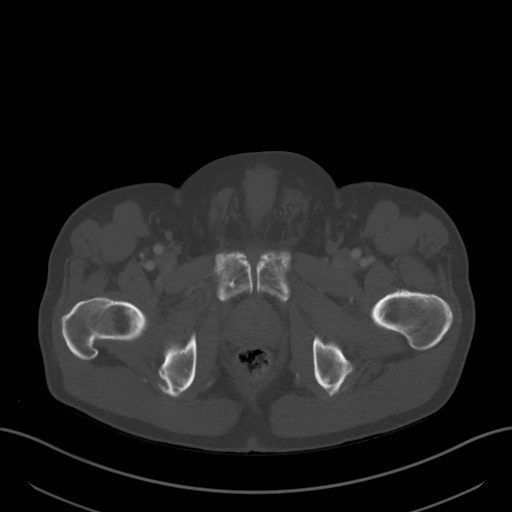
[im 27/134  mediastinal]
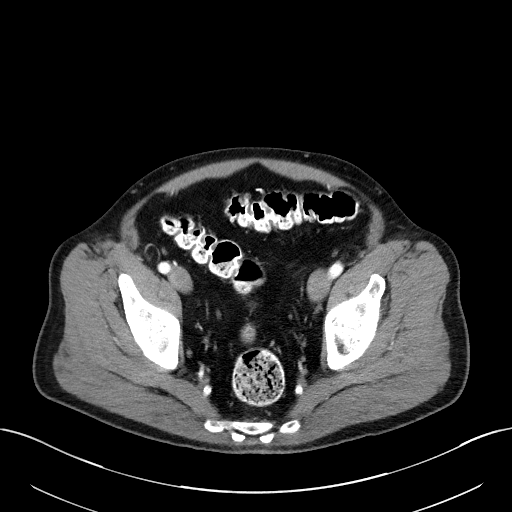
[im 40/134  mediastinal]
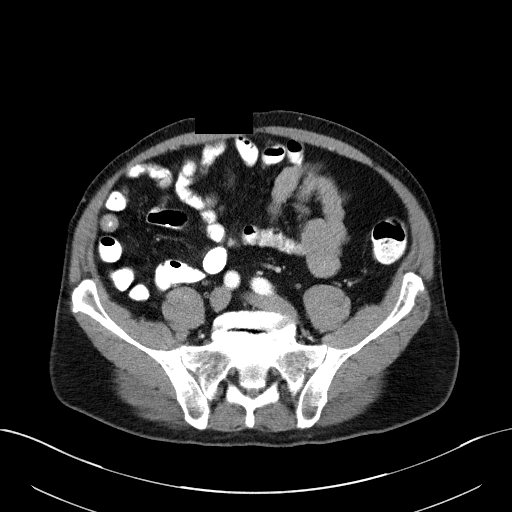
[im 54/134  mediastinal]
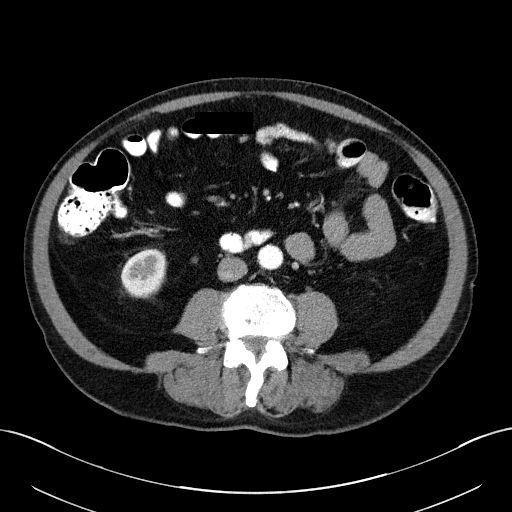
[im 67/134  mediastinal]
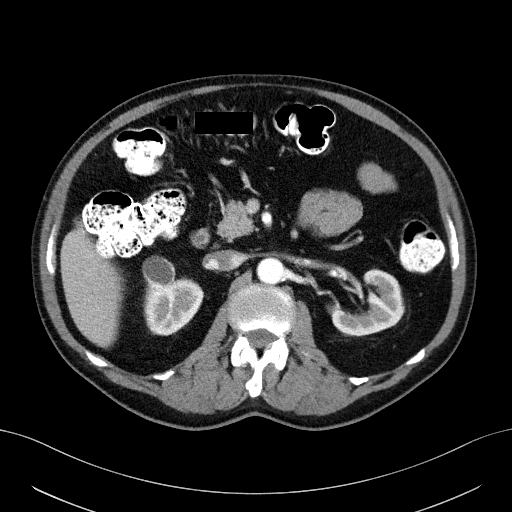
[im 80/134  mediastinal]
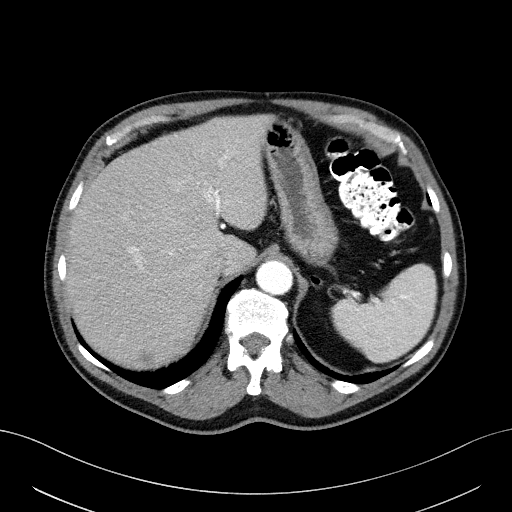
[im 94/134  mediastinal]
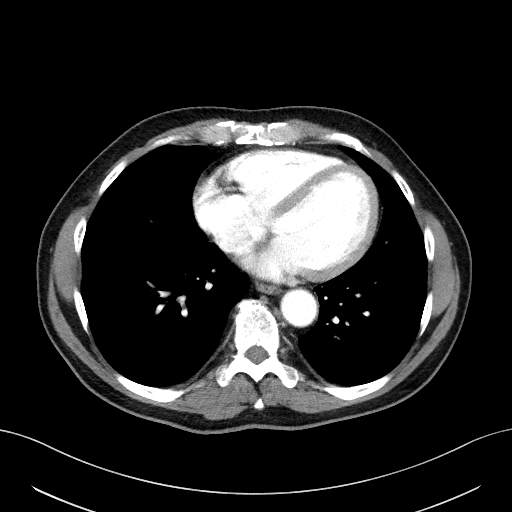
[im 107/134  mediastinal]
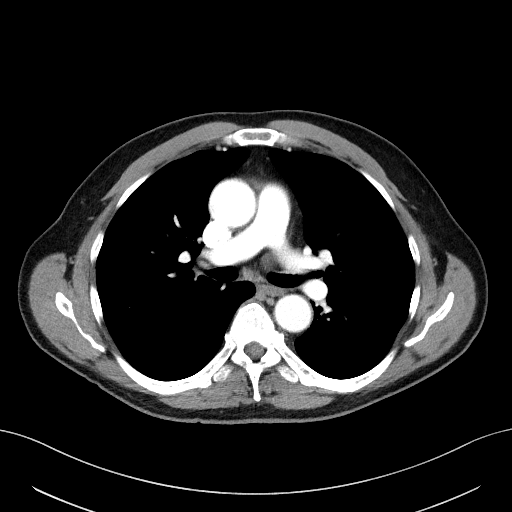
[im 120/134  mediastinal]
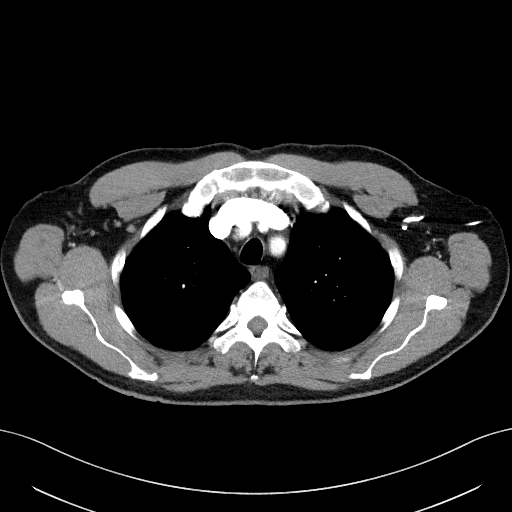
[im 120/134  bone]
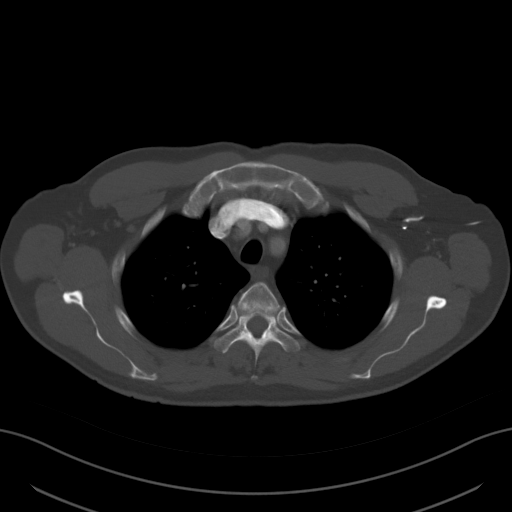

[Series 4: lung · axial · 0.68mm/px · z∈[-14,+36]mm · 2 of 163 slices shown]
[im 13/163  bone]
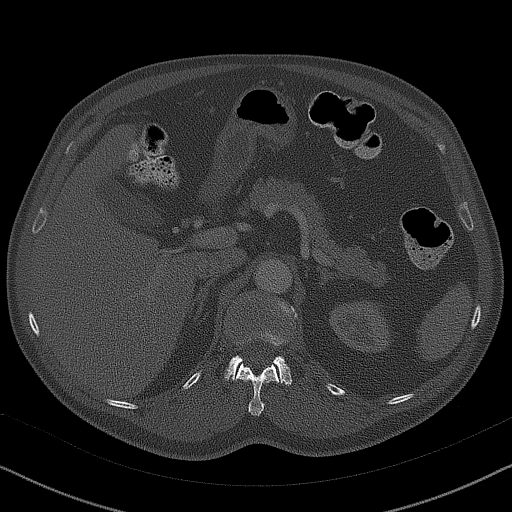
[im 38/163  bone]
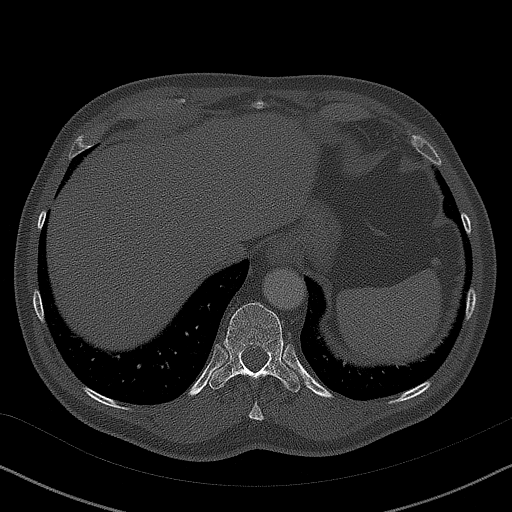

[Series 5: coronals · coronal · 0.82mm/px · 3 of 152 slices shown]
[im 31/152  mediastinal]
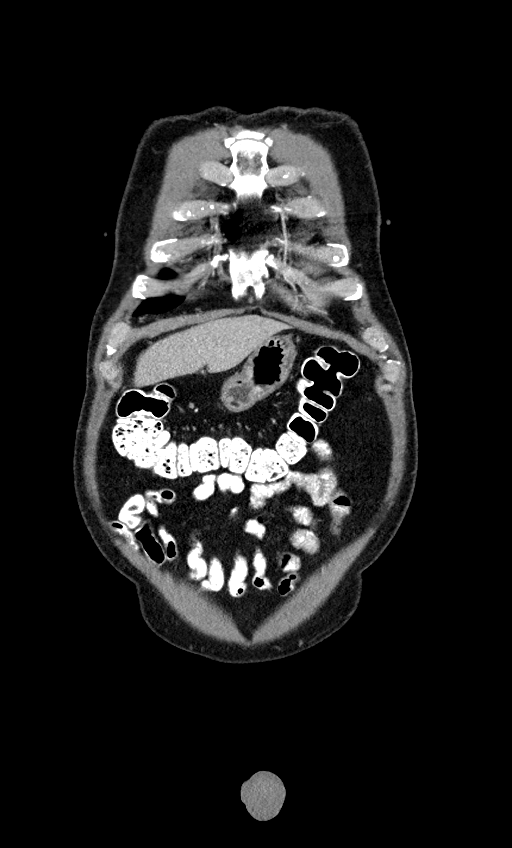
[im 61/152  mediastinal]
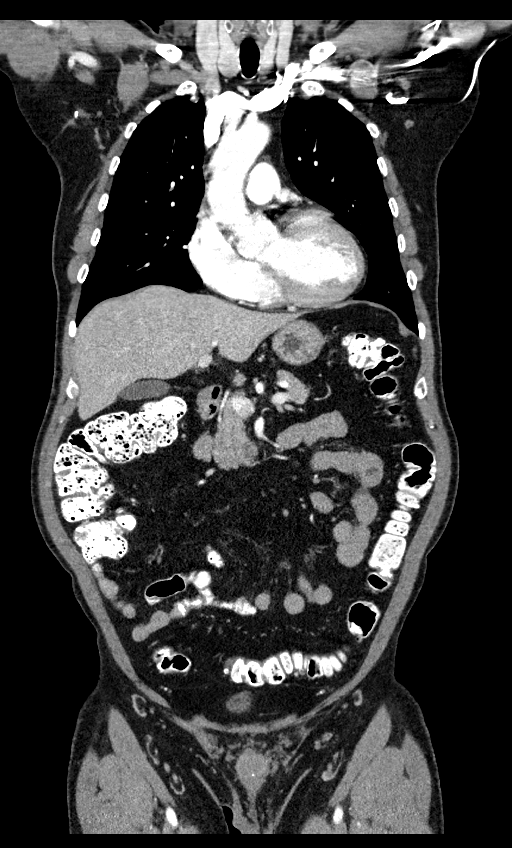
[im 91/152  mediastinal]
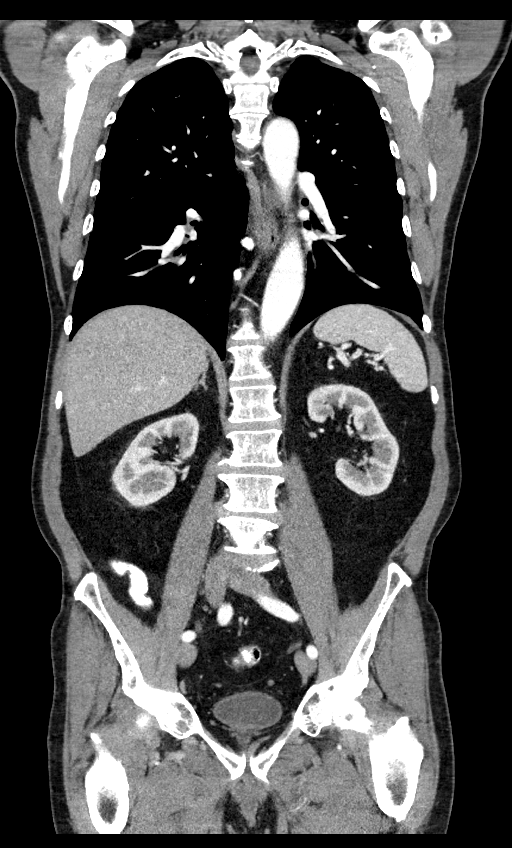

[14 of 36 positions shown; findings below may reference images not displayed]

FINDINGS: CT CHEST FINDINGS

Cardiovascular: Scattered aortic atherosclerosis. No aneurysmal
aortic dilation. No central pulmonary embolus. Normal size heart. No
pericardial effusion.

Mediastinum/Nodes: No mediastinal, hilar or axillary
lymphadenopathy. Prior right axillary lymph node dissection. Thyroid
is unremarkable. Small hiatal hernia.

Lungs/Pleura: No suspicious pulmonary nodules or masses. No pleural
effusion. No pneumothorax.

Musculoskeletal: Subcentimeter sclerotic focus within the right
eighth rib just beyond the costovertebral junction is stable since
prior examination and likely a benign bone island. No suspicious
lytic or blastic bone lesions are seen within the thorax.

CT ABDOMEN PELVIS FINDINGS

Hepatobiliary: There is a new ill-defined hypodense hepatic lesion
which measures approximately 1.4 cm along the posterior aspect of
the right lobe of the liver on image 55/2 in 61/6. Gallbladder is
unremarkable. No biliary ductal dilation.

Pancreas: Unremarkable. No pancreatic ductal dilatation or
surrounding inflammatory changes.

Spleen: Normal in size without focal abnormality.

Adrenals/Urinary Tract: Adrenal glands are unremarkable. Bilateral
renal cysts. Otherwise the kidneys are normal, without renal
calculi, solid lesion, or hydronephrosis. Bladder is unremarkable.

Stomach/Bowel: Stomach is unremarkable. Small duodenal diverticulum.
Normal location of the duodenum/ligament of Treitz. Appendix and
terminal ileum appear unremarkable. No suspicious colonic wall
thickening or mass like lesions.

Vascular/Lymphatic: Aortic atherosclerosis. No enlarged abdominal or
pelvic lymph nodes.

Reproductive: Prostate is unremarkable.

Other: No abdominopelvic ascites.

Musculoskeletal: Multilevel degenerative changes spine. No acute or
significant osseous findings.
IMPRESSION: 1. New ill-defined hypodense hepatic lesion along the posterior
aspect of the right lobe of the liver, which is nonspecific but
concerning for metastatic disease. Further evaluation with liver
protocol MRI with and without IV contrast is recommended.
2. No evidence of metastatic disease within the chest or pelvis and
no lymphadenopathy.
3. Aortic atherosclerosis.  Aortic Atherosclerosis ([3J]-[3J]).

These results will be called to the ordering clinician or
representative by the Radiologist Assistant, and communication
documented in the PACS or [REDACTED].

## 2021-02-07 MED ORDER — IOHEXOL 300 MG/ML  SOLN
100.0000 mL | Freq: Once | INTRAMUSCULAR | Status: AC | PRN
  Administered 2021-02-07: 100 mL via INTRAVENOUS

## 2021-02-08 ENCOUNTER — Telehealth: Payer: Self-pay

## 2021-02-08 NOTE — Telephone Encounter (Signed)
Received a call from [facility] to report results of CT scan. Dr. Alen Blew made aware of report and that report is available to review in Epic.

## 2021-02-09 ENCOUNTER — Inpatient Hospital Stay: Payer: Medicare Other | Admitting: Oncology

## 2021-02-09 ENCOUNTER — Inpatient Hospital Stay: Payer: Medicare Other

## 2021-02-09 ENCOUNTER — Other Ambulatory Visit: Payer: Self-pay

## 2021-02-09 ENCOUNTER — Inpatient Hospital Stay: Payer: Medicare Other | Attending: Oncology

## 2021-02-09 VITALS — BP 134/78 | HR 60 | Temp 97.8°F | Resp 18 | Ht 69.0 in | Wt 171.8 lb

## 2021-02-09 DIAGNOSIS — C7931 Secondary malignant neoplasm of brain: Secondary | ICD-10-CM | POA: Diagnosis not present

## 2021-02-09 DIAGNOSIS — Z79899 Other long term (current) drug therapy: Secondary | ICD-10-CM | POA: Diagnosis not present

## 2021-02-09 DIAGNOSIS — C439 Malignant melanoma of skin, unspecified: Secondary | ICD-10-CM | POA: Diagnosis not present

## 2021-02-09 DIAGNOSIS — I700 Atherosclerosis of aorta: Secondary | ICD-10-CM | POA: Insufficient documentation

## 2021-02-09 DIAGNOSIS — Z5112 Encounter for antineoplastic immunotherapy: Secondary | ICD-10-CM | POA: Insufficient documentation

## 2021-02-09 DIAGNOSIS — R160 Hepatomegaly, not elsewhere classified: Secondary | ICD-10-CM | POA: Diagnosis not present

## 2021-02-09 LAB — CBC WITH DIFFERENTIAL (CANCER CENTER ONLY)
Abs Immature Granulocytes: 0.02 10*3/uL (ref 0.00–0.07)
Basophils Absolute: 0.1 10*3/uL (ref 0.0–0.1)
Basophils Relative: 1 %
Eosinophils Absolute: 0.2 10*3/uL (ref 0.0–0.5)
Eosinophils Relative: 3 %
HCT: 45.7 % (ref 39.0–52.0)
Hemoglobin: 14.6 g/dL (ref 13.0–17.0)
Immature Granulocytes: 0 %
Lymphocytes Relative: 21 %
Lymphs Abs: 1.5 10*3/uL (ref 0.7–4.0)
MCH: 26.6 pg (ref 26.0–34.0)
MCHC: 31.9 g/dL (ref 30.0–36.0)
MCV: 83.4 fL (ref 80.0–100.0)
Monocytes Absolute: 0.5 10*3/uL (ref 0.1–1.0)
Monocytes Relative: 7 %
Neutro Abs: 5 10*3/uL (ref 1.7–7.7)
Neutrophils Relative %: 68 %
Platelet Count: 214 10*3/uL (ref 150–400)
RBC: 5.48 MIL/uL (ref 4.22–5.81)
RDW: 13.5 % (ref 11.5–15.5)
WBC Count: 7.3 10*3/uL (ref 4.0–10.5)
nRBC: 0 % (ref 0.0–0.2)

## 2021-02-09 LAB — CMP (CANCER CENTER ONLY)
ALT: 24 U/L (ref 0–44)
AST: 22 U/L (ref 15–41)
Albumin: 4.1 g/dL (ref 3.5–5.0)
Alkaline Phosphatase: 61 U/L (ref 38–126)
Anion gap: 9 (ref 5–15)
BUN: 20 mg/dL (ref 8–23)
CO2: 26 mmol/L (ref 22–32)
Calcium: 9 mg/dL (ref 8.9–10.3)
Chloride: 104 mmol/L (ref 98–111)
Creatinine: 1.28 mg/dL — ABNORMAL HIGH (ref 0.61–1.24)
GFR, Estimated: >60 mL/min (ref >60)
Glucose, Bld: 91 mg/dL (ref 70–99)
Potassium: 4.2 mmol/L (ref 3.5–5.1)
Sodium: 139 mmol/L (ref 135–145)
Total Bilirubin: 0.7 mg/dL (ref 0.3–1.2)
Total Protein: 7.1 g/dL (ref 6.5–8.1)

## 2021-02-09 LAB — TSH: TSH: 3.347 u[IU]/mL (ref 0.320–4.118)

## 2021-02-09 MED ORDER — SODIUM CHLORIDE 0.9 % IV SOLN
480.0000 mg | Freq: Once | INTRAVENOUS | Status: AC
  Administered 2021-02-09: 480 mg via INTRAVENOUS
  Filled 2021-02-09: qty 48

## 2021-02-09 MED ORDER — SODIUM CHLORIDE 0.9 % IV SOLN
Freq: Once | INTRAVENOUS | Status: AC
Start: 2021-02-09 — End: 2021-02-09
  Filled 2021-02-09: qty 250

## 2021-02-09 NOTE — Progress Notes (Signed)
Hematology and Oncology Follow Up Visit  Walter Rodriguez [id number] Jun 27, 1956 65 y.o. 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  He initially presented with T4N0 in August 2020.   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  Prior Therapy:  He is status post local wide excision as well as right axillary sentinel lymph node sampling completed by Dr. Barry Dienes on April 01, 2019.  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 Iran Planas on Apr 10, 2019.  He is status post right craniotomy and excision of brain tumor completed on August 05, 2020 by Dr. Marcello Moores.  He status post radiation therapy following his craniotomy completed on August 25, 2020.  He received 27 Gy in 3 fractions utilizing stereotactic radiosurgery.   Current therapy: Nivolumab 480 mg every 4 weeks started on September 22, 2020.  He is here for cycle 6 of therapy.  Interim History: [name] presents today for repeat evaluation.  Since the last visit, he reports no major changes in his health.  He continues to feel well without any recent complaints.  He denies excessive fatigue tiredness or weakness.  He denies any diarrhea or respiratory complaints.  His performance status quality of life remained excellent.   Medications: Unchanged on review. Current Outpatient Medications  Medication Sig Dispense Refill  . Multiple Vitamin (MULTI-VITAMIN) tablet Take 1 tablet by mouth daily.    . prochlorperazine (COMPAZINE) 10 MG tablet Take 1 tablet (10 mg total) by mouth every 6 (six) hours as needed for nausea or vomiting. 30 tablet 0   No current facility-administered medications for this visit.     Allergies: No Known Allergies      Physical Exam:   Blood pressure 134/78, pulse 60, temperature 97.8 F  (36.6 C), resp. rate 18, height 5' 9"  (1.753 m), weight 171 lb 12.8 oz (77.9 kg), SpO2 100 %.     ECOG: 0     General appearance: Comfortable appearing without any discomfort Head: Normocephalic without any trauma Oropharynx: Mucous membranes are moist and pink without any thrush or ulcers. Eyes: Pupils are equal and round reactive to light. Lymph nodes: No cervical, supraclavicular, inguinal or axillary lymphadenopathy.   Heart:regular rate and rhythm.  S1 and S2 without leg edema. Lung: Clear without any rhonchi or wheezes.  No dullness to percussion. Abdomin: Soft, nontender, nondistended with good bowel sounds.  No hepatosplenomegaly. Musculoskeletal: No joint deformity or effusion.  Full range of motion noted. Neurological: No deficits noted on motor, sensory and deep tendon reflex exam. Skin: No petechial rash or dryness.  Appeared moist.             Lab Results: Lab Results  Component Value Date   WBC 6.4 01/12/2021   HGB 14.5 01/12/2021   HCT 45.0 01/12/2021   MCV 83.5 01/12/2021   PLT 232 01/12/2021     Chemistry      Component Value Date/Time   NA 139 01/12/2021 0717   K 4.3 01/12/2021 0717   CL 105 01/12/2021 0717   CO2 27 01/12/2021 0717   BUN 17 01/12/2021 0717   CREATININE 1.16 01/12/2021 0717   CREATININE 1.19 07/27/2020 0836      Component Value Date/Time   CALCIUM 9.3 01/12/2021 0717   ALKPHOS 60 01/12/2021 0717   AST  25 01/12/2021 0717   ALT 30 01/12/2021 0717   BILITOT 0.5 01/12/2021 0717     IMPRESSION: 1. New ill-defined hypodense hepatic lesion along the posterior aspect of the right lobe of the liver, which is nonspecific but concerning for metastatic disease. Further evaluation with liver protocol MRI with and without IV contrast is recommended. 2. No evidence of metastatic disease within the chest or pelvis and no lymphadenopathy. 3. Aortic atherosclerosis.  Aortic Atherosclerosis (ICD10-I70.0).  These results will be  called to the ordering clinician or representative by the Radiologist Assistant, and communication documented in the PACS or [employer].   65 year old with:  1.  Melanoma of the upper back diagnosed in August 2020.  He developed stage IV disease with CNS involvement diagnosed in August 2021.     He is currently on nivolumab adjuvantly without any complications related to therapy.  CT scan obtained on February 07, 2021 was personally reviewed and showed no clear evidence of metastatic disease.  He does have 1 lesion in the liver that need to be evaluated further by MRI.  For the time being, I recommended continuing nivolumab and investigate for possible hepatic metastasis.  If he has clear-cut progression of disease, switching to ipilimumab nivolumab combination versus BRAF targeted therapy.   He is agreeable with this plan at this time.   2.  CNS metastasis: He is scheduled to have a repeat MRI in the near future.  No evidence of disease progression noted clinically.  3.  Dermatology surveillance: I recommended continued follow-up with dermatology.  4.  Immune mediated complications.  I educated him about potential complications including colitis, thyroid disease, pneumonitis, hepatitis among others.  He is not experiencing any at this time.  5.  Antiemetics: He is currently on Compazine without any nausea or vomiting reported.  6.  IV access: Peripheral veins are adequate at this time does not require any central venous access.   7  Follow-up: In 4 weeks for the next cycle of therapy.   30  minutes were dedicated to this encounter.  Time was spent on reviewing imaging studies, disease status and treatment options for the future.      Zola Button, MD 3/3/20227:28 AM

## 2021-02-09 NOTE — Patient Instructions (Signed)
[facility] Discharge Instructions for Patients Receiving Chemotherapy  Today you received the following immunotherapy agent: Nivolumab (Opdivo)  To help prevent nausea and vomiting after your treatment, we encourage you to take your nausea medication as directed by your MD.   If you develop nausea and vomiting that is not controlled by your nausea medication, call the clinic.   BELOW ARE SYMPTOMS THAT SHOULD BE REPORTED IMMEDIATELY:  *FEVER GREATER THAN 100.5 F  *CHILLS WITH OR WITHOUT FEVER  NAUSEA AND VOMITING THAT IS NOT CONTROLLED WITH YOUR NAUSEA MEDICATION  *UNUSUAL SHORTNESS OF BREATH  *UNUSUAL BRUISING OR BLEEDING  TENDERNESS IN MOUTH AND THROAT WITH OR WITHOUT PRESENCE OF ULCERS  *URINARY PROBLEMS  *BOWEL PROBLEMS  UNUSUAL RASH Items with * indicate a potential emergency and should be followed up as soon as possible.  Feel free to call the clinic should you have any questions or concerns. The clinic phone number is [phone].  Please show the CHEMO ALERT CARD at check-in to the Emergency Department and triage nurse.   

## 2021-02-24 ENCOUNTER — Other Ambulatory Visit: Payer: Medicare Other

## 2021-02-24 ENCOUNTER — Other Ambulatory Visit: Payer: Self-pay

## 2021-02-24 ENCOUNTER — Ambulatory Visit
Admission: RE | Admit: 2021-02-24 | Discharge: 2021-02-24 | Disposition: A | Discharge status: Home / Self Care | Payer: Medicare Other | Source: Ambulatory Visit | Attending: Internal Medicine | Admitting: Internal Medicine

## 2021-02-24 DIAGNOSIS — C7931 Secondary malignant neoplasm of brain: Secondary | ICD-10-CM

## 2021-02-24 IMAGING — MR MR HEAD WO/W CM
12 series · 48 of 48 positions shown · IV contrast (multihance)
Comparison: [DATE]

CLINICAL DATA: Follow-up metastatic melanoma. History of surgery
and radiotherapy in [REDACTED] and [DATE].

EXAM:
MRI HEAD WITHOUT AND WITH CONTRAST
TECHNIQUE: Multiplanar, multiecho pulse sequences of the brain and surrounding
structures were obtained without and with intravenous contrast.
CONTRAST:  16mL MULTIHANCE GADOBENATE DIMEGLUMINE 529 MG/ML IV SOLN

[Series 2: FLAIR · sagittal · 3.0mm · 0.75mm/px · 3 of 39 slices shown (1 of 2)]
[im 1/39]
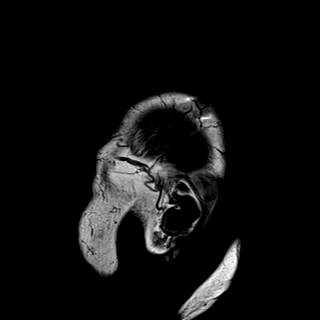
[im 20/39]
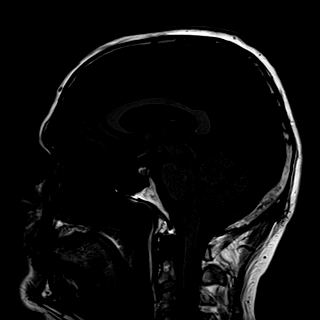
[im 39/39]
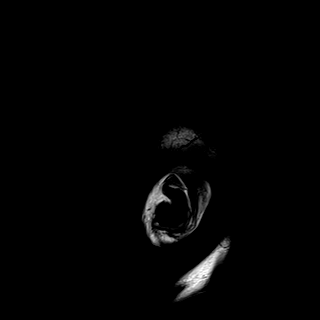

[Series 3: DWI · axial · 3.0mm · 1.56mm/px · z∈[-88,+68]mm · 4 of 82 slices shown (1 of 2)]
[im 1/82]
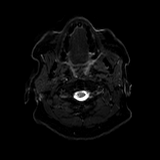
[im 28/82]
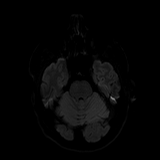
[im 55/82]
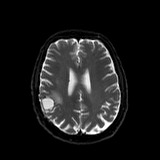
[im 82/82]
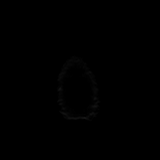

[Series 4: DWI · axial · 3.0mm · 1.56mm/px · z∈[-88,+68]mm · 2 of 39 slices shown (2 of 2)]
[im 1/39]
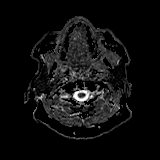
[im 39/39]
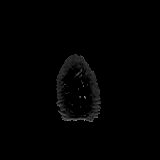

[Series 5: T2 · axial · 5.0mm · 0.75mm/px · 1 of 27 slices shown (1 of 2)]
[im 1/27]
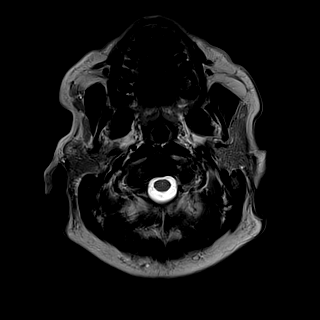

[Series 7: swi_images · axial · 1.5mm · 0.90mm/px · z∈[-83,+71]mm · 5 of 104 slices shown]
[im 1/104]
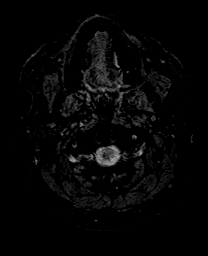
[im 26/104]
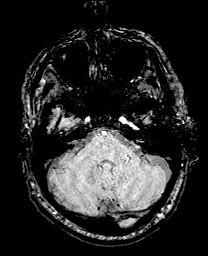
[im 52/104]
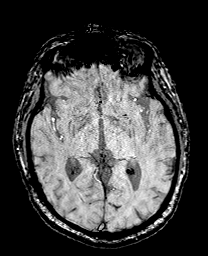
[im 78/104]
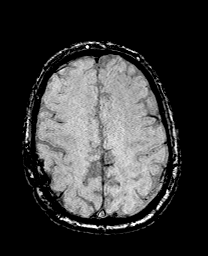
[im 104/104]
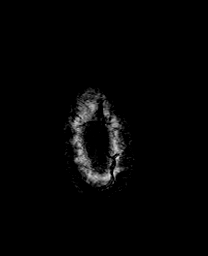

[Series 8: FLAIR · axial · 3.0mm · 0.94mm/px · z∈[-90,+66]mm · 3 of 53 slices shown (2 of 2)]
[im 1/53]
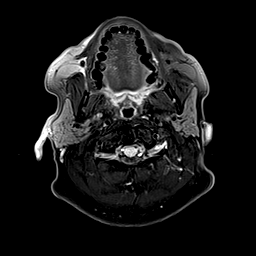
[im 27/53]
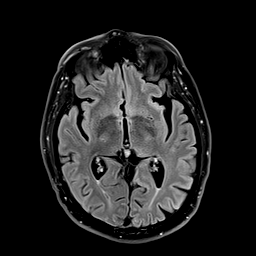
[im 53/53]
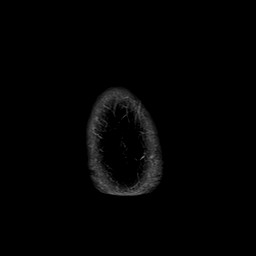

[Series 9: T2 · axial · non-contrast · 1.0mm · 0.86mm/px · z∈[-82,+74]mm · 8 of 160 slices shown (2 of 2)]
[im 1/160]
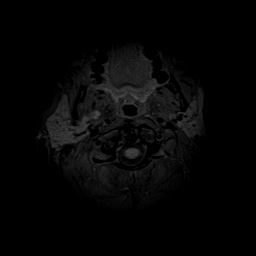
[im 23/160]
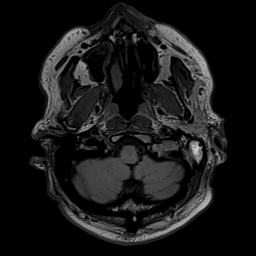
[im 46/160]
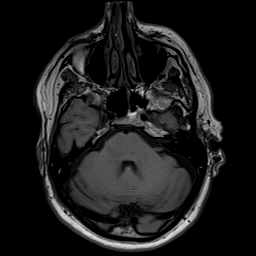
[im 69/160]
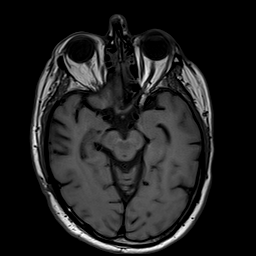
[im 91/160]
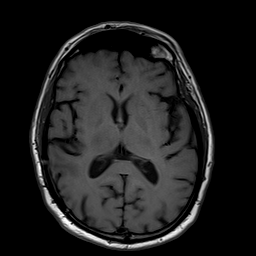
[im 114/160]
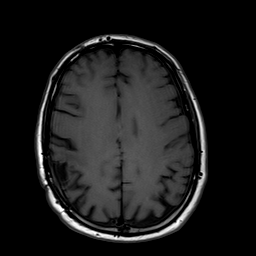
[im 137/160]
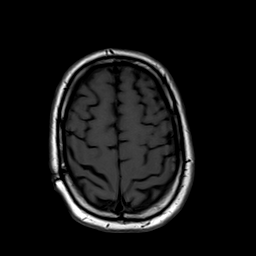
[im 160/160]
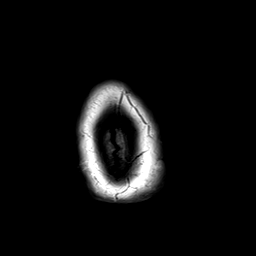

[Series 10: T2 post-contrast · coronal · 3.0mm · 0.69mm/px · 2 of 47 slices shown (1 of 2)]
[im 1/47]
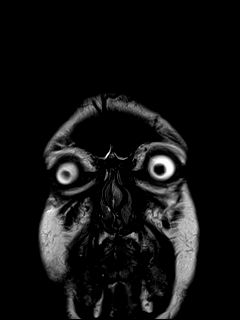
[im 47/47]
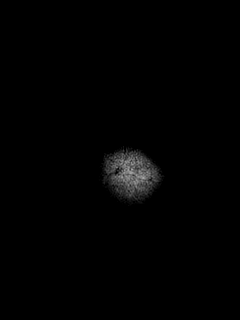

[Series 11: T2 post-contrast · axial · 1.0mm · 0.86mm/px · z∈[-82,+74]mm · 8 of 160 slices shown (2 of 2)]
[im 1/160]
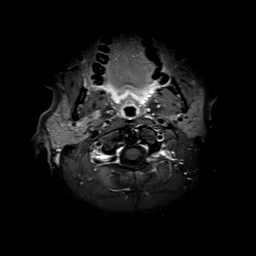
[im 23/160]
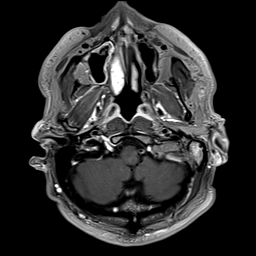
[im 46/160]
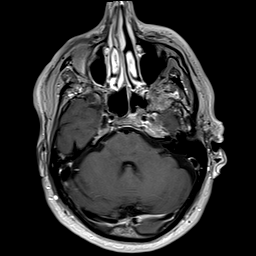
[im 69/160]
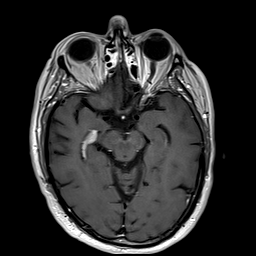
[im 91/160]
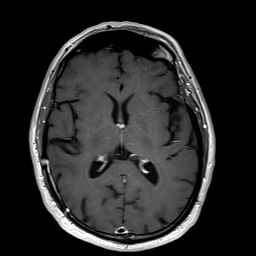
[im 114/160]
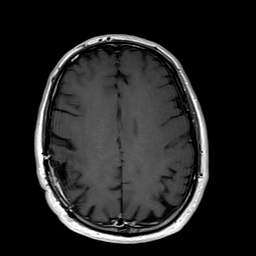
[im 137/160]
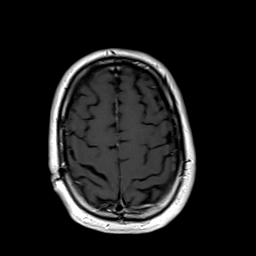
[im 160/160]
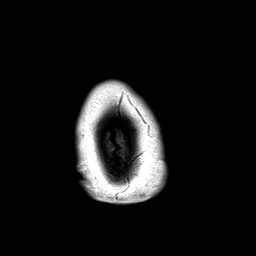

[Series 12: T1 post-contrast · axial · 1.0mm · 0.86mm/px · z∈[-82,+74]mm · 8 of 160 slices shown (1 of 2)]
[im 1/160]
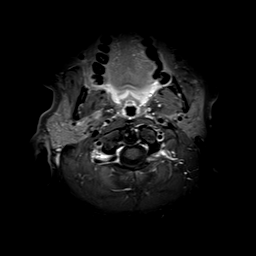
[im 23/160]
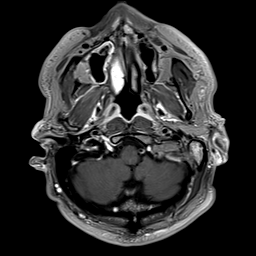
[im 46/160]
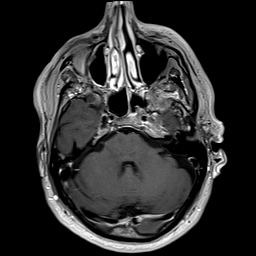
[im 69/160]
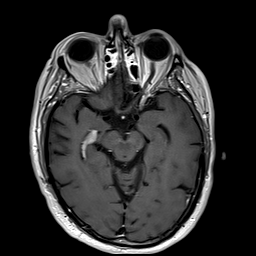
[im 91/160]
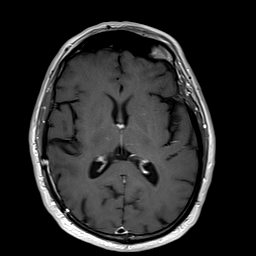
[im 114/160]
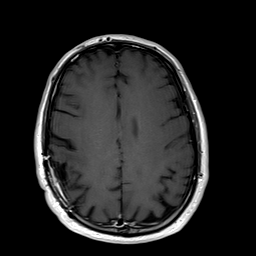
[im 137/160]
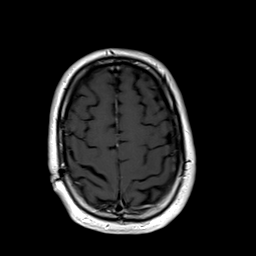
[im 160/160]
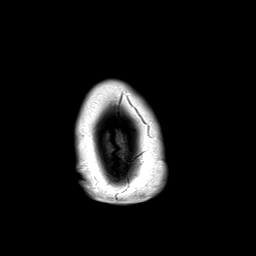

[Series 13: T1 post-contrast · coronal · 3.0mm · 0.69mm/px · 2 of 47 slices shown (2 of 2)]
[im 1/47]
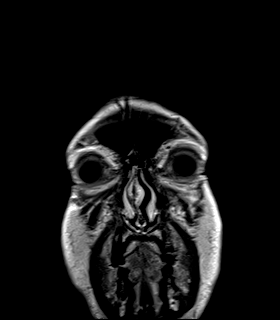
[im 47/47]
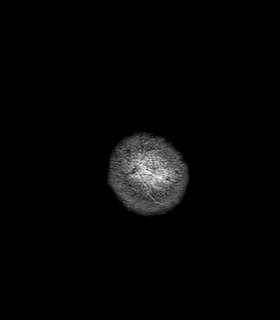

[Series 14: FLAIR post-contrast · sagittal · 3.0mm · 0.75mm/px · 2 of 39 slices shown]
[im 1/39]
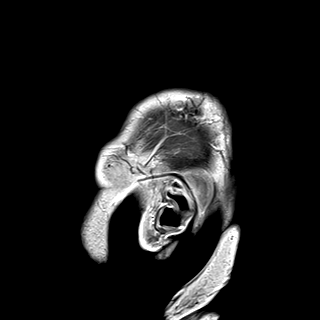
[im 39/39]
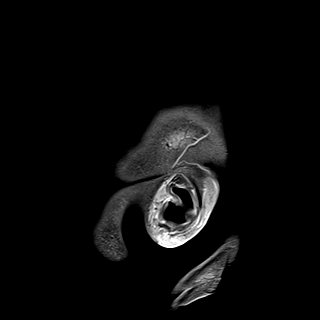

[48 of 48 positions shown; findings below may reference images not displayed]

FINDINGS: BRAIN

New Lesions: None.

Larger lesions: None.

Stable or Smaller lesions:

Right hemispheric resection cavity without worrisome enhancement or
increasing T2 signal that is stable. No abnormal intracranial
enhancement.

Other Brain findings: No incidental infarct, hemorrhage,
hydrocephalus, or collection.

Vascular: Normal flow voids and vascular enhancement

Skull and upper cervical spine: Unchanged 7 mm left parietal bone
lesion that is enhancing and presumably metastatic. Unremarkable
right craniotomy.

Sinuses/Orbits: Unremarkable
IMPRESSION: 1. Stable post treatment appearance of the brain. No evidence of new
or recurrent disease.
2. Stable 7 mm left parietal bone lesion.

## 2021-02-24 MED ORDER — GADOBENATE DIMEGLUMINE 529 MG/ML IV SOLN
16.0000 mL | Freq: Once | INTRAVENOUS | Status: AC | PRN
  Administered 2021-02-24: 16 mL via INTRAVENOUS

## 2021-02-27 ENCOUNTER — Other Ambulatory Visit: Payer: Self-pay

## 2021-02-27 ENCOUNTER — Inpatient Hospital Stay: Payer: Medicare Other | Admitting: Internal Medicine

## 2021-02-27 ENCOUNTER — Inpatient Hospital Stay: Payer: Medicare Other

## 2021-02-27 ENCOUNTER — Ambulatory Visit: Payer: Medicare Other | Admitting: Internal Medicine

## 2021-02-27 VITALS — BP 145/81 | HR 63 | Temp 97.2°F | Resp 15 | Ht 69.0 in | Wt 171.8 lb

## 2021-02-27 DIAGNOSIS — C7931 Secondary malignant neoplasm of brain: Secondary | ICD-10-CM | POA: Diagnosis not present

## 2021-02-27 DIAGNOSIS — C439 Malignant melanoma of skin, unspecified: Secondary | ICD-10-CM | POA: Diagnosis not present

## 2021-02-27 NOTE — Progress Notes (Signed)
[facility] [facility] [place], [place] [address] [phone]   Interval Evaluation  Date of Service: 02/27/21 Patient Name: Walter Rodriguez Patient MRN: [id number] Patient DOB: Feb 08, 1956 Provider: Ventura Sellers, MD  Identifying Statement:  Walter Rodriguez is a 65 y.o. male with Metastasis to brain [facility]) [C79.31]   Primary Cancer:  Oncologic History: Oncology History  Melanoma of skin ([place])  09/07/2020 Initial Diagnosis   Melanoma of skin ([place])   09/22/2020 -  Chemotherapy      Patient is on Antibody Plan: LUNG NIVOLUMAB Q28D    Metastasis to brain [facility])  08/05/2020 Surgery   Craniotomy, resection of 4.7cm R parietal mass with Dr. Marcello Moores; path c/w melanoma    - 08/31/2020 Radiation Therapy   Completes 27Gy/53fx to resection cavity with Dr. Lisbeth Renshaw     Interval History:  Walter Rodriguez presents today for follow up after recent MRI brain.  He describes no new or progressive neurologic deficits.  Denies seizure or headaches since last visit in December.  Continues on monthly nivolumab with Dr. Alen Blew, has upcoming liver MRI.  Walking at least 5 miles per day.    H+P (11/29/20) Patient presented to clinical attention in August 2021 with several weeks history of left arm and leg weakness.  He experienced a fall at home and was unable to get up, which prompted evaluation and CNS imaging.  MRI demonstrated large enhancing mass within right parietal lobe consistent with metastatic disease.  He underwent craniotomy and gross resection with Dr. Marcello Moores on 08/05/20, which was followed by post-op SRS in 3 fractions with Dr. Lisbeth Renshaw, completed on 08/31/20.  He has completed decadron taper.  At present time he has no motor complaints and no subjective neurologic deficits.  Is undergoing rx with Nivolumab with Dr. Alen Blew.  Very active at home, walks and cycles every day.  Medications: Current Outpatient Medications on File Prior to Visit  Medication Sig  Dispense Refill  . Multiple Vitamin (MULTI-VITAMIN) tablet Take 1 tablet by mouth daily.    . prochlorperazine (COMPAZINE) 10 MG tablet Take 1 tablet (10 mg total) by mouth every 6 (six) hours as needed for nausea or vomiting. 30 tablet 0   No current facility-administered medications on file prior to visit.    Allergies: No Known Allergies Past Medical History:  Past Medical History:  Diagnosis Date  . Melanoma ([place])    back  . Open wound    back   Past Surgical History:  Past Surgical History:  Procedure Laterality Date  . APPLICATION OF CRANIAL NAVIGATION N/A 08/05/2020   Procedure: APPLICATION OF CRANIAL NAVIGATION;  Surgeon: Vallarie Mare, MD;  Location: [facility];  Service: Neurosurgery;  Laterality: N/A;  . CRANIOTOMY N/A 08/05/2020   Procedure: CRANIOTOMY TUMOR EXCISION;  Surgeon: Vallarie Mare, MD;  Location: [place];  Service: Neurosurgery;  Laterality: N/A;  . MELANOMA EXCISION WITH SENTINEL LYMPH NODE BIOPSY N/A 04/01/2019   Procedure: WIDE LOCAL EXCISION OF BACK MELANOMA WITH RIGHT AXILLARY SENTINEL LYMPH NODE BIOPSY;  Surgeon: Stark Klein, MD;  Location: [place];  Service: General;  Laterality: N/A;  . SKIN SPLIT GRAFT N/A 04/10/2019   Procedure: SPLIT THICKNESS SKIN GRAFT FROM RIGHT  THIGH TO THE BACK;  Surgeon: Irene Limbo, MD;  Location: [place];  Service: Plastics;  Laterality: N/A;   Social History:  Social History   Socioeconomic History  . Marital status: Single    Spouse name: Not on  file  . Number of children: Not on file  . Years of education: Not on file  . Highest education level: Not on file  Occupational History  . Not on file  Tobacco Use  . Smoking status: Never Smoker  . Smokeless tobacco: Never Used  Vaping Use  . Vaping Use: Never used  Substance and Sexual Activity  . Alcohol use: Never  . Drug use: Never  . Sexual activity: Not on file  Other Topics Concern  . Not on file  Social History Narrative  . Not on file   Social  Determinants of Health   Financial Resource Strain: Not on file  Food Insecurity: Not on file  Transportation Needs: Not on file  Physical Activity: Not on file  Stress: Not on file  Social Connections: Not on file  Intimate Partner Violence: Not on file   Family History: No family history on file.  Review of Systems: Constitutional: Doesn't report fevers, chills or abnormal weight loss Eyes: Doesn't report blurriness of vision Ears, nose, mouth, throat, and face: Doesn't report sore throat Respiratory: Doesn't report cough, dyspnea or wheezes Cardiovascular: Doesn't report palpitation, chest discomfort  Gastrointestinal:  Doesn't report nausea, constipation, diarrhea GU: Doesn't report incontinence Skin: Doesn't report skin rashes Neurological: Per HPI Musculoskeletal: Doesn't report joint pain Behavioral/Psych: Doesn't report anxiety  Physical Exam: Vitals:   02/27/21 1008  BP: (!) 145/81  Pulse: 63  Resp: 15  Temp: (!) 97.2 F (36.2 C)  SpO2: 100%   KPS: 90. General: Alert, cooperative, pleasant, in no acute distress Head: Normal EENT: No conjunctival injection or scleral icterus.  Lungs: Resp effort normal Cardiac: Regular rate Abdomen: Non-distended abdomen Skin: No rashes cyanosis or petechiae. Extremities: No clubbing or edema  Neurologic Exam: Mental Status: Awake, alert, attentive to examiner. Oriented to self and environment. Language is fluent with intact comprehension.  Cranial Nerves: Visual acuity is grossly normal. Visual fields are full. Extra-ocular movements intact. No ptosis. Face is symmetric Motor: Tone and bulk are normal. Power is full in both arms and legs. Reflexes are symmetric, no pathologic reflexes present.  Sensory: Intact to light touch Gait: Normal.   Labs: I have reviewed the data as listed    Component Value Date/Time   NA 139 02/09/2021 0756   K 4.2 02/09/2021 0756   CL 104 02/09/2021 0756   CO2 26 02/09/2021 0756    GLUCOSE 91 02/09/2021 0756   BUN 20 02/09/2021 0756   CREATININE 1.28 (H) 02/09/2021 0756   CREATININE 1.19 07/27/2020 0836   CALCIUM 9.0 02/09/2021 0756   PROT 7.1 02/09/2021 0756   ALBUMIN 4.1 02/09/2021 0756   AST 22 02/09/2021 0756   ALT 24 02/09/2021 0756   ALKPHOS 61 02/09/2021 0756   BILITOT 0.7 02/09/2021 0756   GFRNONAA >60 02/09/2021 0756   GFRNONAA 63 07/27/2020 0836   GFRAA >60 08/07/2020 0429   GFRAA 73 07/27/2020 0836   Lab Results  Component Value Date   WBC 7.3 02/09/2021   NEUTROABS 5.0 02/09/2021   HGB 14.6 02/09/2021   HCT 45.7 02/09/2021   MCV 83.4 02/09/2021   PLT 214 02/09/2021    Imaging:  MR BRAIN W WO CONTRAST  Result Date: 02/25/2021 CLINICAL DATA:  Follow-up metastatic melanoma. History of surgery and radiotherapy in August and September 2021. EXAM: MRI HEAD WITHOUT AND WITH CONTRAST TECHNIQUE: Multiplanar, multiecho pulse sequences of the brain and surrounding structures were obtained without and with intravenous contrast. CONTRAST:  74mL MULTIHANCE GADOBENATE DIMEGLUMINE  529 MG/ML IV SOLN COMPARISON:  11/24/2020 FINDINGS: BRAIN New Lesions: None. Larger lesions: None. Stable or Smaller lesions: Right hemispheric resection cavity without worrisome enhancement or increasing T2 signal that is stable. No abnormal intracranial enhancement. Other Brain findings: No incidental infarct, hemorrhage, hydrocephalus, or collection. Vascular: Normal flow voids and vascular enhancement Skull and upper cervical spine: Unchanged 7 mm left parietal bone lesion that is enhancing and presumably metastatic. Unremarkable right craniotomy. Sinuses/Orbits: Unremarkable IMPRESSION: 1. Stable post treatment appearance of the brain. No evidence of new or recurrent disease. 2. Stable 7 mm left parietal bone lesion. Electronically Signed   By: Monte Fantasia M.D.   On: 02/25/2021 10:57   CT CHEST ABDOMEN PELVIS W CONTRAST  Result Date: 02/08/2021 CLINICAL DATA:  Melanoma  surveillance examination. EXAM: CT CHEST, ABDOMEN, AND PELVIS WITH CONTRAST TECHNIQUE: Multidetector CT imaging of the chest, abdomen and pelvis was performed following the standard protocol during bolus administration of intravenous contrast. CONTRAST:  128mL OMNIPAQUE IOHEXOL 300 MG/ML  SOLN COMPARISON:  Multiple priors including CT chest abdomen pelvis August 02, 2020 FINDINGS: CT CHEST FINDINGS Cardiovascular: Scattered aortic atherosclerosis. No aneurysmal aortic dilation. No central pulmonary embolus. Normal size heart. No pericardial effusion. Mediastinum/Nodes: No mediastinal, hilar or axillary lymphadenopathy. Prior right axillary lymph node dissection. Thyroid is unremarkable. Small hiatal hernia. Lungs/Pleura: No suspicious pulmonary nodules or masses. No pleural effusion. No pneumothorax. Musculoskeletal: Subcentimeter sclerotic focus within the right eighth rib just beyond the costovertebral junction is stable since prior examination and likely a benign bone island. No suspicious lytic or blastic bone lesions are seen within the thorax. CT ABDOMEN PELVIS FINDINGS Hepatobiliary: There is a new ill-defined hypodense hepatic lesion which measures approximately 1.4 cm along the posterior aspect of the right lobe of the liver on image 55/2 in 61/6. Gallbladder is unremarkable. No biliary ductal dilation. Pancreas: Unremarkable. No pancreatic ductal dilatation or surrounding inflammatory changes. Spleen: Normal in size without focal abnormality. Adrenals/Urinary Tract: Adrenal glands are unremarkable. Bilateral renal cysts. Otherwise the kidneys are normal, without renal calculi, solid lesion, or hydronephrosis. Bladder is unremarkable. Stomach/Bowel: Stomach is unremarkable. Small duodenal diverticulum. Normal location of the duodenum/ligament of [place]. Appendix and terminal ileum appear unremarkable. No suspicious colonic wall thickening or mass like lesions. Vascular/Lymphatic: Aortic atherosclerosis. No  enlarged abdominal or pelvic lymph nodes. Reproductive: Prostate is unremarkable. Other: No abdominopelvic ascites. Musculoskeletal: Multilevel degenerative changes spine. No acute or significant osseous findings. IMPRESSION: 1. New ill-defined hypodense hepatic lesion along the posterior aspect of the right lobe of the liver, which is nonspecific but concerning for metastatic disease. Further evaluation with liver protocol MRI with and without IV contrast is recommended. 2. No evidence of metastatic disease within the chest or pelvis and no lymphadenopathy. 3. Aortic atherosclerosis.  Aortic Atherosclerosis (ICD10-I70.0). These results will be called to the ordering clinician or representative by the Radiologist Assistant, and communication documented in the PACS or [employer]. Electronically Signed   By: Dahlia Bailiff MD   On: 02/08/2021 12:38    [place] Clinician Interpretation: I have personally reviewed the radiological images as listed.  My interpretation, in the context of the patient's clinical presentation, is stable disease   Assessment/Plan Brain Metastasis  Walter Rodriguez is clinically and radiographically stable today.  No new or progressive deficits.  He will continue to undergo rx with immunotherapy through Dr. Alen Blew, pending liver MRI next week for suspected metastasis.  We encouraged him to remain active and continue aerobic exercise regimen.  We spent twenty  additional minutes teaching regarding the natural history, biology, and historical experience in the treatment of neurologic complications of cancer.   We appreciate the opportunity to participate in the care of Walter Rodriguez.   We ask that Walter Rodriguez return to clinic in 3 months following next brain MRI, or sooner as needed.  All questions were answered. The patient knows to call the clinic with any problems, questions or concerns. No barriers to learning were detected.  The total time spent in the encounter  was 30 minutes and more than 50% was on counseling and review of test results   Ventura Sellers, MD Medical Director of Neuro-Oncology [facility] 02/27/21 9:55 AM

## 2021-02-28 ENCOUNTER — Other Ambulatory Visit: Payer: Self-pay | Admitting: Radiation Therapy

## 2021-02-28 ENCOUNTER — Ambulatory Visit: Payer: Medicare Other | Admitting: Internal Medicine

## 2021-03-06 ENCOUNTER — Ambulatory Visit
Admission: RE | Admit: 2021-03-06 | Discharge: 2021-03-06 | Disposition: A | Discharge status: Home / Self Care | Payer: Medicare Other | Source: Ambulatory Visit | Attending: Oncology | Admitting: Oncology

## 2021-03-06 DIAGNOSIS — R160 Hepatomegaly, not elsewhere classified: Secondary | ICD-10-CM

## 2021-03-06 DIAGNOSIS — C439 Malignant melanoma of skin, unspecified: Secondary | ICD-10-CM

## 2021-03-06 IMAGING — MR MR ABDOMEN WO/W CM
12 of 17 series · 28 of 48 positions shown · IV contrast (13ml Multihance)
Comparison: CT chest abdomen pelvis, [DATE]

CLINICAL DATA: Melanoma, suspected new liver lesion

EXAM:
MRI ABDOMEN WITHOUT AND WITH CONTRAST
TECHNIQUE: Multiplanar multisequence MR imaging of the abdomen was performed
both before and after the administration of intravenous contrast.
CONTRAST:  13mL MULTIHANCE GADOBENATE DIMEGLUMINE 529 MG/ML IV SOLN

[Series 3: T2 · axial · 6.5mm · 0.74mm/px · 1 of 30 slices shown (1 of 3)]
[im 1/30]
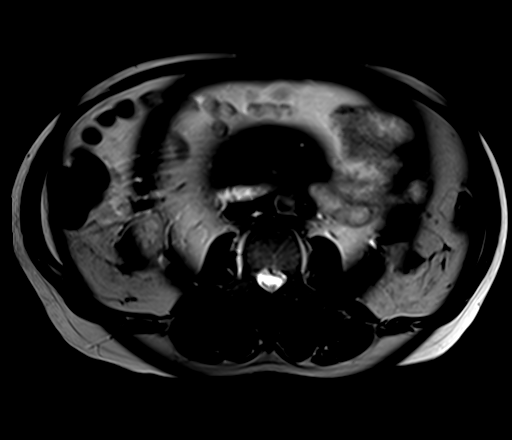

[Series 4: T2 · coronal · 5.0mm · 1.56mm/px · 1 of 36 slices shown (2 of 3)]
[im 1/36]
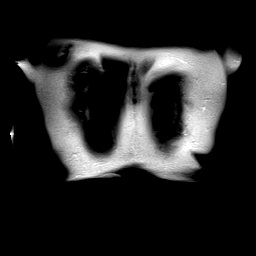

[Series 5: axial tru fisp · axial · 5.0mm · 1.48mm/px · 1 of 40 slices shown]
[im 1/40]
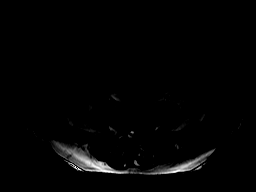

[Series 6: ep2d_[id number]_500_800_p2 · axial · 6.0mm · 1.98mm/px · z∈[-132,+94]mm · 2 of 88 slices shown]
[im 1/88]
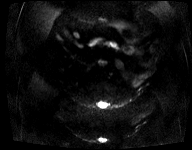
[im 88/88]
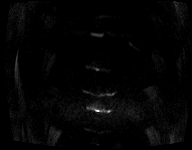

[Series 7: ep2d_[id number]_800_p2_adc · axial · 6.0mm · 1.98mm/px · 1 of 30 slices shown]
[im 1/30]
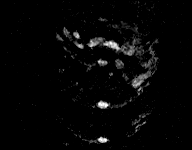

[Series 8: T2 · axial · 5.0mm · 1.48mm/px · 1 of 37 slices shown (3 of 3)]
[im 1/37]
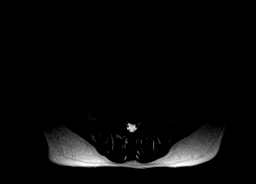

[Series 9: axial in out · axial · 6.0mm · 0.74mm/px · z∈[-186,+41]mm · 2 of 68 slices shown]
[im 1/68]
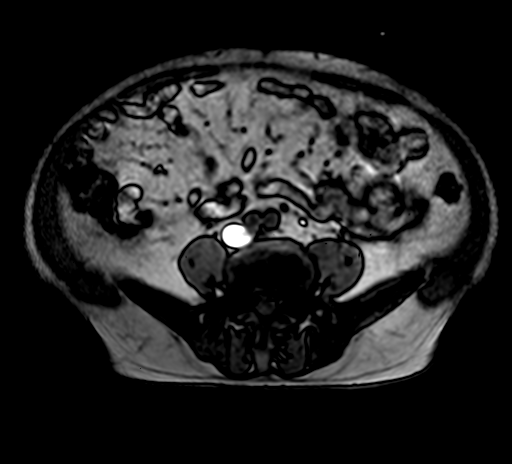
[im 68/68]
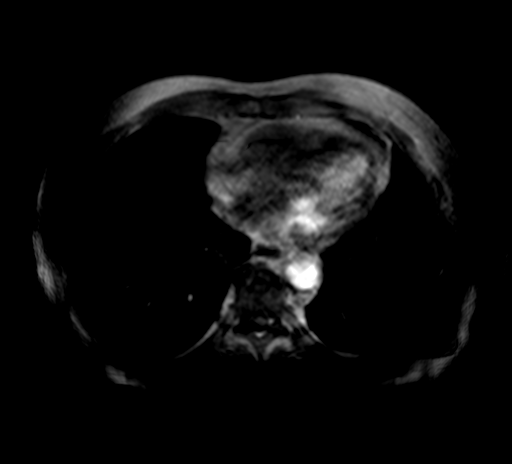

[Series 10: T1 dynamic · axial · non-contrast · 2.2mm · 0.78mm/px · z∈[-186,+41]mm · 4 of 104 slices shown]
[im 1/104]
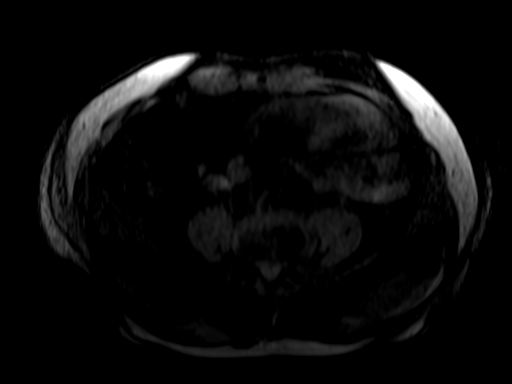
[im 35/104]
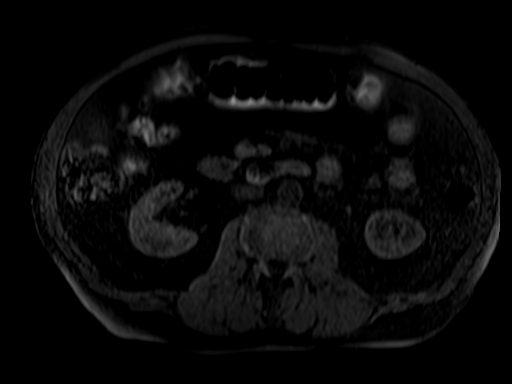
[im 69/104]
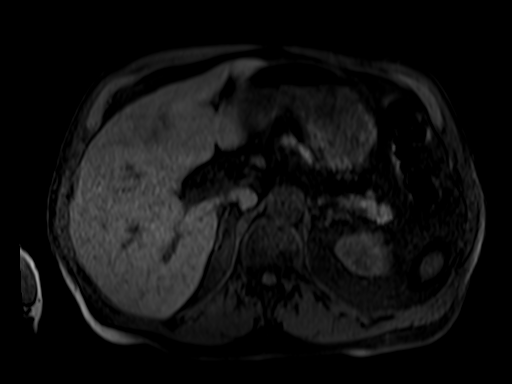
[im 104/104]
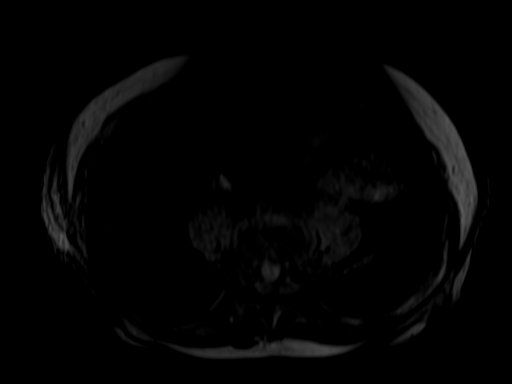

[Series 11: post 25 sec · axial · 2.2mm · 0.78mm/px · z∈[-186,+41]mm · 4 of 104 slices shown]
[im 1/104]
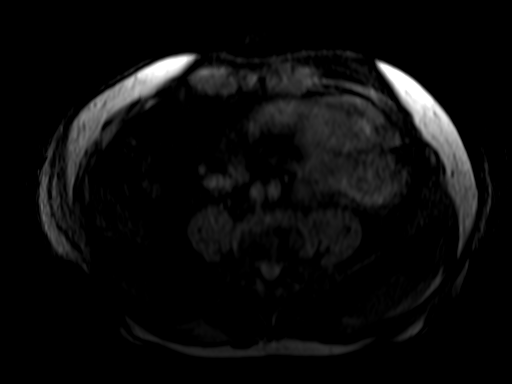
[im 35/104]
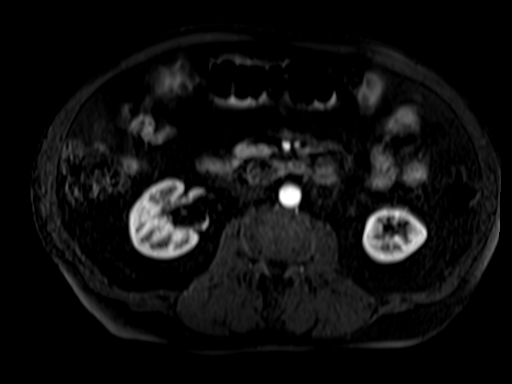
[im 69/104]
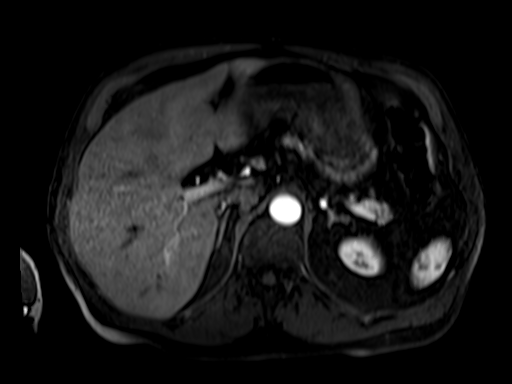
[im 104/104]
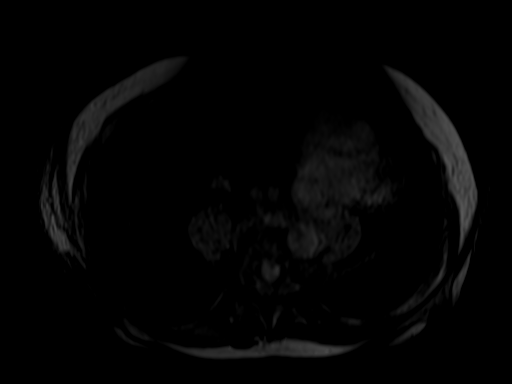

[Series 12: post 25 sec_sub · axial · 2.2mm · 0.78mm/px · z∈[-186,+41]mm · 4 of 104 slices shown]
[im 1/104]
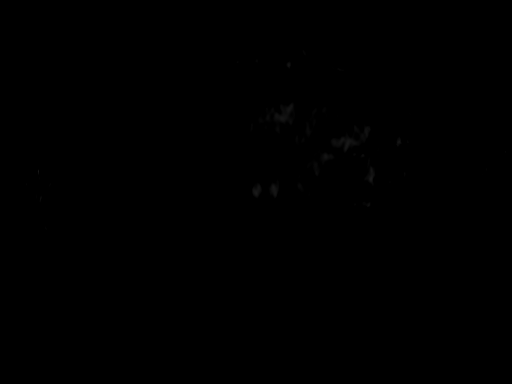
[im 35/104]
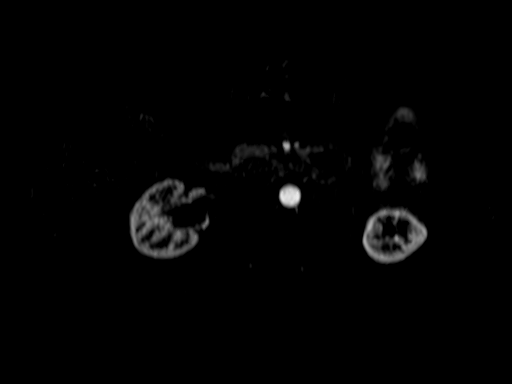
[im 69/104]
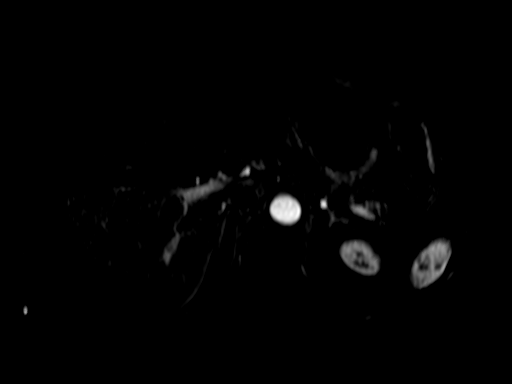
[im 104/104]
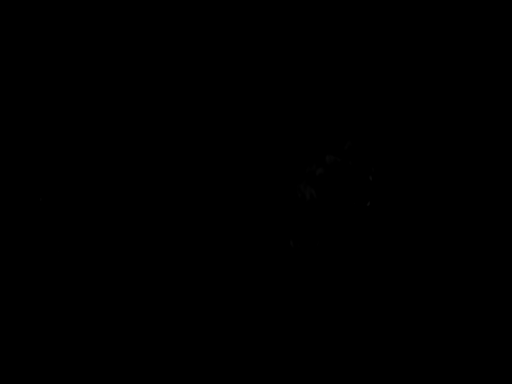

[Series 13: post 45 sec · axial · 2.2mm · 0.78mm/px · z∈[-186,+41]mm · 4 of 104 slices shown]
[im 1/104]
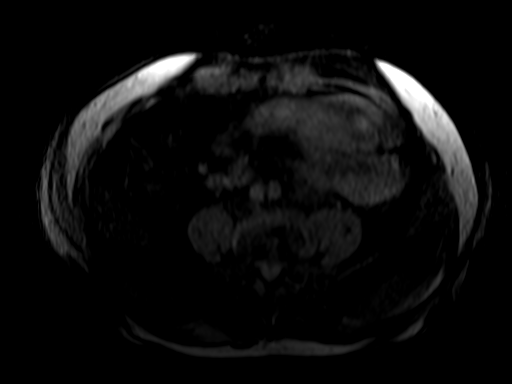
[im 35/104]
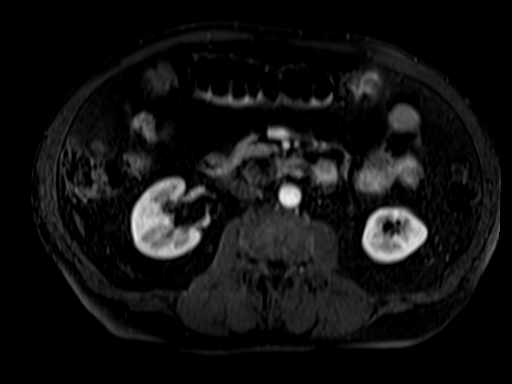
[im 69/104]
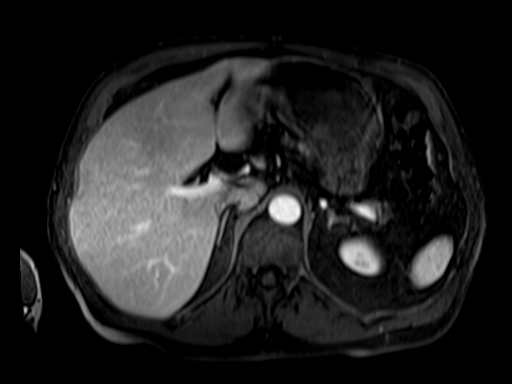
[im 104/104]
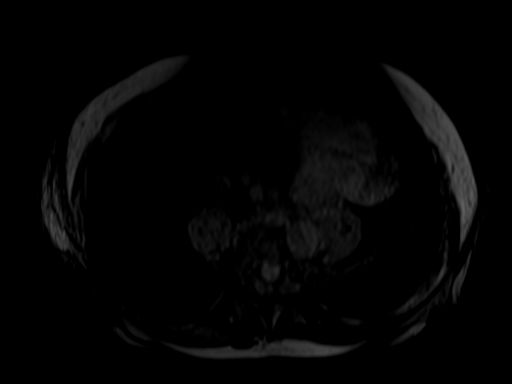

[Series 14: post 45 sec_sub · axial · 2.2mm · 0.78mm/px · z∈[-186,-36]mm · 3 of 104 slices shown]
[im 1/104]
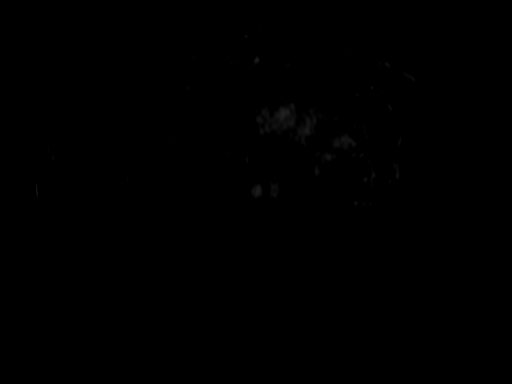
[im 35/104]
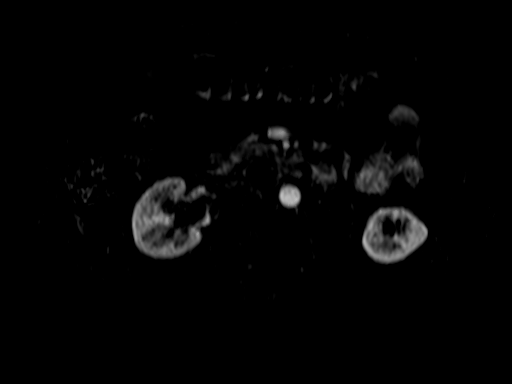
[im 69/104]
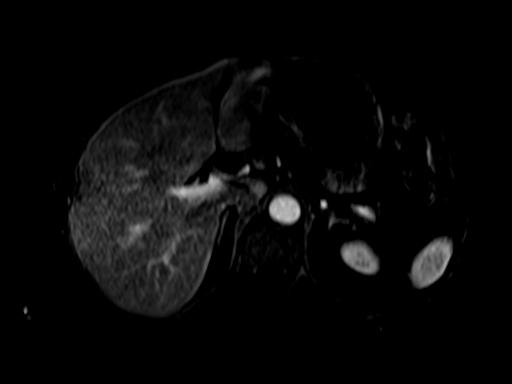

[28 of 48 positions shown; findings below may reference images not displayed]

FINDINGS: Lower chest: No acute findings.

Hepatobiliary: There is a fluid signal, subcapsular lesion of the
posterior liver dome, hepatic segment VII, measuring 1.1 cm and
corresponding to finding of prior CT (series 3, image 10). This
appears to demonstrate very subtle peripheral and fine internal
contrast enhancement on multiphasic sequences (series 15, image 23).
No solid mass, suspicious contrast enhancement or other parenchymal
abnormality identified.

Pancreas: No mass, inflammatory changes, or other parenchymal
abnormality identified.

Spleen:  Within normal limits in size and appearance.

Adrenals/Urinary Tract: No masses identified. Nonenhancing benign
cysts of the bilateral kidneys. No evidence of hydronephrosis.

Stomach/Bowel: Visualized portions within the abdomen are
unremarkable.

Vascular/Lymphatic: No pathologically enlarged lymph nodes
identified. No abdominal aortic aneurysm demonstrated.

Other:  None.

Musculoskeletal: No suspicious bone lesions identified.
IMPRESSION: 1. There is a fluid signal, subcapsular lesion of the posterior
liver dome, hepatic segment VII, measuring 1.1 cm and corresponding
to finding of prior CT. This appears to demonstrate very subtle
peripheral and fine internal contrast enhancement on multiphasic
sequences. Appearance is not definitively benign but highly
suggestive of a small hemangioma, size very close to the lower limit
of lesion characterization. There are no classic MR findings of
metastatic melanoma, such as intrinsic T1 hyperintensity. In
retrospect, this lesion was likely present on prior examinations but
very subtle due to size and location and difficult to appreciate.
Consider follow-up MRI in 3-6 months to ensure stability.

2.  No other evidence of metastatic disease in the abdomen.

## 2021-03-06 MED ORDER — GADOBENATE DIMEGLUMINE 529 MG/ML IV SOLN
13.0000 mL | Freq: Once | INTRAVENOUS | Status: AC | PRN
  Administered 2021-03-06: 13 mL via INTRAVENOUS

## 2021-03-09 ENCOUNTER — Inpatient Hospital Stay: Payer: Medicare Other | Admitting: Oncology

## 2021-03-09 ENCOUNTER — Other Ambulatory Visit: Payer: Self-pay

## 2021-03-09 ENCOUNTER — Inpatient Hospital Stay: Payer: Medicare Other

## 2021-03-09 VITALS — BP 133/82 | HR 72 | Temp 97.3°F | Resp 19 | Ht 69.0 in | Wt 171.4 lb

## 2021-03-09 DIAGNOSIS — C7931 Secondary malignant neoplasm of brain: Secondary | ICD-10-CM | POA: Diagnosis not present

## 2021-03-09 DIAGNOSIS — C439 Malignant melanoma of skin, unspecified: Secondary | ICD-10-CM

## 2021-03-09 LAB — CMP (CANCER CENTER ONLY)
ALT: 24 U/L (ref 0–44)
AST: 21 U/L (ref 15–41)
Albumin: 4.1 g/dL (ref 3.5–5.0)
Alkaline Phosphatase: 60 U/L (ref 38–126)
Anion gap: 13 (ref 5–15)
BUN: 17 mg/dL (ref 8–23)
CO2: 24 mmol/L (ref 22–32)
Calcium: 8.8 mg/dL — ABNORMAL LOW (ref 8.9–10.3)
Chloride: 104 mmol/L (ref 98–111)
Creatinine: 1.19 mg/dL (ref 0.61–1.24)
GFR, Estimated: >60 mL/min (ref >60)
Glucose, Bld: 90 mg/dL (ref 70–99)
Potassium: 4.2 mmol/L (ref 3.5–5.1)
Sodium: 141 mmol/L (ref 135–145)
Total Bilirubin: 0.7 mg/dL (ref 0.3–1.2)
Total Protein: 6.8 g/dL (ref 6.5–8.1)

## 2021-03-09 LAB — CBC WITH DIFFERENTIAL (CANCER CENTER ONLY)
Abs Immature Granulocytes: 0.02 10*3/uL (ref 0.00–0.07)
Basophils Absolute: 0.1 10*3/uL (ref 0.0–0.1)
Basophils Relative: 1 %
Eosinophils Absolute: 0.2 10*3/uL (ref 0.0–0.5)
Eosinophils Relative: 3 %
HCT: 44.1 % (ref 39.0–52.0)
Hemoglobin: 14.7 g/dL (ref 13.0–17.0)
Immature Granulocytes: 0 %
Lymphocytes Relative: 22 %
Lymphs Abs: 1.5 10*3/uL (ref 0.7–4.0)
MCH: 27.6 pg (ref 26.0–34.0)
MCHC: 33.3 g/dL (ref 30.0–36.0)
MCV: 82.9 fL (ref 80.0–100.0)
Monocytes Absolute: 0.5 10*3/uL (ref 0.1–1.0)
Monocytes Relative: 7 %
Neutro Abs: 4.6 10*3/uL (ref 1.7–7.7)
Neutrophils Relative %: 67 %
Platelet Count: 226 10*3/uL (ref 150–400)
RBC: 5.32 MIL/uL (ref 4.22–5.81)
RDW: 13.5 % (ref 11.5–15.5)
WBC Count: 6.8 10*3/uL (ref 4.0–10.5)
nRBC: 0 % (ref 0.0–0.2)

## 2021-03-09 LAB — TSH: TSH: 3.908 u[IU]/mL (ref 0.320–4.118)

## 2021-03-09 MED ORDER — SODIUM CHLORIDE 0.9 % IV SOLN
Freq: Once | INTRAVENOUS | Status: AC
  Filled 2021-03-09: qty 250

## 2021-03-09 MED ORDER — SODIUM CHLORIDE 0.9 % IV SOLN
480.0000 mg | Freq: Once | INTRAVENOUS | Status: AC
  Administered 2021-03-09: 480 mg via INTRAVENOUS
  Filled 2021-03-09: qty 48

## 2021-03-09 NOTE — Patient Instructions (Signed)
[facility] Discharge Instructions for Patients Receiving Chemotherapy  Today you received the following immunotherapy agent: Nivolumab (Opdivo)  To help prevent nausea and vomiting after your treatment, we encourage you to take your nausea medication as directed by your MD.   If you develop nausea and vomiting that is not controlled by your nausea medication, call the clinic.   BELOW ARE SYMPTOMS THAT SHOULD BE REPORTED IMMEDIATELY:  *FEVER GREATER THAN 100.5 F  *CHILLS WITH OR WITHOUT FEVER  NAUSEA AND VOMITING THAT IS NOT CONTROLLED WITH YOUR NAUSEA MEDICATION  *UNUSUAL SHORTNESS OF BREATH  *UNUSUAL BRUISING OR BLEEDING  TENDERNESS IN MOUTH AND THROAT WITH OR WITHOUT PRESENCE OF ULCERS  *URINARY PROBLEMS  *BOWEL PROBLEMS  UNUSUAL RASH Items with * indicate a potential emergency and should be followed up as soon as possible.  Feel free to call the clinic should you have any questions or concerns. The clinic phone number is [phone].  Please show the CHEMO ALERT CARD at check-in to the Emergency Department and triage nurse.   

## 2021-03-09 NOTE — Progress Notes (Signed)
Hematology and Oncology Follow Up Visit  Walter Rodriguez [id number] 1956-08-27 64 y.o. 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    Prior Therapy:  He is status post local wide excision as well as right axillary sentinel lymph node sampling completed by Dr. Barry Dienes on April 01, 2019.  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 Iran Planas on Apr 10, 2019.  He is status post right craniotomy and excision of brain tumor completed on August 05, 2020 by Dr. Marcello Moores.  He status post radiation therapy following his craniotomy completed on August 25, 2020.  He received 27 Gy in 3 fractions utilizing stereotactic radiosurgery.   Current therapy: Nivolumab 480 mg every 4 weeks started on September 22, 2020.  He is here for cycle 7 of therapy.  Interim History: Walter Rodriguez is here for a follow-up visit.  Since the last visit, he reports feeling well without any major complaints.  He denies any nausea, vomiting or change in his bowels.  He denies any respiratory complaints like cough or wheezing (.  He continues to be active and attends activities of daily living.  He had precancerous skin lesions and applied fluorouracil cream with dermatitis associated with it.  No skin cancer diagnosed recently.   Medications: Updated on review. Current Outpatient Medications  Medication Sig Dispense Refill  . Multiple Vitamin (MULTI-VITAMIN) tablet Take 1 tablet by mouth daily.    . prochlorperazine (COMPAZINE) 10 MG tablet Take 1 tablet (10 mg total) by mouth every 6 (six) hours as needed for nausea or vomiting. (Patient not taking: Reported on 02/27/2021) 30 tablet 0   No current facility-administered medications for this  visit.     Allergies: No Known Allergies      Physical Exam:     Blood pressure 133/82, pulse 72, temperature (!) 97.3 F (36.3 C), temperature source Tympanic, resp. rate 19, height 5' 9"  (1.753 m), weight 171 lb 6.4 oz (77.7 kg), SpO2 99 %.   ECOG: 0    General appearance: Alert, awake without any distress. Head: Atraumatic without abnormalities Oropharynx: Without any thrush or ulcers. Eyes: No scleral icterus. Lymph nodes: No lymphadenopathy noted in the cervical, supraclavicular, or axillary nodes Heart:regular rate and rhythm, without any murmurs or gallops.   Lung: Clear to auscultation without any rhonchi, wheezes or dullness to percussion. Abdomin: Soft, nontender without any shifting dullness or ascites. Musculoskeletal: No clubbing or cyanosis. Neurological: No motor or sensory deficits. Skin: Mild erythema noted on his right arm.            Lab Results: Lab Results  Component Value Date   WBC 7.3 02/09/2021   HGB 14.6 02/09/2021   HCT 45.7 02/09/2021   MCV 83.4 02/09/2021   PLT 214 02/09/2021     Chemistry      Component Value Date/Time   NA 139 02/09/2021 0756   K 4.2 02/09/2021 0756   CL 104 02/09/2021 0756   CO2 26 02/09/2021 0756   BUN 20 02/09/2021 0756   CREATININE 1.28 (H) 02/09/2021 0756   CREATININE 1.19 07/27/2020 0836      Component Value Date/Time   CALCIUM 9.0 02/09/2021 0756   ALKPHOS 61 02/09/2021 0756   AST 22  02/09/2021 0756   ALT 24 02/09/2021 0756   BILITOT 0.7 02/09/2021 0756      IMPRESSION: 1. There is a fluid signal, subcapsular lesion of the posterior liver dome, hepatic segment VII, measuring 1.1 cm and corresponding to finding of prior CT. This appears to demonstrate very subtle peripheral and fine internal contrast enhancement on multiphasic sequences. Appearance is not definitively benign but highly suggestive of a small hemangioma, size very close to the lower limit of lesion characterization.  There are no classic MR findings of metastatic melanoma, such as intrinsic T1 hyperintensity. In retrospect, this lesion was likely present on prior examinations but very subtle due to size and location and difficult to appreciate. Consider follow-up MRI in 3-6 months to ensure stability.  2.  No other evidence of metastatic disease in the abdomen.  65 year old with:  1.  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  He has tolerated nivolumab about adjuvant therapy without any major complications.  Staging work-up completed on March 1 of 2022 did not show any clear-cut metastatic disease.  Risks and benefits of continuing nivolumab was discussed.  Complication include nausea, fatigue, pruritus and immune mediated complications were reiterated.  He is agreeable to proceed.   2.  CNS metastasis: MRI of the brain on February 24, 2021 did not show any evidence of relapsed disease.  3.  Dermatology surveillance: He continues to follow with dermatology for routine skin screening exams.  No recurrent melanoma noted.  4.  Immune mediated complications.  He has not experienced any complications.  He is continue to be monitored closely including thyroid disease, pneumonitis, colitis, hepatitis and hypophysitis.  5.  Antiemetics: No nausea or vomiting reported at this time.  Compazine is available to him.  6.  Hepatic lesion: MRI of the liver obtained on March 06, 2021 was reviewed today and discussed with the patient.  The appearance does not appear to be consistent with metastatic melanoma unlikely suggestive of hemangioma.  We will update imaging studies with the next scan to follow-up on that.   7  Follow-up: He will return in 1 month for a repeat follow-up.   30  minutes were spent on this visit.  The time was dedicated to reviewing his disease status, discussing treatment options and outlining future plan of  care.      Zola Button, MD 3/31/20228:20 AM

## 2021-03-10 ENCOUNTER — Telehealth: Payer: Self-pay | Admitting: Oncology

## 2021-03-10 NOTE — Telephone Encounter (Signed)
Scheduled follow-up appointment per 3/31 los. Patient is aware.

## 2021-03-15 ENCOUNTER — Telehealth: Payer: Self-pay | Admitting: *Deleted

## 2021-03-15 NOTE — Telephone Encounter (Signed)
Returned call to patient, informed him Dr. Alen Blew states a second covid booster is ok.  Patient verbalizes understanding.

## 2021-03-15 NOTE — Telephone Encounter (Signed)
-----   Message from Wyatt Portela, MD sent at 03/15/2021 11:56 AM EDT ----- Regarding: RE: Second covid booster Ok to get ----- Message ----- From: Rolene Course, RN Sent: 03/15/2021  11:53 AM EDT To: Wyatt Portela, MD Subject: Second covid booster                           This patient called to ask if it's ok for him to have a second Covid booster.  He is on opdivo, wanted to get your opinion.  Please advise.

## 2021-03-16 ENCOUNTER — Ambulatory Visit: Payer: Medicare Other

## 2021-03-16 NOTE — Progress Notes (Signed)
   Covid-19 Vaccination Clinic  Name:  Walter Rodriguez    MRN: [id number] DOB: August 15, 1956  03/16/2021  [name] was observed post Covid-19 immunization for 15 minutes without incident. He was provided with Vaccine Information Sheet and instruction to access the V-Safe system.   [name] was instructed to call 911 with any severe reactions post vaccine: Marland Kitchen Difficulty breathing  . Swelling of face and throat  . A fast heartbeat  . A bad rash all over body  . Dizziness and weakness

## 2021-04-07 ENCOUNTER — Inpatient Hospital Stay: Payer: Medicare Other | Attending: Oncology

## 2021-04-07 ENCOUNTER — Inpatient Hospital Stay: Payer: Medicare Other

## 2021-04-07 ENCOUNTER — Other Ambulatory Visit: Payer: Self-pay

## 2021-04-07 ENCOUNTER — Inpatient Hospital Stay (HOSPITAL_BASED_OUTPATIENT_CLINIC_OR_DEPARTMENT_OTHER): Payer: Medicare Other | Admitting: Oncology

## 2021-04-07 VITALS — BP 136/67 | HR 70 | Temp 97.4°F | Resp 18 | Ht 69.0 in | Wt 170.5 lb

## 2021-04-07 DIAGNOSIS — C4359 Malignant melanoma of other part of trunk: Secondary | ICD-10-CM | POA: Insufficient documentation

## 2021-04-07 DIAGNOSIS — C439 Malignant melanoma of skin, unspecified: Secondary | ICD-10-CM

## 2021-04-07 DIAGNOSIS — Z79899 Other long term (current) drug therapy: Secondary | ICD-10-CM | POA: Insufficient documentation

## 2021-04-07 DIAGNOSIS — C7931 Secondary malignant neoplasm of brain: Secondary | ICD-10-CM | POA: Diagnosis not present

## 2021-04-07 DIAGNOSIS — Z5112 Encounter for antineoplastic immunotherapy: Secondary | ICD-10-CM | POA: Diagnosis not present

## 2021-04-07 DIAGNOSIS — Z923 Personal history of irradiation: Secondary | ICD-10-CM | POA: Insufficient documentation

## 2021-04-07 DIAGNOSIS — K769 Liver disease, unspecified: Secondary | ICD-10-CM | POA: Diagnosis not present

## 2021-04-07 LAB — CMP (CANCER CENTER ONLY)
ALT: 29 U/L (ref 0–44)
AST: 24 U/L (ref 15–41)
Albumin: 4.1 g/dL (ref 3.5–5.0)
Alkaline Phosphatase: 59 U/L (ref 38–126)
Anion gap: 11 (ref 5–15)
BUN: 18 mg/dL (ref 8–23)
CO2: 25 mmol/L (ref 22–32)
Calcium: 9.3 mg/dL (ref 8.9–10.3)
Chloride: 104 mmol/L (ref 98–111)
Creatinine: 1.19 mg/dL (ref 0.61–1.24)
GFR, Estimated: >60 mL/min (ref >60)
Glucose, Bld: 138 mg/dL — ABNORMAL HIGH (ref 70–99)
Potassium: 4.2 mmol/L (ref 3.5–5.1)
Sodium: 140 mmol/L (ref 135–145)
Total Bilirubin: 0.6 mg/dL (ref 0.3–1.2)
Total Protein: 6.9 g/dL (ref 6.5–8.1)

## 2021-04-07 LAB — TSH: TSH: 3.536 u[IU]/mL (ref 0.320–4.118)

## 2021-04-07 LAB — CBC WITH DIFFERENTIAL (CANCER CENTER ONLY)
Abs Immature Granulocytes: 0.01 10*3/uL (ref 0.00–0.07)
Basophils Absolute: 0.1 10*3/uL (ref 0.0–0.1)
Basophils Relative: 1 %
Eosinophils Absolute: 0.2 10*3/uL (ref 0.0–0.5)
Eosinophils Relative: 3 %
HCT: 43.9 % (ref 39.0–52.0)
Hemoglobin: 14.4 g/dL (ref 13.0–17.0)
Immature Granulocytes: 0 %
Lymphocytes Relative: 30 %
Lymphs Abs: 2.2 10*3/uL (ref 0.7–4.0)
MCH: 27.6 pg (ref 26.0–34.0)
MCHC: 32.8 g/dL (ref 30.0–36.0)
MCV: 84.1 fL (ref 80.0–100.0)
Monocytes Absolute: 0.5 10*3/uL (ref 0.1–1.0)
Monocytes Relative: 6 %
Neutro Abs: 4.5 10*3/uL (ref 1.7–7.7)
Neutrophils Relative %: 60 %
Platelet Count: 230 10*3/uL (ref 150–400)
RBC: 5.22 MIL/uL (ref 4.22–5.81)
RDW: 13.5 % (ref 11.5–15.5)
WBC Count: 7.5 10*3/uL (ref 4.0–10.5)
nRBC: 0 % (ref 0.0–0.2)

## 2021-04-07 MED ORDER — SODIUM CHLORIDE 0.9 % IV SOLN
480.0000 mg | Freq: Once | INTRAVENOUS | Status: AC
  Administered 2021-04-07: 480 mg via INTRAVENOUS
  Filled 2021-04-07: qty 48

## 2021-04-07 MED ORDER — SODIUM CHLORIDE 0.9 % IV SOLN
Freq: Once | INTRAVENOUS | Status: AC
  Filled 2021-04-07: qty 250

## 2021-04-07 NOTE — Patient Instructions (Signed)
[facility]   ?Discharge Instructions: ?Thank you for choosing [facility] to provide your oncology and hematology care.  ? ?If you have a lab appointment with the Cancer Center, please go directly to the Cancer Center and check in at the registration area. ?  ?Wear comfortable clothing and clothing appropriate for easy access to any Portacath or PICC line.  ? ?We strive to give you quality time with your provider. You may need to reschedule your appointment if you arrive late (15 or more minutes).  Arriving late affects you and other patients whose appointments are after yours.  Also, if you miss three or more appointments without notifying the office, you may be dismissed from the clinic at the provider?s discretion.    ?  ?For prescription refill requests, have your pharmacy contact our office and allow 72 hours for refills to be completed.   ? ?Today you received the following chemotherapy and/or immunotherapy agents: Nivolumab (Opdivo)    ?  ?To help prevent nausea and vomiting after your treatment, we encourage you to take your nausea medication as directed. ? ?BELOW ARE SYMPTOMS THAT SHOULD BE REPORTED IMMEDIATELY: ?*FEVER GREATER THAN 100.4 F (38 ?C) OR HIGHER ?*CHILLS OR SWEATING ?*NAUSEA AND VOMITING THAT IS NOT CONTROLLED WITH YOUR NAUSEA MEDICATION ?*UNUSUAL SHORTNESS OF BREATH ?*UNUSUAL BRUISING OR BLEEDING ?*URINARY PROBLEMS (pain or burning when urinating, or frequent urination) ?*BOWEL PROBLEMS (unusual diarrhea, constipation, pain near the anus) ?TENDERNESS IN MOUTH AND THROAT WITH OR WITHOUT PRESENCE OF ULCERS (sore throat, sores in mouth, or a toothache) ?UNUSUAL RASH, SWELLING OR PAIN  ?UNUSUAL VAGINAL DISCHARGE OR ITCHING  ? ?Items with * indicate a potential emergency and should be followed up as soon as possible or go to the Emergency Department if any problems should occur. ? ?Please show the CHEMOTHERAPY ALERT CARD or IMMUNOTHERAPY ALERT CARD at  check-in to the Emergency Department and triage nurse. ? ?Should you have questions after your visit or need to cancel or reschedule your appointment, please contact [facility]  Dept: [phone]  and follow the prompts.  Office hours are 8:00 a.m. to 4:30 p.m. Monday - Friday. Please note that voicemails left after 4:00 p.m. may not be returned until the following business day.  We are closed weekends and major holidays. You have access to a nurse at all times for urgent questions. Please call the main number to the clinic Dept: [phone] and follow the prompts. ? ? ?For any non-urgent questions, you may also contact your provider using MyChart. We now offer e-Visits for anyone 18 and older to request care online for non-urgent symptoms. For details visit [email or web address]. ?  ?Also download the MyChart app! Go to the app store, search "MyChart", open the app, select [facility], and log in with your MyChart username and password. ? ?Due to Covid, a mask is required upon entering the hospital/clinic. If you do not have a mask, one will be given to you upon arrival. For doctor visits, patients may have 1 support person aged 18 or older with them. For treatment visits, patients cannot have anyone with them due to current Covid guidelines and our immunocompromised population.  ? ?

## 2021-04-07 NOTE — Progress Notes (Signed)
Hematology and Oncology Follow Up Visit  Walter Rodriguez [id number] [birth year] 65 y.o. 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    Prior Therapy:  He is status post local wide excision as well as right axillary sentinel lymph node sampling completed by Dr. Barry Dienes on April 01, 2019.  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 Iran Planas on Apr 10, 2019.  He is status post right craniotomy and excision of brain tumor completed on August 05, 2020 by Dr. Marcello Moores.  He status post radiation therapy following his craniotomy completed on August 25, 2020.  He received 27 Gy in 3 fractions utilizing stereotactic radiosurgery.   Current therapy: Nivolumab 480 mg every 4 weeks started on September 22, 2020.  He is here for cycle 8 of therapy.  Interim History: Walter Rodriguez is here for repeat evaluation.  Since the last visit, he reports no major changes in his health.  He continues to tolerate nivolumab without any complaints.  He denies excessive fatigue tiredness or skin rash.  He denies any headaches, blurry vision or seizures.  He denies any weakness or generalized fatigue.   Medications: Updated on review. Current Outpatient Medications  Medication Sig Dispense Refill  . Multiple Vitamin (MULTI-VITAMIN) tablet Take 1 tablet by mouth daily.     No current facility-administered medications for this visit.     Allergies: No Known Allergies      Physical Exam:     Blood pressure 136/67, pulse 70, temperature (!) 97.4 F (36.3 C), temperature source Tympanic, resp. rate 18, height 5' 9"  (1.753 m), weight 170 lb 8 oz (77.3 kg), SpO2 98 %.   ECOG: 0     General appearance: Comfortable appearing without any discomfort Head: Normocephalic without any trauma Oropharynx: Mucous membranes are moist and pink without any thrush or ulcers. Eyes: Pupils are equal and round reactive to light. Lymph nodes: No cervical, supraclavicular, inguinal or axillary lymphadenopathy.   Heart:regular rate and rhythm.  S1 and S2 without leg edema. Lung: Clear without any rhonchi or wheezes.  No dullness to percussion. Abdomin: Soft, nontender, nondistended with good bowel sounds.  No hepatosplenomegaly. Musculoskeletal: No joint deformity or effusion.  Full range of motion noted. Neurological: No deficits noted on motor, sensory and deep tendon reflex exam. Skin: No petechial rash or dryness.  Appeared moist.              Lab Results: Lab Results  Component Value Date   WBC 7.5 04/07/2021   HGB 14.4 04/07/2021   HCT 43.9 04/07/2021   MCV 84.1 04/07/2021   PLT 230 04/07/2021     Chemistry      Component Value Date/Time   NA 140 04/07/2021 1050   K 4.2 04/07/2021 1050   CL 104 04/07/2021 1050   CO2 25 04/07/2021 1050   BUN 18 04/07/2021 1050   CREATININE 1.19 04/07/2021 1050   CREATININE 1.19 07/27/2020 0836      Component Value Date/Time   CALCIUM 9.3 04/07/2021 1050   ALKPHOS 59 04/07/2021 1050   AST 24 04/07/2021 1050   ALT 29 04/07/2021 1050   BILITOT 0.6 04/07/2021 1050       2.  No other evidence of metastatic disease in the abdomen.  65 year old with:  1.  Cutaneous melanoma of the upper back diagnosed in 2020.  He developed stage IV disease with CNS involvement.  He continues to tolerate nivolumab without any major complaints at this time.  Risks and benefits of continuing this treatment were discussed.  Complications that include nausea, fatigue and immune mediated issues were reiterated.  The plan is to complete 12 months of therapy and update his staging scans tentatively in September 2022.  2.  CNS metastasis: No  active disease noted on his previous MRI.  Next MRI is already scheduled for June 2022.  3.  Dermatology surveillance: No recent skin cancer noted at this time.  4.  Immune mediated complications.  I continue to educate him about potential complications including pneumonitis, colitis and thyroid disease.  5.  Antiemetics: Compazine is available to him without any nausea or vomiting.   6.  Hepatic lesion: His work-up has been unrevealing at this time with MRI showed likely benign etiology.  We will continue to monitor for future studies.   7  Follow-up: In 4 weeks for the next cycle of therapy.   30  minutes were dedicated to this encounter.  Time was spent on reviewing disease status, laboratory data review and outlining future plan of care.      Zola Button, MD 4/29/202211:50 AM

## 2021-04-10 ENCOUNTER — Telehealth: Payer: Self-pay | Admitting: Oncology

## 2021-04-10 NOTE — Telephone Encounter (Signed)
Scheduled follow-up appointment per 4/29 los. Patient is aware. 

## 2021-05-05 ENCOUNTER — Inpatient Hospital Stay: Payer: Medicare Other | Attending: Oncology

## 2021-05-05 ENCOUNTER — Inpatient Hospital Stay (HOSPITAL_BASED_OUTPATIENT_CLINIC_OR_DEPARTMENT_OTHER): Payer: Medicare Other | Admitting: Oncology

## 2021-05-05 ENCOUNTER — Inpatient Hospital Stay: Payer: Medicare Other

## 2021-05-05 ENCOUNTER — Other Ambulatory Visit: Payer: Self-pay

## 2021-05-05 VITALS — BP 141/70 | HR 54 | Temp 98.6°F | Resp 16 | Wt 167.9 lb

## 2021-05-05 DIAGNOSIS — C4359 Malignant melanoma of other part of trunk: Secondary | ICD-10-CM | POA: Insufficient documentation

## 2021-05-05 DIAGNOSIS — Z5112 Encounter for antineoplastic immunotherapy: Secondary | ICD-10-CM | POA: Diagnosis not present

## 2021-05-05 DIAGNOSIS — C439 Malignant melanoma of skin, unspecified: Secondary | ICD-10-CM | POA: Diagnosis not present

## 2021-05-05 DIAGNOSIS — C7940 Secondary malignant neoplasm of unspecified part of nervous system: Secondary | ICD-10-CM | POA: Insufficient documentation

## 2021-05-05 DIAGNOSIS — K769 Liver disease, unspecified: Secondary | ICD-10-CM | POA: Diagnosis not present

## 2021-05-05 DIAGNOSIS — Z79899 Other long term (current) drug therapy: Secondary | ICD-10-CM | POA: Diagnosis not present

## 2021-05-05 LAB — CMP (CANCER CENTER ONLY)
ALT: 31 U/L (ref 0–44)
AST: 23 U/L (ref 15–41)
Albumin: 4 g/dL (ref 3.5–5.0)
Alkaline Phosphatase: 56 U/L (ref 38–126)
Anion gap: 10 (ref 5–15)
BUN: 17 mg/dL (ref 8–23)
CO2: 26 mmol/L (ref 22–32)
Calcium: 9.4 mg/dL (ref 8.9–10.3)
Chloride: 104 mmol/L (ref 98–111)
Creatinine: 1.14 mg/dL (ref 0.61–1.24)
GFR, Estimated: >60 mL/min (ref >60)
Glucose, Bld: 99 mg/dL (ref 70–99)
Potassium: 4.2 mmol/L (ref 3.5–5.1)
Sodium: 140 mmol/L (ref 135–145)
Total Bilirubin: 0.6 mg/dL (ref 0.3–1.2)
Total Protein: 6.7 g/dL (ref 6.5–8.1)

## 2021-05-05 LAB — CBC WITH DIFFERENTIAL (CANCER CENTER ONLY)
Abs Immature Granulocytes: 0.02 10*3/uL (ref 0.00–0.07)
Basophils Absolute: 0 10*3/uL (ref 0.0–0.1)
Basophils Relative: 1 %
Eosinophils Absolute: 0.3 10*3/uL (ref 0.0–0.5)
Eosinophils Relative: 4 %
HCT: 42.2 % (ref 39.0–52.0)
Hemoglobin: 14.2 g/dL (ref 13.0–17.0)
Immature Granulocytes: 0 %
Lymphocytes Relative: 27 %
Lymphs Abs: 1.7 10*3/uL (ref 0.7–4.0)
MCH: 28.2 pg (ref 26.0–34.0)
MCHC: 33.6 g/dL (ref 30.0–36.0)
MCV: 83.7 fL (ref 80.0–100.0)
Monocytes Absolute: 0.4 10*3/uL (ref 0.1–1.0)
Monocytes Relative: 6 %
Neutro Abs: 4.1 10*3/uL (ref 1.7–7.7)
Neutrophils Relative %: 62 %
Platelet Count: 207 10*3/uL (ref 150–400)
RBC: 5.04 MIL/uL (ref 4.22–5.81)
RDW: 13.4 % (ref 11.5–15.5)
WBC Count: 6.5 10*3/uL (ref 4.0–10.5)
nRBC: 0 % (ref 0.0–0.2)

## 2021-05-05 LAB — TSH: TSH: 3.455 u[IU]/mL (ref 0.320–4.118)

## 2021-05-05 MED ORDER — SODIUM CHLORIDE 0.9 % IV SOLN
Freq: Once | INTRAVENOUS | Status: AC
  Filled 2021-05-05: qty 250

## 2021-05-05 MED ORDER — SODIUM CHLORIDE 0.9 % IV SOLN
480.0000 mg | Freq: Once | INTRAVENOUS | Status: AC
  Administered 2021-05-05: 480 mg via INTRAVENOUS
  Filled 2021-05-05: qty 48

## 2021-05-05 NOTE — Progress Notes (Signed)
Hematology and Oncology Follow Up Visit  Walter Rodriguez [id number] 01/27/1956 65 y.o. 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  He presented with T4N0, BRAF mutated tumor in 2020.  Prior Therapy:  He is status post local wide excision as well as right axillary sentinel lymph node sampling completed by Dr. Byerly on April 01, 2019.  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 Thimmappa on Apr 10, 2019.  He is status post right craniotomy and excision of brain tumor completed on August 05, 2020 by Dr. Thomas.  He status post radiation therapy following his craniotomy completed on August 25, 2020.  He received 27 Gy in 3 fractions utilizing stereotactic radiosurgery.   Current therapy: Nivolumab 480 mg every 4 weeks started on September 22, 2020.  He is here for cycle 9 of therapy.  Interim History: Walter Rodriguez returns today for a follow-up visit.  Since the last visit, he reports no major changes in his health.  He denies any recent hospitalization or illnesses.  He denies any headaches or blurry vision or neurological deficits.  His performance status and quality of life remains unchanged.  He denies any complications related to nivolumab including pruritus, GI toxicity or fatigue.   Medications: Reviewed without changes. Current Outpatient Medications  Medication Sig Dispense Refill  . Multiple Vitamin (MULTI-VITAMIN) tablet Take 1 tablet by mouth daily.     No current facility-administered medications for this visit.     Allergies: No Known Allergies      Physical Exam:     Blood pressure (!) 141/70, pulse (!) 54, temperature 98.6 F (37 C), temperature source Tympanic, resp. rate 16, weight 167 lb 14.4 oz (76.2 kg), SpO2 100  %.   ECOG: 0     General appearance: Alert, awake without any distress. Head: Atraumatic without abnormalities Oropharynx: Without any thrush or ulcers. Eyes: No scleral icterus. Lymph nodes: No lymphadenopathy noted in the cervical, supraclavicular, or axillary nodes Heart:regular rate and rhythm, without any murmurs or gallops.   Lung: Clear to auscultation without any rhonchi, wheezes or dullness to percussion. Abdomin: Soft, nontender without any shifting dullness or ascites. Musculoskeletal: No clubbing or cyanosis. Neurological: No motor or sensory deficits. Skin: Well-healed lesion on the upper back noted.            Lab Results: Lab Results  Component Value Date   WBC 6.5 05/05/2021   HGB 14.2 05/05/2021   HCT 42.2 05/05/2021   MCV 83.7 05/05/2021   PLT 207 05/05/2021     Chemistry      Component Value Date/Time   NA 140 04/07/2021 1050   K 4.2 04/07/2021 1050   CL 104 04/07/2021 1050   CO2 25 04/07/2021 1050   BUN 18 04/07/2021 1050   CREATININE 1.19 04/07/2021 1050   CREATININE 1.19 07/27/2020 0836      Component Value Date/Time   CALCIUM 9.3 04/07/2021 1050   ALKPHOS 59 04/07/2021 1050   AST 24 04/07/2021 1050   ALT 29 04/07/2021 1050   BILITOT 0.6 04/07/2021 1050         65-year-old with:  1.  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  His disease status was updated today and treatment options were reviewed.    He continues to be on nivolumab adjuvant therapy without any evidence of disease noted on imaging studies.  The plan is to continue with this treatment to complete 12 cycles.  Complications including immune mediated issues, GI toxicity and dermatological issues were reiterated.  He is agreeable to proceed at this time.  2.  CNS metastasis:   Next MRI is already scheduled for June 2022.  He is status post radiation therapy without any complications or issues.  3.  Dermatology  surveillance: I recommended continued dermatology surveillance at this time.  No new lesions noted.  4.  Immune mediated complications.  He has not experienced any complications including pneumonitis, colitis and thyroid disease.  He is complication will continue to be reiterated.  5.  Antiemetics: No nausea or vomiting reported.  Compazine is available to him.   6.  Hepatic lesion: Appears to be benign in etiology based on imaging studies.  We will continue to monitor.   7  Follow-up: He will return next month for the next cycle of therapy.   30  minutes were spent on this visit.  The time was dedicated to reviewing disease status, discussing treatment choices and addressing complications related to his cancer and cancer therapy.      Walter Button, MD 5/27/202211:33 AM

## 2021-05-05 NOTE — Patient Instructions (Signed)
[facility]   ?Discharge Instructions: ?Thank you for choosing [facility] to provide your oncology and hematology care.  ? ?If you have a lab appointment with the Cancer Center, please go directly to the Cancer Center and check in at the registration area. ?  ?Wear comfortable clothing and clothing appropriate for easy access to any Portacath or PICC line.  ? ?We strive to give you quality time with your provider. You may need to reschedule your appointment if you arrive late (15 or more minutes).  Arriving late affects you and other patients whose appointments are after yours.  Also, if you miss three or more appointments without notifying the office, you may be dismissed from the clinic at the provider?s discretion.    ?  ?For prescription refill requests, have your pharmacy contact our office and allow 72 hours for refills to be completed.   ? ?Today you received the following chemotherapy and/or immunotherapy agents: Nivolumab (Opdivo)    ?  ?To help prevent nausea and vomiting after your treatment, we encourage you to take your nausea medication as directed. ? ?BELOW ARE SYMPTOMS THAT SHOULD BE REPORTED IMMEDIATELY: ?*FEVER GREATER THAN 100.4 F (38 ?C) OR HIGHER ?*CHILLS OR SWEATING ?*NAUSEA AND VOMITING THAT IS NOT CONTROLLED WITH YOUR NAUSEA MEDICATION ?*UNUSUAL SHORTNESS OF BREATH ?*UNUSUAL BRUISING OR BLEEDING ?*URINARY PROBLEMS (pain or burning when urinating, or frequent urination) ?*BOWEL PROBLEMS (unusual diarrhea, constipation, pain near the anus) ?TENDERNESS IN MOUTH AND THROAT WITH OR WITHOUT PRESENCE OF ULCERS (sore throat, sores in mouth, or a toothache) ?UNUSUAL RASH, SWELLING OR PAIN  ?UNUSUAL VAGINAL DISCHARGE OR ITCHING  ? ?Items with * indicate a potential emergency and should be followed up as soon as possible or go to the Emergency Department if any problems should occur. ? ?Please show the CHEMOTHERAPY ALERT CARD or IMMUNOTHERAPY ALERT CARD at  check-in to the Emergency Department and triage nurse. ? ?Should you have questions after your visit or need to cancel or reschedule your appointment, please contact  [facility]ENTER [facility]  Dept: [phone]  and follow the prompts.  Office hours are 8:00 a.m. to 4:30 p.m. Monday - Friday. Please note that voicemails left after 4:00 p.m. may not be returned until the following business day.  We are closed weekends and major holidays. You have access to a nurse at all times for urgent questions. Please call the main number to the clinic Dept: [phone] and follow the prompts. ? ? ?For any non-urgent questions, you may also contact your provider using MyChart. We now offer e-Visits for anyone 18 and older to request care online for non-urgent symptoms. For details visit [email or web address]. ?  ?Also download the MyChart app! Go to the app store, search "MyChart", open the app, select , and log in with your MyChart username and password. ? ?Due to Covid, a mask is required upon entering the hospital/clinic. If you do not have a mask, one will be given to you upon arrival. For doctor visits, patients may have 1 support person aged 18 or older with them. For treatment visits, patients cannot have anyone with them due to current Covid guidelines and our immunocompromised population.  ? ?

## 2021-06-01 ENCOUNTER — Inpatient Hospital Stay (HOSPITAL_BASED_OUTPATIENT_CLINIC_OR_DEPARTMENT_OTHER): Payer: Medicare Other | Admitting: Oncology

## 2021-06-01 ENCOUNTER — Inpatient Hospital Stay: Payer: Medicare Other | Attending: Oncology

## 2021-06-01 ENCOUNTER — Other Ambulatory Visit: Payer: Self-pay

## 2021-06-01 ENCOUNTER — Inpatient Hospital Stay: Payer: Medicare Other

## 2021-06-01 VITALS — BP 135/72 | HR 58 | Temp 97.9°F | Resp 18 | Ht 69.0 in | Wt 167.1 lb

## 2021-06-01 DIAGNOSIS — C7931 Secondary malignant neoplasm of brain: Secondary | ICD-10-CM | POA: Diagnosis not present

## 2021-06-01 DIAGNOSIS — C4359 Malignant melanoma of other part of trunk: Secondary | ICD-10-CM | POA: Diagnosis present

## 2021-06-01 DIAGNOSIS — Z5112 Encounter for antineoplastic immunotherapy: Secondary | ICD-10-CM | POA: Insufficient documentation

## 2021-06-01 DIAGNOSIS — C439 Malignant melanoma of skin, unspecified: Secondary | ICD-10-CM

## 2021-06-01 DIAGNOSIS — Z923 Personal history of irradiation: Secondary | ICD-10-CM | POA: Insufficient documentation

## 2021-06-01 DIAGNOSIS — K769 Liver disease, unspecified: Secondary | ICD-10-CM | POA: Diagnosis not present

## 2021-06-01 DIAGNOSIS — Z79899 Other long term (current) drug therapy: Secondary | ICD-10-CM | POA: Diagnosis not present

## 2021-06-01 LAB — CBC WITH DIFFERENTIAL (CANCER CENTER ONLY)
Abs Immature Granulocytes: 0.02 10*3/uL (ref 0.00–0.07)
Basophils Absolute: 0.1 10*3/uL (ref 0.0–0.1)
Basophils Relative: 1 %
Eosinophils Absolute: 0.3 10*3/uL (ref 0.0–0.5)
Eosinophils Relative: 4 %
HCT: 44.4 % (ref 39.0–52.0)
Hemoglobin: 14.5 g/dL (ref 13.0–17.0)
Immature Granulocytes: 0 %
Lymphocytes Relative: 24 %
Lymphs Abs: 1.6 10*3/uL (ref 0.7–4.0)
MCH: 27.5 pg (ref 26.0–34.0)
MCHC: 32.7 g/dL (ref 30.0–36.0)
MCV: 84.3 fL (ref 80.0–100.0)
Monocytes Absolute: 0.4 10*3/uL (ref 0.1–1.0)
Monocytes Relative: 6 %
Neutro Abs: 4.4 10*3/uL (ref 1.7–7.7)
Neutrophils Relative %: 65 %
Platelet Count: 212 10*3/uL (ref 150–400)
RBC: 5.27 MIL/uL (ref 4.22–5.81)
RDW: 13.2 % (ref 11.5–15.5)
WBC Count: 6.7 10*3/uL (ref 4.0–10.5)
nRBC: 0 % (ref 0.0–0.2)

## 2021-06-01 LAB — CMP (CANCER CENTER ONLY)
ALT: 21 U/L (ref 0–44)
AST: 20 U/L (ref 15–41)
Albumin: 3.8 g/dL (ref 3.5–5.0)
Alkaline Phosphatase: 52 U/L (ref 38–126)
Anion gap: 9 (ref 5–15)
BUN: 18 mg/dL (ref 8–23)
CO2: 27 mmol/L (ref 22–32)
Calcium: 9 mg/dL (ref 8.9–10.3)
Chloride: 104 mmol/L (ref 98–111)
Creatinine: 1.21 mg/dL (ref 0.61–1.24)
GFR, Estimated: >60 mL/min (ref >60)
Glucose, Bld: 110 mg/dL — ABNORMAL HIGH (ref 70–99)
Potassium: 4.1 mmol/L (ref 3.5–5.1)
Sodium: 140 mmol/L (ref 135–145)
Total Bilirubin: 0.6 mg/dL (ref 0.3–1.2)
Total Protein: 6.5 g/dL (ref 6.5–8.1)

## 2021-06-01 LAB — TSH: TSH: 2.697 u[IU]/mL (ref 0.320–4.118)

## 2021-06-01 MED ORDER — SODIUM CHLORIDE 0.9 % IV SOLN
480.0000 mg | Freq: Once | INTRAVENOUS | Status: AC
  Administered 2021-06-01: 480 mg via INTRAVENOUS
  Filled 2021-06-01: qty 48

## 2021-06-01 MED ORDER — SODIUM CHLORIDE 0.9 % IV SOLN
Freq: Once | INTRAVENOUS | Status: AC
  Filled 2021-06-01: qty 250

## 2021-06-01 NOTE — Patient Instructions (Signed)
[facility]  Discharge Instructions: Thank you for choosing [facility] to provide your oncology and hematology care.   If you have a lab appointment with the [facility], please go directly to the [facility] and check in at the registration area.   Wear comfortable clothing and clothing appropriate for easy access to any Portacath or PICC line.   We strive to give you quality time with your provider. You may need to reschedule your appointment if you arrive late (15 or more minutes).  Arriving late affects you and other patients whose appointments are after yours.  Also, if you miss three or more appointments without notifying the office, you may be dismissed from the clinic at the provider's discretion.      For prescription refill requests, have your pharmacy contact our office and allow 72 hours for refills to be completed.    Today you received the following chemotherapy and/or immunotherapy agents Opdivo      To help prevent nausea and vomiting after your treatment, we encourage you to take your nausea medication as directed.  BELOW ARE SYMPTOMS THAT SHOULD BE REPORTED IMMEDIATELY: *FEVER GREATER THAN 100.4 F (38 C) OR HIGHER *CHILLS OR SWEATING *NAUSEA AND VOMITING THAT IS NOT CONTROLLED WITH YOUR NAUSEA MEDICATION *UNUSUAL SHORTNESS OF BREATH *UNUSUAL BRUISING OR BLEEDING *URINARY PROBLEMS (pain or burning when urinating, or frequent urination) *BOWEL PROBLEMS (unusual diarrhea, constipation, pain near the anus) TENDERNESS IN MOUTH AND THROAT WITH OR WITHOUT PRESENCE OF ULCERS (sore throat, sores in mouth, or a toothache) UNUSUAL RASH, SWELLING OR PAIN  UNUSUAL VAGINAL DISCHARGE OR ITCHING   Items with * indicate a potential emergency and should be followed up as soon as possible or go to the Emergency Department if any problems should occur.  Please show the CHEMOTHERAPY ALERT CARD or IMMUNOTHERAPY ALERT CARD at check-in to the  Emergency Department and triage nurse.  Should you have questions after your visit or need to cancel or reschedule your appointment, please contact [facility]  Dept: [phone]  and follow the prompts.  Office hours are 8:00 a.m. to 4:30 p.m. Monday - Friday. Please note that voicemails left after 4:00 p.m. may not be returned until the following business day.  We are closed weekends and major holidays. You have access to a nurse at all times for urgent questions. Please call the main number to the clinic Dept: [phone] and follow the prompts.   For any non-urgent questions, you may also contact your provider using MyChart. We now offer e-Visits for anyone 67 and older to request care online for non-urgent symptoms. For details visit [email or web address].   Also download the MyChart app! Go to the app store, search "MyChart", open the app, select [facility], and log in with your MyChart username and password.  Due to Covid, a mask is required upon entering the hospital/clinic. If you do not have a mask, one will be given to you upon arrival. For doctor visits, patients may have 1 support person aged 21 or older with them. For treatment visits, patients cannot have anyone with them due to current Covid guidelines and our immunocompromised population.   Nivolumab injection What is this medication? NIVOLUMAB (nye VOL ue mab) is a monoclonal antibody. It treats certain types of cancer. Some of the cancers treated are colon cancer, head and neck cancer,Hodgkin lymphoma, lung cancer, and melanoma. This medicine may be used for other purposes; ask your health  care provider orpharmacist if you have questions. COMMON BRAND NAME(S): Opdivo What should I tell my care team before I take this medication? They need to know if you have any of these conditions: autoimmune diseases like Crohn's disease, ulcerative colitis, or lupus have had or planning to have an allogeneic  stem cell transplant (uses someone else's stem cells) history of chest radiation history of organ transplant nervous system problems like myasthenia gravis or Guillain-Barre syndrome an unusual or allergic reaction to nivolumab, other medicines, foods, dyes, or preservatives pregnant or trying to get pregnant breast-feeding How should I use this medication? This medicine is for infusion into a vein. It is given by a health careprofessional in a hospital or clinic setting. A special MedGuide will be given to you before each treatment. Be sure to readthis information carefully each time. Talk to your pediatrician regarding the use of this medicine in children. While this drug may be prescribed for children as young as 12 years for selectedconditions, precautions do apply. Overdosage: If you think you have taken too much of this medicine contact apoison control center or emergency room at once. NOTE: This medicine is only for you. Do not share this medicine with others. What if I miss a dose? It is important not to miss your dose. Call your doctor or health careprofessional if you are unable to keep an appointment. What may interact with this medication? Interactions have not been studied. This list may not describe all possible interactions. Give your health care provider a list of all the medicines, herbs, non-prescription drugs, or dietary supplements you use. Also tell them if you smoke, drink alcohol, or use illegaldrugs. Some items may interact with your medicine. What should I watch for while using this medication? This drug may make you feel generally unwell. Continue your course of treatmenteven though you feel ill unless your doctor tells you to stop. You may need blood work done while you are taking this medicine. Do not become pregnant while taking this medicine or for 5 months after stopping it. Women should inform their doctor if they wish to become pregnant or think they might be  pregnant. There is a potential for serious side effects to an unborn child. Talk to your health care professional or pharmacist for more information. Do not breast-feed an infant while taking this medicine orfor 5 months after stopping it. What side effects may I notice from receiving this medication? Side effects that you should report to your doctor or health care professionalas soon as possible: allergic reactions like skin rash, itching or hives, swelling of the face, lips, or tongue breathing problems blood in the urine bloody or watery diarrhea or black, tarry stools changes in emotions or moods changes in vision chest pain cough dizziness feeling faint or lightheaded, falls fever, chills headache with fever, neck stiffness, confusion, loss of memory, sensitivity to light, hallucination, loss of contact with reality, or seizures joint pain mouth sores redness, blistering, peeling or loosening of the skin, including inside the mouth severe muscle pain or weakness signs and symptoms of high blood sugar such as dizziness; dry mouth; dry skin; fruity breath; nausea; stomach pain; increased hunger or thirst; increased urination signs and symptoms of kidney injury like trouble passing urine or change in the amount of urine signs and symptoms of liver injury like dark yellow or brown urine; general ill feeling or flu-like symptoms; light-colored stools; loss of appetite; nausea; right upper belly pain; unusually weak or tired; yellowing of the  eyes or skin swelling of the ankles, feet, hands trouble passing urine or change in the amount of urine unusually weak or tired weight gain or loss Side effects that usually do not require medical attention (report to yourdoctor or health care professional if they continue or are bothersome): bone pain constipation decreased appetite diarrhea muscle pain nausea, vomiting tiredness This list may not describe all possible side effects. Call your  doctor for medical advice about side effects. You may report side effects to FDA at1-800-FDA-1088. Where should I keep my medication? This drug is given in a hospital or clinic and will not be stored at home. NOTE: This sheet is a summary. It may not cover all possible information. If you have questions about this medicine, talk to your doctor, pharmacist, orhealth care provider.  2022 Elsevier/Gold Standard (2020-03-30 10:08:25)

## 2021-06-01 NOTE — Progress Notes (Signed)
Hematology and Oncology Follow Up Visit  Walter Rodriguez [id number] 09-03-1956 65 y.o. 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   Prior Therapy:  He is status post local wide excision as well as right axillary sentinel lymph node sampling completed by Dr. Barry Dienes on April 01, 2019.  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 Iran Planas on Apr 10, 2019.  He is status post right craniotomy and excision of brain tumor completed on August 05, 2020 by Dr. Marcello Moores.  He status post radiation therapy following his craniotomy completed on August 25, 2020.  He received 27 Gy in 3 fractions utilizing stereotactic radiosurgery.   Current therapy: Nivolumab 480 mg every 4 weeks started on September 22, 2020.  He is here for cycle 10 of therapy.  Interim History: [name] presents today for a follow-up evaluation.  Since the last visit, he reports no major changes in his health.  He denies any recent hospitalization or illnesses.  He denies any nausea, vomiting or abdominal pain.  He denies any skin rash or lesions.  He denies any dyspnea on exertion or shortness of breath.  He denies any headaches or any neurological symptoms.  Continues to enjoy excellent quality of life.   Medications: Unchanged on review. Current Outpatient Medications  Medication Sig Dispense Refill   Multiple Vitamin (MULTI-VITAMIN) tablet Take 1 tablet by mouth daily.     No current facility-administered medications for this visit.     Allergies: No Known Allergies      Physical Exam:     Blood pressure 135/72, pulse (!) 58, temperature 97.9 F (36.6 C), temperature source Tympanic, resp. rate 18, height _0  (1.753 m), weight 167 lb 1.6 oz  (75.8 kg), SpO2 100 %.    ECOG: 0    General appearance: Comfortable appearing without any discomfort Head: Normocephalic without any trauma Oropharynx: Mucous membranes are moist and pink without any thrush or ulcers. Eyes: Pupils are equal and round reactive to light. Lymph nodes: No cervical, supraclavicular, inguinal or axillary lymphadenopathy.   Heart:regular rate and rhythm.  S1 and S2 without leg edema. Lung: Clear without any rhonchi or wheezes.  No dullness to percussion. Abdomin: Soft, nontender, nondistended with good bowel sounds.  No hepatosplenomegaly. Musculoskeletal: No joint deformity or effusion.  Full range of motion noted. Neurological: No deficits noted on motor, sensory and deep tendon reflex exam. Skin: No petechial rash or dryness.  Appeared moist.              Lab Results: Lab Results  Component Value Date   WBC 6.5 05/05/2021   HGB 14.2 05/05/2021   HCT 42.2 05/05/2021   MCV 83.7 05/05/2021   PLT 207 05/05/2021     Chemistry      Component Value Date/Time   NA 140 05/05/2021 1107   K 4.2 05/05/2021 1107   CL 104 05/05/2021 1107   CO2 26 05/05/2021 1107   BUN 17 05/05/2021 1107   CREATININE 1.14 05/05/2021 1107   CREATININE 1.19 07/27/2020 0836      Component Value Date/Time   CALCIUM 9.4 05/05/2021 1107   ALKPHOS 56 05/05/2021 1107   AST 23 05/05/2021 1107   ALT 31 05/05/2021 1107   BILITOT  0.6 05/05/2021 1107          65 year old with:  1.  Melanoma of the upper back diagnosed in 2020.  He developed stage IV disease with CNS involvement in 2021  He continues to receive nivolumab adjuvant therapy after his primary metastasis has been resected.  Risks and benefits of completing at least a year of immunotherapy were discussed.  Complications that include nausea, fatigue, immune mediated issues were reiterated.  The plan is to update his staging scans and September 2022.  BRAF targeted therapy would be used as a salvage if he  developed recurrent disease.  He is agreeable to proceed at this time.  The plan is to complete 12 cycles of therapy with the last treatment tentatively in August 2022.   2.  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  MRI is scheduled in the near future for surveillance purposes.  3.  Dermatology surveillance: Continues to follow with dermatology without any issues.  4.  Immune mediated complications.  I continue to educate him about potential complications.  These would include pneumonitis, colitis and thyroid disease.  5.  Antiemetics: Compazine is available to him without any nausea or vomiting.   6.  Hepatic lesion: MRI of the liver in March 2022 indicates more of a benign etiology.  We will continue to monitor on future scans.   7  Follow-up: In 4 weeks for the next cycle of therapy.   30  minutes were dedicated to this encounter.  The time was spent on reviewing his disease status, addressing future treatment options, complications related to his cancer and cancer therapy.      Zola Button, MD 6/23/20227:53 AM

## 2021-06-06 ENCOUNTER — Other Ambulatory Visit: Payer: Self-pay

## 2021-06-06 ENCOUNTER — Ambulatory Visit
Admission: RE | Admit: 2021-06-06 | Discharge: 2021-06-06 | Disposition: A | Discharge status: Home / Self Care | Payer: Medicare Other | Source: Ambulatory Visit | Attending: Internal Medicine | Admitting: Internal Medicine

## 2021-06-06 DIAGNOSIS — C7931 Secondary malignant neoplasm of brain: Secondary | ICD-10-CM

## 2021-06-06 IMAGING — MR MR HEAD WO/W CM
12 series · 48 of 48 positions shown · IV contrast (15 mL Multihance)
Comparison: [DATE].  [DATE].

CLINICAL DATA: Follow-up metastatic disease. Melanoma. Previous
radiation.

EXAM:
MRI HEAD WITHOUT AND WITH CONTRAST
TECHNIQUE: Multiplanar, multiecho pulse sequences of the brain and surrounding
structures were obtained without and with intravenous contrast.
CONTRAST:  15mL MULTIHANCE GADOBENATE DIMEGLUMINE 529 MG/ML IV SOLN

[Series 2: FLAIR · sagittal · 3.0mm · 0.78mm/px · 3 of 39 slices shown (1 of 2)]
[im 1/39]
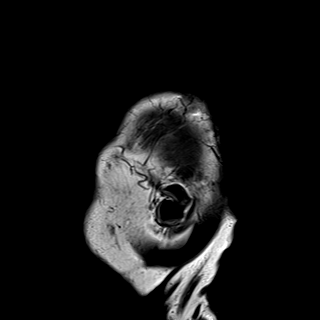
[im 20/39]
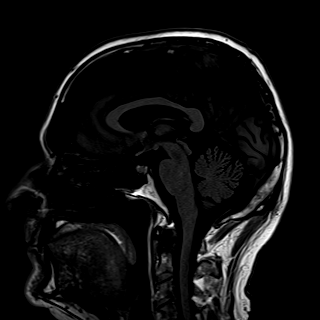
[im 39/39]
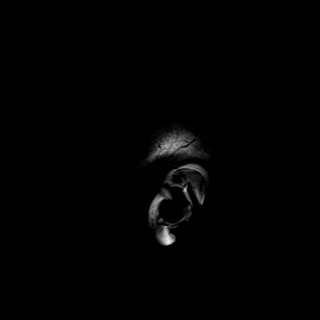

[Series 3: DWI · axial · 3.0mm · 1.56mm/px · z∈[-67,+93]mm · 4 of 84 slices shown (1 of 2)]
[im 1/84]
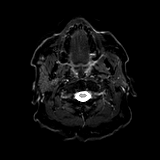
[im 28/84]
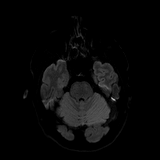
[im 56/84]
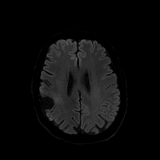
[im 84/84]
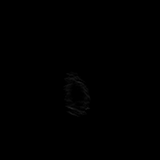

[Series 4: DWI · axial · 3.0mm · 1.56mm/px · z∈[-67,+93]mm · 2 of 38 slices shown (2 of 2)]
[im 1/38]
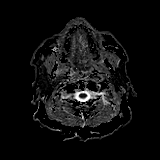
[im 38/38]
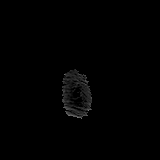

[Series 5: T2 · axial · 5.0mm · 0.75mm/px · 1 of 27 slices shown (1 of 2)]
[im 1/27]
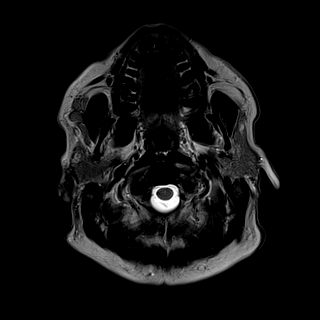

[Series 6: swi_images · axial · 1.5mm · 0.94mm/px · z∈[-69,+97]mm · 5 of 112 slices shown]
[im 1/112]
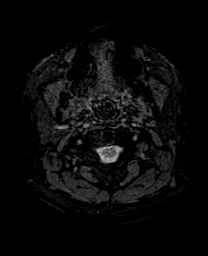
[im 28/112]
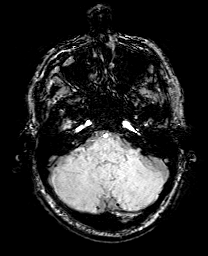
[im 56/112]
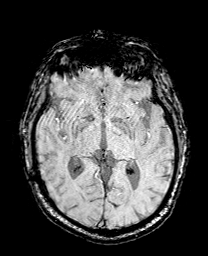
[im 84/112]
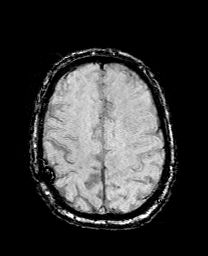
[im 112/112]
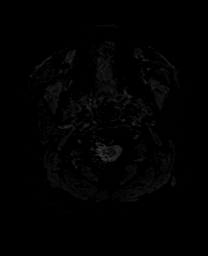

[Series 8: FLAIR · axial · 3.0mm · 0.86mm/px · z∈[-82,+110]mm · 3 of 65 slices shown (2 of 2)]
[im 1/65]
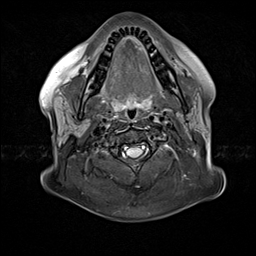
[im 33/65]
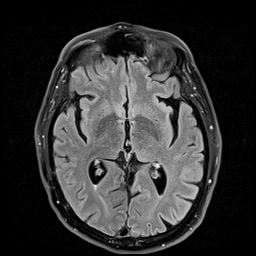
[im 65/65]
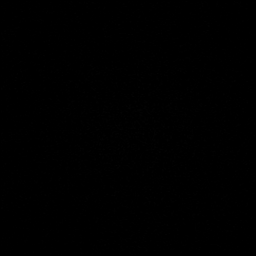

[Series 9: T2 · axial · non-contrast · 1.0mm · 0.94mm/px · z∈[-71,+100]mm · 8 of 176 slices shown (2 of 2)]
[im 1/176]
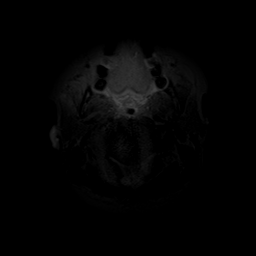
[im 26/176]
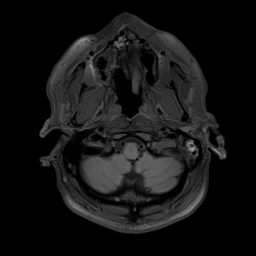
[im 51/176]
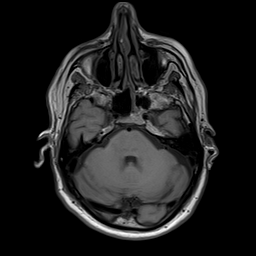
[im 76/176]
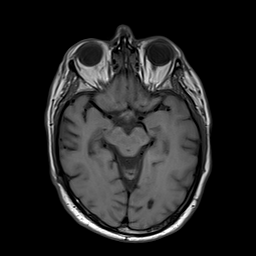
[im 101/176]
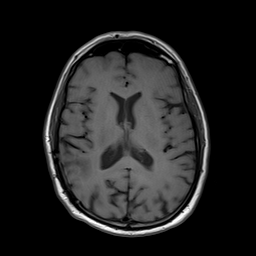
[im 126/176]
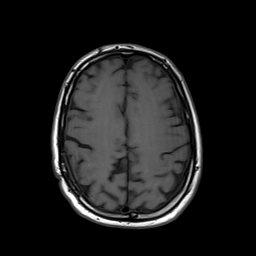
[im 151/176]
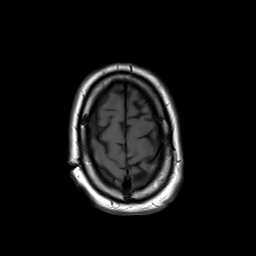
[im 176/176]
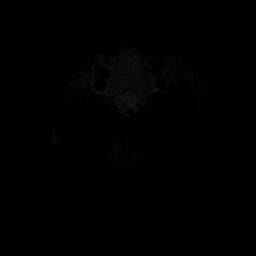

[Series 10: T2 post-contrast · coronal · 3.0mm · 0.69mm/px · 2 of 46 slices shown (1 of 2)]
[im 1/46]
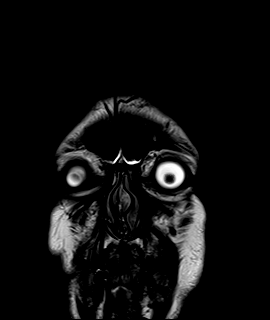
[im 46/46]
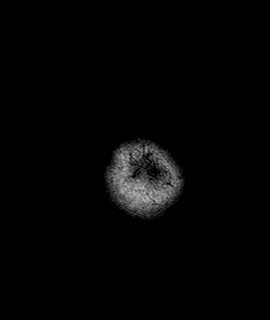

[Series 11: T2 post-contrast · axial · 1.0mm · 0.94mm/px · z∈[-71,+100]mm · 8 of 176 slices shown (2 of 2)]
[im 1/176]
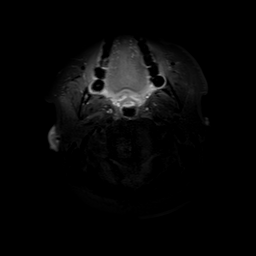
[im 26/176]
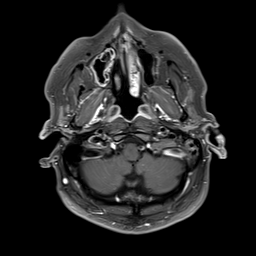
[im 51/176]
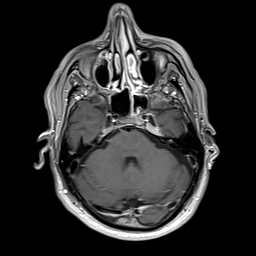
[im 76/176]
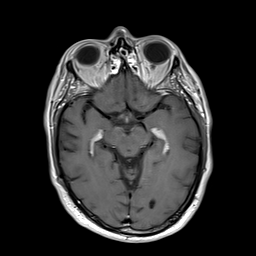
[im 101/176]
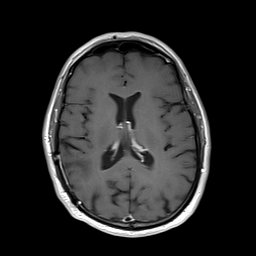
[im 126/176]
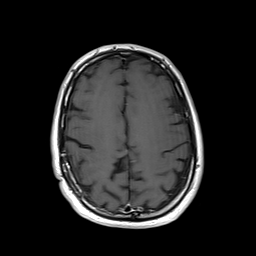
[im 151/176]
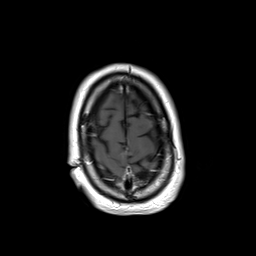
[im 176/176]
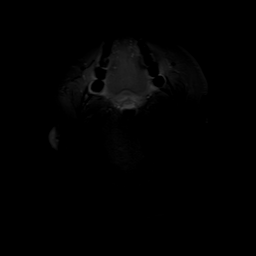

[Series 12: T1 post-contrast · axial · 1.0mm · 0.75mm/px · z∈[-65,+94]mm · 8 of 160 slices shown (1 of 2)]
[im 1/160]
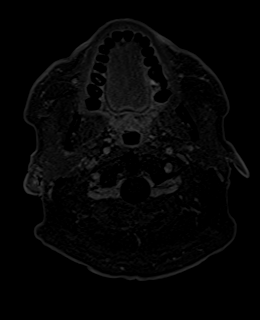
[im 23/160]
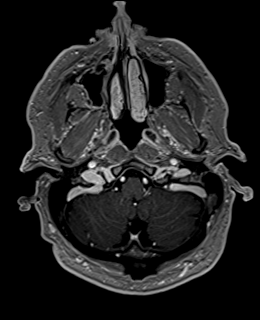
[im 46/160]
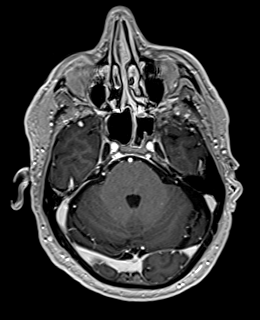
[im 69/160]
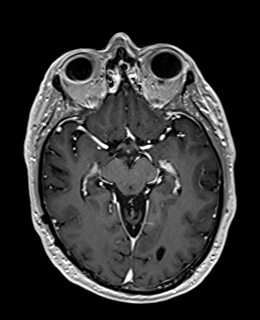
[im 91/160]
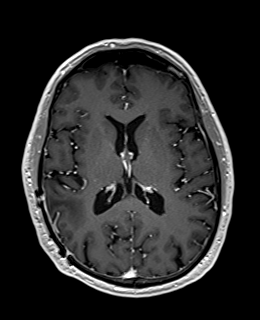
[im 114/160]
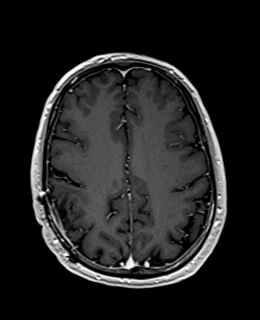
[im 137/160]
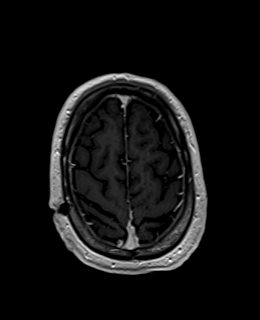
[im 160/160]
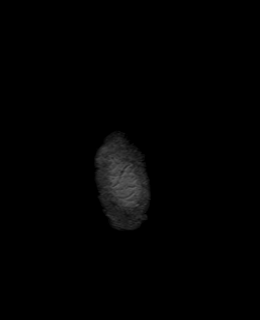

[Series 13: T1 post-contrast · coronal · 3.0mm · 0.69mm/px · 2 of 46 slices shown (2 of 2)]
[im 1/46]
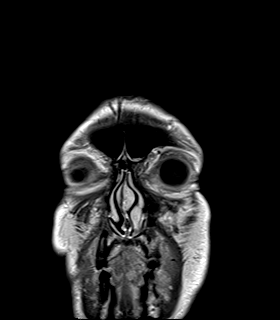
[im 46/46]
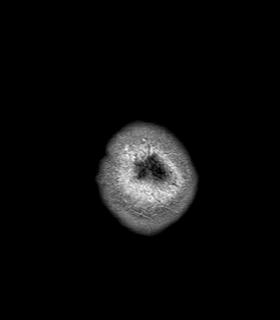

[Series 14: FLAIR post-contrast · sagittal · 3.0mm · 0.78mm/px · 2 of 39 slices shown]
[im 1/39]
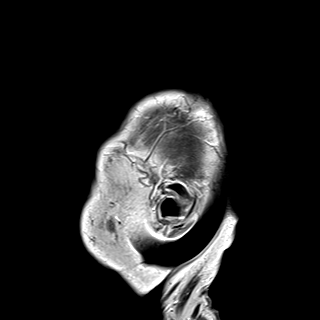
[im 39/39]
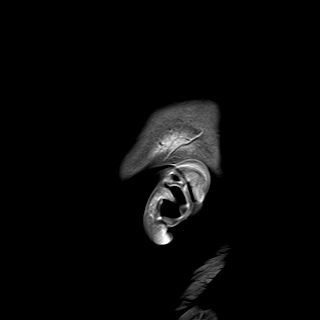

[48 of 48 positions shown; findings below may reference images not displayed]

FINDINGS: Brain: Diffusion imaging does not show any acute or subacute
infarction or other cause of restricted diffusion. No abnormality
affects the brainstem or cerebellum. Previous right parietal
craniotomy and tumor resection. Post resection space is stable.
Gliotic signal in the adjacent white matter is stable. Minimal
linear enhancement along the margins of the resection is stable and
not worrisome. No evidence of new brain metastasis. No hydrocephalus
or extra-axial collection.

Vascular: Major vessels at the base of the brain show flow.

Skull and upper cervical spine: Stable 7 mm marrow focus in the left
parietal bone. By CT, this looks like a hemangioma.

Sinuses/Orbits: Clear except for mild mucosal thickening. Orbits
negative.

Other: None
IMPRESSION: No evidence of residual or recurrent disease at the site of the
resected and radiated lesion in the right parietal brain.

No new cranial or intracranial lesion.

7 mm marrow focus in the left parietal bone is favored to represent
a hemangioma based on the CT appearance and stability.

## 2021-06-06 MED ORDER — GADOBENATE DIMEGLUMINE 529 MG/ML IV SOLN
15.0000 mL | Freq: Once | INTRAVENOUS | Status: AC | PRN
  Administered 2021-06-06: 15 mL via INTRAVENOUS

## 2021-06-08 ENCOUNTER — Inpatient Hospital Stay (HOSPITAL_BASED_OUTPATIENT_CLINIC_OR_DEPARTMENT_OTHER): Payer: Medicare Other | Admitting: Internal Medicine

## 2021-06-08 ENCOUNTER — Other Ambulatory Visit: Payer: Self-pay

## 2021-06-08 VITALS — BP 135/68 | HR 65 | Temp 98.5°F | Resp 18 | Ht 69.0 in | Wt 167.8 lb

## 2021-06-08 DIAGNOSIS — C7931 Secondary malignant neoplasm of brain: Secondary | ICD-10-CM

## 2021-06-08 DIAGNOSIS — C4359 Malignant melanoma of other part of trunk: Secondary | ICD-10-CM | POA: Diagnosis not present

## 2021-06-08 NOTE — Progress Notes (Signed)
[facility] [place] [address], [place] [address] [phone]   Interval Evaluation  Date of Service: 06/08/21 Patient Name: Walter Rodriguez Patient MRN: [id number] Patient DOB: 01-19-1956 Provider: Ventura Sellers, MD  Identifying Statement:  Walter Rodriguez is a 65 y.o. male with Metastasis to brain [facility]) [C79.31]   Primary Cancer:  Oncologic History: Oncology History  Melanoma of skin ([place])  09/07/2020 Initial Diagnosis   Melanoma of skin ([place])    09/22/2020 -  Chemotherapy      Patient is on Antibody Plan: LUNG NIVOLUMAB Q28D     Metastasis to brain [facility])  08/05/2020 Surgery   Craniotomy, resection of 4.7cm R parietal mass with Dr. Marcello Moores; path c/w melanoma    - 08/31/2020 Radiation Therapy   Completes 27Gy/63fx to resection cavity with Dr. Lisbeth Renshaw     Interval History:  Walter Rodriguez presents today for follow up after recent MRI brain.  No new or progressive neurologic deficits, no return of left sided weakness.  Continues on monthly nivolumab with Dr. Alen Blew.  Cycling or walking at least 5 miles per day.    H+P (11/29/20) Patient presented to clinical attention in August 2021 with several weeks history of left arm and leg weakness.  He experienced a fall at home and was unable to get up, which prompted evaluation and CNS imaging.  MRI demonstrated large enhancing mass within right parietal lobe consistent with metastatic disease.  He underwent craniotomy and gross resection with Dr. Marcello Moores on 08/05/20, which was followed by post-op SRS in 3 fractions with Dr. Lisbeth Renshaw, completed on 08/31/20.  He has completed decadron taper.  At present time he has no motor complaints and no subjective neurologic deficits.  Is undergoing rx with Nivolumab with Dr. Alen Blew.  Very active at home, walks and cycles every day.  Medications: Current Outpatient Medications on File Prior to Visit  Medication Sig Dispense Refill   Multiple Vitamin  (MULTI-VITAMIN) tablet Take 1 tablet by mouth daily.     No current facility-administered medications on file prior to visit.    Allergies: No Known Allergies Past Medical History:  Past Medical History:  Diagnosis Date   Melanoma ([place])    back   Open wound    back   Past Surgical History:  Past Surgical History:  Procedure Laterality Date   APPLICATION OF CRANIAL NAVIGATION N/A 08/05/2020   Procedure: APPLICATION OF CRANIAL NAVIGATION;  Surgeon: Vallarie Mare, MD;  Location: [facility];  Service: Neurosurgery;  Laterality: N/A;   CRANIOTOMY N/A 08/05/2020   Procedure: CRANIOTOMY TUMOR EXCISION;  Surgeon: Vallarie Mare, MD;  Location: [facility];  Service: Neurosurgery;  Laterality: N/A;   MELANOMA EXCISION WITH SENTINEL LYMPH NODE BIOPSY N/A 04/01/2019   Procedure: WIDE LOCAL EXCISION OF BACK MELANOMA WITH RIGHT AXILLARY SENTINEL LYMPH NODE BIOPSY;  Surgeon: Stark Klein, MD;  Location: [facility];  Service: General;  Laterality: N/A;   SKIN SPLIT GRAFT N/A 04/10/2019   Procedure: SPLIT THICKNESS SKIN GRAFT FROM RIGHT  THIGH TO THE BACK;  Surgeon: Irene Limbo, MD;  Location: [facility];  Service: Plastics;  Laterality: N/A;   Social History:  Social History   Socioeconomic History   Marital status: Single    Spouse name: Not on file   Number of children: Not on file   Years of education: Not on file   Highest education level: Not on file  Occupational History   Not on file  Tobacco Use   Smoking status: Never   Smokeless tobacco: Never  Vaping Use   Vaping Use: Never used  Substance and Sexual Activity   Alcohol use: Never   Drug use: Never   Sexual activity: Not on file  Other Topics Concern   Not on file  Social History Narrative   Not on file   Social Determinants of Health   Financial Resource Strain: Not on file  Food Insecurity: Not on file  Transportation Needs: Not on file  Physical Activity: Not on file  Stress: Not on file  Social Connections: Not on file   Intimate Partner Violence: Not on file   Family History: No family history on file.  Review of Systems: Constitutional: Doesn't report fevers, chills or abnormal weight loss Eyes: Doesn't report blurriness of vision Ears, nose, mouth, throat, and face: Doesn't report sore throat Respiratory: Doesn't report cough, dyspnea or wheezes Cardiovascular: Doesn't report palpitation, chest discomfort  Gastrointestinal:  Doesn't report nausea, constipation, diarrhea GU: Doesn't report incontinence Skin: Doesn't report skin rashes Neurological: Per HPI Musculoskeletal: Doesn't report joint pain Behavioral/Psych: Doesn't report anxiety  Physical Exam: Vitals:   06/08/21 1121  BP: 135/68  Pulse: 65  Resp: 18  Temp: 98.5 F (36.9 C)  SpO2: 99%   KPS: 90. General: Alert, cooperative, pleasant, in no acute distress Head: Normal EENT: No conjunctival injection or scleral icterus.  Lungs: Resp effort normal Cardiac: Regular rate Abdomen: Non-distended abdomen Skin: No rashes cyanosis or petechiae. Extremities: No clubbing or edema  Neurologic Exam: Mental Status: Awake, alert, attentive to examiner. Oriented to self and environment. Language is fluent with intact comprehension.  Cranial Nerves: Visual acuity is grossly normal. Visual fields are full. Extra-ocular movements intact. No ptosis. Face is symmetric Motor: Tone and bulk are normal. Power is full in both arms and legs. Reflexes are symmetric, no pathologic reflexes present.  Sensory: Intact to light touch Gait: Normal.   Labs: I have reviewed the data as listed    Component Value Date/Time   NA 140 06/01/2021 0740   K 4.1 06/01/2021 0740   CL 104 06/01/2021 0740   CO2 27 06/01/2021 0740   GLUCOSE 110 (H) 06/01/2021 0740   BUN 18 06/01/2021 0740   CREATININE 1.21 06/01/2021 0740   CREATININE 1.19 07/27/2020 0836   CALCIUM 9.0 06/01/2021 0740   PROT 6.5 06/01/2021 0740   ALBUMIN 3.8 06/01/2021 0740   AST 20  06/01/2021 0740   ALT 21 06/01/2021 0740   ALKPHOS 52 06/01/2021 0740   BILITOT 0.6 06/01/2021 0740   GFRNONAA >60 06/01/2021 0740   GFRNONAA 63 07/27/2020 0836   GFRAA >60 08/07/2020 0429   GFRAA 73 07/27/2020 0836   Lab Results  Component Value Date   WBC 6.7 06/01/2021   NEUTROABS 4.4 06/01/2021   HGB 14.5 06/01/2021   HCT 44.4 06/01/2021   MCV 84.3 06/01/2021   PLT 212 06/01/2021    Imaging:  MR BRAIN W WO CONTRAST  Result Date: 06/06/2021 CLINICAL DATA:  Follow-up metastatic disease. Melanoma. Previous radiation. EXAM: MRI HEAD WITHOUT AND WITH CONTRAST TECHNIQUE: Multiplanar, multiecho pulse sequences of the brain and surrounding structures were obtained without and with intravenous contrast. CONTRAST:  67mL MULTIHANCE GADOBENATE DIMEGLUMINE 529 MG/ML IV SOLN COMPARISON:  02/24/2021.  11/24/2020. FINDINGS: Brain: Diffusion imaging does not show any acute or subacute infarction or other cause of restricted diffusion. No abnormality affects the brainstem or cerebellum. Previous right parietal craniotomy and tumor resection. Post resection space is  stable. Gliotic signal in the adjacent white matter is stable. Minimal linear enhancement along the margins of the resection is stable and not worrisome. No evidence of new brain metastasis. No hydrocephalus or extra-axial collection. Vascular: Major vessels at the base of the brain show flow. Skull and upper cervical spine: Stable 7 mm marrow focus in the left parietal bone. By CT, this looks like a hemangioma. Sinuses/Orbits: Clear except for mild mucosal thickening. Orbits negative. Other: None IMPRESSION: No evidence of residual or recurrent disease at the site of the resected and radiated lesion in the right parietal brain. No new cranial or intracranial lesion. 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 Electronically Signed   By: Nelson Chimes M.D.   On:  06/06/2021 11:54     [facility] Clinician Interpretation: I have personally reviewed the radiological images as listed.  My interpretation, in the context of the patient's clinical presentation, is stable disease   Assessment/Plan Brain Metastasis  Walter Rodriguez is clinically and radiographically stable today.  New new or progressive deficits.  We encouraged him to remain active and continue aerobic exercise regimen.  We spent twenty additional minutes teaching regarding the natural history, biology, and historical experience in the treatment of neurologic complications of cancer.   We appreciate the opportunity to participate in the care of Walter Rodriguez.   We ask that Walter Rodriguez return to clinic in 4 months following next brain MRI, or sooner as needed.  All questions were answered. The patient knows to call the clinic with any problems, questions or concerns. No barriers to learning were detected.  The total time spent in the encounter was 30 minutes and more than 50% was on counseling and review of test results   Ventura Sellers, MD Medical Director of Neuro-Oncology [facility] 06/08/21 11:03 AM

## 2021-06-09 ENCOUNTER — Telehealth: Payer: Self-pay | Admitting: Oncology

## 2021-06-09 NOTE — Telephone Encounter (Signed)
Called patient regarding upcoming July and August appointments, patient is notified. 

## 2021-06-13 ENCOUNTER — Other Ambulatory Visit: Payer: Self-pay | Admitting: Radiation Therapy

## 2021-06-14 ENCOUNTER — Telehealth: Payer: Self-pay | Admitting: Internal Medicine

## 2021-06-14 NOTE — Telephone Encounter (Signed)
Scheduled appt per 7/5 sch msg. Pt aware.  

## 2021-06-30 ENCOUNTER — Other Ambulatory Visit: Payer: Self-pay

## 2021-06-30 ENCOUNTER — Inpatient Hospital Stay (HOSPITAL_BASED_OUTPATIENT_CLINIC_OR_DEPARTMENT_OTHER): Payer: Medicare Other | Admitting: Oncology

## 2021-06-30 ENCOUNTER — Inpatient Hospital Stay: Payer: Medicare Other | Attending: Oncology

## 2021-06-30 ENCOUNTER — Inpatient Hospital Stay: Payer: Medicare Other

## 2021-06-30 VITALS — BP 134/73 | HR 62 | Temp 99.1°F | Resp 13 | Ht 69.0 in | Wt 166.4 lb

## 2021-06-30 DIAGNOSIS — C439 Malignant melanoma of skin, unspecified: Secondary | ICD-10-CM | POA: Diagnosis not present

## 2021-06-30 DIAGNOSIS — Z5112 Encounter for antineoplastic immunotherapy: Secondary | ICD-10-CM | POA: Diagnosis not present

## 2021-06-30 DIAGNOSIS — C7931 Secondary malignant neoplasm of brain: Secondary | ICD-10-CM | POA: Diagnosis not present

## 2021-06-30 DIAGNOSIS — C7932 Secondary malignant neoplasm of cerebral meninges: Secondary | ICD-10-CM | POA: Insufficient documentation

## 2021-06-30 DIAGNOSIS — K769 Liver disease, unspecified: Secondary | ICD-10-CM | POA: Insufficient documentation

## 2021-06-30 DIAGNOSIS — Z79899 Other long term (current) drug therapy: Secondary | ICD-10-CM | POA: Diagnosis not present

## 2021-06-30 DIAGNOSIS — R160 Hepatomegaly, not elsewhere classified: Secondary | ICD-10-CM

## 2021-06-30 DIAGNOSIS — C4359 Malignant melanoma of other part of trunk: Secondary | ICD-10-CM | POA: Insufficient documentation

## 2021-06-30 LAB — CBC WITH DIFFERENTIAL (CANCER CENTER ONLY)
Abs Immature Granulocytes: 0.02 10*3/uL (ref 0.00–0.07)
Basophils Absolute: 0 10*3/uL (ref 0.0–0.1)
Basophils Relative: 1 %
Eosinophils Absolute: 0.3 10*3/uL (ref 0.0–0.5)
Eosinophils Relative: 4 %
HCT: 43.1 % (ref 39.0–52.0)
Hemoglobin: 14.5 g/dL (ref 13.0–17.0)
Immature Granulocytes: 0 %
Lymphocytes Relative: 24 %
Lymphs Abs: 1.6 10*3/uL (ref 0.7–4.0)
MCH: 28.5 pg (ref 26.0–34.0)
MCHC: 33.6 g/dL (ref 30.0–36.0)
MCV: 84.7 fL (ref 80.0–100.0)
Monocytes Absolute: 0.5 10*3/uL (ref 0.1–1.0)
Monocytes Relative: 7 %
Neutro Abs: 4.5 10*3/uL (ref 1.7–7.7)
Neutrophils Relative %: 64 %
Platelet Count: 197 10*3/uL (ref 150–400)
RBC: 5.09 MIL/uL (ref 4.22–5.81)
RDW: 13.2 % (ref 11.5–15.5)
WBC Count: 6.9 10*3/uL (ref 4.0–10.5)
nRBC: 0 % (ref 0.0–0.2)

## 2021-06-30 LAB — CMP (CANCER CENTER ONLY)
ALT: 27 U/L (ref 0–44)
AST: 35 U/L (ref 15–41)
Albumin: 4.1 g/dL (ref 3.5–5.0)
Alkaline Phosphatase: 51 U/L (ref 38–126)
Anion gap: 7 (ref 5–15)
BUN: 22 mg/dL (ref 8–23)
CO2: 26 mmol/L (ref 22–32)
Calcium: 9.2 mg/dL (ref 8.9–10.3)
Chloride: 107 mmol/L (ref 98–111)
Creatinine: 1.13 mg/dL (ref 0.61–1.24)
GFR, Estimated: >60 mL/min (ref >60)
Glucose, Bld: 146 mg/dL — ABNORMAL HIGH (ref 70–99)
Potassium: 4.1 mmol/L (ref 3.5–5.1)
Sodium: 140 mmol/L (ref 135–145)
Total Bilirubin: 0.5 mg/dL (ref 0.3–1.2)
Total Protein: 6.7 g/dL (ref 6.5–8.1)

## 2021-06-30 LAB — TSH: TSH: 2.825 u[IU]/mL (ref 0.320–4.118)

## 2021-06-30 MED ORDER — SODIUM CHLORIDE 0.9 % IV SOLN
480.0000 mg | Freq: Once | INTRAVENOUS | Status: AC
  Administered 2021-06-30: 480 mg via INTRAVENOUS
  Filled 2021-06-30: qty 48

## 2021-06-30 MED ORDER — SODIUM CHLORIDE 0.9 % IV SOLN
Freq: Once | INTRAVENOUS | Status: AC
  Filled 2021-06-30: qty 250

## 2021-06-30 NOTE — Progress Notes (Signed)
Hematology and Oncology Follow Up Visit  Walter Rodriguez [id number] 1956/06/06 65 y.o. 06/30/2021 11:29 AM Walter Rodriguez, MDWhite, [name], MD   Principle Diagnosis: 65 year old man with melanoma of the upper back diagnosed in 2020.  He was found to have T4N0, BRAF mutated and subsequently developed stage IV disease in August 2021.  Prior Therapy:  He is status post local wide excision as well as right axillary sentinel lymph node sampling completed by Dr. Barry Dienes on April 01, 2019.  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 Iran Planas on Apr 10, 2019.  He is status post right craniotomy and excision of brain tumor completed on August 05, 2020 by Dr. Marcello Moores.  He status post radiation therapy following his craniotomy completed on August 25, 2020.  He received 27 Gy in 3 fractions utilizing stereotactic radiosurgery.   Current therapy: Nivolumab 480 mg every 4 weeks started on September 22, 2020.  He is here for cycle 11 of therapy.  Interim History: [name] returns today for a follow-up visit.  Since the last visit, he reports no major changes in his health.  He continues to feel well without any residual issues or complaints from treatment.  He denies any nausea, fatigue or skin rash.  He denies any diarrhea or respiratory complaints.   Medications: Unchanged on review. Current Outpatient Medications  Medication Sig Dispense Refill   Multiple Vitamin (MULTI-VITAMIN) tablet Take 1 tablet by mouth daily.     No current facility-administered medications for this visit.     Allergies: No Known Allergies      Physical Exam:     Blood pressure 134/73, pulse 62, temperature 99.1 F (37.3 C), temperature source Oral, resp. rate 13, height 5' 9"  (1.753 m), weight 166 lb 6.4 oz (75.5 kg), SpO2 98 %.    ECOG: 0     General appearance: Alert, awake without any distress. Head: Atraumatic  without abnormalities Oropharynx: Without any thrush or ulcers. Eyes: No scleral icterus. Lymph nodes: No lymphadenopathy noted in the cervical, supraclavicular, or axillary nodes Heart:regular rate and rhythm, without any murmurs or gallops.   Lung: Clear to auscultation without any rhonchi, wheezes or dullness to percussion. Abdomin: Soft, nontender without any shifting dullness or ascites. Musculoskeletal: No clubbing or cyanosis. Neurological: No motor or sensory deficits. Skin: No rashes or lesions.              Lab Results: Lab Results  Component Value Date   WBC 6.9 06/30/2021   HGB 14.5 06/30/2021   HCT 43.1 06/30/2021   MCV 84.7 06/30/2021   PLT 197 06/30/2021     Chemistry      Component Value Date/Time   NA 140 06/01/2021 0740   K 4.1 06/01/2021 0740   CL 104 06/01/2021 0740   CO2 27 06/01/2021 0740   BUN 18 06/01/2021 0740   CREATININE 1.21 06/01/2021 0740   CREATININE 1.19 07/27/2020 0836      Component Value Date/Time   CALCIUM 9.0 06/01/2021 0740   ALKPHOS 52 06/01/2021 0740   AST 20 06/01/2021 0740   ALT 21 06/01/2021 0740   BILITOT 0.6 06/01/2021 0740      IMPRESSION: No evidence of residual or recurrent disease at the site of the resected and radiated lesion in the right parietal brain.   No new cranial or intracranial lesion.   7 mm marrow focus in the left parietal bone is  favored to represent a hemangioma based on the CT appearance and stability.       65 year old with:  1.  Stage IV melanoma after presenting with  CNS involvement in August of 2021  He is currently receiving adjuvant nivolumab therapy without any major complications.  Risks and benefits of continuing this treatment were discussed at this time and the plan is to conclude therapy in August 2022.  Upon completing this therapy we will update his staging scans in September 2022.  Additional systemic therapy including BRAF targeted therapy as well as combination  immunotherapy will be reserved for salvage purposes only.   2.  CNS metastasis: MRI obtained on June 06, 2021 showed no evidence of relapsed disease.  He continues to follow with Dr. [name].  3.  Dermatology surveillance: He is up-to-date on his dermatology follow-up.  I recommended continuing at this time.  4.  Immune mediated complications.  No immune mediated complications noted at this time.  Pneumonitis, colitis and thyroid disease were reiterated and educated the patient.  5.  Antiemetics: No nausea or vomiting reported at this time.  Compazine is available to him.   6.  Hepatic lesion: We will continue to monitor on future scans.  MRI did not show evidence of malignancy.   7  Follow-up: In 1 month for the next cycle of therapy.   30  minutes were spent on this visit.  The time was dedicated to reviewing MRI images, disease status update, future plan of care discussion and answering questions regarding prognosis and management.      Zola Button, MD 7/22/202211:29 AM

## 2021-07-28 ENCOUNTER — Other Ambulatory Visit: Payer: Self-pay

## 2021-07-28 ENCOUNTER — Inpatient Hospital Stay: Payer: Medicare Other | Attending: Oncology

## 2021-07-28 ENCOUNTER — Inpatient Hospital Stay: Payer: Medicare Other

## 2021-07-28 ENCOUNTER — Inpatient Hospital Stay (HOSPITAL_BASED_OUTPATIENT_CLINIC_OR_DEPARTMENT_OTHER): Payer: Medicare Other | Admitting: Oncology

## 2021-07-28 VITALS — BP 133/75 | HR 54 | Temp 98.1°F | Resp 19 | Ht 69.0 in | Wt 164.0 lb

## 2021-07-28 DIAGNOSIS — Z5112 Encounter for antineoplastic immunotherapy: Secondary | ICD-10-CM | POA: Diagnosis not present

## 2021-07-28 DIAGNOSIS — Z79899 Other long term (current) drug therapy: Secondary | ICD-10-CM | POA: Diagnosis not present

## 2021-07-28 DIAGNOSIS — C7931 Secondary malignant neoplasm of brain: Secondary | ICD-10-CM | POA: Insufficient documentation

## 2021-07-28 DIAGNOSIS — C439 Malignant melanoma of skin, unspecified: Secondary | ICD-10-CM | POA: Diagnosis not present

## 2021-07-28 LAB — CMP (CANCER CENTER ONLY)
ALT: 20 U/L (ref 0–44)
AST: 20 U/L (ref 15–41)
Albumin: 4.1 g/dL (ref 3.5–5.0)
Alkaline Phosphatase: 54 U/L (ref 38–126)
Anion gap: 10 (ref 5–15)
BUN: 19 mg/dL (ref 8–23)
CO2: 27 mmol/L (ref 22–32)
Calcium: 9.4 mg/dL (ref 8.9–10.3)
Chloride: 105 mmol/L (ref 98–111)
Creatinine: 1.14 mg/dL (ref 0.61–1.24)
GFR, Estimated: >60 mL/min (ref >60)
Glucose, Bld: 85 mg/dL (ref 70–99)
Potassium: 4.2 mmol/L (ref 3.5–5.1)
Sodium: 142 mmol/L (ref 135–145)
Total Bilirubin: 0.5 mg/dL (ref 0.3–1.2)
Total Protein: 6.7 g/dL (ref 6.5–8.1)

## 2021-07-28 LAB — CBC WITH DIFFERENTIAL (CANCER CENTER ONLY)
Abs Immature Granulocytes: 0.02 10*3/uL (ref 0.00–0.07)
Basophils Absolute: 0.1 10*3/uL (ref 0.0–0.1)
Basophils Relative: 1 %
Eosinophils Absolute: 0.2 10*3/uL (ref 0.0–0.5)
Eosinophils Relative: 3 %
HCT: 44.2 % (ref 39.0–52.0)
Hemoglobin: 14.7 g/dL (ref 13.0–17.0)
Immature Granulocytes: 0 %
Lymphocytes Relative: 22 %
Lymphs Abs: 1.7 10*3/uL (ref 0.7–4.0)
MCH: 28.2 pg (ref 26.0–34.0)
MCHC: 33.3 g/dL (ref 30.0–36.0)
MCV: 84.7 fL (ref 80.0–100.0)
Monocytes Absolute: 0.4 10*3/uL (ref 0.1–1.0)
Monocytes Relative: 6 %
Neutro Abs: 5.3 10*3/uL (ref 1.7–7.7)
Neutrophils Relative %: 68 %
Platelet Count: 201 10*3/uL (ref 150–400)
RBC: 5.22 MIL/uL (ref 4.22–5.81)
RDW: 13.1 % (ref 11.5–15.5)
WBC Count: 7.6 10*3/uL (ref 4.0–10.5)
nRBC: 0 % (ref 0.0–0.2)

## 2021-07-28 LAB — TSH: TSH: 2.35 u[IU]/mL (ref 0.320–4.118)

## 2021-07-28 MED ORDER — SODIUM CHLORIDE 0.9 % IV SOLN: Freq: Once | INTRAVENOUS | Status: AC

## 2021-07-28 MED ORDER — SODIUM CHLORIDE 0.9 % IV SOLN
480.0000 mg | Freq: Once | INTRAVENOUS | Status: AC
  Administered 2021-07-28: 480 mg via INTRAVENOUS
  Filled 2021-07-28: qty 48

## 2021-07-28 NOTE — Progress Notes (Signed)
Hematology and Oncology Follow Up Visit  Walter Rodriguez [id number] 01/14/1956 65 y.o. 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  He was initially diagnosed in 2020 with T4N0, BRAF mutated tumor.  Prior Therapy:  He is status post local wide excision as well as right axillary sentinel lymph node sampling completed by Dr. Byerly on April 01, 2019.  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 Thimmappa on Apr 10, 2019.  He is status post right craniotomy and excision of brain tumor completed on August 05, 2020 by Dr. Thomas.  He status post radiation therapy following his craniotomy completed on August 25, 2020.  He received 27 Gy in 3 fractions utilizing stereotactic radiosurgery.   Current therapy: Nivolumab 480 mg every 4 weeks started on September 22, 2020.  He is here for cycle 12 of therapy.  Interim History: Mr. Walter Rodriguez is here for a follow-up evaluation.  Since last visit, he reports feeling well without any major complaints.  He denies any nausea, vomiting or abdominal pain.  He denies any skin rashes or lesions.  Continues to follow with dermatology and had a lesion frozen recently.  He denies any hospitalization or illnesses.   Medications: Unchanged on review. Current Outpatient Medications  Medication Sig Dispense Refill   Multiple Vitamin (MULTI-VITAMIN) tablet Take 1 tablet by mouth daily.     No current facility-administered medications for this visit.     Allergies: No Known Allergies      Physical Exam:      Blood pressure 133/75, pulse (!) 54, temperature 98.1 F (36.7 C), temperature source Oral, resp. rate 19, height 5' 9" (1.753 m), weight 164 lb (74.4 kg), SpO2 99 %.    ECOG: 0     General appearance: Comfortable appearing  without any discomfort Head: Normocephalic without any trauma Oropharynx: Mucous membranes are moist and pink without any thrush or ulcers. Eyes: Pupils are equal and round reactive to light. Lymph nodes: No cervical, supraclavicular, inguinal or axillary lymphadenopathy.   Heart:regular rate and rhythm.  S1 and S2 without leg edema. Lung: Clear without any rhonchi or wheezes.  No dullness to percussion. Abdomin: Soft, nontender, nondistended with good bowel sounds.  No hepatosplenomegaly. Musculoskeletal: No joint deformity or effusion.  Full range of motion noted. Neurological: No deficits noted on motor, sensory and deep tendon reflex exam. Skin: No petechial rash or dryness.  Appeared moist.                Lab Results: Lab Results  Component Value Date   WBC 6.9 06/30/2021   HGB 14.5 06/30/2021   HCT 43.1 06/30/2021   MCV 84.7 06/30/2021   PLT 197 06/30/2021     Chemistry      Component Value Date/Time   NA 140 06/30/2021 1105   K 4.1 06/30/2021 1105   CL 107 06/30/2021 1105   CO2 26 06/30/2021 1105   BUN 22 06/30/2021 1105   CREATININE 1.13 06/30/2021 1105   CREATININE 1.19 07/27/2020 0836      Component Value Date/Time   CALCIUM 9.2 06/30/2021 1105   ALKPHOS 51 06/30/2021 1105   AST 35 06/30/2021 1105   ALT 27 06/30/2021 1105   BILITOT 0.5 06/30/2021 1105           65-year-old   with:  1.  Cutaneous melanoma of the upper back diagnosed in 2020.  He developed stage IV disease with CNS involvement.  He has tolerated nivolumab without any major complaints and will complete adjuvant therapy after infusion today.  Management choices moving forward were discussed and I recommended continued active surveillance.  We will repeat his imaging studies in September 2022.  Different salvage therapy options including ipilimumab and nivolumab versus BRAF targeted therapy.  He is agreeable to proceed with this plan.   2.  CNS metastasis: No evidence of progression  of disease at this time.  Last MRI was done on June 06, 2021.  3.  Dermatology surveillance: I continue to encourage him to follow-up with dermatology which she is doing a regular basis.  4.  Immune mediated complications.  I continue to educate him about potential complications to immunotherapy including pneumonitis, colitis and thyroid disease.  5.  Antiemetics: Compazine is available to him without any nausea or vomiting.   6.  Follow-up: He will return in the next 4 to 6 weeks for repeat evaluation and imaging studies.   30  minutes were dedicated to this encounter.  The time was spent on reviewing laboratory data, disease status update, treatment choices and future plan of care discussion.      Firas Shadad, MD 8/19/20229:50 AM 

## 2021-07-28 NOTE — Patient Instructions (Signed)
[facility] MEDICAL ONCOLOGY   ?Discharge Instructions: ?Thank you for choosing [facility] to provide your oncology and hematology care.  ? ?If you have a lab appointment with the Cancer Center, please go directly to the Cancer Center and check in at the registration area. ?  ?Wear comfortable clothing and clothing appropriate for easy access to any Portacath or PICC line.  ? ?We strive to give you quality time with your provider. You may need to reschedule your appointment if you arrive late (15 or more minutes).  Arriving late affects you and other patients whose appointments are after yours.  Also, if you miss three or more appointments without notifying the office, you may be dismissed from the clinic at the provider?s discretion.    ?  ?For prescription refill requests, have your pharmacy contact our office and allow 72 hours for refills to be completed.   ? ?Today you received the following chemotherapy and/or immunotherapy agents: Nivolumab (Opdivo)    ?  ?To help prevent nausea and vomiting after your treatment, we encourage you to take your nausea medication as directed. ? ?BELOW ARE SYMPTOMS THAT SHOULD BE REPORTED IMMEDIATELY: ?*FEVER GREATER THAN 100.4 F (38 ?C) OR HIGHER ?*CHILLS OR SWEATING ?*NAUSEA AND VOMITING THAT IS NOT CONTROLLED WITH YOUR NAUSEA MEDICATION ?*UNUSUAL SHORTNESS OF BREATH ?*UNUSUAL BRUISING OR BLEEDING ?*URINARY PROBLEMS (pain or burning when urinating, or frequent urination) ?*BOWEL PROBLEMS (unusual diarrhea, constipation, pain near the anus) ?TENDERNESS IN MOUTH AND THROAT WITH OR WITHOUT PRESENCE OF ULCERS (sore throat, sores in mouth, or a toothache) ?UNUSUAL RASH, SWELLING OR PAIN  ?UNUSUAL VAGINAL DISCHARGE OR ITCHING  ? ?Items with * indicate a potential emergency and should be followed up as soon as possible or go to the Emergency Department if any problems should occur. ? ?Please show the CHEMOTHERAPY ALERT CARD or IMMUNOTHERAPY ALERT CARD at  check-in to the Emergency Department and triage nurse. ? ?Should you have questions after your visit or need to cancel or reschedule your appointment, please contact [facility]COL[facility]  Dept: [phone]  and follow the prompts.  Office hours are 8:00 a.m. to 4:30 p.m. Monday - Friday. Please note that voicemails left after 4:00 p.m. may not be returned until the following business day.  We are closed weekends and major holidays. You have access to a nurse at all times for urgent questions. Please call the main number to the clinic Dept: [phone] and follow the prompts. ? ? ?For any non-urgent questions, you may also contact your provider using MyChart. We now offer e-Visits for anyone 18 and older to request care online for non-urgent symptoms. For details visit [email or web address]. ?  ?Also download the MyChart app! Go to the app store, search "MyChart", open the app, select , and log in with your MyChart username and password. ? ?Due to Covid, a mask is required upon entering the hospital/clinic. If you do not have a mask, one will be given to you upon arrival. For doctor visits, patients may have 1 support person aged 18 or older with them. For treatment visits, patients cannot have anyone with them due to current Covid guidelines and our immunocompromised population.  ? ?

## 2021-08-08 ENCOUNTER — Telehealth: Payer: Self-pay | Admitting: Oncology

## 2021-08-08 NOTE — Telephone Encounter (Signed)
Scheduled per 08/19 los, patient has been called and notified of upcoming appointments.

## 2021-08-25 ENCOUNTER — Other Ambulatory Visit: Payer: Self-pay

## 2021-08-25 ENCOUNTER — Encounter (HOSPITAL_COMMUNITY): Payer: Self-pay

## 2021-08-25 ENCOUNTER — Inpatient Hospital Stay: Payer: Medicare Other | Attending: Oncology

## 2021-08-25 ENCOUNTER — Ambulatory Visit (HOSPITAL_COMMUNITY)
Admission: RE | Admit: 2021-08-25 | Discharge: 2021-08-25 | Disposition: A | Discharge status: Home / Self Care | Payer: Medicare Other | Source: Ambulatory Visit | Attending: Oncology | Admitting: Oncology

## 2021-08-25 DIAGNOSIS — K769 Liver disease, unspecified: Secondary | ICD-10-CM | POA: Insufficient documentation

## 2021-08-25 DIAGNOSIS — C7932 Secondary malignant neoplasm of cerebral meninges: Secondary | ICD-10-CM | POA: Insufficient documentation

## 2021-08-25 DIAGNOSIS — C4359 Malignant melanoma of other part of trunk: Secondary | ICD-10-CM | POA: Diagnosis not present

## 2021-08-25 DIAGNOSIS — Z85820 Personal history of malignant melanoma of skin: Secondary | ICD-10-CM | POA: Diagnosis not present

## 2021-08-25 DIAGNOSIS — N281 Cyst of kidney, acquired: Secondary | ICD-10-CM | POA: Insufficient documentation

## 2021-08-25 DIAGNOSIS — Z923 Personal history of irradiation: Secondary | ICD-10-CM | POA: Diagnosis not present

## 2021-08-25 DIAGNOSIS — C439 Malignant melanoma of skin, unspecified: Secondary | ICD-10-CM | POA: Insufficient documentation

## 2021-08-25 DIAGNOSIS — I700 Atherosclerosis of aorta: Secondary | ICD-10-CM | POA: Insufficient documentation

## 2021-08-25 DIAGNOSIS — M47816 Spondylosis without myelopathy or radiculopathy, lumbar region: Secondary | ICD-10-CM | POA: Diagnosis not present

## 2021-08-25 DIAGNOSIS — Z79899 Other long term (current) drug therapy: Secondary | ICD-10-CM | POA: Diagnosis not present

## 2021-08-25 LAB — CMP (CANCER CENTER ONLY)
ALT: 18 U/L (ref 0–44)
AST: 21 U/L (ref 15–41)
Albumin: 4.2 g/dL (ref 3.5–5.0)
Alkaline Phosphatase: 60 U/L (ref 38–126)
Anion gap: 10 (ref 5–15)
BUN: 19 mg/dL (ref 8–23)
CO2: 26 mmol/L (ref 22–32)
Calcium: 9.6 mg/dL (ref 8.9–10.3)
Chloride: 105 mmol/L (ref 98–111)
Creatinine: 1.09 mg/dL (ref 0.61–1.24)
GFR, Estimated: >60 mL/min (ref >60)
Glucose, Bld: 92 mg/dL (ref 70–99)
Potassium: 4.3 mmol/L (ref 3.5–5.1)
Sodium: 141 mmol/L (ref 135–145)
Total Bilirubin: 0.6 mg/dL (ref 0.3–1.2)
Total Protein: 7 g/dL (ref 6.5–8.1)

## 2021-08-25 LAB — CBC WITH DIFFERENTIAL (CANCER CENTER ONLY)
Abs Immature Granulocytes: 0.01 10*3/uL (ref 0.00–0.07)
Basophils Absolute: 0.1 10*3/uL (ref 0.0–0.1)
Basophils Relative: 1 %
Eosinophils Absolute: 0.2 10*3/uL (ref 0.0–0.5)
Eosinophils Relative: 3 %
HCT: 45.4 % (ref 39.0–52.0)
Hemoglobin: 14.7 g/dL (ref 13.0–17.0)
Immature Granulocytes: 0 %
Lymphocytes Relative: 23 %
Lymphs Abs: 1.7 10*3/uL (ref 0.7–4.0)
MCH: 27.5 pg (ref 26.0–34.0)
MCHC: 32.4 g/dL (ref 30.0–36.0)
MCV: 85 fL (ref 80.0–100.0)
Monocytes Absolute: 0.4 10*3/uL (ref 0.1–1.0)
Monocytes Relative: 5 %
Neutro Abs: 5.2 10*3/uL (ref 1.7–7.7)
Neutrophils Relative %: 68 %
Platelet Count: 201 10*3/uL (ref 150–400)
RBC: 5.34 MIL/uL (ref 4.22–5.81)
RDW: 13.2 % (ref 11.5–15.5)
WBC Count: 7.6 10*3/uL (ref 4.0–10.5)
nRBC: 0 % (ref 0.0–0.2)

## 2021-08-25 LAB — TSH: TSH: 2.29 u[IU]/mL (ref 0.320–4.118)

## 2021-08-25 IMAGING — CT CT CHEST-ABD-PELV W/ CM
2 of 5 series · 13 of 36 positions shown, 15 images · IV contrast (APPLIED)
Comparison: CT scan [DATE] and MRI abdomen [DATE]

CLINICAL DATA: History of metastatic melanoma.

EXAM:
CT CHEST, ABDOMEN, AND PELVIS WITH CONTRAST
TECHNIQUE: Multidetector CT imaging of the chest, abdomen and pelvis was
performed following the standard protocol during bolus
administration of intravenous contrast.
CONTRAST:  80mL OMNIPAQUE IOHEXOL 350 MG/ML SOLN

[Series 2: cap with · axial · 0.95mm/px · z∈[-279,+276]mm · 10 of 137 slices shown, 12 images]
[im 13/137  mediastinal]
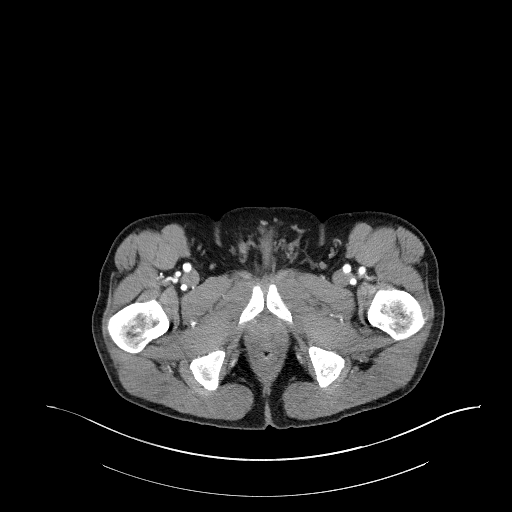
[im 13/137  bone]
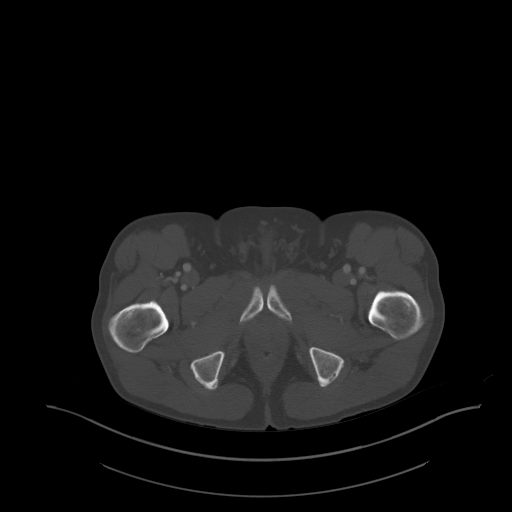
[im 25/137  mediastinal]
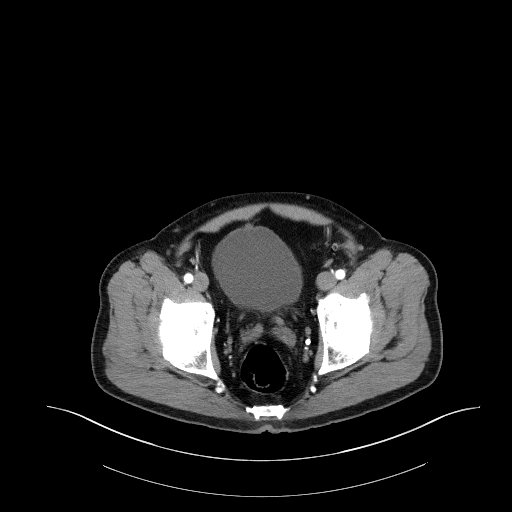
[im 38/137  mediastinal]
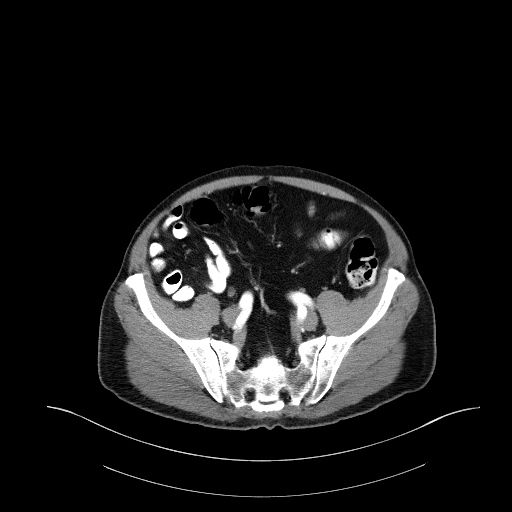
[im 50/137  mediastinal]
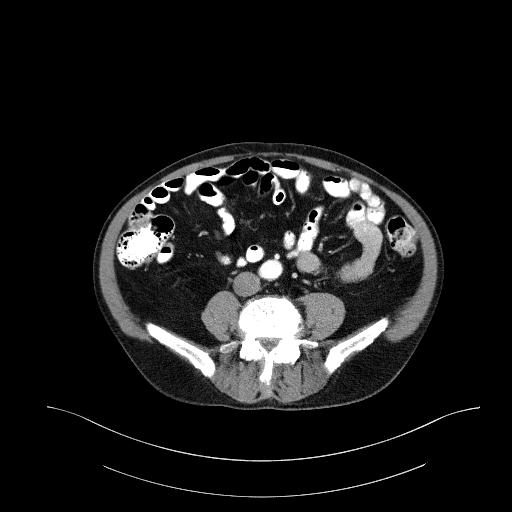
[im 62/137  mediastinal]
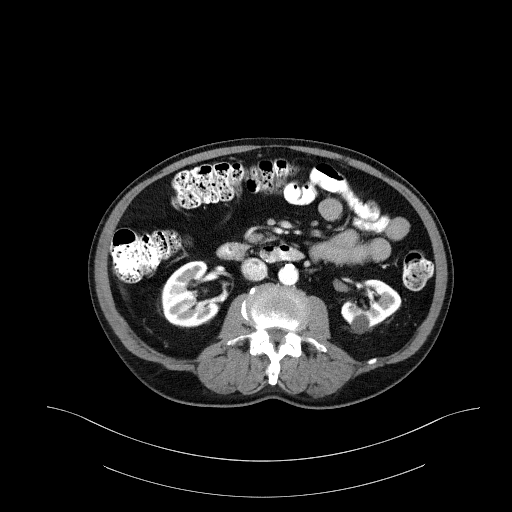
[im 75/137  mediastinal]
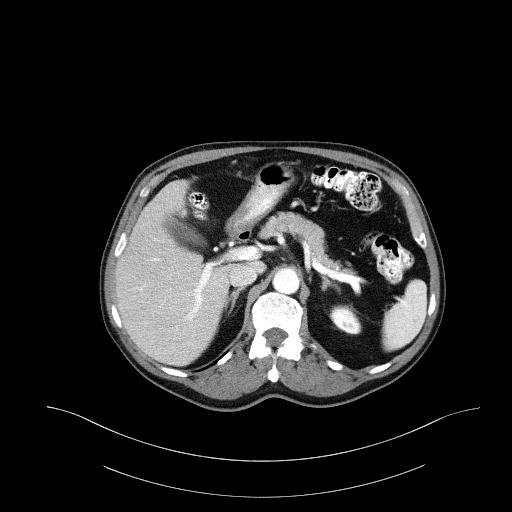
[im 87/137  mediastinal]
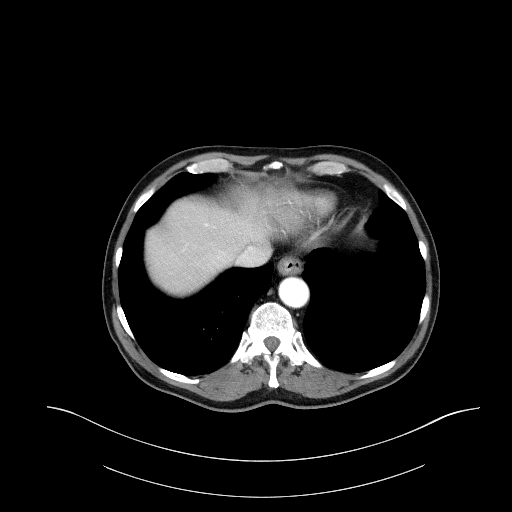
[im 99/137  mediastinal]
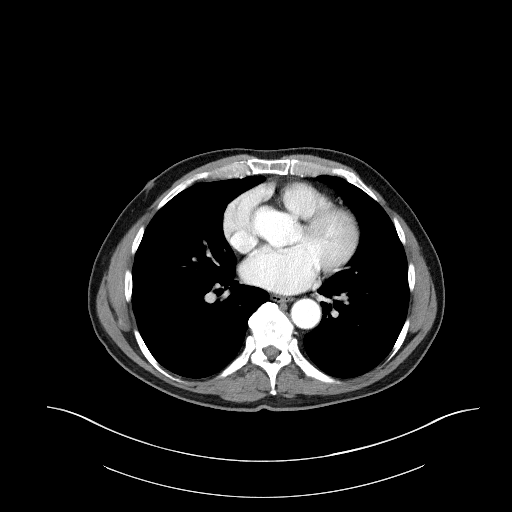
[im 112/137  mediastinal]
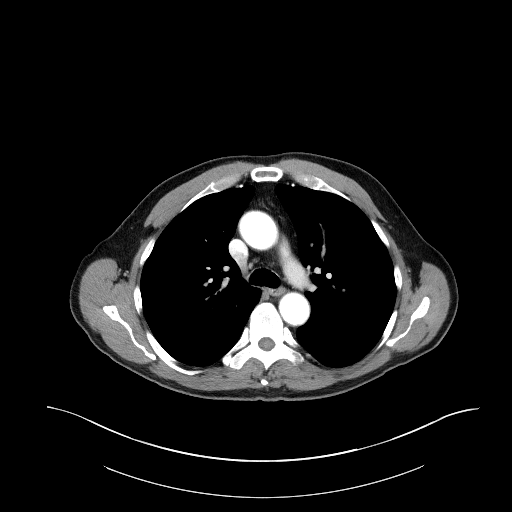
[im 112/137  bone]
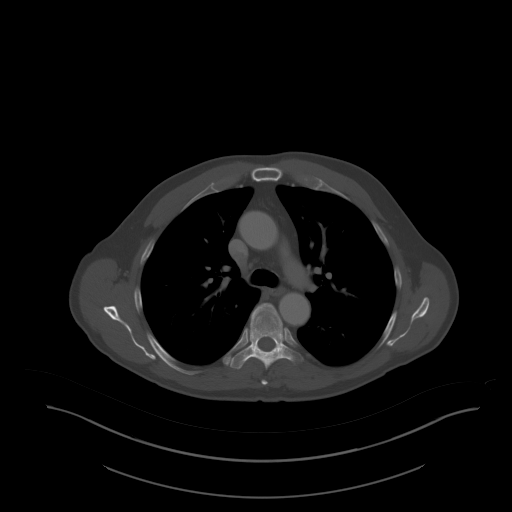
[im 124/137  mediastinal]
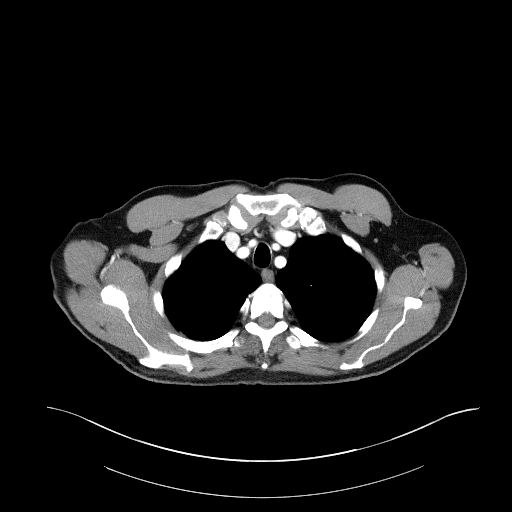

[Series 5: coronals · coronal · 0.93mm/px · 3 of 157 slices shown]
[im 32/157  mediastinal]
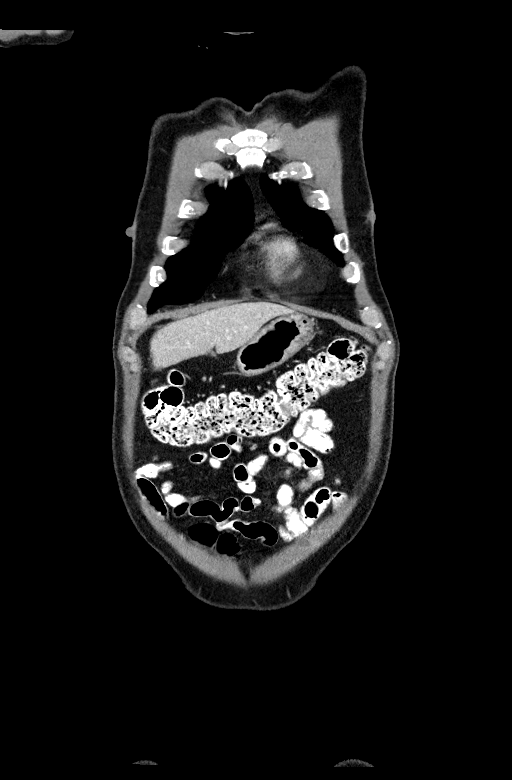
[im 63/157  mediastinal]
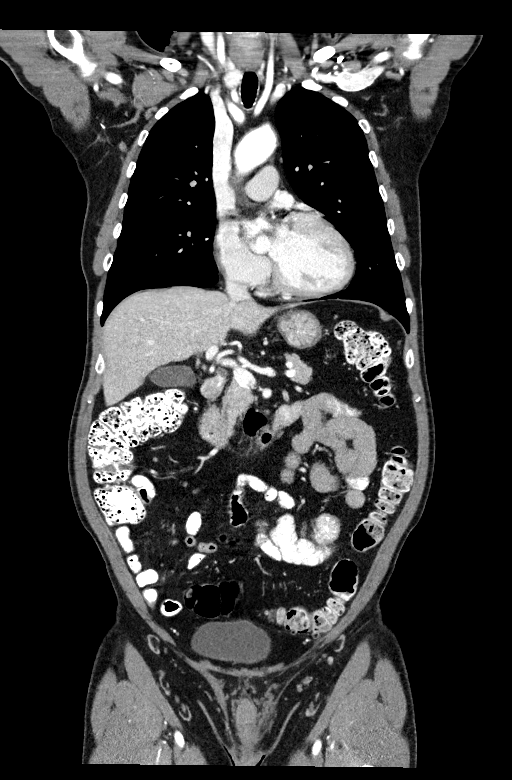
[im 94/157  mediastinal]
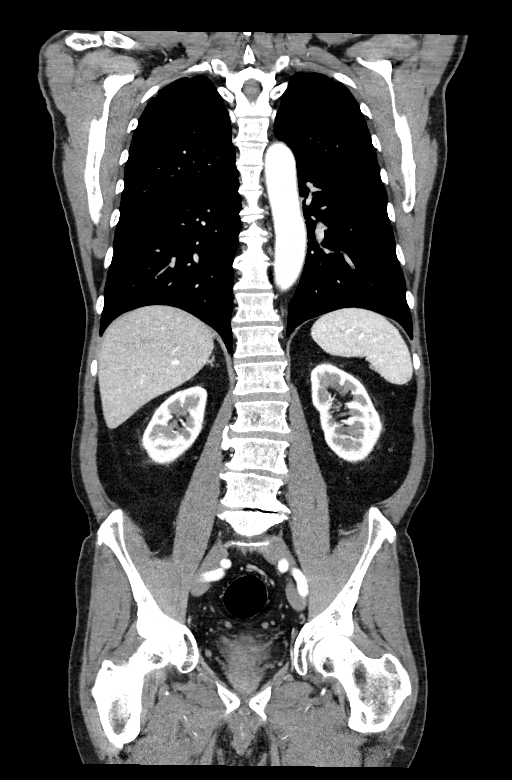

[13 of 36 positions shown; findings below may reference images not displayed]

FINDINGS: CT CHEST FINDINGS

Cardiovascular: The heart is normal in size. No pericardial
effusion. The aorta is normal in caliber. No dissection or
atherosclerotic calcifications. The branch vessels are patent. No
definite coronary artery calcifications.

Mediastinum/Nodes: No mediastinal or hilar mass or adenopathy. The
esophagus is unremarkable.

Lungs/Pleura: No acute pulmonary findings. No worrisome pulmonary
lesions or pulmonary nodules to suggest metastatic disease. No
pleural effusions or pleural nodules.

Musculoskeletal: No chest wall mass, supraclavicular or axillary
adenopathy. Stable surgical clips noted in the right axilla. Stable
11 mm right thyroid nodule since a neck CT from [DATE] Not
clinically significant; no follow-up imaging recommended (ref: [HOSPITAL]. [DATE]): 143-50).No significant bony findings.
No lytic or sclerotic bone lesions.

CT ABDOMEN PELVIS FINDINGS

Hepatobiliary: Stable 10 mm peripheral segment 7 lesion on image
57/2. This is likely a benign lesion based on prior MRI. Continued
attention on follow-up studies is suggested. No other hepatic
lesions. No intrahepatic biliary dilatation. The gallbladder is
unremarkable. No common bile duct dilatation.

Pancreas: No mass, inflammation or ductal dilatation.

Spleen: Normal size. No focal lesions.

Adrenals/Urinary Tract: Adrenal glands are normal.

Stable bilateral renal cysts. No worrisome renal lesions or
hydronephrosis. The bladder is unremarkable.

Stomach/Bowel: The stomach, duodenum, small bowel and colon are
unremarkable. No acute inflammatory changes, mass lesions or
obstructive findings. The terminal ileum is normal. The appendix is
normal.

Vascular/Lymphatic: Scattered aortic calcifications. No aneurysm or
dissection. The branch vessels are patent. No mesenteric or
retroperitoneal mass or adenopathy. Small scattered lymph nodes are
stable.

Reproductive: The prostate gland and seminal vesicles are
unremarkable.

Other: No pelvic mass or adenopathy. No free pelvic fluid
collections. No inguinal mass or adenopathy. No abdominal wall
hernia or subcutaneous lesions.

Musculoskeletal: No significant bony findings. No lytic or sclerotic
bone lesions. Stable degenerative changes in the lumbar spine.
IMPRESSION: 1. Stable 10 mm peripheral segment 7 liver lesion, likely a benign
lesion based on prior MRI. Continued attention on follow-up studies
is suggested.
2. No findings for metastatic disease involving the chest, abdomen
or pelvis.
3. Stable bilateral renal cysts.
4. Aortic atherosclerosis.

Aortic Atherosclerosis ([9N]-[9N]).

## 2021-08-25 MED ORDER — IOHEXOL 350 MG/ML SOLN
80.0000 mL | Freq: Once | INTRAVENOUS | Status: AC | PRN
  Administered 2021-08-25: 80 mL via INTRAVENOUS

## 2021-09-01 ENCOUNTER — Inpatient Hospital Stay (HOSPITAL_BASED_OUTPATIENT_CLINIC_OR_DEPARTMENT_OTHER): Payer: Medicare Other | Admitting: Oncology

## 2021-09-01 ENCOUNTER — Other Ambulatory Visit: Payer: Self-pay

## 2021-09-01 VITALS — BP 134/77 | HR 57 | Temp 99.0°F | Resp 18 | Ht 69.0 in | Wt 164.2 lb

## 2021-09-01 DIAGNOSIS — K769 Liver disease, unspecified: Secondary | ICD-10-CM | POA: Diagnosis not present

## 2021-09-01 DIAGNOSIS — Z923 Personal history of irradiation: Secondary | ICD-10-CM | POA: Diagnosis not present

## 2021-09-01 DIAGNOSIS — C439 Malignant melanoma of skin, unspecified: Secondary | ICD-10-CM | POA: Diagnosis not present

## 2021-09-01 DIAGNOSIS — N281 Cyst of kidney, acquired: Secondary | ICD-10-CM | POA: Diagnosis not present

## 2021-09-01 DIAGNOSIS — C7932 Secondary malignant neoplasm of cerebral meninges: Secondary | ICD-10-CM | POA: Diagnosis not present

## 2021-09-01 DIAGNOSIS — Z79899 Other long term (current) drug therapy: Secondary | ICD-10-CM | POA: Diagnosis not present

## 2021-09-01 DIAGNOSIS — I700 Atherosclerosis of aorta: Secondary | ICD-10-CM | POA: Diagnosis not present

## 2021-09-01 DIAGNOSIS — C4359 Malignant melanoma of other part of trunk: Secondary | ICD-10-CM | POA: Diagnosis not present

## 2021-09-01 NOTE — Progress Notes (Signed)
Hematology and Oncology Follow Up Visit  [name] [id number] 1956/08/05 65 y.o. 09/01/2021 3:09 PM [name], MDWhite, [name], MD   Principle Diagnosis: 65 year old man with T4N0 melanoma of the upper back diagnosed in 2020.  He developed stage IV with CNS metastasis noted in August 2021.    Prior Therapy:  He is status post local wide excision as well as right axillary sentinel lymph node sampling completed by Dr. Barry Dienes on April 01, 2019.  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 Iran Planas on Apr 10, 2019.  He is status post right craniotomy and excision of brain tumor completed on August 05, 2020 by Dr. Marcello Moores.  He status post radiation therapy following his craniotomy completed on August 25, 2020.  He received 27 Gy in 3 fractions utilizing stereotactic radiosurgery.  Nivolumab 480 mg every 4 weeks started on September 22, 2020.  He completed 12 cycles of therapy in August 2022.   Current therapy:   Interim History: Mr. Walter Rodriguez returns today for repeat evaluation.  Since her last visit, he reports feeling well without any major complaints.  He denies any residual complications related to nivolumab.  He denies any nausea, vomiting or abdominal pain.  He denies any skin rashes or lesions.   Medications: Updated on review. Current Outpatient Medications  Medication Sig Dispense Refill   Multiple Vitamin (MULTI-VITAMIN) tablet Take 1 tablet by mouth daily.     No current facility-administered medications for this visit.     Allergies: No Known Allergies      Physical Exam:       Blood pressure 134/77, pulse (!) 57, temperature 99 F (37.2 C), temperature source Tympanic, resp. rate 18, height _0  (1.753 m), weight 164 lb 3.2 oz (74.5 kg), SpO2 100 %.    ECOG: 0    General appearance: Alert, awake without any distress. Head: Atraumatic without  abnormalities Oropharynx: Without any thrush or ulcers. Eyes: No scleral icterus. Lymph nodes: No lymphadenopathy noted in the cervical, supraclavicular, or axillary nodes Heart:regular rate and rhythm, without any murmurs or gallops.   Lung: Clear to auscultation without any rhonchi, wheezes or dullness to percussion. Abdomin: Soft, nontender without any shifting dullness or ascites. Musculoskeletal: No clubbing or cyanosis. Neurological: No motor or sensory deficits. Skin: No rashes or lesions.                Lab Results: Lab Results  Component Value Date   WBC 7.6 08/25/2021   HGB 14.7 08/25/2021   HCT 45.4 08/25/2021   MCV 85.0 08/25/2021   PLT 201 08/25/2021     Chemistry      Component Value Date/Time   NA 141 08/25/2021 0953   K 4.3 08/25/2021 0953   CL 105 08/25/2021 0953   CO2 26 08/25/2021 0953   BUN 19 08/25/2021 0953   CREATININE 1.09 08/25/2021 0953   CREATININE 1.19 07/27/2020 0836      Component Value Date/Time   CALCIUM 9.6 08/25/2021 0953   ALKPHOS 60 08/25/2021 0953   AST 21 08/25/2021 0953   ALT 18 08/25/2021 0953   BILITOT 0.6 08/25/2021 0953        IMPRESSION: 1. Stable 10 mm peripheral segment 7 liver lesion, likely a benign lesion based on prior MRI. Continued attention on follow-up studies is suggested. 2. No findings for metastatic disease involving the chest, abdomen or pelvis. 3. Stable bilateral renal cysts.  4. Aortic atherosclerosis.   Aortic Atherosclerosis (ICD10-I70.0).     65 year old with:  1.  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  He is currently on active surveillance after completing 1 year of adjuvant immunotherapy.  I have recommended active surveillance at this time with the initiating treatment upon relapse.  Ipilimumab with nivolumab versus BRAF targeted therapy will be considered at that time.    2.  CNS metastasis: He continues to be on active  surveillance at this time.  We will have repeat MRI in the future.  3.  Dermatology surveillance: He continues to be up-to-date on dermatology surveillance which I encouraged to continue.  4.  Immune mediated complications.  He has not reported any delayed complications.  These include pneumonitis, colitis and thyroid disease.    5.  Follow-up: In 3 months for repeat evaluation and updating his staging scans.   30  minutes were dedicated to this visit.  The time was spent on reviewing his laboratory data, imaging studies, treatment choices and future plan of care review.      Zola Button, MD 9/23/20223:09 PM

## 2021-09-18 DIAGNOSIS — L578 Other skin changes due to chronic exposure to nonionizing radiation: Secondary | ICD-10-CM | POA: Diagnosis not present

## 2021-09-18 DIAGNOSIS — Z85828 Personal history of other malignant neoplasm of skin: Secondary | ICD-10-CM | POA: Diagnosis not present

## 2021-09-18 DIAGNOSIS — C7931 Secondary malignant neoplasm of brain: Secondary | ICD-10-CM | POA: Diagnosis not present

## 2021-09-18 DIAGNOSIS — Z23 Encounter for immunization: Secondary | ICD-10-CM | POA: Diagnosis not present

## 2021-09-18 DIAGNOSIS — D1801 Hemangioma of skin and subcutaneous tissue: Secondary | ICD-10-CM | POA: Diagnosis not present

## 2021-09-18 DIAGNOSIS — L570 Actinic keratosis: Secondary | ICD-10-CM | POA: Diagnosis not present

## 2021-09-18 DIAGNOSIS — D2230 Melanocytic nevi of unspecified part of face: Secondary | ICD-10-CM | POA: Diagnosis not present

## 2021-09-18 DIAGNOSIS — Z85820 Personal history of malignant melanoma of skin: Secondary | ICD-10-CM | POA: Diagnosis not present

## 2021-09-21 ENCOUNTER — Ambulatory Visit: Payer: Medicare Other | Attending: Internal Medicine

## 2021-09-21 ENCOUNTER — Other Ambulatory Visit (HOSPITAL_BASED_OUTPATIENT_CLINIC_OR_DEPARTMENT_OTHER): Payer: Self-pay

## 2021-09-21 ENCOUNTER — Encounter: Payer: Self-pay | Admitting: Oncology

## 2021-09-21 DIAGNOSIS — Z23 Encounter for immunization: Secondary | ICD-10-CM

## 2021-09-21 MED ORDER — PFIZER COVID-19 VAC BIVALENT 30 MCG/0.3ML IM SUSP
INTRAMUSCULAR | 0 refills | Status: DC
Start: 2021-09-21 — End: 2022-02-05
  Filled 2021-09-21: qty 0.3, 1d supply, fill #0

## 2021-09-21 NOTE — Progress Notes (Signed)
   Covid-19 Vaccination Clinic  Name:  Walter Rodriguez    MRN: [id number] DOB: January 18, 1956  09/21/2021  [name] was observed post Covid-19 immunization for 15 minutes without incident. He was provided with Vaccine Information Sheet and instruction to access the V-Safe system.   [name] was instructed to call 911 with any severe reactions post vaccine: Difficulty breathing  Swelling of face and throat  A fast heartbeat  A bad rash all over body  Dizziness and weakness

## 2021-10-06 ENCOUNTER — Ambulatory Visit
Admission: RE | Admit: 2021-10-06 | Discharge: 2021-10-06 | Disposition: A | Discharge status: Home / Self Care | Payer: Medicare Other | Source: Ambulatory Visit | Attending: Internal Medicine | Admitting: Internal Medicine

## 2021-10-06 ENCOUNTER — Other Ambulatory Visit: Payer: Self-pay

## 2021-10-06 DIAGNOSIS — C7931 Secondary malignant neoplasm of brain: Secondary | ICD-10-CM

## 2021-10-06 DIAGNOSIS — J3489 Other specified disorders of nose and nasal sinuses: Secondary | ICD-10-CM | POA: Diagnosis not present

## 2021-10-06 DIAGNOSIS — C719 Malignant neoplasm of brain, unspecified: Secondary | ICD-10-CM | POA: Diagnosis not present

## 2021-10-06 DIAGNOSIS — Z85820 Personal history of malignant melanoma of skin: Secondary | ICD-10-CM | POA: Diagnosis not present

## 2021-10-06 DIAGNOSIS — Z9889 Other specified postprocedural states: Secondary | ICD-10-CM | POA: Diagnosis not present

## 2021-10-06 IMAGING — MR MR HEAD WO/W CM
13 series · 48 of 48 positions shown · IV contrast (multihance)
Comparison: [DATE]

CLINICAL DATA: Brain/CNS neoplasm, assess treatment response;
metastatic melanoma

EXAM:
MRI HEAD WITHOUT AND WITH CONTRAST
TECHNIQUE: Multiplanar, multiecho pulse sequences of the brain and surrounding
structures were obtained without and with intravenous contrast.
CONTRAST:  15mL MULTIHANCE GADOBENATE DIMEGLUMINE 529 MG/ML IV SOLN

[Series 2: FLAIR · sagittal · 3.0mm · 0.75mm/px · 3 of 39 slices shown (1 of 2)]
[im 1/39]
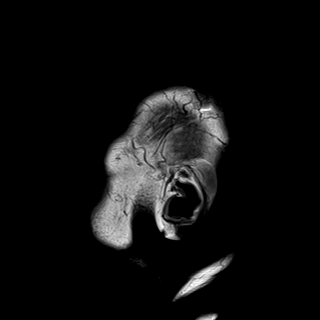
[im 20/39]
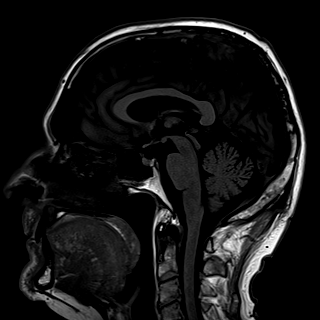
[im 39/39]
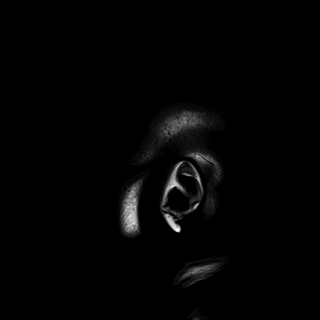

[Series 3: DWI · axial · 3.0mm · 1.50mm/px · z∈[-60,+91]mm · 4 of 80 slices shown (1 of 2)]
[im 1/80]
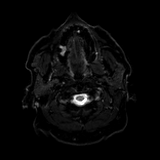
[im 27/80]
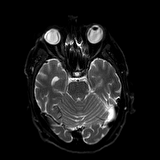
[im 53/80]
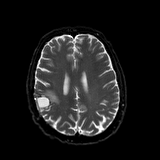
[im 80/80]
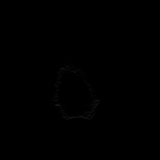

[Series 4: DWI · axial · 3.0mm · 1.50mm/px · z∈[-60,+91]mm · 2 of 37 slices shown (2 of 2)]
[im 1/37]
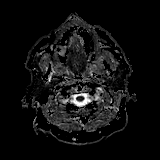
[im 37/37]
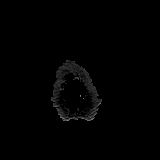

[Series 5: T2 · axial · 5.0mm · 0.57mm/px · 1 of 27 slices shown (1 of 2)]
[im 1/27]
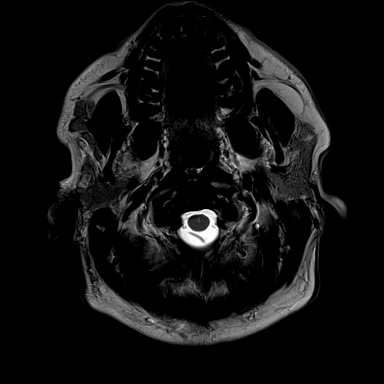

[Series 6: swi_images · axial · 1.5mm · 0.90mm/px · z∈[-67,+75]mm · 4 of 96 slices shown]
[im 1/96]
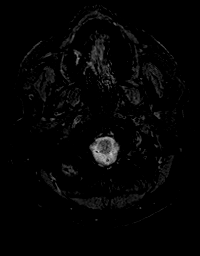
[im 32/96]
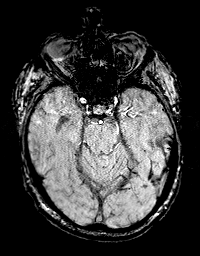
[im 64/96]
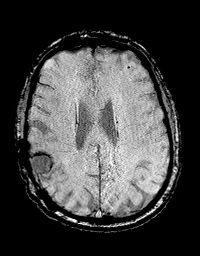
[im 96/96]
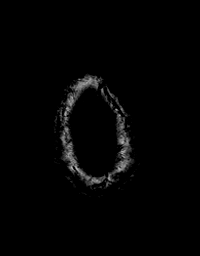

[Series 7: mip_images(sw) · axial · 12.0mm · 0.90mm/px · z∈[-62,+70]mm · 4 of 89 slices shown]
[im 1/89]
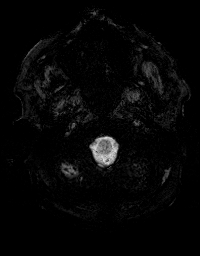
[im 30/89]
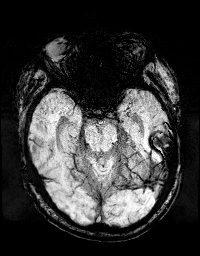
[im 59/89]
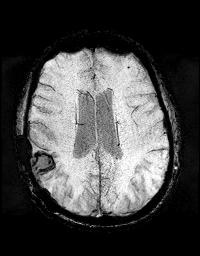
[im 89/89]
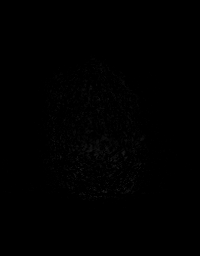

[Series 8: FLAIR · axial · 3.0mm · 0.86mm/px · z∈[-96,+81]mm · 3 of 60 slices shown (2 of 2)]
[im 1/60]
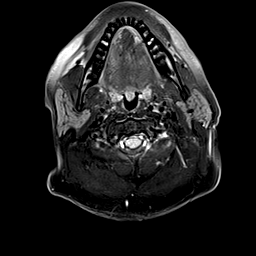
[im 30/60]
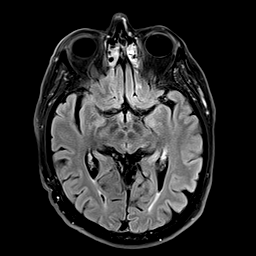
[im 60/60]
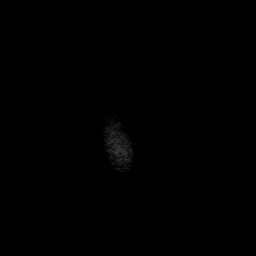

[Series 9: T2 · axial · non-contrast · 1.0mm · 0.86mm/px · z∈[-72,+84]mm · 7 of 160 slices shown (2 of 2)]
[im 1/160]
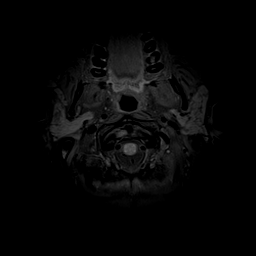
[im 27/160]
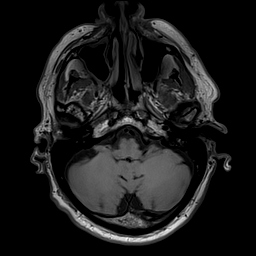
[im 54/160]
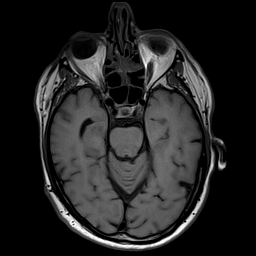
[im 80/160]
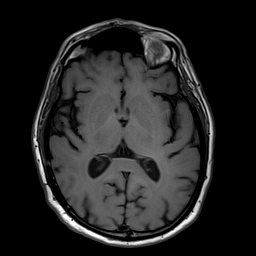
[im 107/160]
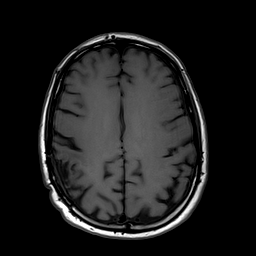
[im 133/160]
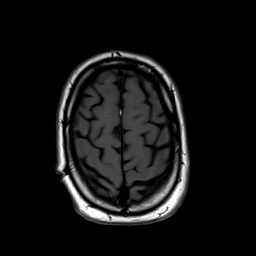
[im 160/160]
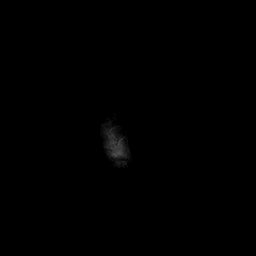

[Series 10: T2 post-contrast · coronal · 3.0mm · 0.57mm/px · 2 of 46 slices shown (1 of 2)]
[im 1/46]
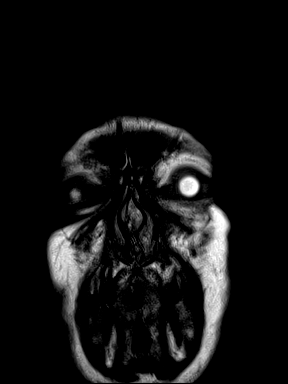
[im 46/46]
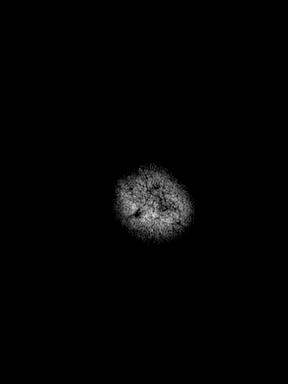

[Series 11: T2 post-contrast · axial · 1.0mm · 0.86mm/px · z∈[-72,+84]mm · 7 of 160 slices shown (2 of 2)]
[im 1/160]
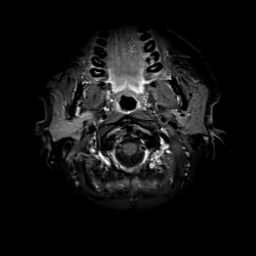
[im 27/160]
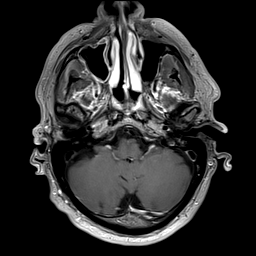
[im 54/160]
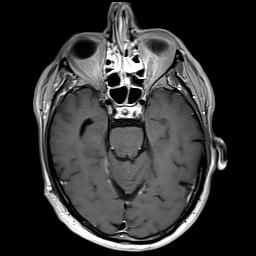
[im 80/160]
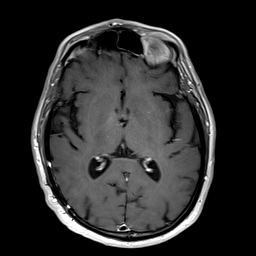
[im 107/160]
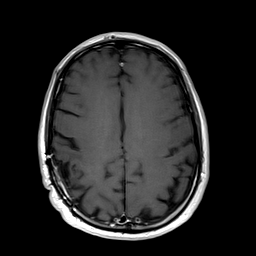
[im 133/160]
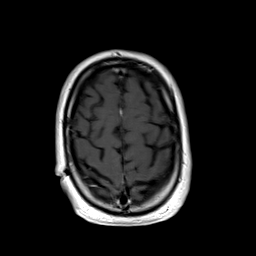
[im 160/160]
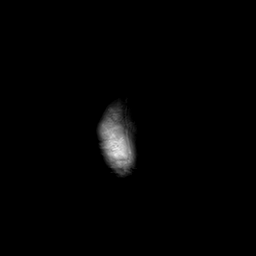

[Series 12: T1 post-contrast · axial · 1.0mm · 0.75mm/px · z∈[-74,+85]mm · 7 of 160 slices shown (1 of 2)]
[im 1/160]
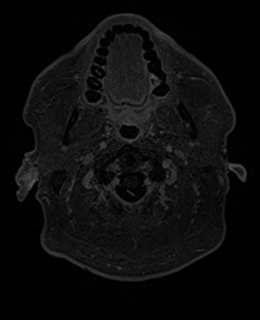
[im 27/160]
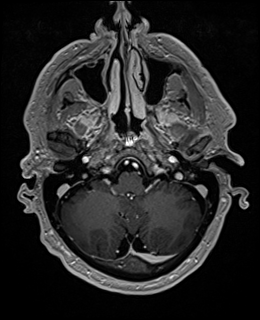
[im 54/160]
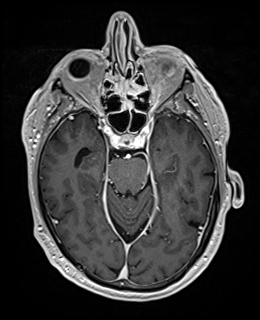
[im 80/160]
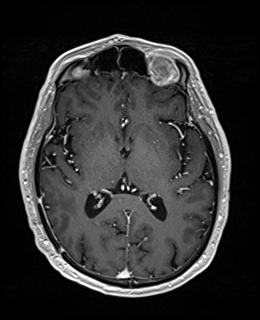
[im 107/160]
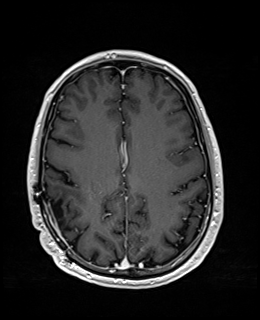
[im 133/160]
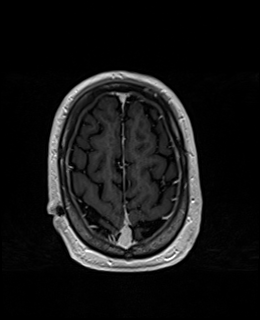
[im 160/160]
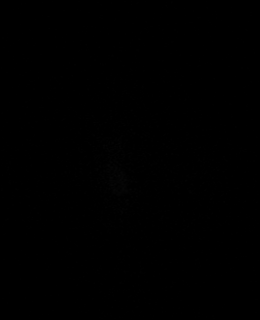

[Series 13: T1 post-contrast · coronal · 3.0mm · 0.57mm/px · 2 of 47 slices shown (2 of 2)]
[im 1/47]
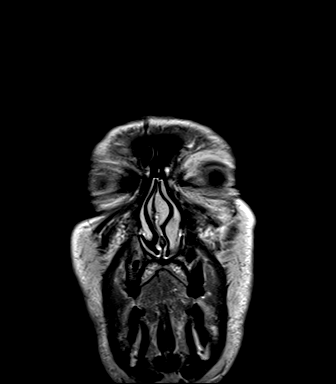
[im 47/47]
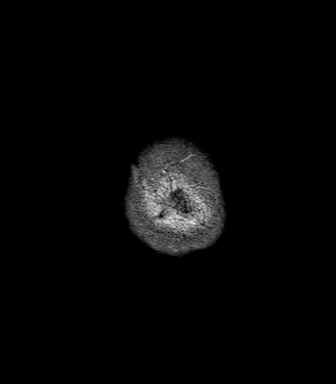

[Series 14: FLAIR post-contrast · sagittal · 3.0mm · 0.75mm/px · 2 of 39 slices shown]
[im 1/39]
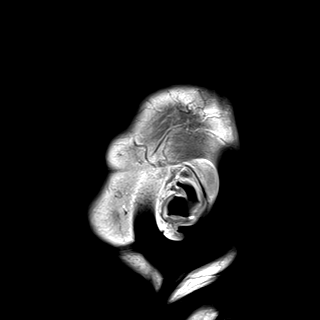
[im 39/39]
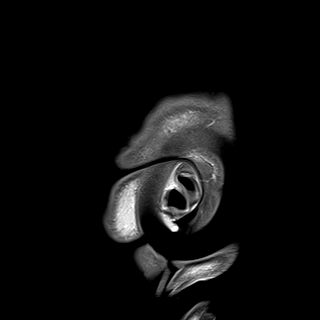

[48 of 48 positions shown; findings below may reference images not displayed]

FINDINGS: Brain: Postoperative changes again identified with resection cavity
in the inferior right parietal lobe. There is new nodular
enhancement at the deep margin measuring 3 mm (series 11, image 95).
Nearby additional nodular new focus of enhancement also measuring 3
mm (series 11, image 97). Stable surrounding T2 FLAIR
hyperintensity.

No new mass or abnormal enhancement remote from the operative site.

There is no acute infarction or intracranial hemorrhage. No
hydrocephalus.

Vascular: Major vessel flow voids at the skull base are preserved.

Skull and upper cervical spine: Normal marrow signal is preserved.
Right parietal craniotomy.

Sinuses/Orbits: Paranasal sinus mucosal thickening. Orbits are
unremarkable.

Other: Sella is unremarkable.  Mastoid air cells are clear.
IMPRESSION: Two new small foci of enhancement at the deep margin of the
resection cavity. Closer follow-up is recommended.

No new lesion remote from operative site.

## 2021-10-06 MED ORDER — GADOBENATE DIMEGLUMINE 529 MG/ML IV SOLN
15.0000 mL | Freq: Once | INTRAVENOUS | Status: AC | PRN
  Administered 2021-10-06: 15 mL via INTRAVENOUS

## 2021-10-09 ENCOUNTER — Other Ambulatory Visit: Payer: Self-pay

## 2021-10-09 ENCOUNTER — Telehealth: Payer: Self-pay | Admitting: Internal Medicine

## 2021-10-09 ENCOUNTER — Inpatient Hospital Stay: Payer: Medicare Other | Attending: Oncology | Admitting: Internal Medicine

## 2021-10-09 VITALS — BP 134/71 | HR 67 | Temp 98.1°F | Resp 18 | Wt 163.2 lb

## 2021-10-09 DIAGNOSIS — C7931 Secondary malignant neoplasm of brain: Secondary | ICD-10-CM

## 2021-10-09 DIAGNOSIS — C4359 Malignant melanoma of other part of trunk: Secondary | ICD-10-CM | POA: Diagnosis not present

## 2021-10-09 DIAGNOSIS — Z923 Personal history of irradiation: Secondary | ICD-10-CM | POA: Insufficient documentation

## 2021-10-09 DIAGNOSIS — Z9221 Personal history of antineoplastic chemotherapy: Secondary | ICD-10-CM | POA: Diagnosis not present

## 2021-10-09 NOTE — Progress Notes (Signed)
[facility] [place], [place] [address] [phone]   Interval Evaluation  Date of Service: 10/09/21 Patient Name: JETER TOMEY Patient MRN: [id number] Patient DOB: [birth year] Provider: Ventura Sellers, MD  Identifying Statement:  NASIR BRIGHT is a 65 y.o. male with Metastasis to brain [facility]) [C79.31]   Primary Cancer:  Oncologic History: Oncology History  Melanoma of skin ([place])  09/07/2020 Initial Diagnosis   Melanoma of skin ([place])   09/22/2020 -  Chemotherapy      Patient is on Antibody Plan: LUNG NIVOLUMAB Q28D     07/28/2021 Cancer Staging   Staging form: Melanoma of the Skin, AJCC 8th Edition - Clinical: Stage IV (cTX, cNX, pM1a(1)) - Signed by Wyatt Portela, MD on 07/28/2021    Metastasis to brain [facility]) (Resolved)  08/05/2020 Surgery   Craniotomy, resection of 4.7cm R parietal mass with Dr. Marcello Moores; path c/w melanoma    - 08/31/2020 Radiation Therapy   Completes 27Gy/70fx to resection cavity with Dr. Lisbeth Renshaw     Interval History:  [name] presents today for follow up after recent MRI brain.  Denies new or progressive neurologic deficits, no return of left sided weakness.  No longer on Nivolumab, imaging surveillance only.  Cycling or walking at least 5 miles per day.    H+P (11/29/20) Patient presented to clinical attention in August 2021 with several weeks history of left arm and leg weakness.  He experienced a fall at home and was unable to get up, which prompted evaluation and CNS imaging.  MRI demonstrated large enhancing mass within right parietal lobe consistent with metastatic disease.  He underwent craniotomy and gross resection with Dr. Marcello Moores on 08/05/20, which was followed by post-op SRS in 3 fractions with Dr. Lisbeth Renshaw, completed on 08/31/20.  He has completed decadron taper.  At present time he has no motor complaints and no subjective neurologic deficits.  Is undergoing rx with Nivolumab with Dr.  Alen Blew.  Very active at home, walks and cycles every day.  Medications: Current Outpatient Medications on File Prior to Visit  Medication Sig Dispense Refill   COVID-19 mRNA bivalent vaccine, Pfizer, (PFIZER COVID-19 VAC BIVALENT) injection Inject into the muscle. 0.3 mL 0   Multiple Vitamin (MULTI-VITAMIN) tablet Take 1 tablet by mouth daily.     No current facility-administered medications on file prior to visit.    Allergies: No Known Allergies Past Medical History:  Past Medical History:  Diagnosis Date   Melanoma ([place])    back   Open wound    back   Past Surgical History:  Past Surgical History:  Procedure Laterality Date   APPLICATION OF CRANIAL NAVIGATION N/A 08/05/2020   Procedure: APPLICATION OF CRANIAL NAVIGATION;  Surgeon: Vallarie Mare, MD;  Location: [place];  Service: Neurosurgery;  Laterality: N/A;   CRANIOTOMY N/A 08/05/2020   Procedure: CRANIOTOMY TUMOR EXCISION;  Surgeon: Vallarie Mare, MD;  Location: [place];  Service: Neurosurgery;  Laterality: N/A;   MELANOMA EXCISION WITH SENTINEL LYMPH NODE BIOPSY N/A 04/01/2019   Procedure: WIDE LOCAL EXCISION OF BACK MELANOMA WITH RIGHT AXILLARY SENTINEL LYMPH NODE BIOPSY;  Surgeon: Stark Klein, MD;  Location: [facility] [place];  Service: General;  Laterality: N/A;   SKIN SPLIT GRAFT N/A 04/10/2019   Procedure: SPLIT THICKNESS SKIN GRAFT FROM RIGHT  THIGH TO THE BACK;  Surgeon: Irene Limbo, MD;  Location: [place] [facility] [place];  Service: Plastics;  Laterality: N/A;   Social History:  Social History   Socioeconomic History   Marital status: Single    Spouse name: Not on file   Number of children: Not on file   Years of education: Not on file   Highest education level: Not on file  Occupational History   Not on file  Tobacco Use   Smoking status: Never   Smokeless tobacco: Never  Vaping Use   Vaping Use: Never used  Substance and Sexual Activity   Alcohol use: Never   Drug use: Never   Sexual activity: Not on file  Other Topics  Concern   Not on file  Social History Narrative   Not on file   Social Determinants of Health   Financial Resource Strain: Not on file  Food Insecurity: Not on file  Transportation Needs: Not on file  Physical Activity: Not on file  Stress: Not on file  Social Connections: Not on file  Intimate Partner Violence: Not on file   Family History: No family history on file.  Review of Systems: Constitutional: Doesn't report fevers, chills or abnormal weight loss Eyes: Doesn't report blurriness of vision Ears, nose, mouth, throat, and face: Doesn't report sore throat Respiratory: Doesn't report cough, dyspnea or wheezes Cardiovascular: Doesn't report palpitation, chest discomfort  Gastrointestinal:  Doesn't report nausea, constipation, diarrhea GU: Doesn't report incontinence Skin: Doesn't report skin rashes Neurological: Per HPI Musculoskeletal: Doesn't report joint pain Behavioral/Psych: Doesn't report anxiety  Physical Exam: Vitals:   10/09/21 1036  BP: 134/71  Pulse: 67  Resp: 18  Temp: 98.1 F (36.7 C)  SpO2: 98%    KPS: 90. General: Alert, cooperative, pleasant, in no acute distress Head: Normal EENT: No conjunctival injection or scleral icterus.  Lungs: Resp effort normal Cardiac: Regular rate Abdomen: Non-distended abdomen Skin: No rashes cyanosis or petechiae. Extremities: No clubbing or edema  Neurologic Exam: Mental Status: Awake, alert, attentive to examiner. Oriented to self and environment. Language is fluent with intact comprehension.  Cranial Nerves: Visual acuity is grossly normal. Visual fields are full. Extra-ocular movements intact. No ptosis. Face is symmetric Motor: Tone and bulk are normal. Power is full in both arms and legs. Reflexes are symmetric, no pathologic reflexes present.  Sensory: Intact to light touch Gait: Normal.   Labs: I have reviewed the data as listed    Component Value Date/Time   NA 141 08/25/2021 0953   K 4.3  08/25/2021 0953   CL 105 08/25/2021 0953   CO2 26 08/25/2021 0953   GLUCOSE 92 08/25/2021 0953   BUN 19 08/25/2021 0953   CREATININE 1.09 08/25/2021 0953   CREATININE 1.19 07/27/2020 0836   CALCIUM 9.6 08/25/2021 0953   PROT 7.0 08/25/2021 0953   ALBUMIN 4.2 08/25/2021 0953   AST 21 08/25/2021 0953   ALT 18 08/25/2021 0953   ALKPHOS 60 08/25/2021 0953   BILITOT 0.6 08/25/2021 0953   GFRNONAA >60 08/25/2021 0953   GFRNONAA 63 07/27/2020 0836   GFRAA >60 08/07/2020 0429   GFRAA 73 07/27/2020 0836   Lab Results  Component Value Date   WBC 7.6 08/25/2021   NEUTROABS 5.2 08/25/2021   HGB 14.7 08/25/2021   HCT 45.4 08/25/2021   MCV 85.0 08/25/2021   PLT 201 08/25/2021    Imaging:  MR BRAIN W WO CONTRAST  Result Date: 10/06/2021 CLINICAL DATA:  Brain/CNS neoplasm, assess treatment response; metastatic melanoma EXAM: MRI HEAD WITHOUT AND WITH CONTRAST TECHNIQUE: Multiplanar, multiecho pulse sequences of the brain and surrounding structures were obtained without and with  intravenous contrast. CONTRAST:  79mL MULTIHANCE GADOBENATE DIMEGLUMINE 529 MG/ML IV SOLN COMPARISON:  06/06/2021 FINDINGS: Brain: Postoperative changes again identified with resection cavity in the inferior right parietal lobe. There is new nodular enhancement at the deep margin measuring 3 mm (series 11, image 95). Nearby additional nodular new focus of enhancement also measuring 3 mm (series 11, image 97). Stable surrounding T2 FLAIR hyperintensity. No new mass or abnormal enhancement remote from the operative site. There is no acute infarction or intracranial hemorrhage. No hydrocephalus. Vascular: Major vessel flow voids at the skull base are preserved. Skull and upper cervical spine: Normal marrow signal is preserved. Right parietal craniotomy. Sinuses/Orbits: Paranasal sinus mucosal thickening. Orbits are unremarkable. Other: Sella is unremarkable.  Mastoid air cells are clear. IMPRESSION: Two new small foci of  enhancement at the deep margin of the resection cavity. Closer follow-up is recommended. No new lesion remote from operative site. Electronically Signed   By: Macy Mis M.D.   On: 10/06/2021 12:28     [facility] Clinician Interpretation: I have personally reviewed the radiological images as listed.  My interpretation, in the context of the patient's clinical presentation, is treatment effect vs true progression   Assessment/Plan Brain Metastasis  DORRIEN GRUNDER is clinically stable today.  MRI brain demonstrates two tiny foci of enhancement adjacent to the resection cavity and treatment field.  These were visible on prior studies, but were even smaller and fainter.  Suspect etiology is blood brain barrier disruption from radiation treatment effect, vs actual tumor progression.  We again encouraged him to remain active and continue aerobic exercise regimen.  We spent twenty additional minutes teaching regarding the natural history, biology, and historical experience in the treatment of neurologic complications of cancer.   We appreciate the opportunity to participate in the care of HORRACE HANAK.   We ask that [name] return to clinic in 4 months following next brain MRI, or sooner as needed.  All questions were answered. The patient knows to call the clinic with any problems, questions or concerns. No barriers to learning were detected.  The total time spent in the encounter was 30 minutes and more than 50% was on counseling and review of test results   Ventura Sellers, MD Medical Director of Neuro-Oncology [facility] at [place] 10/09/21 10:28 AM

## 2021-10-09 NOTE — Telephone Encounter (Signed)
Scheduled per 10/31 los, pt has been called and confirmed appt 

## 2021-11-21 ENCOUNTER — Other Ambulatory Visit: Payer: Self-pay

## 2021-11-21 ENCOUNTER — Ambulatory Visit (HOSPITAL_COMMUNITY)
Admission: RE | Admit: 2021-11-21 | Discharge: 2021-11-21 | Disposition: A | Discharge status: Home / Self Care | Payer: Medicare Other | Source: Ambulatory Visit | Attending: Oncology | Admitting: Oncology

## 2021-11-21 ENCOUNTER — Inpatient Hospital Stay: Payer: Medicare Other | Attending: Oncology

## 2021-11-21 DIAGNOSIS — I700 Atherosclerosis of aorta: Secondary | ICD-10-CM | POA: Insufficient documentation

## 2021-11-21 DIAGNOSIS — E041 Nontoxic single thyroid nodule: Secondary | ICD-10-CM | POA: Insufficient documentation

## 2021-11-21 DIAGNOSIS — K769 Liver disease, unspecified: Secondary | ICD-10-CM | POA: Diagnosis not present

## 2021-11-21 DIAGNOSIS — Z85820 Personal history of malignant melanoma of skin: Secondary | ICD-10-CM | POA: Diagnosis not present

## 2021-11-21 DIAGNOSIS — N281 Cyst of kidney, acquired: Secondary | ICD-10-CM | POA: Diagnosis not present

## 2021-11-21 DIAGNOSIS — C439 Malignant melanoma of skin, unspecified: Secondary | ICD-10-CM

## 2021-11-21 DIAGNOSIS — K449 Diaphragmatic hernia without obstruction or gangrene: Secondary | ICD-10-CM | POA: Diagnosis not present

## 2021-11-21 DIAGNOSIS — C4359 Malignant melanoma of other part of trunk: Secondary | ICD-10-CM | POA: Insufficient documentation

## 2021-11-21 DIAGNOSIS — M47816 Spondylosis without myelopathy or radiculopathy, lumbar region: Secondary | ICD-10-CM | POA: Diagnosis not present

## 2021-11-21 LAB — CMP (CANCER CENTER ONLY)
ALT: 18 U/L (ref 0–44)
AST: 19 U/L (ref 15–41)
Albumin: 4.1 g/dL (ref 3.5–5.0)
Alkaline Phosphatase: 53 U/L (ref 38–126)
Anion gap: 9 (ref 5–15)
BUN: 17 mg/dL (ref 8–23)
CO2: 25 mmol/L (ref 22–32)
Calcium: 9.2 mg/dL (ref 8.9–10.3)
Chloride: 106 mmol/L (ref 98–111)
Creatinine: 1.08 mg/dL (ref 0.61–1.24)
GFR, Estimated: >60 mL/min (ref >60)
Glucose, Bld: 89 mg/dL (ref 70–99)
Potassium: 4.4 mmol/L (ref 3.5–5.1)
Sodium: 140 mmol/L (ref 135–145)
Total Bilirubin: 0.5 mg/dL (ref 0.3–1.2)
Total Protein: 7 g/dL (ref 6.5–8.1)

## 2021-11-21 LAB — CBC WITH DIFFERENTIAL (CANCER CENTER ONLY)
Abs Immature Granulocytes: 0.01 10*3/uL (ref 0.00–0.07)
Basophils Absolute: 0.1 10*3/uL (ref 0.0–0.1)
Basophils Relative: 1 %
Eosinophils Absolute: 0.3 10*3/uL (ref 0.0–0.5)
Eosinophils Relative: 5 %
HCT: 45.2 % (ref 39.0–52.0)
Hemoglobin: 14.8 g/dL (ref 13.0–17.0)
Immature Granulocytes: 0 %
Lymphocytes Relative: 25 %
Lymphs Abs: 1.6 10*3/uL (ref 0.7–4.0)
MCH: 27.8 pg (ref 26.0–34.0)
MCHC: 32.7 g/dL (ref 30.0–36.0)
MCV: 85 fL (ref 80.0–100.0)
Monocytes Absolute: 0.3 10*3/uL (ref 0.1–1.0)
Monocytes Relative: 5 %
Neutro Abs: 4 10*3/uL (ref 1.7–7.7)
Neutrophils Relative %: 64 %
Platelet Count: 197 10*3/uL (ref 150–400)
RBC: 5.32 MIL/uL (ref 4.22–5.81)
RDW: 13.2 % (ref 11.5–15.5)
WBC Count: 6.3 10*3/uL (ref 4.0–10.5)
nRBC: 0 % (ref 0.0–0.2)

## 2021-11-21 LAB — TSH: TSH: 3.023 u[IU]/mL (ref 0.320–4.118)

## 2021-11-21 IMAGING — CT CT CHEST-ABD-PELV W/ CM
2 of 5 series · 12 of 36 positions shown, 14 images · IV contrast (APPLIED)
Comparison: Multiple priors including most recent CT [DATE] and MRI abdomen [DATE]

CLINICAL DATA: History of metastatic melanoma,

EXAM:
CT CHEST, ABDOMEN, AND PELVIS WITH CONTRAST
TECHNIQUE: Multidetector CT imaging of the chest, abdomen and pelvis was
performed following the standard protocol during bolus
administration of intravenous contrast.
CONTRAST:  80mL OMNIPAQUE IOHEXOL 350 MG/ML SOLN

[Series 2: cap with · axial · 0.79mm/px · z∈[-590,-64]mm · 9 of 133 slices shown, 11 images]
[im 14/133  mediastinal]
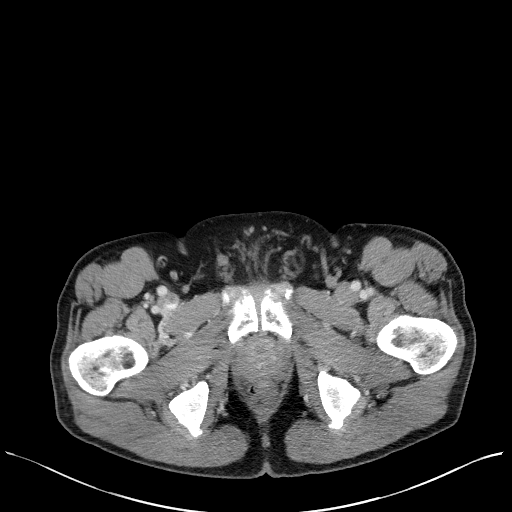
[im 14/133  bone]
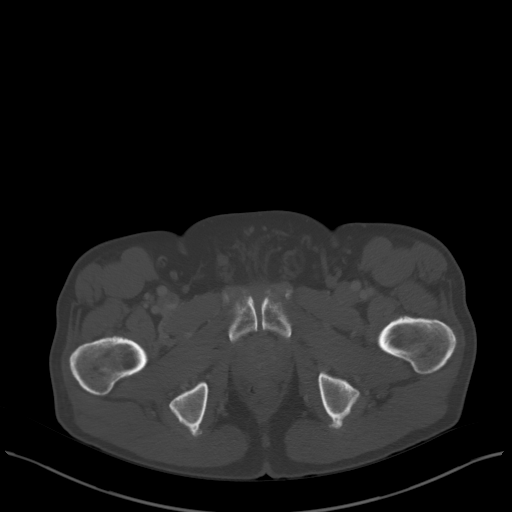
[im 27/133  mediastinal]
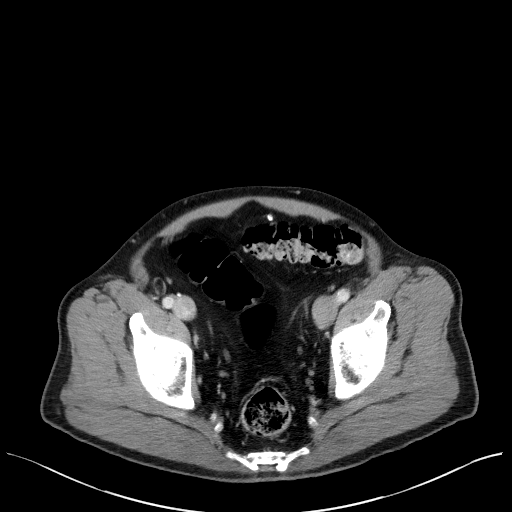
[im 40/133  mediastinal]
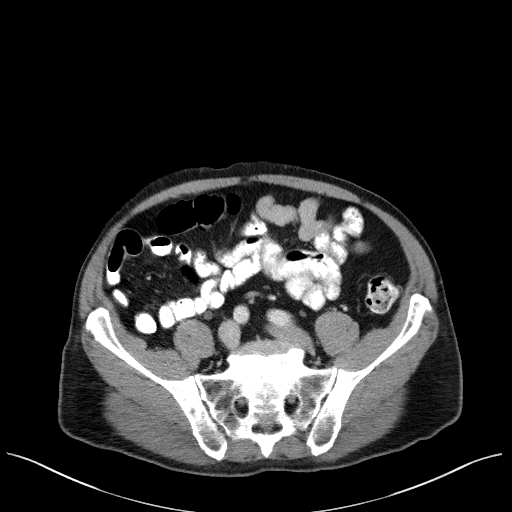
[im 53/133  mediastinal]
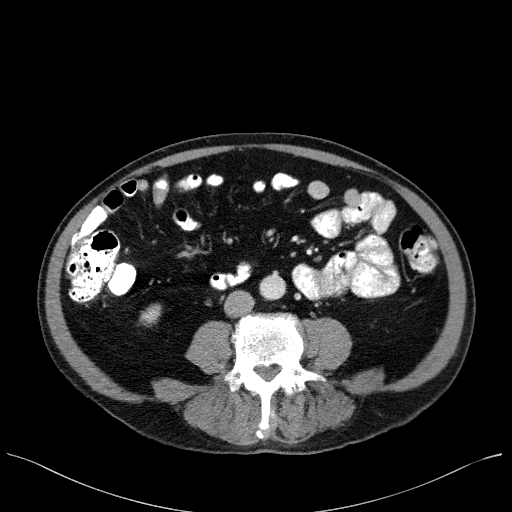
[im 67/133  mediastinal]
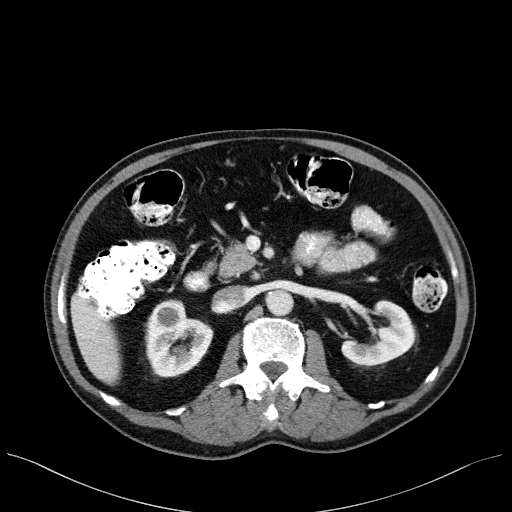
[im 80/133  mediastinal]
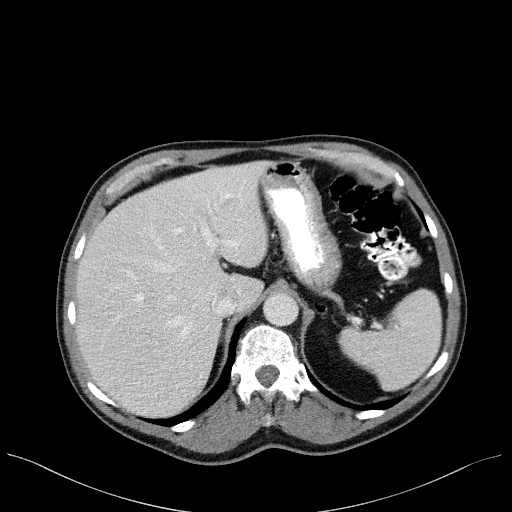
[im 93/133  mediastinal]
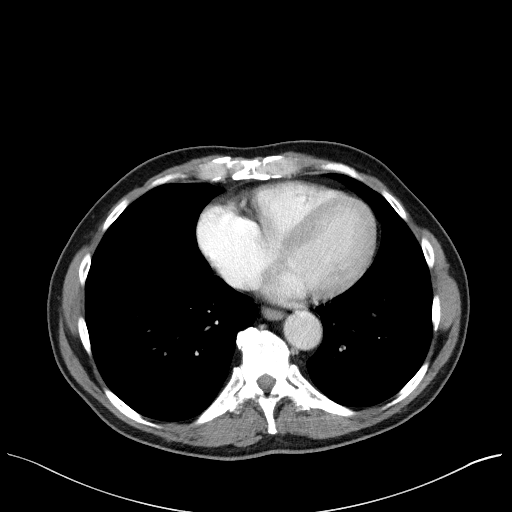
[im 106/133  mediastinal]
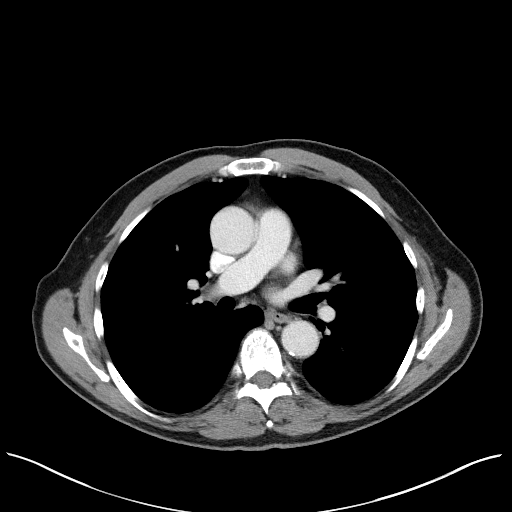
[im 119/133  mediastinal]
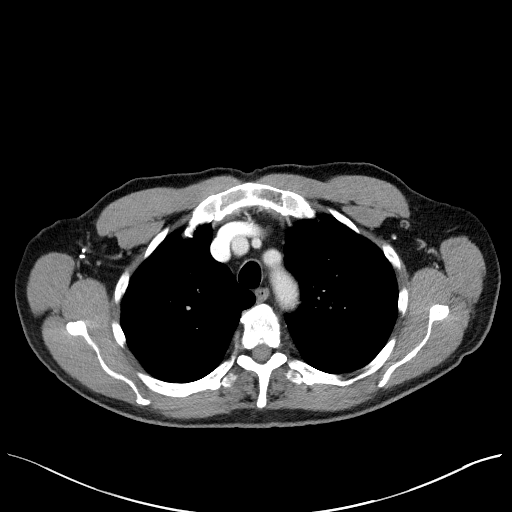
[im 119/133  bone]
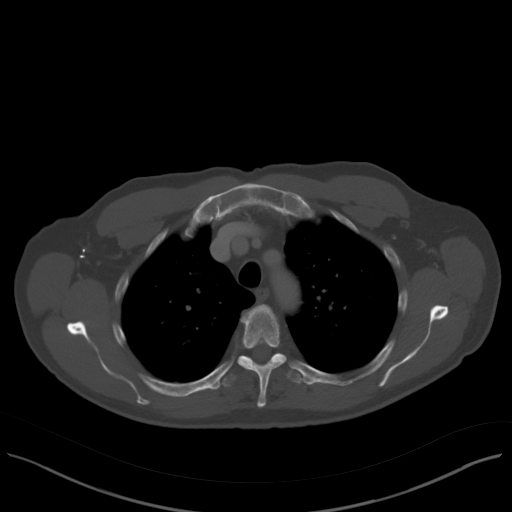

[Series 4: coronals · coronal · 0.73mm/px · 3 of 141 slices shown]
[im 29/141  mediastinal]
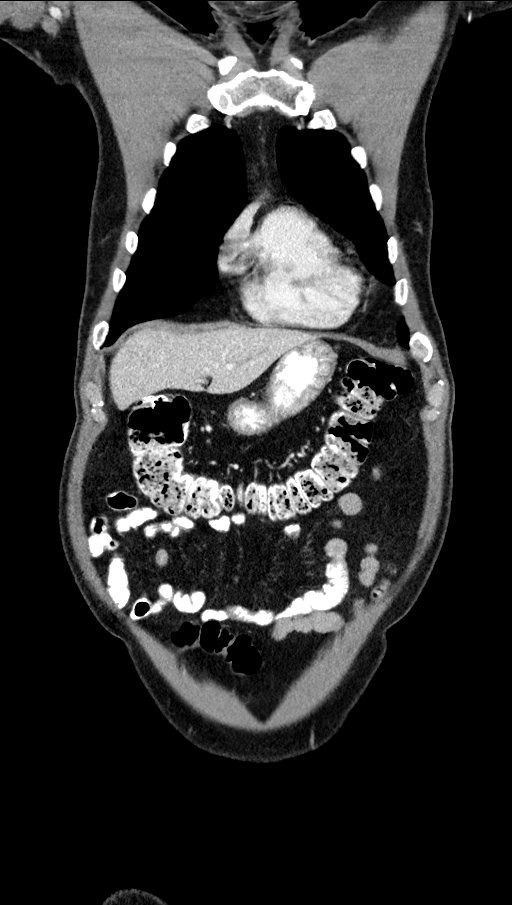
[im 57/141  mediastinal]
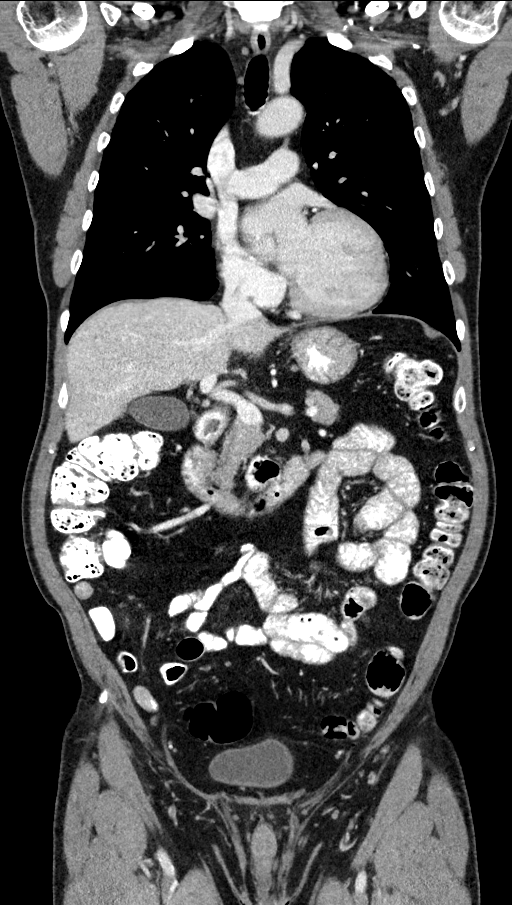
[im 85/141  mediastinal]
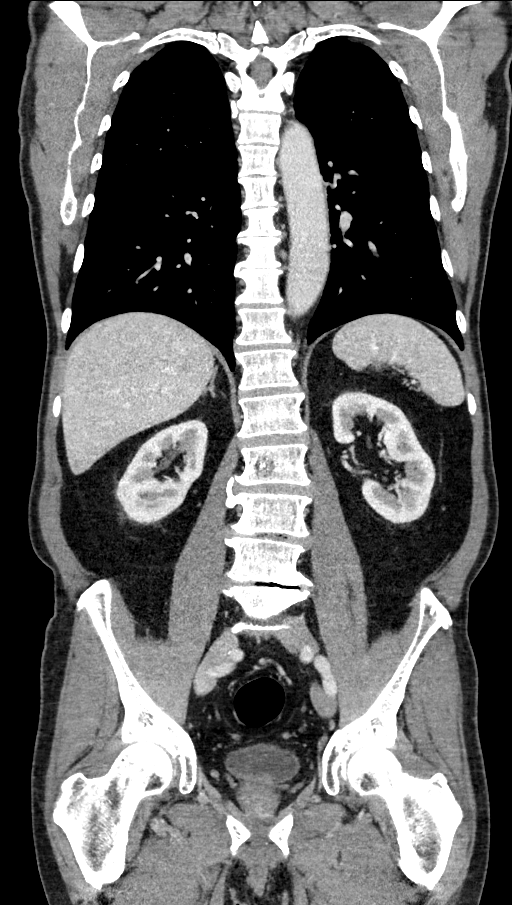

[12 of 36 positions shown; findings below may reference images not displayed]

FINDINGS: CT CHEST FINDINGS

Cardiovascular: Scattered aortic atherosclerosis without aneurysmal
dilation. No central pulmonary embolus on this nondedicated study.
Normal size heart. No significant pericardial effusion/thickening.

Mediastinum/Nodes: No supraclavicular adenopathy. Heterogeneous
cm nodule in the right lobe of the thyroid on image [DATE], difficult
to appreciate on some prior studies due to contrast timing and
streak artifact but present on neck CT [DATE] measuring
cm. No pathologically enlarged mediastinal, hilar or axillary lymph
nodes. Prior right axillary lymph node dissection.

Lungs/Pleura: No suspicious pulmonary nodules or masses. No focal
airspace consolidation. No pleural effusion. No pneumothorax.

Musculoskeletal: No aggressive lytic or blastic lesion of bone.

CT ABDOMEN PELVIS FINDINGS

Hepatobiliary: Hypodense posterior segment VII hepatic lesion
measures 10 mm on image 52/2, unchanged in size and previously
characterized as likely benign on MRI abdomen [DATE], and
was subtly present dating back to CT [DATE]. No new
suspicious hepatic lesion. Gallbladder is unremarkable. No biliary
ductal dilation.

Pancreas: No pancreatic ductal dilation or evidence of acute
inflammation.

Spleen: Normal in size without focal abnormality.

Adrenals/Urinary Tract: Bilateral adrenal glands are unremarkable.
Stable bilateral renal cysts. No solid enhancing renal mass. Urinary
bladder is unremarkable for degree of distension.

Stomach/Bowel: Radiopaque enteric contrast material traverses the
sigmoid colon. Small hiatal hernia. Stable tiny gastric diverticulum
on image 53/2. No pathologic dilation of small or large bowel. The
appendix and terminal ileum appear normal. No suspicious colonic
wall thickening or mass like lesions identified.

Vascular/Lymphatic: Scattered aortic atherosclerosis without
abdominal aortic aneurysm. No pathologically enlarged abdominal or
pelvic lymph nodes.

Reproductive: Prostate gland and seminal vesicles are within normal
limits.

Other: No significant abdominopelvic free fluid. No
pneumoperitoneum.

Musculoskeletal: Stable multilevel degenerative change of the lumbar
spine. No aggressive lytic or blastic lesion of bone.
IMPRESSION: 1. No evidence of new or progressive metastatic disease within the
chest, abdomen, or pelvis.
2. Stable hypodense 10 mm peripheral segment VII hepatic lesion
which is likely benign based on prior MRI and relative stability
dating back to [DATE]. Continued attention on follow-up
imaging is suggested.
3. Heterogeneous 1.8 cm nodule in the right lobe of the thyroid
gland, difficult to appreciate on some prior studies due to contrast
timing and streak artifact but present on neck CT [DATE]
measuring 1.1 cm. Recommend thyroid US. Reference: [HOSPITAL].
[DATE]): 143-50
4.  Aortic Atherosclerosis ([MU]-[MU]).

## 2021-11-21 MED ORDER — SODIUM CHLORIDE (PF) 0.9 % IJ SOLN
INTRAMUSCULAR | Status: AC
  Filled 2021-11-21: qty 50

## 2021-11-21 MED ORDER — IOHEXOL 350 MG/ML SOLN
80.0000 mL | Freq: Once | INTRAVENOUS | Status: AC | PRN
  Administered 2021-11-21: 80 mL via INTRAVENOUS

## 2021-11-28 ENCOUNTER — Inpatient Hospital Stay (HOSPITAL_BASED_OUTPATIENT_CLINIC_OR_DEPARTMENT_OTHER): Payer: Medicare Other | Admitting: Oncology

## 2021-11-28 ENCOUNTER — Other Ambulatory Visit: Payer: Self-pay

## 2021-11-28 VITALS — BP 156/84 | HR 62 | Temp 98.1°F | Resp 17 | Ht 69.0 in | Wt 164.5 lb

## 2021-11-28 DIAGNOSIS — C439 Malignant melanoma of skin, unspecified: Secondary | ICD-10-CM | POA: Diagnosis not present

## 2021-11-28 DIAGNOSIS — E041 Nontoxic single thyroid nodule: Secondary | ICD-10-CM | POA: Diagnosis not present

## 2021-11-28 DIAGNOSIS — I700 Atherosclerosis of aorta: Secondary | ICD-10-CM | POA: Diagnosis not present

## 2021-11-28 DIAGNOSIS — C4359 Malignant melanoma of other part of trunk: Secondary | ICD-10-CM | POA: Diagnosis not present

## 2021-11-28 NOTE — Progress Notes (Signed)
Hematology and Oncology Follow Up Visit  Walter Rodriguez [id number] November 19, 1956 65 y.o. 11/28/2021 8:59 AM [name], MDWhite, [name], MD   Principle Diagnosis: 65 year old man with melanoma of the upper back diagnosed in 2020.  He presented with T4N0 and subsequently stage IV with CNS metastasis in August 2021.    Prior Therapy:  He is status post local wide excision as well as right axillary sentinel lymph node sampling completed by Dr. Barry Dienes on April 01, 2019.  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 Iran Planas on Apr 10, 2019.  He is status post right craniotomy and excision of brain tumor completed on August 05, 2020 by Dr. Marcello Moores.  He status post radiation therapy following his craniotomy completed on August 25, 2020.  He received 27 Gy in 3 fractions utilizing stereotactic radiosurgery.  Nivolumab 480 mg every 4 weeks started on September 22, 2020.  He completed 12 cycles of therapy in August 2022.   Current therapy: Active surveillance.  Interim History: Mr. Walter Rodriguez is here for a follow-up visit.  Since the last visit, he reports no major changes in his health.  He denies any nausea, vomiting or abdominal pain.  He denies any recent hospitalizations or illnesses.  He denies any skin rashes or lesions.  His performance status remained excellent.   Medications: Reviewed without changes. Current Outpatient Medications  Medication Sig Dispense Refill   COVID-19 mRNA bivalent vaccine, Pfizer, (PFIZER COVID-19 VAC BIVALENT) injection Inject into the muscle. 0.3 mL 0   Multiple Vitamin (MULTI-VITAMIN) tablet Take 1 tablet by mouth daily.     No current facility-administered medications for this visit.     Allergies: No Known Allergies      Physical Exam:     Blood pressure (!) 156/84, pulse 62, temperature 98.1 F (36.7 C), temperature source Temporal, resp. rate 17, height 5'  9" (1.753 m), weight 164 lb 8 oz (74.6 kg), SpO2 100 %.       ECOG: 0     General appearance: Comfortable appearing without any discomfort Head: Normocephalic without any trauma Oropharynx: Mucous membranes are moist and pink without any thrush or ulcers. Eyes: Pupils are equal and round reactive to light. Lymph nodes: No cervical, supraclavicular, inguinal or axillary lymphadenopathy.   Heart:regular rate and rhythm.  S1 and S2 without leg edema. Lung: Clear without any rhonchi or wheezes.  No dullness to percussion. Abdomin: Soft, nontender, nondistended with good bowel sounds.  No hepatosplenomegaly. Musculoskeletal: No joint deformity or effusion.  Full range of motion noted. Neurological: No deficits noted on motor, sensory and deep tendon reflex exam. Skin: No petechial rash or dryness.  Appeared moist.                 Lab Results: Lab Results  Component Value Date   WBC 6.3 11/21/2021   HGB 14.8 11/21/2021   HCT 45.2 11/21/2021   MCV 85.0 11/21/2021   PLT 197 11/21/2021     Chemistry      Component Value Date/Time   NA 140 11/21/2021 0954   K 4.4 11/21/2021 0954   CL 106 11/21/2021 0954   CO2 25 11/21/2021 0954   BUN 17 11/21/2021 0954   CREATININE 1.08 11/21/2021 0954   CREATININE 1.19 07/27/2020 0836      Component Value Date/Time   CALCIUM 9.2 11/21/2021 0954   ALKPHOS 53 11/21/2021 0954   AST 19 11/21/2021 0954  ALT 18 11/21/2021 0954   BILITOT 0.5 11/21/2021 0954       IMPRESSION: 1. No evidence of new or progressive metastatic disease within the chest, abdomen, or pelvis. 2. Stable hypodense 10 mm peripheral segment VII hepatic lesion which is likely benign based on prior MRI and relative stability dating back to March 03, 2019. Continued attention on follow-up imaging is suggested. 3. Heterogeneous 1.8 cm nodule in the right lobe of the thyroid gland, difficult to appreciate on some prior studies due to contrast timing and  streak artifact but present on neck CT March 03, 2019 measuring 1.1 cm. Recommend thyroid US. Reference: J Am Coll Radiol. 2015 Feb;12(2): 143-50 4.  Aortic Atherosclerosis (ICD10-I70.0).     65 year old with:  1.  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  I recommended continued active surveillance for the time being and we will update his staging scan in 6 months.  He is agreeable to continue at this time.  Salvage therapy with ipilimumab on other agents could be considered if you develop relapsed disease.  2.  CNS metastasis: Last MRI obtained in October 2022 showed no evidence of disease progression.  3.  Dermatology surveillance:He is to continue with dermatology follow-up at this time given his high risk.  4.  Thyroid nodule: Unclear etiology at this time and will be monitored on future scans.  Ultrasounds and a biopsy could be considered at that time.  5.  Follow-up: In 6 months for a follow-up.   30  minutes were spent on this encounter.  The time was dedicated to reviewing laboratory data, disease status update, treatment choices and future plan of care discussion.      Zola Button, MD 12/20/20228:59 AM

## 2021-12-07 ENCOUNTER — Telehealth: Payer: Self-pay | Admitting: Internal Medicine

## 2021-12-07 NOTE — Telephone Encounter (Signed)
Sch per 12/20 los, pt aware °

## 2022-01-29 DIAGNOSIS — D2230 Melanocytic nevi of unspecified part of face: Secondary | ICD-10-CM | POA: Diagnosis not present

## 2022-01-29 DIAGNOSIS — L821 Other seborrheic keratosis: Secondary | ICD-10-CM | POA: Diagnosis not present

## 2022-01-29 DIAGNOSIS — D225 Melanocytic nevi of trunk: Secondary | ICD-10-CM | POA: Diagnosis not present

## 2022-01-29 DIAGNOSIS — D485 Neoplasm of uncertain behavior of skin: Secondary | ICD-10-CM | POA: Diagnosis not present

## 2022-01-29 DIAGNOSIS — Z23 Encounter for immunization: Secondary | ICD-10-CM | POA: Diagnosis not present

## 2022-01-29 DIAGNOSIS — C7931 Secondary malignant neoplasm of brain: Secondary | ICD-10-CM | POA: Diagnosis not present

## 2022-01-29 DIAGNOSIS — D224 Melanocytic nevi of scalp and neck: Secondary | ICD-10-CM | POA: Diagnosis not present

## 2022-01-29 DIAGNOSIS — L578 Other skin changes due to chronic exposure to nonionizing radiation: Secondary | ICD-10-CM | POA: Diagnosis not present

## 2022-01-29 DIAGNOSIS — L570 Actinic keratosis: Secondary | ICD-10-CM | POA: Diagnosis not present

## 2022-01-29 DIAGNOSIS — Z85828 Personal history of other malignant neoplasm of skin: Secondary | ICD-10-CM | POA: Diagnosis not present

## 2022-01-29 DIAGNOSIS — L309 Dermatitis, unspecified: Secondary | ICD-10-CM | POA: Diagnosis not present

## 2022-02-02 ENCOUNTER — Ambulatory Visit
Admission: RE | Admit: 2022-02-02 | Discharge: 2022-02-02 | Disposition: A | Discharge status: Home / Self Care | Payer: Medicare Other | Source: Ambulatory Visit | Attending: Internal Medicine | Admitting: Internal Medicine

## 2022-02-02 DIAGNOSIS — C7931 Secondary malignant neoplasm of brain: Secondary | ICD-10-CM

## 2022-02-02 DIAGNOSIS — Z85820 Personal history of malignant melanoma of skin: Secondary | ICD-10-CM | POA: Diagnosis not present

## 2022-02-02 DIAGNOSIS — J3489 Other specified disorders of nose and nasal sinuses: Secondary | ICD-10-CM | POA: Diagnosis not present

## 2022-02-02 DIAGNOSIS — I611 Nontraumatic intracerebral hemorrhage in hemisphere, cortical: Secondary | ICD-10-CM | POA: Diagnosis not present

## 2022-02-02 DIAGNOSIS — C719 Malignant neoplasm of brain, unspecified: Secondary | ICD-10-CM | POA: Diagnosis not present

## 2022-02-02 IMAGING — MR MR HEAD WO/W CM
13 series · 48 of 48 positions shown · IV contrast (multihance)
Comparison: Head MRI [DATE]

CLINICAL DATA: Brain/CNS neoplasm, assess treatment response.
Metastatic melanoma.

EXAM:
MRI HEAD WITHOUT AND WITH CONTRAST
TECHNIQUE: Multiplanar, multiecho pulse sequences of the brain and surrounding
structures were obtained without and with intravenous contrast.
CONTRAST:  15mL MULTIHANCE GADOBENATE DIMEGLUMINE 529 MG/ML IV SOLN

[Series 2: FLAIR · sagittal · 3.0mm · 0.75mm/px · 2 of 39 slices shown (1 of 2)]
[im 1/39]
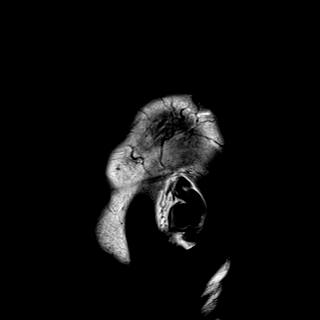
[im 39/39]
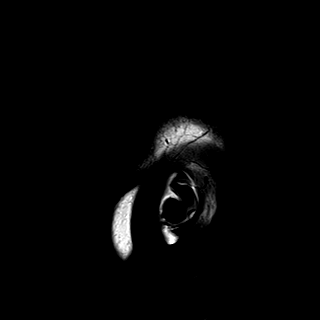

[Series 3: DWI · axial · 3.0mm · 1.50mm/px · z∈[-92,+60]mm · 4 of 80 slices shown (1 of 2)]
[im 1/80]
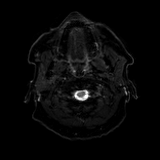
[im 27/80]
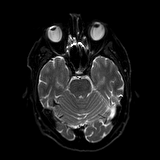
[im 53/80]
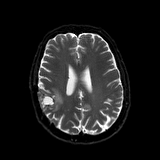
[im 80/80]
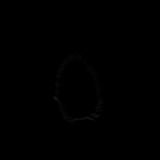

[Series 4: DWI · axial · 3.0mm · 1.50mm/px · z∈[-92,+60]mm · 2 of 37 slices shown (2 of 2)]
[im 1/37]
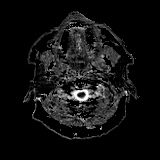
[im 37/37]
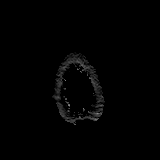

[Series 5: T2 · axial · 5.0mm · 0.57mm/px · 1 of 27 slices shown (1 of 2)]
[im 1/27]
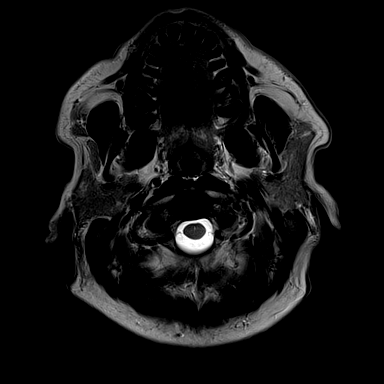

[Series 6: swi_images · axial · 1.5mm · 0.90mm/px · z∈[-92,+62]mm · 5 of 104 slices shown]
[im 1/104]
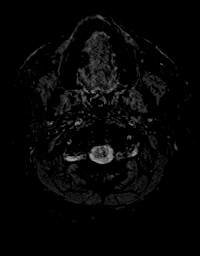
[im 26/104]
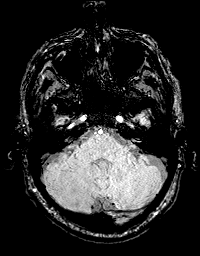
[im 52/104]
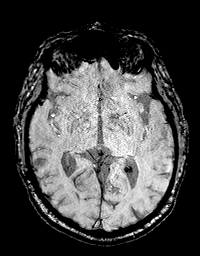
[im 78/104]
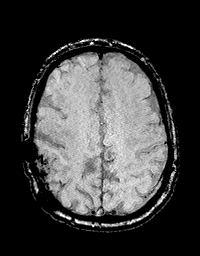
[im 104/104]
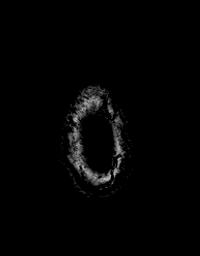

[Series 7: mip_images(sw) · axial · 12.0mm · 0.90mm/px · z∈[-87,+57]mm · 4 of 97 slices shown]
[im 1/97]
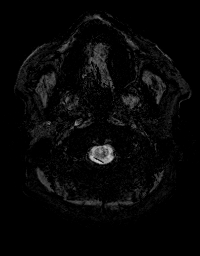
[im 33/97]
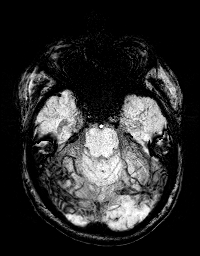
[im 65/97]
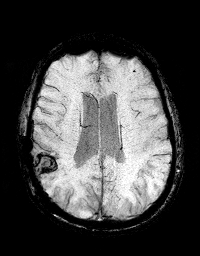
[im 97/97]
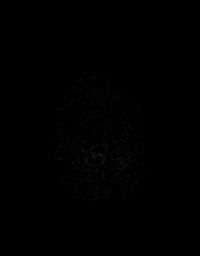

[Series 8: FLAIR · axial · 3.0mm · 0.86mm/px · z∈[-122,+70]mm · 3 of 65 slices shown (2 of 2)]
[im 1/65]
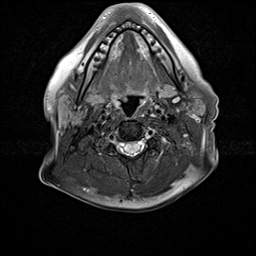
[im 33/65]
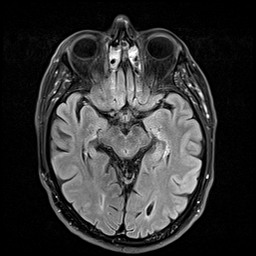
[im 65/65]
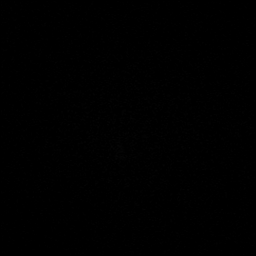

[Series 9: T2 · axial · non-contrast · 1.0mm · 0.86mm/px · z∈[-92,+64]mm · 7 of 160 slices shown (2 of 2)]
[im 1/160]
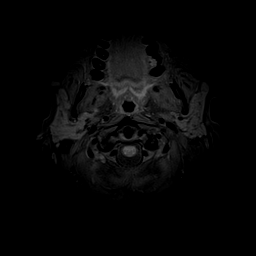
[im 27/160]
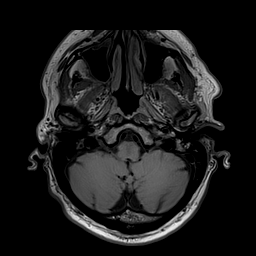
[im 54/160]
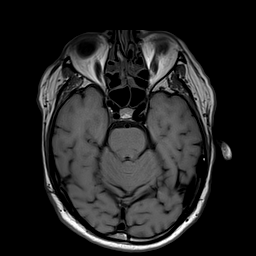
[im 80/160]
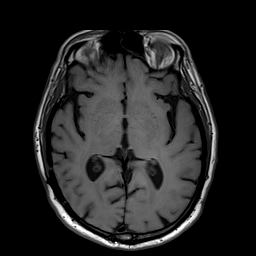
[im 107/160]
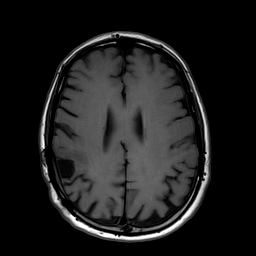
[im 133/160]
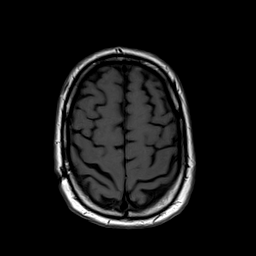
[im 160/160]
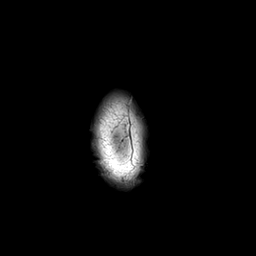

[Series 10: T2 post-contrast · coronal · 3.0mm · 0.57mm/px · 2 of 46 slices shown (1 of 2)]
[im 1/46]
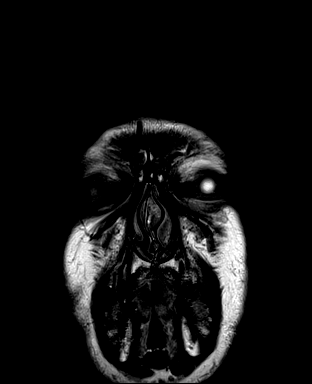
[im 46/46]
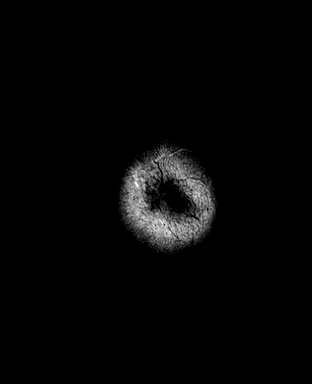

[Series 11: T2 post-contrast · axial · 1.0mm · 0.86mm/px · z∈[-92,+64]mm · 7 of 160 slices shown (2 of 2)]
[im 1/160]
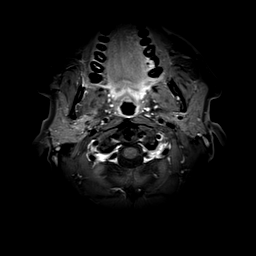
[im 27/160]
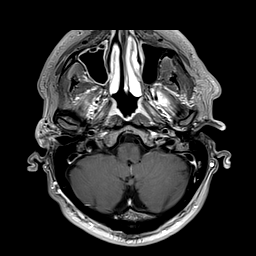
[im 54/160]
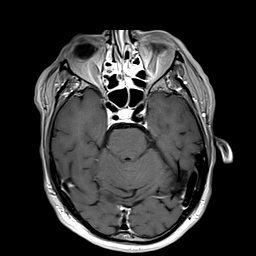
[im 80/160]
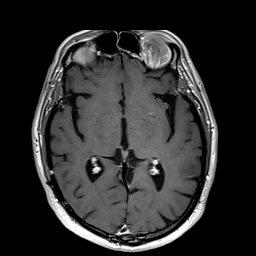
[im 107/160]
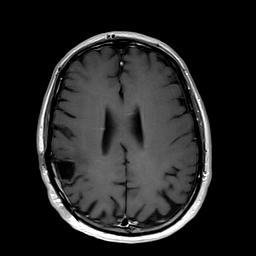
[im 133/160]
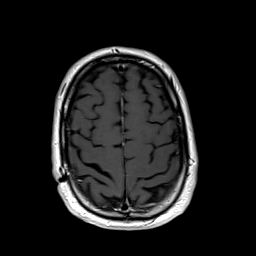
[im 160/160]
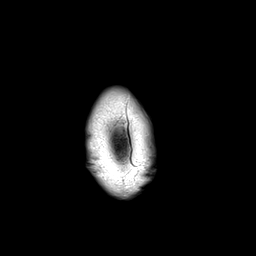

[Series 12: T1 post-contrast · axial · 1.0mm · 0.75mm/px · z∈[-94,+65]mm · 7 of 160 slices shown (1 of 2)]
[im 1/160]
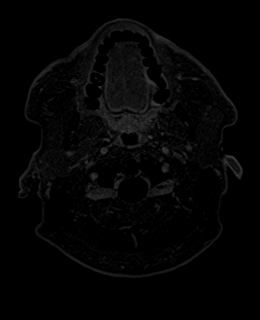
[im 27/160]
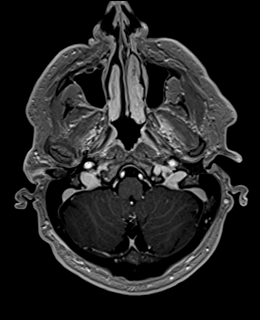
[im 54/160]
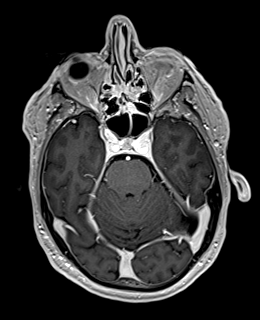
[im 80/160]
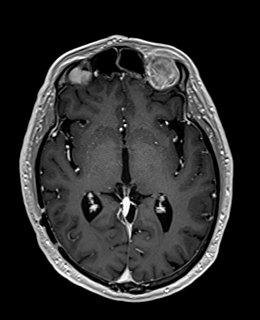
[im 107/160]
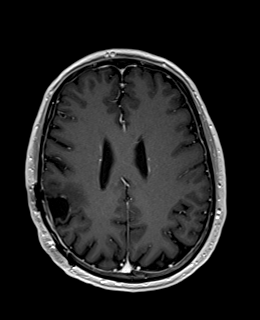
[im 133/160]
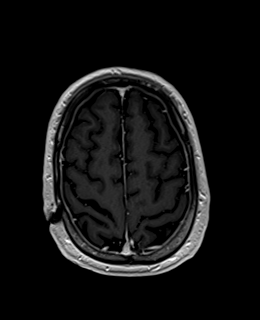
[im 160/160]
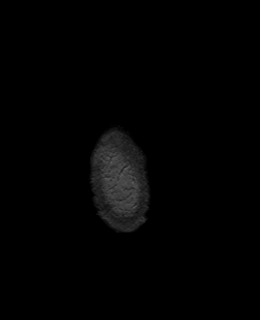

[Series 13: T1 post-contrast · coronal · 3.0mm · 0.57mm/px · 2 of 47 slices shown (2 of 2)]
[im 1/47]
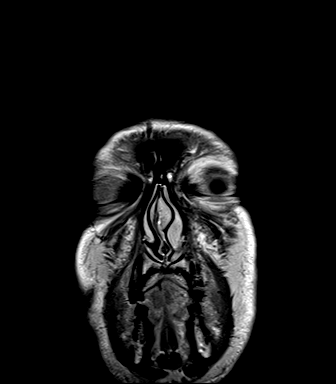
[im 47/47]
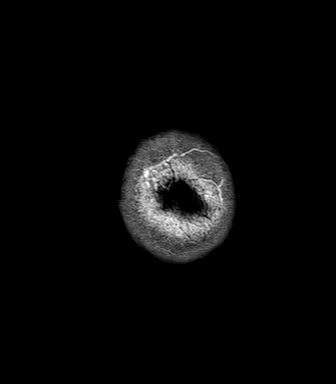

[Series 14: FLAIR post-contrast · sagittal · 3.0mm · 0.75mm/px · 2 of 39 slices shown]
[im 1/39]
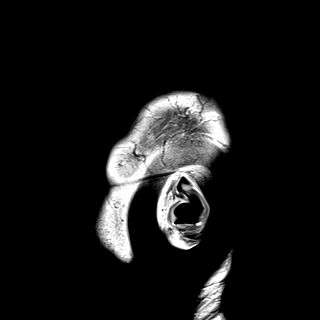
[im 39/39]
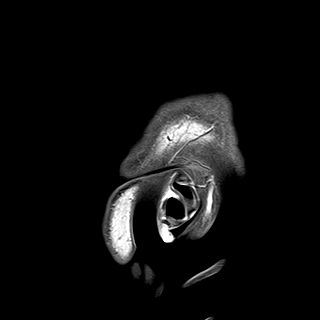

[48 of 48 positions shown; findings below may reference images not displayed]

FINDINGS: Brain: Two small nodular foci of enhancement are again noted along
the medial aspect of the right parietal resection cavity, and the
larger enhancing focus has slightly enlarged, now measuring 5 mm
(series 12, image 102, previously 3 mm). Mild T2 hyperintensity
surrounding the resection cavity in the right parietal lobe is
unchanged, and there is no mass effect.

No new enhancing brain lesions are identified. A chronic
microhemorrhage anteriorly in the left frontal lobe is unchanged.
There is no evidence of an acute infarct, midline shift, or
significant extra-axial fluid collection. There is mild chronic
dural thickening subjacent to the right parietal craniotomy. The
ventricles are normal in size.

Vascular: Major intracranial vascular flow voids are preserved.

Skull and upper cervical spine: Right parietal craniotomy. Unchanged
7 mm enhancing focus in the left parietal bone.

Sinuses/Orbits: Unremarkable orbits. Mild-to-moderate mucosal
thickening in the paranasal sinuses. Clear mastoid air cells.

Other: None.
IMPRESSION: 1. Minimally increased size of a 5 mm enhancing focus along the
origin of the right parietal resection cavity which remains
indeterminate.
2. No evidence of new intracranial metastases.

## 2022-02-02 MED ORDER — GADOBENATE DIMEGLUMINE 529 MG/ML IV SOLN
15.0000 mL | Freq: Once | INTRAVENOUS | Status: AC | PRN
  Administered 2022-02-02: 15 mL via INTRAVENOUS

## 2022-02-05 ENCOUNTER — Other Ambulatory Visit: Payer: Self-pay

## 2022-02-05 ENCOUNTER — Inpatient Hospital Stay: Payer: Medicare Other | Attending: Oncology | Admitting: Internal Medicine

## 2022-02-05 ENCOUNTER — Inpatient Hospital Stay: Payer: Medicare Other

## 2022-02-05 VITALS — BP 137/82 | HR 57 | Temp 97.2°F | Resp 16 | Wt 165.5 lb

## 2022-02-05 DIAGNOSIS — Z85820 Personal history of malignant melanoma of skin: Secondary | ICD-10-CM | POA: Diagnosis not present

## 2022-02-05 DIAGNOSIS — C7931 Secondary malignant neoplasm of brain: Secondary | ICD-10-CM | POA: Insufficient documentation

## 2022-02-05 NOTE — Progress Notes (Signed)
[facility] [place], [place] [address] [phone]   Interval Evaluation  Date of Service: 02/05/22 Patient Name: Walter Rodriguez Patient MRN: [id number] Patient DOB: Jun 09, 1956 Provider: Ventura Sellers, MD  Identifying Statement:  Walter Rodriguez is a 66 y.o. male with Metastasis to brain [facility]) [C79.31]   Primary Cancer:  Oncologic History: Oncology History  Melanoma of skin ([place])  09/07/2020 Initial Diagnosis   Melanoma of skin ([place])   09/22/2020 -  Chemotherapy      Patient is on Antibody Plan: LUNG NIVOLUMAB Q28D     07/28/2021 Cancer Staging   Staging form: Melanoma of the Skin, AJCC 8th Edition - Clinical: Stage IV (cTX, cNX, pM1a(1)) - Signed by Wyatt Portela, MD on 07/28/2021    Metastasis to brain [facility]) (Resolved)  08/05/2020 Surgery   Craniotomy, resection of 4.7cm R parietal mass with Dr. Marcello Moores; path c/w melanoma    - 08/31/2020 Radiation Therapy   Completes 27Gy/17fx to resection cavity with Dr. Lisbeth Renshaw     Interval History:  Walter Rodriguez presents today for follow up after recent MRI brain.  Denies new or progressive neurologic deficits, no return of left sided weakness.  Continues on surveillance with Dr. Alen Blew.  Cycling or walking at least 5 miles per day.    H+P (11/29/20) Patient presented to clinical attention in August 2021 with several weeks history of left arm and leg weakness.  He experienced a fall at home and was unable to get up, which prompted evaluation and CNS imaging.  MRI demonstrated large enhancing mass within right parietal lobe consistent with metastatic disease.  He underwent craniotomy and gross resection with Dr. Marcello Moores on 08/05/20, which was followed by post-op SRS in 3 fractions with Dr. Lisbeth Renshaw, completed on 08/31/20.  He has completed decadron taper.  At present time he has no motor complaints and no subjective neurologic deficits.  Is undergoing rx with Nivolumab with Dr. Alen Blew.   Very active at home, walks and cycles every day.  Medications: Current Outpatient Medications on File Prior to Visit  Medication Sig Dispense Refill   COVID-19 mRNA bivalent vaccine, Pfizer, (PFIZER COVID-19 VAC BIVALENT) injection Inject into the muscle. 0.3 mL 0   Multiple Vitamin (MULTI-VITAMIN) tablet Take 1 tablet by mouth daily.     No current facility-administered medications on file prior to visit.    Allergies: No Known Allergies Past Medical History:  Past Medical History:  Diagnosis Date   Melanoma ([place])    back   Open wound    back   Past Surgical History:  Past Surgical History:  Procedure Laterality Date   APPLICATION OF CRANIAL NAVIGATION N/A 08/05/2020   Procedure: APPLICATION OF CRANIAL NAVIGATION;  Surgeon: Vallarie Mare, MD;  Location: [facility];  Service: Neurosurgery;  Laterality: N/A;   CRANIOTOMY N/A 08/05/2020   Procedure: CRANIOTOMY TUMOR EXCISION;  Surgeon: Vallarie Mare, MD;  Location: [facility];  Service: Neurosurgery;  Laterality: N/A;   MELANOMA EXCISION WITH SENTINEL LYMPH NODE BIOPSY N/A 04/01/2019   Procedure: WIDE LOCAL EXCISION OF BACK MELANOMA WITH RIGHT AXILLARY SENTINEL LYMPH NODE BIOPSY;  Surgeon: Stark Klein, MD;  Location: [facility];  Service: General;  Laterality: N/A;   SKIN SPLIT GRAFT N/A 04/10/2019   Procedure: SPLIT THICKNESS SKIN GRAFT FROM RIGHT  THIGH TO THE BACK;  Surgeon: Irene Limbo, MD;  Location: [facility];  Service: Plastics;  Laterality: N/A;   Social History:  Social  History   Socioeconomic History   Marital status: Single    Spouse name: Not on file   Number of children: Not on file   Years of education: Not on file   Highest education level: Not on file  Occupational History   Not on file  Tobacco Use   Smoking status: Never   Smokeless tobacco: Never  Vaping Use   Vaping Use: Never used  Substance and Sexual Activity   Alcohol use: Never   Drug use: Never   Sexual activity: Not on file  Other Topics Concern    Not on file  Social History Narrative   Not on file   Social Determinants of Health   Financial Resource Strain: Not on file  Food Insecurity: Not on file  Transportation Needs: Not on file  Physical Activity: Not on file  Stress: Not on file  Social Connections: Not on file  Intimate Partner Violence: Not on file   Family History: No family history on file.  Review of Systems: Constitutional: Doesn't report fevers, chills or abnormal weight loss Eyes: Doesn't report blurriness of vision Ears, nose, mouth, throat, and face: Doesn't report sore throat Respiratory: Doesn't report cough, dyspnea or wheezes Cardiovascular: Doesn't report palpitation, chest discomfort  Gastrointestinal:  Doesn't report nausea, constipation, diarrhea GU: Doesn't report incontinence Skin: Doesn't report skin rashes Neurological: Per HPI Musculoskeletal: Doesn't report joint pain Behavioral/Psych: Doesn't report anxiety  Physical Exam: Vitals:   02/05/22 0840  BP: 137/82  Pulse: (!) 57  Resp: 16  Temp: (!) 97.2 F (36.2 C)  SpO2: 100%    KPS: 90. General: Alert, cooperative, pleasant, in no acute distress Head: Normal EENT: No conjunctival injection or scleral icterus.  Lungs: Resp effort normal Cardiac: Regular rate Abdomen: Non-distended abdomen Skin: No rashes cyanosis or petechiae. Extremities: No clubbing or edema  Neurologic Exam: Mental Status: Awake, alert, attentive to examiner. Oriented to self and environment. Language is fluent with intact comprehension.  Cranial Nerves: Visual acuity is grossly normal. Visual fields are full. Extra-ocular movements intact. No ptosis. Face is symmetric Motor: Tone and bulk are normal. Power is full in both arms and legs. Reflexes are symmetric, no pathologic reflexes present.  Sensory: Intact to light touch Gait: Normal.   Labs: I have reviewed the data as listed    Component Value Date/Time   NA 140 11/21/2021 0954   K 4.4  11/21/2021 0954   CL 106 11/21/2021 0954   CO2 25 11/21/2021 0954   GLUCOSE 89 11/21/2021 0954   BUN 17 11/21/2021 0954   CREATININE 1.08 11/21/2021 0954   CREATININE 1.19 07/27/2020 0836   CALCIUM 9.2 11/21/2021 0954   PROT 7.0 11/21/2021 0954   ALBUMIN 4.1 11/21/2021 0954   AST 19 11/21/2021 0954   ALT 18 11/21/2021 0954   ALKPHOS 53 11/21/2021 0954   BILITOT 0.5 11/21/2021 0954   GFRNONAA >60 11/21/2021 0954   GFRNONAA 63 07/27/2020 0836   GFRAA >60 08/07/2020 0429   GFRAA 73 07/27/2020 0836   Lab Results  Component Value Date   WBC 6.3 11/21/2021   NEUTROABS 4.0 11/21/2021   HGB 14.8 11/21/2021   HCT 45.2 11/21/2021   MCV 85.0 11/21/2021   PLT 197 11/21/2021    Imaging:  MR BRAIN W WO CONTRAST  Result Date: 02/03/2022 CLINICAL DATA:  Brain/CNS neoplasm, assess treatment response. Metastatic melanoma. EXAM: MRI HEAD WITHOUT AND WITH CONTRAST TECHNIQUE: Multiplanar, multiecho pulse sequences of the brain and surrounding structures were obtained without and  with intravenous contrast. CONTRAST:  70mL MULTIHANCE GADOBENATE DIMEGLUMINE 529 MG/ML IV SOLN COMPARISON:  Head MRI 10/06/2021 FINDINGS: Brain: Two small nodular foci of enhancement are again noted along the medial aspect of the right parietal resection cavity, and the larger enhancing focus has slightly enlarged, now measuring 5 mm (series 12, image 102, previously 3 mm). Mild T2 hyperintensity surrounding the resection cavity in the right parietal lobe is unchanged, and there is no mass effect. No new enhancing brain lesions are identified. A chronic microhemorrhage anteriorly in the left frontal lobe is unchanged. There is no evidence of an acute infarct, midline shift, or significant extra-axial fluid collection. There is mild chronic dural thickening subjacent to the right parietal craniotomy. The ventricles are normal in size. Vascular: Major intracranial vascular flow voids are preserved. Skull and upper cervical spine:  Right parietal craniotomy. Unchanged 7 mm enhancing focus in the left parietal bone. Sinuses/Orbits: Unremarkable orbits. Mild-to-moderate mucosal thickening in the paranasal sinuses. Clear mastoid air cells. Other: None. IMPRESSION: 1. Minimally increased size of a 5 mm enhancing focus along the origin of the right parietal resection cavity which remains indeterminate. 2. No evidence of new intracranial metastases. Electronically Signed   By: Logan Bores M.D.   On: 02/03/2022 11:26     [facility] Clinician Interpretation: I have personally reviewed the radiological images as listed.  My interpretation, in the context of the patient's clinical presentation, is treatment effect vs true progression   Assessment/Plan Brain Metastasis  Walter Rodriguez is clinically stable today.  MRI continues to demonstrate mild progression of foci of enhancement adjacent to the resection cavity.  Suspect etiology is blood brain barrier disruption from radiation treatment effect, vs actual tumor progression.  We again encouraged him to remain active and continue aerobic exercise regimen.  We spent twenty additional minutes teaching regarding the natural history, biology, and historical experience in the treatment of neurologic complications of cancer.   We appreciate the opportunity to participate in the care of Walter Rodriguez.   We ask that Walter Rodriguez return to clinic in 4 months following next brain MRI, or sooner as needed.  All questions were answered. The patient knows to call the clinic with any problems, questions or concerns. No barriers to learning were detected.  The total time spent in the encounter was 30 minutes and more than 50% was on counseling and review of test results   Ventura Sellers, MD Medical Director of Neuro-Oncology [facility] [place] 02/05/22 8:47 AM

## 2022-02-06 ENCOUNTER — Telehealth: Payer: Self-pay | Admitting: Internal Medicine

## 2022-02-06 NOTE — Telephone Encounter (Signed)
Scheduled per 2/27 los, pt has been called and confirmed  °

## 2022-04-30 DIAGNOSIS — D2230 Melanocytic nevi of unspecified part of face: Secondary | ICD-10-CM | POA: Diagnosis not present

## 2022-04-30 DIAGNOSIS — L578 Other skin changes due to chronic exposure to nonionizing radiation: Secondary | ICD-10-CM | POA: Diagnosis not present

## 2022-04-30 DIAGNOSIS — C7931 Secondary malignant neoplasm of brain: Secondary | ICD-10-CM | POA: Diagnosis not present

## 2022-04-30 DIAGNOSIS — R233 Spontaneous ecchymoses: Secondary | ICD-10-CM | POA: Diagnosis not present

## 2022-04-30 DIAGNOSIS — D229 Melanocytic nevi, unspecified: Secondary | ICD-10-CM | POA: Diagnosis not present

## 2022-05-10 ENCOUNTER — Other Ambulatory Visit: Payer: Medicare Other

## 2022-05-15 ENCOUNTER — Other Ambulatory Visit: Payer: Self-pay | Admitting: Nurse Practitioner

## 2022-05-22 ENCOUNTER — Other Ambulatory Visit: Payer: Self-pay

## 2022-05-22 ENCOUNTER — Ambulatory Visit (HOSPITAL_COMMUNITY): Payer: Medicare Other

## 2022-05-22 ENCOUNTER — Inpatient Hospital Stay: Payer: Medicare Other | Attending: Oncology

## 2022-05-22 ENCOUNTER — Encounter (HOSPITAL_COMMUNITY): Payer: Self-pay

## 2022-05-22 ENCOUNTER — Ambulatory Visit (HOSPITAL_COMMUNITY)
Admission: RE | Admit: 2022-05-22 | Discharge: 2022-05-22 | Disposition: A | Discharge status: Home / Self Care | Payer: Medicare Other | Source: Ambulatory Visit | Attending: Oncology | Admitting: Oncology

## 2022-05-22 ENCOUNTER — Other Ambulatory Visit: Payer: Self-pay | Admitting: Lab

## 2022-05-22 DIAGNOSIS — E041 Nontoxic single thyroid nodule: Secondary | ICD-10-CM | POA: Insufficient documentation

## 2022-05-22 DIAGNOSIS — N281 Cyst of kidney, acquired: Secondary | ICD-10-CM | POA: Diagnosis not present

## 2022-05-22 DIAGNOSIS — I700 Atherosclerosis of aorta: Secondary | ICD-10-CM | POA: Insufficient documentation

## 2022-05-22 DIAGNOSIS — C439 Malignant melanoma of skin, unspecified: Secondary | ICD-10-CM

## 2022-05-22 DIAGNOSIS — K769 Liver disease, unspecified: Secondary | ICD-10-CM | POA: Insufficient documentation

## 2022-05-22 DIAGNOSIS — Z923 Personal history of irradiation: Secondary | ICD-10-CM | POA: Insufficient documentation

## 2022-05-22 DIAGNOSIS — C7931 Secondary malignant neoplasm of brain: Secondary | ICD-10-CM | POA: Insufficient documentation

## 2022-05-22 DIAGNOSIS — N400 Enlarged prostate without lower urinary tract symptoms: Secondary | ICD-10-CM | POA: Insufficient documentation

## 2022-05-22 DIAGNOSIS — C4359 Malignant melanoma of other part of trunk: Secondary | ICD-10-CM | POA: Insufficient documentation

## 2022-05-22 DIAGNOSIS — Z9221 Personal history of antineoplastic chemotherapy: Secondary | ICD-10-CM | POA: Insufficient documentation

## 2022-05-22 DIAGNOSIS — K5730 Diverticulosis of large intestine without perforation or abscess without bleeding: Secondary | ICD-10-CM | POA: Diagnosis not present

## 2022-05-22 DIAGNOSIS — R234 Changes in skin texture: Secondary | ICD-10-CM | POA: Diagnosis not present

## 2022-05-22 DIAGNOSIS — M47812 Spondylosis without myelopathy or radiculopathy, cervical region: Secondary | ICD-10-CM | POA: Diagnosis not present

## 2022-05-22 DIAGNOSIS — R590 Localized enlarged lymph nodes: Secondary | ICD-10-CM | POA: Diagnosis not present

## 2022-05-22 LAB — CMP (CANCER CENTER ONLY)
ALT: 14 U/L (ref 0–44)
AST: 16 U/L (ref 15–41)
Albumin: 4.3 g/dL (ref 3.5–5.0)
Alkaline Phosphatase: 49 U/L (ref 38–126)
Anion gap: 6 (ref 5–15)
BUN: 19 mg/dL (ref 8–23)
CO2: 29 mmol/L (ref 22–32)
Calcium: 9.7 mg/dL (ref 8.9–10.3)
Chloride: 105 mmol/L (ref 98–111)
Creatinine: 1.2 mg/dL (ref 0.61–1.24)
GFR, Estimated: >60 mL/min (ref >60)
Glucose, Bld: 99 mg/dL (ref 70–99)
Potassium: 4.3 mmol/L (ref 3.5–5.1)
Sodium: 140 mmol/L (ref 135–145)
Total Bilirubin: 0.5 mg/dL (ref 0.3–1.2)
Total Protein: 7.1 g/dL (ref 6.5–8.1)

## 2022-05-22 LAB — CBC WITH DIFFERENTIAL (CANCER CENTER ONLY)
Abs Immature Granulocytes: 0.02 10*3/uL (ref 0.00–0.07)
Basophils Absolute: 0.1 10*3/uL (ref 0.0–0.1)
Basophils Relative: 1 %
Eosinophils Absolute: 0.3 10*3/uL (ref 0.0–0.5)
Eosinophils Relative: 4 %
HCT: 44.1 % (ref 39.0–52.0)
Hemoglobin: 14.9 g/dL (ref 13.0–17.0)
Immature Granulocytes: 0 %
Lymphocytes Relative: 27 %
Lymphs Abs: 2.2 10*3/uL (ref 0.7–4.0)
MCH: 28.7 pg (ref 26.0–34.0)
MCHC: 33.8 g/dL (ref 30.0–36.0)
MCV: 85 fL (ref 80.0–100.0)
Monocytes Absolute: 0.5 10*3/uL (ref 0.1–1.0)
Monocytes Relative: 6 %
Neutro Abs: 5.1 10*3/uL (ref 1.7–7.7)
Neutrophils Relative %: 62 %
Platelet Count: 217 10*3/uL (ref 150–400)
RBC: 5.19 MIL/uL (ref 4.22–5.81)
RDW: 13.1 % (ref 11.5–15.5)
WBC Count: 8.3 10*3/uL (ref 4.0–10.5)
nRBC: 0 % (ref 0.0–0.2)

## 2022-05-22 LAB — TSH: TSH: 3.918 u[IU]/mL (ref 0.350–4.500)

## 2022-05-22 IMAGING — CT CT CHEST-ABD-PELV W/ CM
3 of 5 series · 14 of 36 positions shown, 16 images · IV contrast (APPLIED)
Comparison: CT chest abdomen pelvis, [DATE], MR abdomen,
[DATE]

CLINICAL DATA: Metastatic melanoma, monitor, immunotherapy and XRT
complete * Tracking Code: BO *

EXAM:
CT CHEST, ABDOMEN, AND PELVIS WITH CONTRAST
TECHNIQUE: Multidetector CT imaging of the chest, abdomen and pelvis was
performed following the standard protocol during bolus
administration of intravenous contrast.

[Series 2: cap with · axial · 0.98mm/px · z∈[-811,-271]mm · 9 of 136 slices shown, 11 images]
[im 14/136  mediastinal]
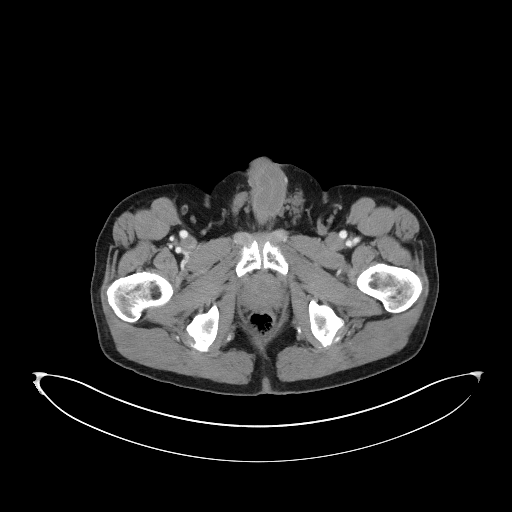
[im 14/136  bone]
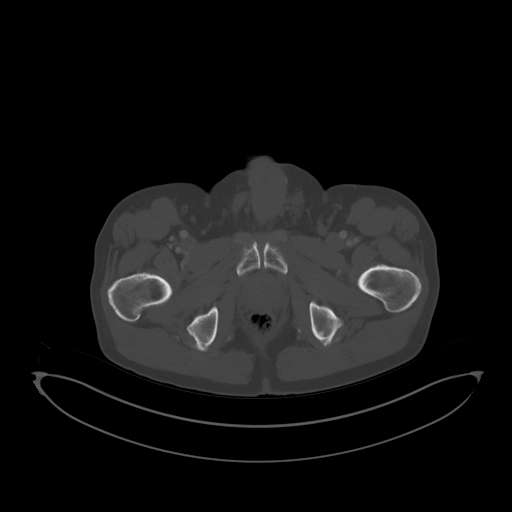
[im 28/136  mediastinal]
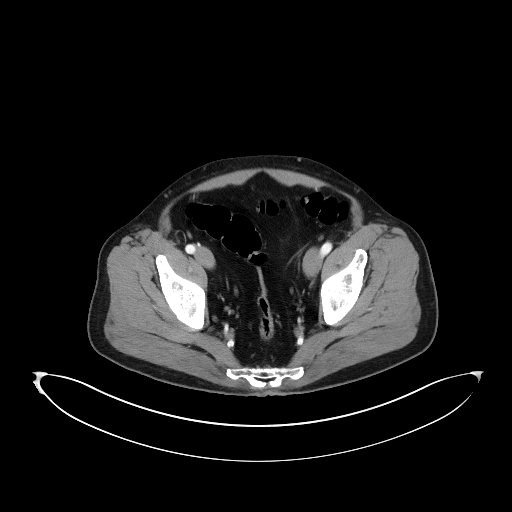
[im 41/136  mediastinal]
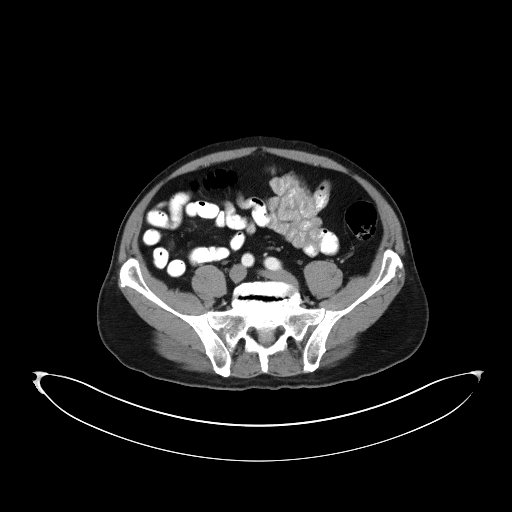
[im 55/136  mediastinal]
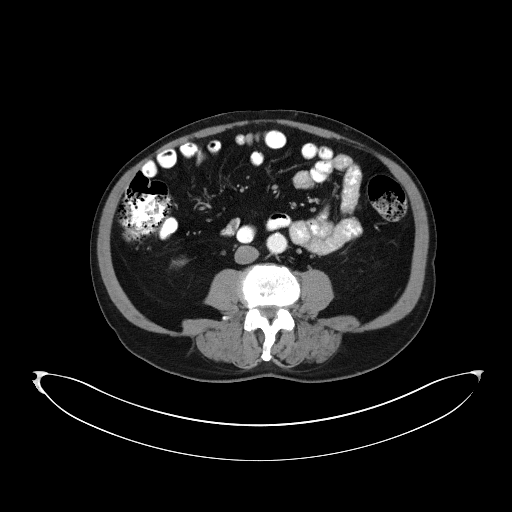
[im 68/136  mediastinal]
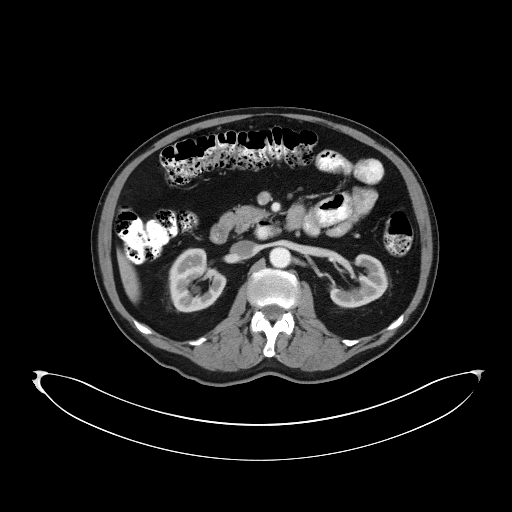
[im 82/136  mediastinal]
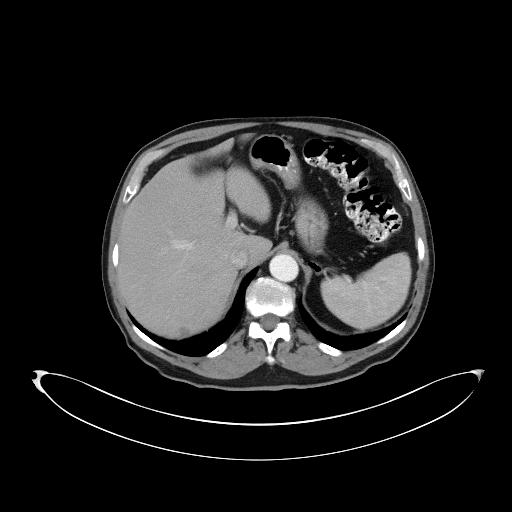
[im 95/136  mediastinal]
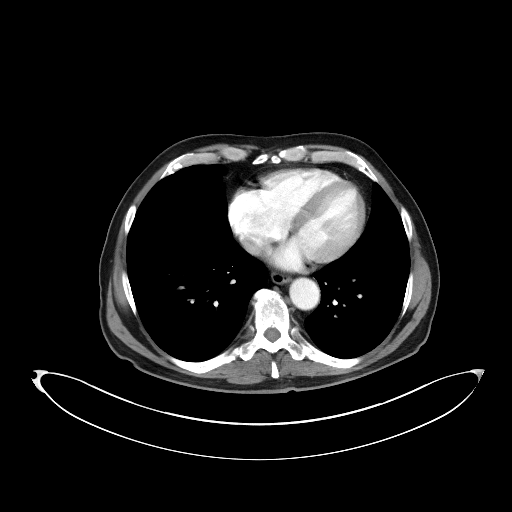
[im 109/136  mediastinal]
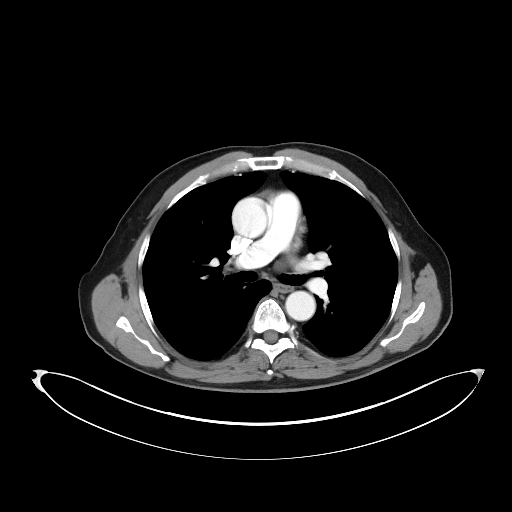
[im 122/136  mediastinal]
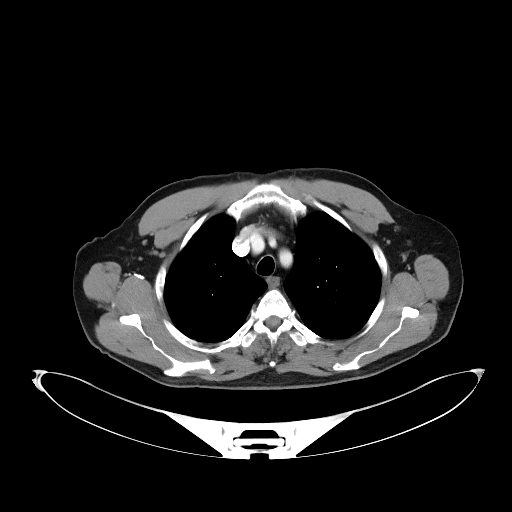
[im 122/136  bone]
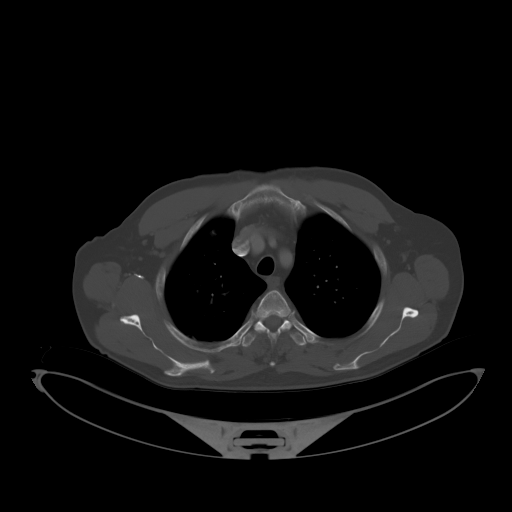

[Series 4: lung · axial · 0.70mm/px · z∈[-511,-459]mm · 2 of 168 slices shown]
[im 13/168  bone]
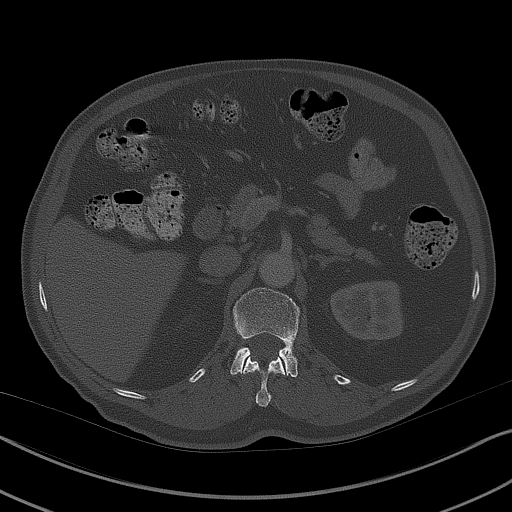
[im 39/168  bone]
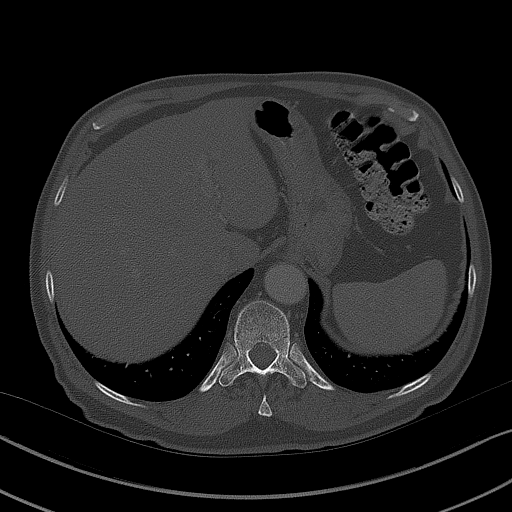

[Series 5: coronals · coronal · 0.82mm/px · 3 of 147 slices shown]
[im 30/147  mediastinal]
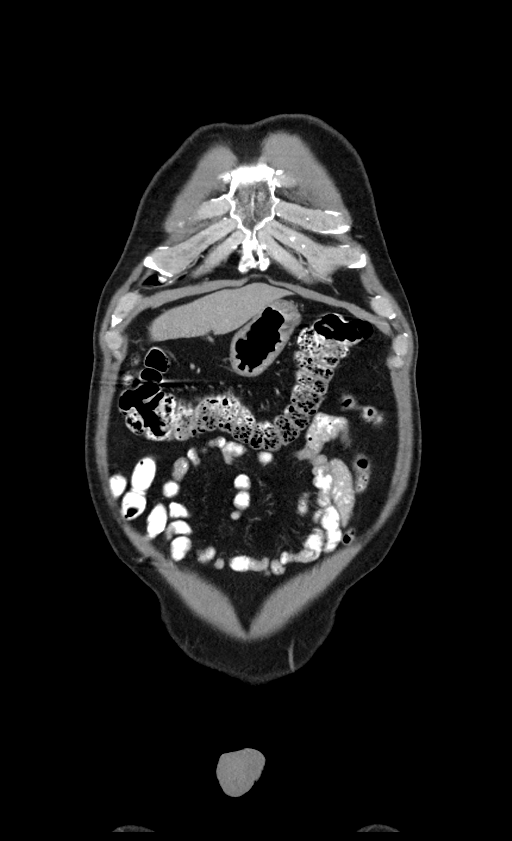
[im 59/147  mediastinal]
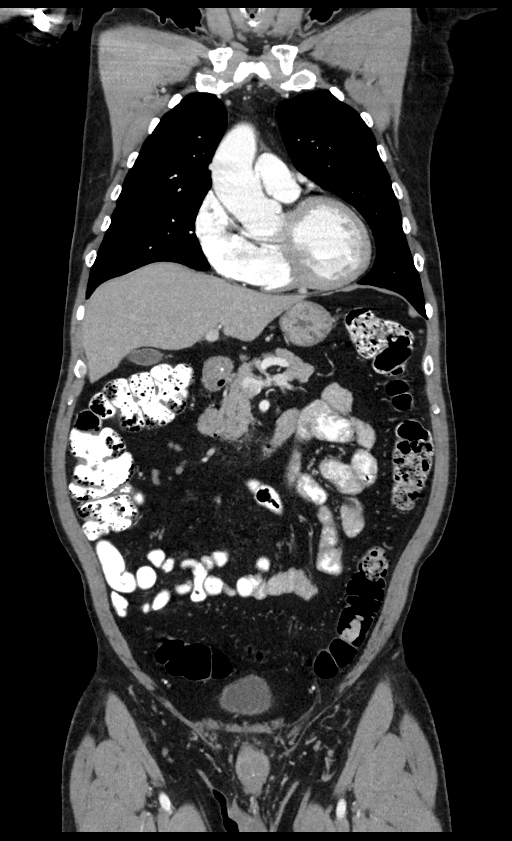
[im 88/147  mediastinal]
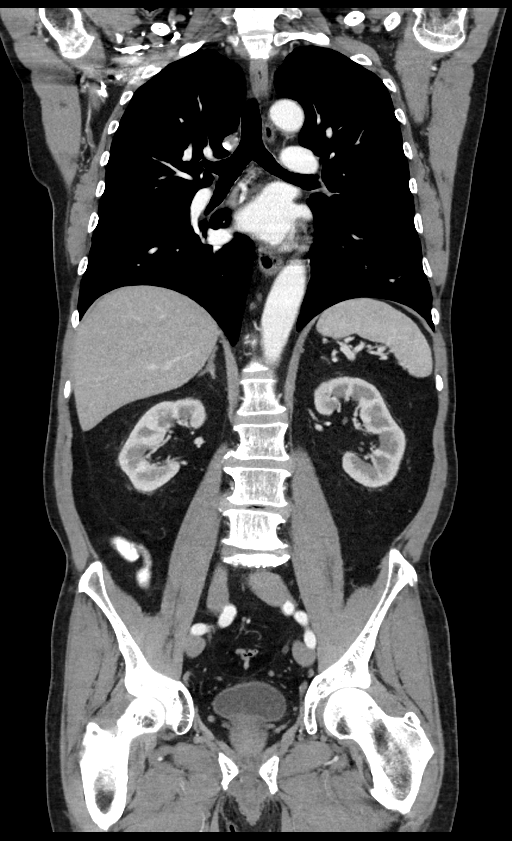

[14 of 36 positions shown; findings below may reference images not displayed]

RADIATION DOSE REDUCTION: This exam was performed according to the
departmental dose-optimization program which includes automated
exposure control, adjustment of the mA and/or kV according to
patient size and/or use of iterative reconstruction technique.

CONTRAST:  100mL OMNIPAQUE IOHEXOL 300 MG/ML SOLN additional oral
enteric contrast
FINDINGS: CT CHEST FINDINGS

Cardiovascular: Aortic atherosclerosis. Normal heart size. No
pericardial effusion.

Mediastinum/Nodes: No enlarged mediastinal, hilar, or axillary lymph
nodes. Status post right axillary lymph node dissection (series 2,
image 17). Thyroid gland, trachea, and esophagus demonstrate no
significant findings.

Lungs/Pleura: Lungs are clear. No pleural effusion or pneumothorax.

Musculoskeletal: No chest wall mass or suspicious osseous lesions
identified.

CT ABDOMEN PELVIS FINDINGS

Hepatobiliary: No solid liver abnormality is seen. Subtle hypodense
lesion of the posterior liver dome, hepatic segment VII, is
unchanged, measuring 1.1 cm (series 2, image 55). No gallstones,
gallbladder wall thickening, or biliary dilatation.

Pancreas: Unremarkable. No pancreatic ductal dilatation or
surrounding inflammatory changes.

Spleen: Normal in size without significant abnormality.

Adrenals/Urinary Tract: Adrenal glands are unremarkable. Simple,
benign bilateral renal cortical cysts, for which no further
follow-up or characterization is required. Kidneys are otherwise
normal, without renal calculi, solid lesion, or hydronephrosis.
Bladder is unremarkable.

Stomach/Bowel: Small diverticulum of the gastric fundus (series 2,
image 55). Stomach is otherwise within normal limits. Appendix
appears normal. No evidence of bowel wall thickening, distention, or
inflammatory changes. Sigmoid diverticula.

Vascular/Lymphatic: Aortic atherosclerosis. No enlarged abdominal or
pelvic lymph nodes.

Reproductive: Mild prostatomegaly.

Other: No abdominal wall hernia or abnormality. No ascites.

Musculoskeletal: No acute osseous findings.
IMPRESSION: 1. No evidence of metastatic disease in the chest, abdomen, or
pelvis.
2. Unchanged, subtle hypodense lesion of the posterior liver dome,
hepatic segment VII, previously better characterized by MRI and
again most likely a small, benign hemangioma. Continued attention on
follow-up.
3. Mild prostatomegaly.

Aortic Atherosclerosis ([PL]-[PL]).

## 2022-05-22 IMAGING — CT CT NECK W/ CM
3 of 5 series · 11 of 33 positions shown, 13 images · IV contrast (APPLIED)
Comparison: Chest CT [DATE].  Neck CT [DATE].

CLINICAL DATA: Melanoma. Immunotherapy complete. Radiation therapy
complete. Routine follow-up.

EXAM:
CT NECK WITH CONTRAST
TECHNIQUE: Multidetector CT imaging of the neck was performed using the
standard protocol following the bolus administration of intravenous
contrast.

[Series 5: orthogonal (person_name) · axial · 0.36mm/px · z∈[-300,-145]mm · 3 of 162 slices shown, 4 images]
[im 41/162  soft-tissue]
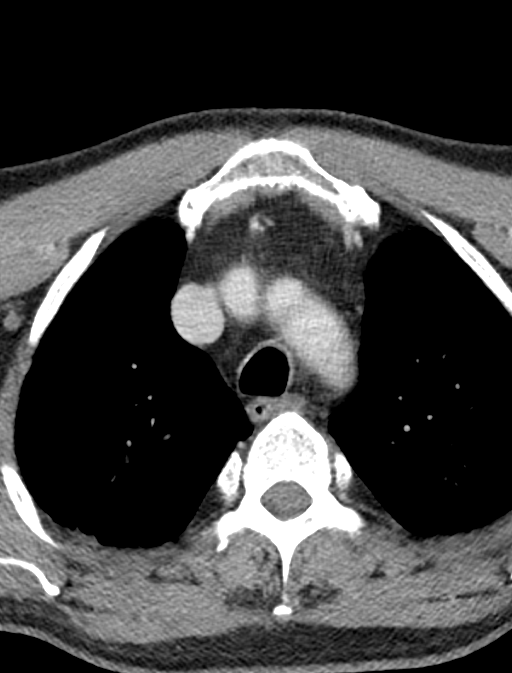
[im 41/162  bone]
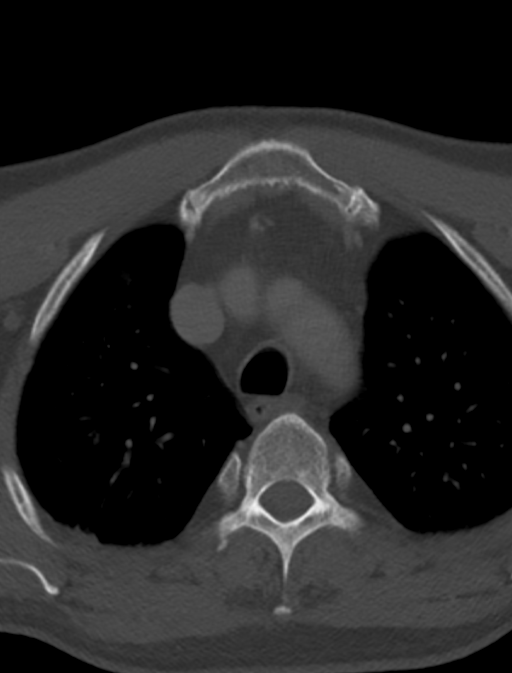
[im 81/162  bone]
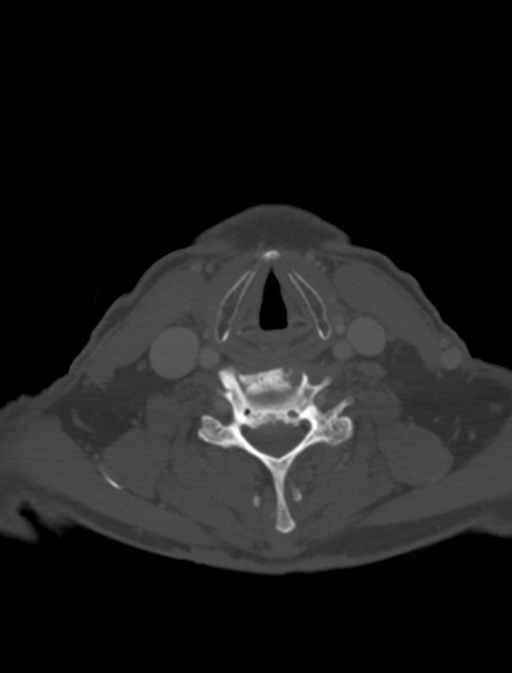
[im 121/162  bone]
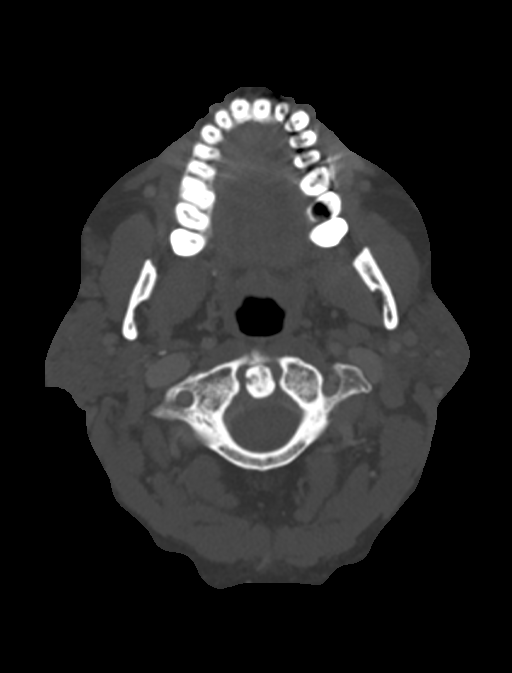

[Series 6: cor neck · coronal · 0.36mm/px · 3 of 122 slices shown]
[im 30/122  bone]
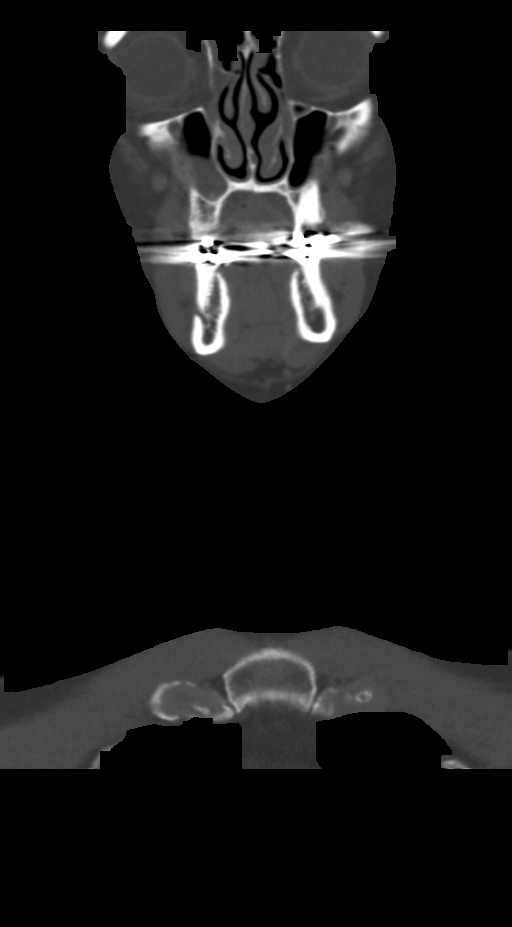
[im 51/122  bone]
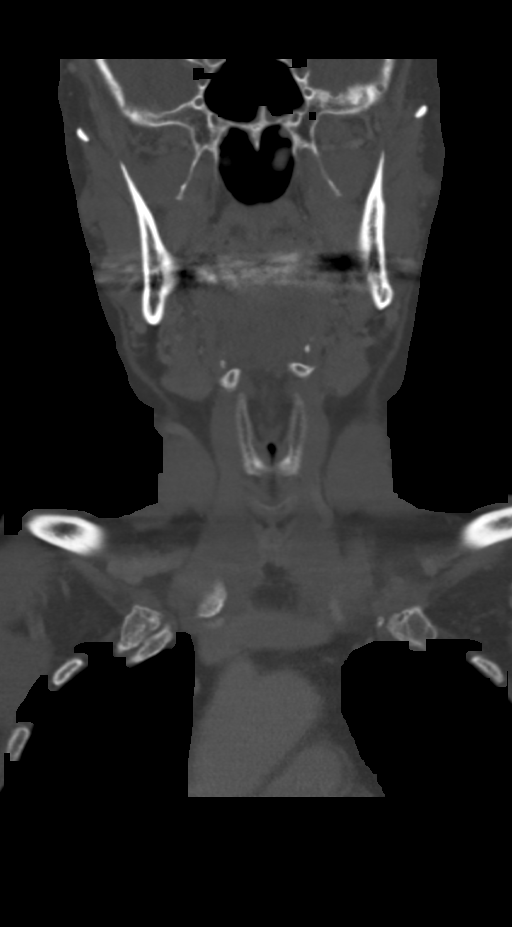
[im 72/122  bone]
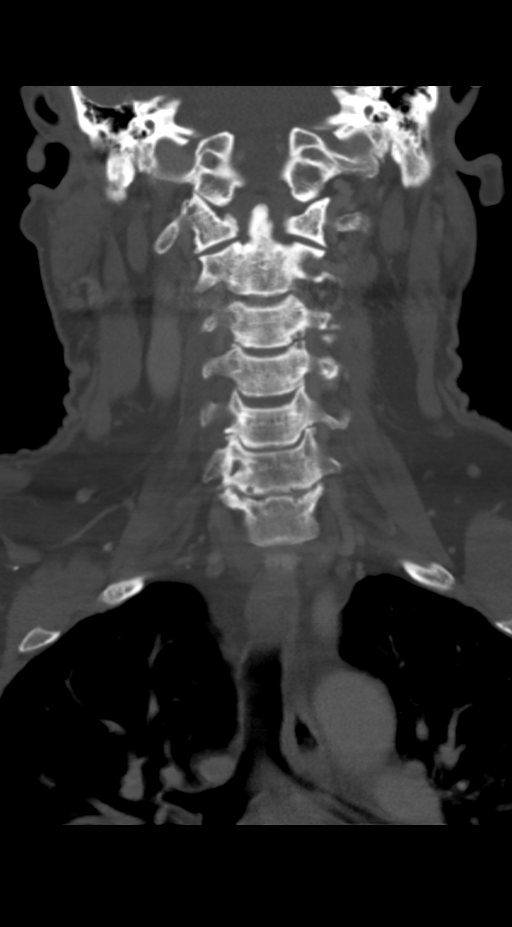

[Series 7: sag neck · sagittal · 0.47mm/px · 5 of 93 slices shown, 6 images]
[im 31/93  bone]
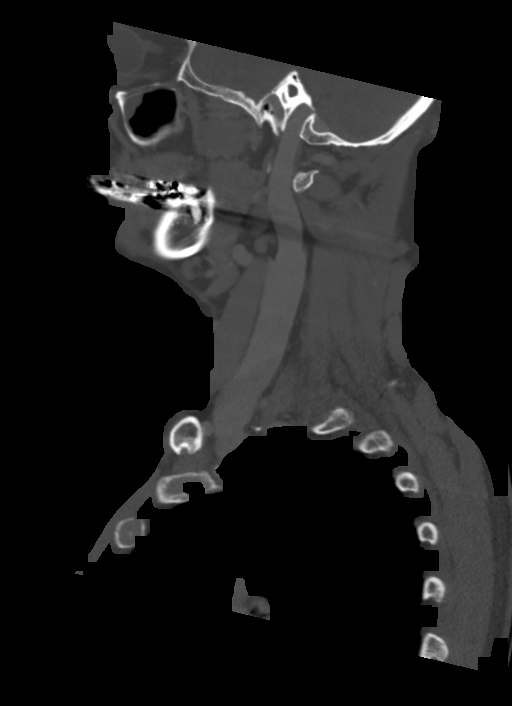
[im 39/93  bone]
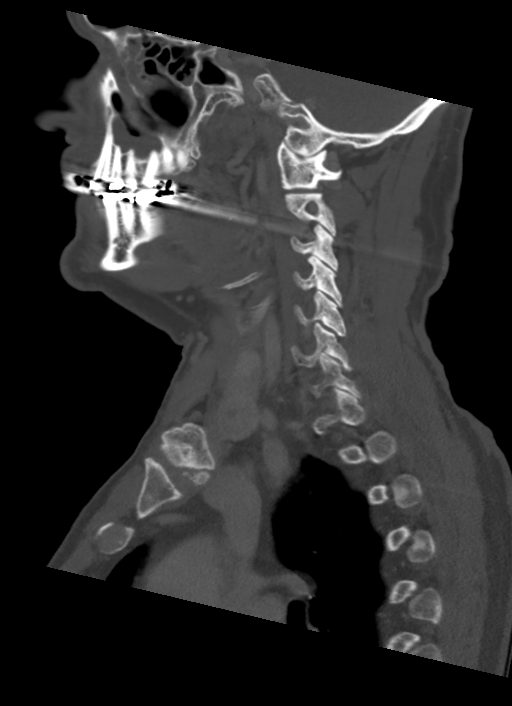
[im 47/93  soft-tissue]
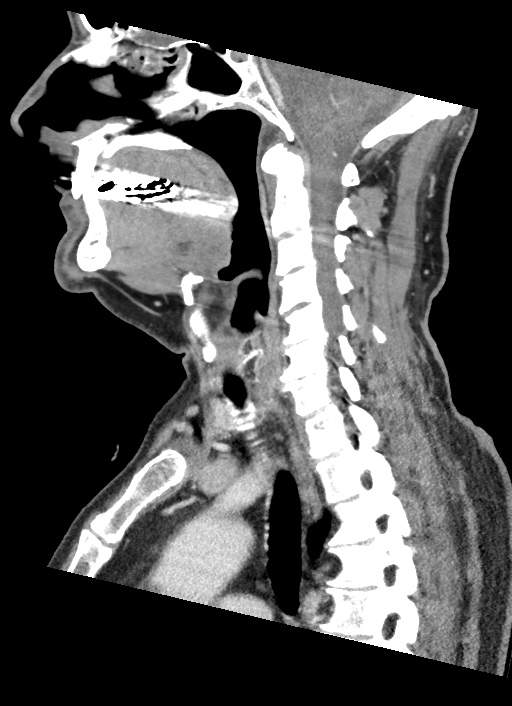
[im 47/93  bone]
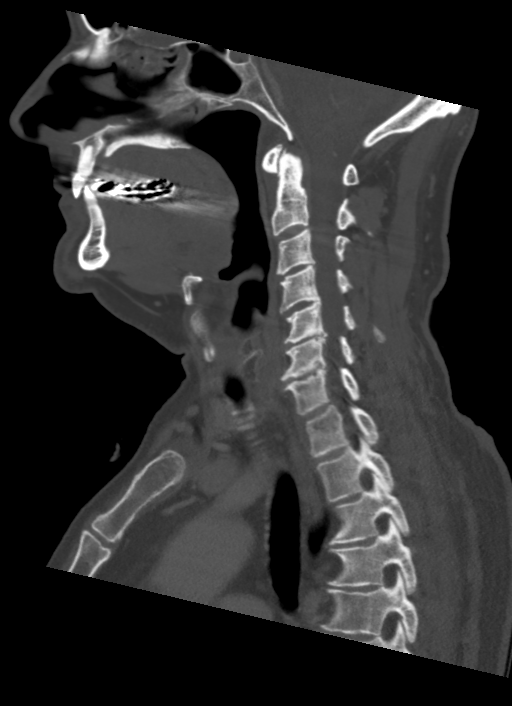
[im 54/93  bone]
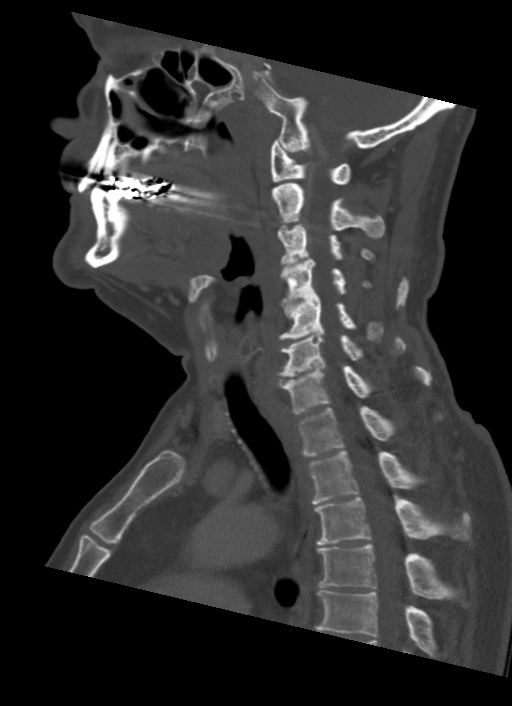
[im 62/93  bone]
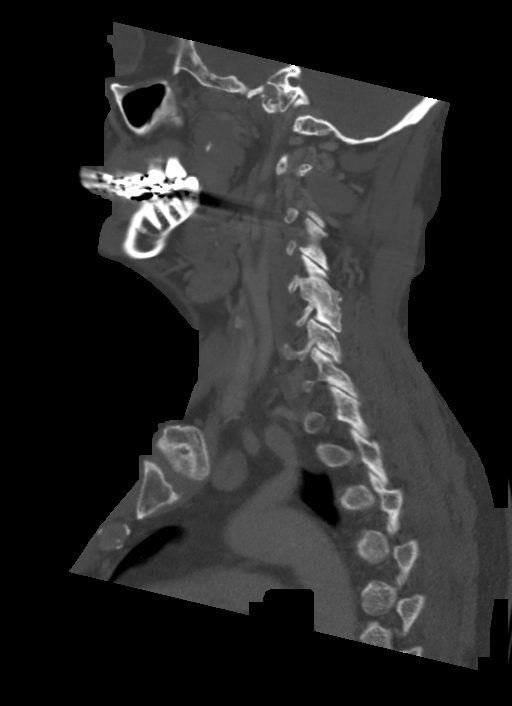

[11 of 33 positions shown; findings below may reference images not displayed]

RADIATION DOSE REDUCTION: This exam was performed according to the
departmental dose-optimization program which includes automated
exposure control, adjustment of the mA and/or kV according to
patient size and/or use of iterative reconstruction technique.

CONTRAST:  100mL OMNIPAQUE IOHEXOL 300 MG/ML  SOLN
FINDINGS: Pharynx and larynx: No mucosal or submucosal lesion.

Salivary glands: Parotid and submandibular glands are normal.

Thyroid: Enlarging thyroid nodule of the inferior aspect of the
right lobe of the thyroid, now measuring up to 2.2 cm in diameter.
Thyroid ultrasound recommended.

Lymph nodes: No lymphadenopathy on either side of the neck. Normal
bilateral cervical chain lymph nodes. Skin thickening in the region
of the initial tumor at the posterior junction of the neck and
chest.

Vascular: No abnormal vascular finding.

Limited intracranial: Normal

Visualized orbits: Normal

Mastoids and visualized paranasal sinuses: Scattered opacified
ethmoid air cells. Mucosal thickening at the right maxillary sinus
floor.

Skeleton: Ordinary cervical spondylosis. Chronic facet arthropathy
with facet fusion on the left at C4-5.

Upper chest: See results of chest CT.

Other: None
IMPRESSION: Skin thickening at the site of the previous tumor at the posterior
junction of the neck and chest. Findings consistent with post
treatment changes.

No lymphadenopathy in the neck region.

Enlarging nodule of the right lobe of the thyroid, now up to 2.2 cm
in size. Recommend thyroid US (ref: [HOSPITAL]. [DE]

## 2022-05-22 MED ORDER — IOHEXOL 300 MG/ML  SOLN
100.0000 mL | Freq: Once | INTRAMUSCULAR | Status: AC | PRN
  Administered 2022-05-22: 100 mL via INTRAVENOUS

## 2022-05-25 ENCOUNTER — Ambulatory Visit
Admission: RE | Admit: 2022-05-25 | Discharge: 2022-05-25 | Disposition: A | Discharge status: Home / Self Care | Payer: Medicare Other | Source: Ambulatory Visit | Attending: Internal Medicine | Admitting: Internal Medicine

## 2022-05-25 DIAGNOSIS — Z85841 Personal history of malignant neoplasm of brain: Secondary | ICD-10-CM | POA: Diagnosis not present

## 2022-05-25 DIAGNOSIS — C7931 Secondary malignant neoplasm of brain: Secondary | ICD-10-CM

## 2022-05-25 DIAGNOSIS — I6782 Cerebral ischemia: Secondary | ICD-10-CM | POA: Diagnosis not present

## 2022-05-25 DIAGNOSIS — I611 Nontraumatic intracerebral hemorrhage in hemisphere, cortical: Secondary | ICD-10-CM | POA: Diagnosis not present

## 2022-05-25 DIAGNOSIS — J329 Chronic sinusitis, unspecified: Secondary | ICD-10-CM | POA: Diagnosis not present

## 2022-05-25 IMAGING — MR MR HEAD WO/W CM
12 series · 48 of 48 positions shown · IV contrast (multihance)
Comparison: Prior brain MRI examinations [DATE] and earlier.

CLINICAL DATA: Provided history: Brain/CNS neoplasm, assess
treatment response.

EXAM:
MRI HEAD WITHOUT AND WITH CONTRAST
TECHNIQUE: Multiplanar, multiecho pulse sequences of the brain and surrounding
structures were obtained without and with intravenous contrast.
CONTRAST:  16mL MULTIHANCE GADOBENATE DIMEGLUMINE 529 MG/ML IV SOLN

[Series 2: FLAIR · sagittal · 3.0mm · 0.75mm/px · 3 of 39 slices shown (1 of 2)]
[im 1/39]
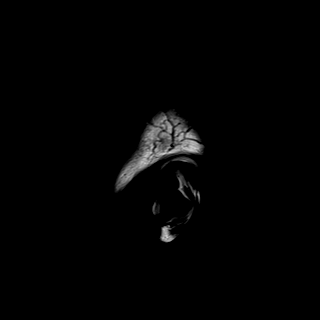
[im 20/39]
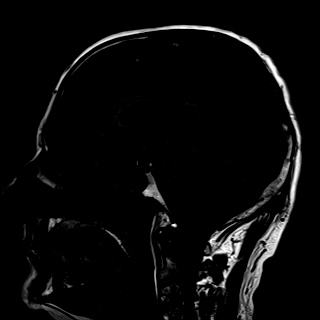
[im 39/39]
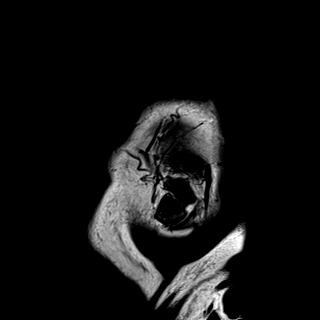

[Series 3: DWI · axial · 3.0mm · 1.50mm/px · z∈[-76,+72]mm · 4 of 78 slices shown (1 of 2)]
[im 1/78]
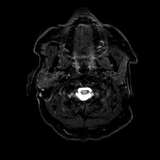
[im 26/78]
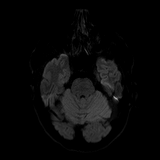
[im 52/78]
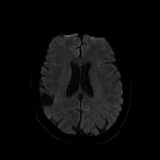
[im 78/78]
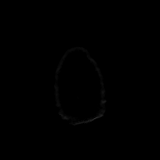

[Series 4: DWI · axial · 3.0mm · 1.50mm/px · z∈[-76,+72]mm · 2 of 38 slices shown (2 of 2)]
[im 1/38]
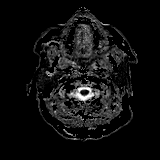
[im 38/38]
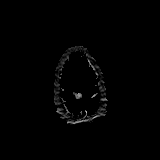

[Series 5: T2 · axial · 5.0mm · 0.57mm/px · 1 of 27 slices shown (1 of 2)]
[im 1/27]
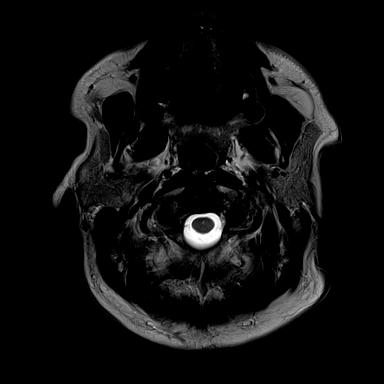

[Series 7: swi_images · axial · 1.5mm · 0.90mm/px · z∈[-75,+79]mm · 5 of 104 slices shown]
[im 1/104]
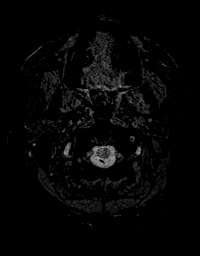
[im 26/104]
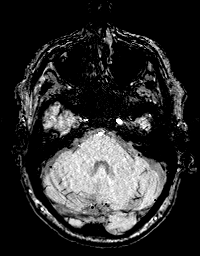
[im 52/104]
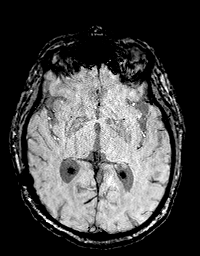
[im 78/104]
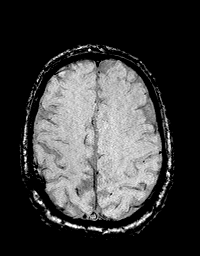
[im 104/104]
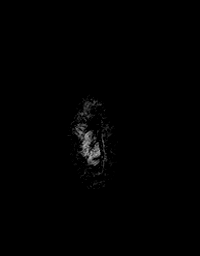

[Series 8: FLAIR · axial · 3.0mm · 0.86mm/px · z∈[-106,+86]mm · 3 of 65 slices shown (2 of 2)]
[im 1/65]
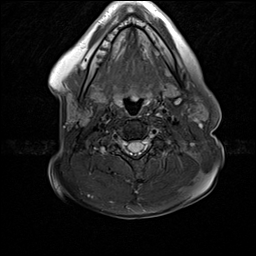
[im 33/65]
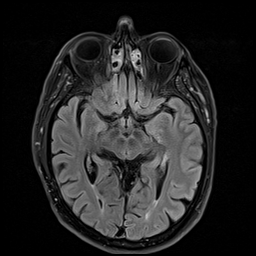
[im 65/65]
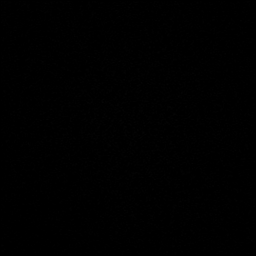

[Series 9: T2 · axial · non-contrast · 1.0mm · 0.86mm/px · z∈[-78,+78]mm · 8 of 160 slices shown (2 of 2)]
[im 1/160]
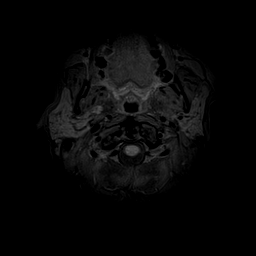
[im 23/160]
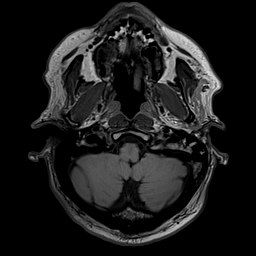
[im 46/160]
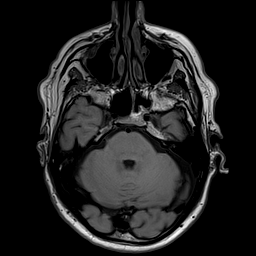
[im 69/160]
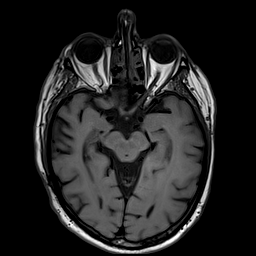
[im 91/160]
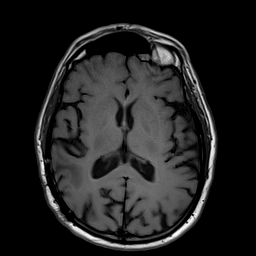
[im 114/160]
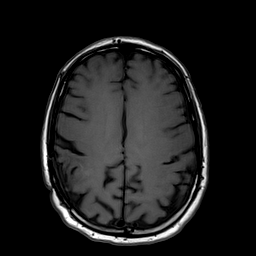
[im 137/160]
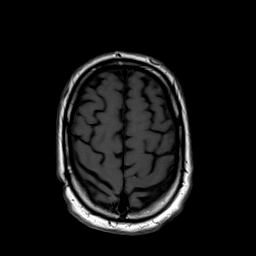
[im 160/160]
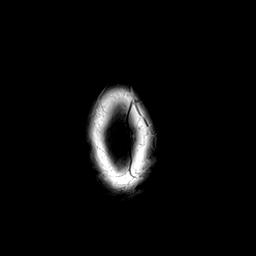

[Series 10: T2 post-contrast · coronal · 3.0mm · 0.57mm/px · 2 of 47 slices shown (1 of 2)]
[im 1/47]
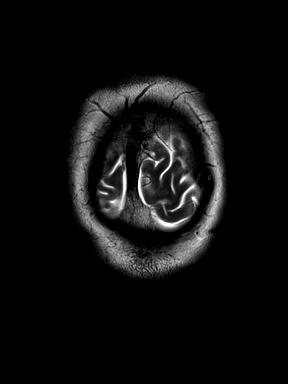
[im 47/47]
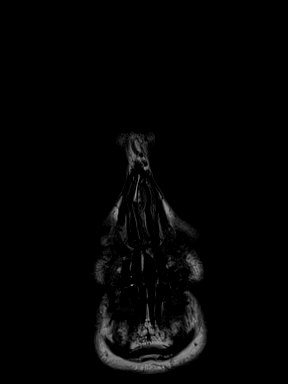

[Series 11: T2 post-contrast · axial · 1.0mm · 0.86mm/px · z∈[-78,+78]mm · 8 of 160 slices shown (2 of 2)]
[im 1/160]
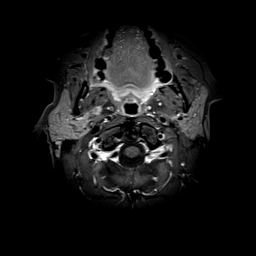
[im 23/160]
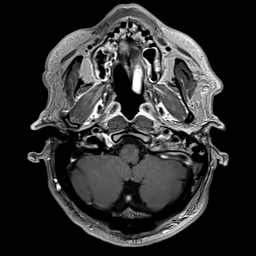
[im 46/160]
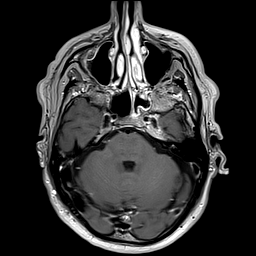
[im 69/160]
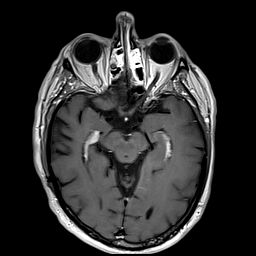
[im 91/160]
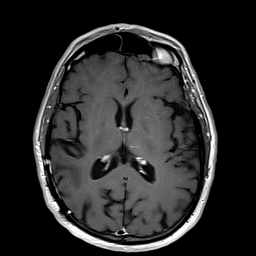
[im 114/160]
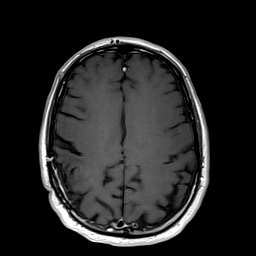
[im 137/160]
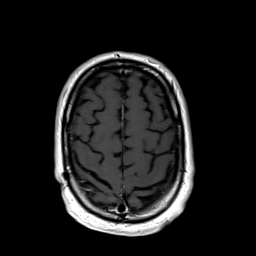
[im 160/160]
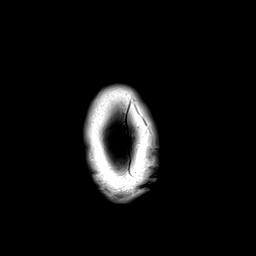

[Series 12: T1 post-contrast · axial · 1.0mm · 0.75mm/px · z∈[-80,+79]mm · 8 of 160 slices shown (1 of 2)]
[im 1/160]
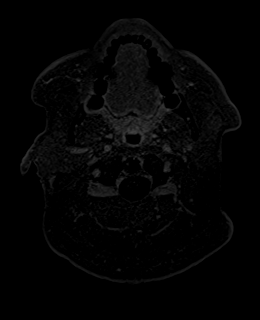
[im 23/160]
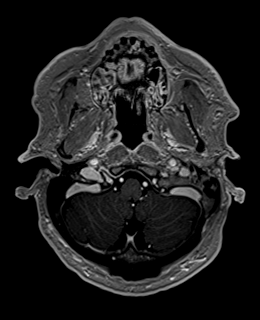
[im 46/160]
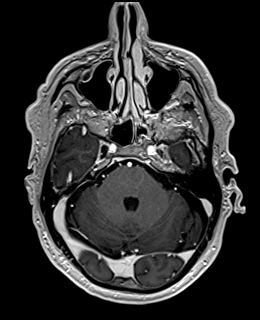
[im 69/160]
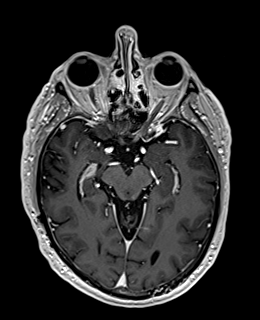
[im 91/160]
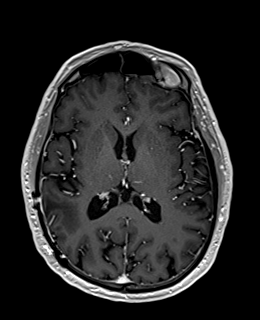
[im 114/160]
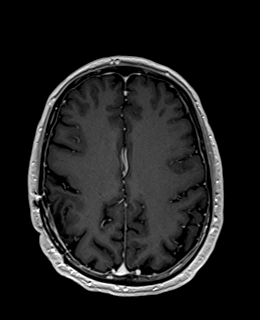
[im 137/160]
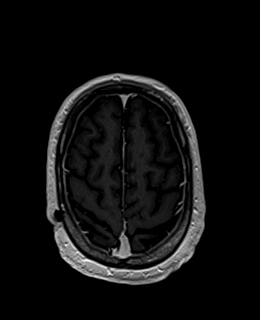
[im 160/160]
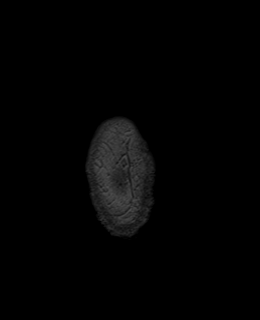

[Series 13: T1 post-contrast · coronal · 3.0mm · 0.57mm/px · 2 of 47 slices shown (2 of 2)]
[im 1/47]
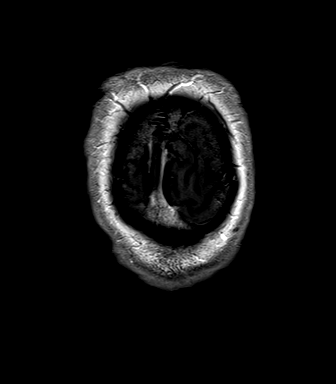
[im 47/47]
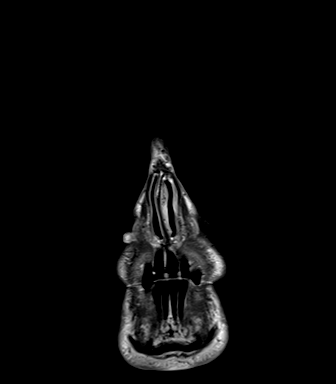

[Series 14: FLAIR post-contrast · sagittal · 3.0mm · 0.75mm/px · 2 of 41 slices shown]
[im 1/41]
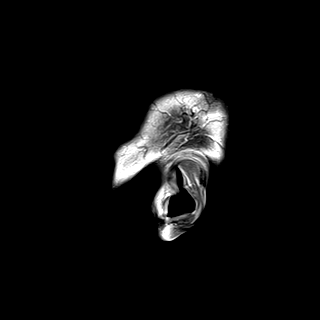
[im 41/41]
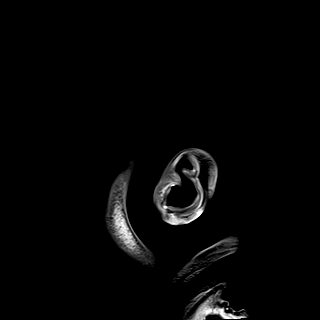

[48 of 48 positions shown; findings below may reference images not displayed]

FINDINGS: Brain:

No age advanced or lobar predominant parenchymal atrophy.

Redemonstrated sequela of prior right parietal craniotomy and right
parietal lobe tumor resection. Chronic hemosiderin deposition along
the resection cavity margins. Two small nodular foci of enhancement
are again noted along the medial aspect of the resection cavity.
There has been a continued interval increase in size of the larger
of these foci, now measuring 7 mm (for instance as seen on series
14, image 10) (previously 5 mm). T2 FLAIR hyperintense signal
abnormality surrounding the resection cavity is unchanged.

3 mm nodular enhancing focus along the anterior falx, present on
prior examinations dating back to [DATE] and possibly reflecting
a tiny meningioma (series 11, image 113).

No new enhancing intracranial lesions are identified.

Background minimal multifocal T2 FLAIR hyperintense signal
abnormality within the cerebral white matter, nonspecific but
compatible with chronic small vessel ischemic disease.

Punctate chronic microhemorrhage within the anterior left frontal
lobe, unchanged (series 7, image 69).

There is no acute infarct.

No extra-axial fluid collection.

No midline shift.

Vascular: Maintained flow voids within the proximal large arterial
vessels.

Skull and upper cervical spine: Right parietal craniotomy. Unchanged
7 mm enhancing focus within the left parietal calvarium.

Sinuses/Orbits: No mass or acute finding within the imaged orbits.
Moderate mucosal thickening within the bilateral ethmoid sinuses.
Minimal mucosal thickening within the bilateral sphenoid sinuses.
Mild mucosal thickening within the right maxillary sinus. Minimal
mucosal thickening within the left maxillary sinus.
IMPRESSION: 1. Since the prior brain MRI of [DATE], there has been a
continued mild increase in size of a nodular enhancing focus along
the medial aspect of the right parietal lobe resection cavity, now
measuring 7 mm (previously 5 mm). Differential considerations are
unchanged, tumor versus effect of prior radiation therapy.
2. Unchanged 3 mm nodular focus of enhancement along the anterior
falx, present on prior examinations dating back to [DATE] and
possibly reflecting a tiny meningioma.
3. Minimal chronic small vessel ischemic changes within the cerebral
white matter.
4. Paranasal sinus disease, as described.

## 2022-05-25 MED ORDER — GADOBENATE DIMEGLUMINE 529 MG/ML IV SOLN
16.0000 mL | Freq: Once | INTRAVENOUS | Status: AC | PRN
  Administered 2022-05-25: 16 mL via INTRAVENOUS

## 2022-05-28 ENCOUNTER — Inpatient Hospital Stay: Payer: Medicare Other

## 2022-05-29 ENCOUNTER — Inpatient Hospital Stay (HOSPITAL_BASED_OUTPATIENT_CLINIC_OR_DEPARTMENT_OTHER): Payer: Medicare Other | Admitting: Internal Medicine

## 2022-05-29 ENCOUNTER — Inpatient Hospital Stay (HOSPITAL_BASED_OUTPATIENT_CLINIC_OR_DEPARTMENT_OTHER): Payer: Medicare Other | Admitting: Oncology

## 2022-05-29 ENCOUNTER — Other Ambulatory Visit: Payer: Self-pay

## 2022-05-29 ENCOUNTER — Telehealth: Payer: Self-pay | Admitting: Internal Medicine

## 2022-05-29 VITALS — BP 149/78 | HR 55 | Temp 97.7°F | Resp 18 | Ht 69.0 in | Wt 170.0 lb

## 2022-05-29 DIAGNOSIS — N400 Enlarged prostate without lower urinary tract symptoms: Secondary | ICD-10-CM | POA: Diagnosis not present

## 2022-05-29 DIAGNOSIS — C4359 Malignant melanoma of other part of trunk: Secondary | ICD-10-CM | POA: Diagnosis not present

## 2022-05-29 DIAGNOSIS — Z923 Personal history of irradiation: Secondary | ICD-10-CM | POA: Diagnosis not present

## 2022-05-29 DIAGNOSIS — C7931 Secondary malignant neoplasm of brain: Secondary | ICD-10-CM

## 2022-05-29 DIAGNOSIS — C439 Malignant melanoma of skin, unspecified: Secondary | ICD-10-CM | POA: Diagnosis not present

## 2022-05-29 DIAGNOSIS — E041 Nontoxic single thyroid nodule: Secondary | ICD-10-CM | POA: Diagnosis not present

## 2022-05-29 NOTE — Progress Notes (Signed)
[facility] [place], [place] [address] [phone]   Interval Evaluation  Date of Service: 05/29/22 Patient Name: Walter Rodriguez Patient MRN: [id number] Patient DOB: 11/27/1956 Provider: Ventura Sellers, MD  Identifying Statement:  Walter Rodriguez is a 66 y.o. male with Metastasis to brain [facility]) [C79.31]   Primary Cancer:  Oncologic History: Oncology History  Melanoma of skin ([place])  09/07/2020 Initial Diagnosis   Melanoma of skin ([place])   09/22/2020 - 07/28/2021 Chemotherapy   Patient is on Treatment Plan : LUNG Nivolumab q28d     07/28/2021 Cancer Staging   Staging form: Melanoma of the Skin, AJCC 8th Edition - Clinical: Stage IV (cTX, cNX, pM1a(1)) - Signed by Wyatt Portela, MD on 07/28/2021   Metastasis to brain [facility]) (Resolved)  08/05/2020 Surgery   Craniotomy, resection of 4.7cm R parietal mass with Dr. Marcello Moores; path c/w melanoma    - 08/31/2020 Radiation Therapy   Completes 27Gy/92f to resection cavity with Dr. MLisbeth Renshaw    Interval History:  Walter Prophetpresents today for follow up after recent MRI brain.  Reports no clinical changes today.  Continues on surveillance with Dr. SAlen Blew  Cycling or walking at least 5 miles per day.    H+P (11/29/20) Patient presented to clinical attention in August 2021 with several weeks history of left arm and leg weakness.  He experienced a fall at home and was unable to get up, which prompted evaluation and CNS imaging.  MRI demonstrated large enhancing mass within right parietal lobe consistent with metastatic disease.  He underwent craniotomy and gross resection with Dr. TMarcello Mooreson 08/05/20, which was followed by post-op SRS in 3 fractions with Dr. MLisbeth Renshaw completed on 08/31/20.  He has completed decadron taper.  At present time he has no motor complaints and no subjective neurologic deficits.  Is undergoing rx with Nivolumab with Dr. SAlen Blew  Very active at home, walks and cycles every  day.  Medications: Current Outpatient Medications on File Prior to Visit  Medication Sig Dispense Refill   Multiple Vitamin (MULTI-VITAMIN) tablet Take 1 tablet by mouth daily.     triamcinolone cream (KENALOG) 0.1 % Apply 1 application topically 2 (two) times daily.     No current facility-administered medications on file prior to visit.    Allergies: No Known Allergies Past Medical History:  Past Medical History:  Diagnosis Date   [place]    [place]   Open wound    back   Past Surgical History:  Past Surgical History:  Procedure Laterality Date   APPLICATION OF CRANIAL NAVIGATION N/A 08/05/2020   Procedure: APPLICATION OF CRANIAL NAVIGATION;  Surgeon: TVallarie Mare MD;  [facility]  Service: Neurosurgery;  Laterality: N/A;   CRANIOTOMY N/A 08/05/2020   Procedure: CRANIOTOMY TUMOR EXCISION;  Surgeon: TVallarie Mare MD;  [place]  Service: Neurosurgery;  Laterality: N/A;   MELANOMA EXCISION WITH SENTINEL LYMPH NODE BIOPSY N/A 04/01/2019   Procedure: WIDE LOCAL EXCISION OF BACK MELANOMA WITH RIGHT AXILLARY SENTINEL LYMPH NODE BIOPSY;  Surgeon: BStark Klein MD;  [place] [facility]  Service: General;  Laterality: N/A;   SKIN SPLIT GRAFT N/A 04/10/2019   Procedure: SPLIT THICKNESS SKIN GRAFT FROM RIGHT  THIGH TO THE BACK;  Surgeon: TIrene Limbo MD;  [place] [facility]  Service: Plastics;  Laterality: N/A;   Social History:  Social History   Socioeconomic History   Marital status: Single  Spouse name: Not on file   Number of children: Not on file   Years of education: Not on file   Highest education level: Not on file  Occupational History   Not on file  Tobacco Use   Smoking status: Never   Smokeless tobacco: Never  Vaping Use   Vaping Use: Never used  Substance and Sexual Activity   Alcohol use: Never   Drug use: Never   Sexual activity: Not on file  Other Topics Concern   Not on file  Social History Narrative   Not on file   Social  Determinants of Health   Financial Resource Strain: Not on file  Food Insecurity: Not on file  Transportation Needs: Not on file  Physical Activity: Not on file  Stress: Not on file  Social Connections: Not on file  Intimate Partner Violence: Not on file   Family History: No family history on file.  Review of Systems: Constitutional: Doesn't report fevers, chills or abnormal weight loss Eyes: Doesn't report blurriness of vision Ears, nose, mouth, throat, and face: Doesn't report sore throat Respiratory: Doesn't report cough, dyspnea or wheezes Cardiovascular: Doesn't report palpitation, chest discomfort  Gastrointestinal:  Doesn't report nausea, constipation, diarrhea GU: Doesn't report incontinence Skin: Doesn't report skin rashes Neurological: Per HPI Musculoskeletal: Doesn't report joint pain Behavioral/Psych: Doesn't report anxiety  Physical Exam: Wt Readings from Last 3 Encounters:  05/29/22 170 lb (77.1 kg)  02/05/22 165 lb 8 oz (75.1 kg)  11/28/21 164 lb 8 oz (74.6 kg)   Temp Readings from Last 3 Encounters:  05/29/22 97.7 F (36.5 C) (Temporal)  02/05/22 (!) 97.2 F (36.2 C) (Tympanic)  11/28/21 98.1 F (36.7 C) (Temporal)   BP Readings from Last 3 Encounters:  05/29/22 (!) 149/78  02/05/22 137/82  11/28/21 (!) 156/84   Pulse Readings from Last 3 Encounters:  05/29/22 (!) 55  02/05/22 (!) 57  11/28/21 62    KPS: 90. General: Alert, cooperative, pleasant, in no acute distress Head: Normal EENT: No conjunctival injection or scleral icterus.  Lungs: Resp effort normal Cardiac: Regular rate Abdomen: Non-distended abdomen Skin: No rashes cyanosis or petechiae. Extremities: No clubbing or edema  Neurologic Exam: Mental Status: Awake, alert, attentive to examiner. Oriented to self and environment. Language is fluent with intact comprehension.  Cranial Nerves: Visual acuity is grossly normal. Visual fields are full. Extra-ocular movements intact. No  ptosis. Face is symmetric Motor: Tone and bulk are normal. Power is full in both arms and legs. Reflexes are symmetric, no pathologic reflexes present.  Sensory: Intact to light touch Gait: Normal.   Labs: I have reviewed the data as listed    Component Value Date/Time   NA 140 05/22/2022 0927   K 4.3 05/22/2022 0927   CL 105 05/22/2022 0927   CO2 29 05/22/2022 0927   GLUCOSE 99 05/22/2022 0927   BUN 19 05/22/2022 0927   CREATININE 1.20 05/22/2022 0927   CREATININE 1.19 07/27/2020 0836   CALCIUM 9.7 05/22/2022 0927   PROT 7.1 05/22/2022 0927   ALBUMIN 4.3 05/22/2022 0927   AST 16 05/22/2022 0927   ALT 14 05/22/2022 0927   ALKPHOS 49 05/22/2022 0927   BILITOT 0.5 05/22/2022 0927   GFRNONAA >60 05/22/2022 0927   GFRNONAA 63 07/27/2020 0836   GFRAA >60 08/07/2020 0429   GFRAA 73 07/27/2020 0836   Lab Results  Component Value Date   WBC 8.3 05/22/2022   NEUTROABS 5.1 05/22/2022   HGB 14.9 05/22/2022   HCT  44.1 05/22/2022   MCV 85.0 05/22/2022   PLT 217 05/22/2022    Imaging:  MR BRAIN W WO CONTRAST  Result Date: 05/27/2022 CLINICAL DATA:  Provided history: Brain/CNS neoplasm, assess treatment response. EXAM: MRI HEAD WITHOUT AND WITH CONTRAST TECHNIQUE: Multiplanar, multiecho pulse sequences of the brain and surrounding structures were obtained without and with intravenous contrast. CONTRAST:  29m MULTIHANCE GADOBENATE DIMEGLUMINE 529 MG/ML IV SOLN COMPARISON:  Prior brain MRI examinations 02/02/2022 and earlier. FINDINGS: Brain: No age advanced or lobar predominant parenchymal atrophy. Redemonstrated sequela of prior right parietal craniotomy and right parietal lobe tumor resection. Chronic hemosiderin deposition along the resection cavity margins. Two small nodular foci of enhancement are again noted along the medial aspect of the resection cavity. There has been a continued interval increase in size of the larger of these foci, now measuring 7 mm (for instance as seen on  series 14, image 10) (previously 5 mm). T2 FLAIR hyperintense signal abnormality surrounding the resection cavity is unchanged. 3 mm nodular enhancing focus along the anterior falx, present on prior examinations dating back to 08/22/2020 and possibly reflecting a tiny meningioma (series 11, image 113). No new enhancing intracranial lesions are identified. Background minimal multifocal T2 FLAIR hyperintense signal abnormality within the cerebral white matter, nonspecific but compatible with chronic small vessel ischemic disease. Punctate chronic microhemorrhage within the anterior left frontal lobe, unchanged (series 7, image 69). There is no acute infarct. No extra-axial fluid collection. No midline shift. Vascular: Maintained flow voids within the proximal large arterial vessels. Skull and upper cervical spine: Right parietal craniotomy. Unchanged 7 mm enhancing focus within the left parietal calvarium. Sinuses/Orbits: No mass or acute finding within the imaged orbits. Moderate mucosal thickening within the bilateral ethmoid sinuses. Minimal mucosal thickening within the bilateral sphenoid sinuses. Mild mucosal thickening within the right maxillary sinus. Minimal mucosal thickening within the left maxillary sinus. IMPRESSION: 1. Since the prior brain MRI of 02/02/2022, there has been a continued mild increase in size of a nodular enhancing focus along the medial aspect of the right parietal lobe resection cavity, now measuring 7 mm (previously 5 mm). Differential considerations are unchanged, tumor versus effect of prior radiation therapy. 2. Unchanged 3 mm nodular focus of enhancement along the anterior falx, present on prior examinations dating back to 08/22/2020 and possibly reflecting a tiny meningioma. 3. Minimal chronic small vessel ischemic changes within the cerebral white matter. 4. Paranasal sinus disease, as described. Electronically Signed   By: KKellie SimmeringD.O.   On: 05/27/2022 16:39   CT CHEST  ABDOMEN PELVIS W CONTRAST  Result Date: 05/23/2022 CLINICAL DATA:  Metastatic melanoma, monitor, immunotherapy and XRT complete * Tracking Code: BO * EXAM: CT CHEST, ABDOMEN, AND PELVIS WITH CONTRAST TECHNIQUE: Multidetector CT imaging of the chest, abdomen and pelvis was performed following the standard protocol during bolus administration of intravenous contrast. RADIATION DOSE REDUCTION: This exam was performed according to the departmental dose-optimization program which includes automated exposure control, adjustment of the mA and/or kV according to patient size and/or use of iterative reconstruction technique. CONTRAST:  1092mOMNIPAQUE IOHEXOL 300 MG/ML SOLN additional oral enteric contrast COMPARISON:  CT chest abdomen pelvis, 11/21/2021, MR abdomen, 03/06/2021 FINDINGS: CT CHEST FINDINGS Cardiovascular: Aortic atherosclerosis. Normal heart size. No pericardial effusion. Mediastinum/Nodes: No enlarged mediastinal, hilar, or axillary lymph nodes. Status post right axillary lymph node dissection (series 2, image 17). Thyroid gland, trachea, and esophagus demonstrate no significant findings. Lungs/Pleura: Lungs are clear. No pleural effusion or pneumothorax. Musculoskeletal: No  chest wall mass or suspicious osseous lesions identified. CT ABDOMEN PELVIS FINDINGS Hepatobiliary: No solid liver abnormality is seen. Subtle hypodense lesion of the posterior liver dome, hepatic segment VII, is unchanged, measuring 1.1 cm (series 2, image 55). No gallstones, gallbladder wall thickening, or biliary dilatation. Pancreas: Unremarkable. No pancreatic ductal dilatation or surrounding inflammatory changes. Spleen: Normal in size without significant abnormality. Adrenals/Urinary Tract: Adrenal glands are unremarkable. Simple, benign bilateral renal cortical cysts, for which no further follow-up or characterization is required. Kidneys are otherwise normal, without renal calculi, solid lesion, or hydronephrosis. Bladder is  unremarkable. Stomach/Bowel: Small diverticulum of the gastric fundus (series 2, image 55). Stomach is otherwise within normal limits. Appendix appears normal. No evidence of bowel wall thickening, distention, or inflammatory changes. Sigmoid diverticula. Vascular/Lymphatic: Aortic atherosclerosis. No enlarged abdominal or pelvic lymph nodes. Reproductive: Mild prostatomegaly. Other: No abdominal wall hernia or abnormality. No ascites. Musculoskeletal: No acute osseous findings. IMPRESSION: 1. No evidence of metastatic disease in the chest, abdomen, or pelvis. 2. Unchanged, subtle hypodense lesion of the posterior liver dome, hepatic segment VII, previously better characterized by MRI and again most likely a small, benign hemangioma. Continued attention on follow-up. 3. Mild prostatomegaly. Aortic Atherosclerosis (ICD10-I70.0). Electronically Signed   By: Delanna Ahmadi M.D.   On: 05/23/2022 13:45   CT Soft Tissue Neck W Contrast  Result Date: 05/23/2022 CLINICAL DATA:  Melanoma. Immunotherapy complete. Radiation therapy complete. Routine follow-up. EXAM: CT NECK WITH CONTRAST TECHNIQUE: Multidetector CT imaging of the neck was performed using the standard protocol following the bolus administration of intravenous contrast. RADIATION DOSE REDUCTION: This exam was performed according to the departmental dose-optimization program which includes automated exposure control, adjustment of the mA and/or kV according to patient size and/or use of iterative reconstruction technique. CONTRAST:  1101m OMNIPAQUE IOHEXOL 300 MG/ML  SOLN COMPARISON:  Chest CT 11/21/2021.  Neck CT 03/03/2019. FINDINGS: Pharynx and larynx: No mucosal or submucosal lesion. Salivary glands: Parotid and submandibular glands are normal. Thyroid: Enlarging thyroid nodule of the inferior aspect of the right lobe of the thyroid, now measuring up to 2.2 cm in diameter. Thyroid ultrasound recommended. Lymph nodes: No lymphadenopathy on either side of  the neck. Normal bilateral cervical chain lymph nodes. Skin thickening in the region of the initial tumor at the posterior junction of the neck and chest. Vascular: No abnormal vascular finding. Limited intracranial: Normal Visualized orbits: Normal Mastoids and visualized paranasal sinuses: Scattered opacified ethmoid air cells. Mucosal thickening at the right maxillary sinus floor. Skeleton: Ordinary cervical spondylosis. Chronic facet arthropathy with facet fusion on the left at C4-5. Upper chest: See results of chest CT. Other: None IMPRESSION: Skin thickening at the site of the previous tumor at the posterior junction of the neck and chest. Findings consistent with post treatment changes. No lymphadenopathy in the neck region. Enlarging nodule of the right lobe of the thyroid, now up to 2.2 cm in size. Recommend thyroid [place](ref: J Am Coll Radiol. 2015 Feb;12(2): 143-50). Electronically Signed   By: MNelson ChimesM.D.   On: 05/23/2022 09:55     CCottonwoodClinician Interpretation: I have personally reviewed the radiological images as listed.  My interpretation, in the context of the patient's clinical presentation, is treatment effect vs true progression   Assessment/Plan Brain Metastasis  FBEE HAMMERSCHMIDTis clinically stable today.  MRI again demonstrates subtle progression of foci of enhancement adjacent to the resection cavity over past 4 months.  Suspect etiology is blood brain barrier disruption from radiation  treatment effect, vs actual tumor progression.  Will con't to survey with imaging at this time, as discussed in tumor board.  We again encouraged him to remain active and continue aerobic exercise regimen.  We appreciate the opportunity to participate in the care of Walter Rodriguez.   We ask that Walter Rodriguez return to clinic in 4 months following next brain MRI, or sooner as needed.  All questions were answered. The patient knows to call the clinic with any problems, questions or  concerns. No barriers to learning were detected.  The total time spent in the encounter was 30 minutes and more than 50% was on counseling and review of test results   Ventura Sellers, MD Medical Director of Neuro-Oncology [facility] [place] 05/29/22 10:43 AM

## 2022-05-29 NOTE — Telephone Encounter (Signed)
Per 6/20 los called and left message for pt about appointment details and call back number were left

## 2022-05-29 NOTE — Progress Notes (Signed)
Hematology and Oncology Follow Up Visit  AMANTE FOMBY [id number] [birth year] 66 y.o. 05/29/2022 8:49 AM [name], MDWhite, [name], MD   Principle Diagnosis: 66 year old man with T4N0 melanoma of the upper back diagnosed in 2020.  He developed stage IV with CNS metastasis in August 2021.    Prior Therapy:  He is status post local wide excision as well as right axillary sentinel lymph node sampling completed by Dr. Barry Dienes on April 01, 2019.  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 Iran Planas on Apr 10, 2019.  He is status post right craniotomy and excision of brain tumor completed on August 05, 2020 by Dr. Marcello Moores.  He status post radiation therapy following his craniotomy completed on August 25, 2020.  He received 27 Gy in 3 fractions utilizing stereotactic radiosurgery.  Nivolumab 480 mg every 4 weeks started on September 22, 2020.  He completed 12 cycles of therapy in August 2022.   Current therapy: Active surveillance.  Interim History: Mr. Kelleher presents today for repeat evaluation.  Since last visit, he reports feeling well without any major issues or concerns.  He denies any nausea, vomiting or abdominal pain.  He denies any neck pain or discomfort.  He denies any hospitalizations or illnesses.   Medications: Updated on review. Current Outpatient Medications  Medication Sig Dispense Refill   Multiple Vitamin (MULTI-VITAMIN) tablet Take 1 tablet by mouth daily.     triamcinolone cream (KENALOG) 0.1 % Apply 1 application topically 2 (two) times daily.     No current facility-administered medications for this visit.     Allergies: No Known Allergies      Physical Exam:     Blood pressure (!) 149/78, pulse (!) 55, temperature 97.7 F (36.5 C), temperature source Temporal, resp. rate 18, height '5\' 9"'$  (1.753 m), weight 170 lb (77.1 kg), SpO2 100 %.       ECOG: 0     General appearance: Alert, awake without any distress. Head: Atraumatic without abnormalities Oropharynx: Without any thrush or ulcers. Eyes: No scleral icterus. Lymph nodes: No lymphadenopathy noted in the cervical, supraclavicular, or axillary nodes Heart:regular rate and rhythm, without any murmurs or gallops.   Lung: Clear to auscultation without any rhonchi, wheezes or dullness to percussion. Abdomin: Soft, nontender without any shifting dullness or ascites. Musculoskeletal: No clubbing or cyanosis. Neurological: No motor or sensory deficits. Skin: No rashes or lesions.                Lab Results: Lab Results  Component Value Date   WBC 8.3 05/22/2022   HGB 14.9 05/22/2022   HCT 44.1 05/22/2022   MCV 85.0 05/22/2022   PLT 217 05/22/2022     Chemistry      Component Value Date/Time   NA 140 05/22/2022 0927   K 4.3 05/22/2022 0927   CL 105 05/22/2022 0927   CO2 29 05/22/2022 0927   BUN 19 05/22/2022 0927   CREATININE 1.20 05/22/2022 0927   CREATININE 1.19 07/27/2020 0836      Component Value Date/Time   CALCIUM 9.7 05/22/2022 0927   ALKPHOS 49 05/22/2022 0927   AST 16 05/22/2022 0927   ALT 14 05/22/2022 0927   BILITOT 0.5 05/22/2022 9323      Study Result  Narrative & Impression  CLINICAL DATA:  Provided history: Brain/CNS neoplasm, assess treatment response.   EXAM: MRI HEAD WITHOUT AND WITH CONTRAST  TECHNIQUE: Multiplanar, multiecho pulse sequences of the brain and surrounding structures were obtained without and with intravenous contrast.   CONTRAST:  34m MULTIHANCE GADOBENATE DIMEGLUMINE 529 MG/ML IV SOLN   COMPARISON:  Prior brain MRI examinations 02/02/2022 and earlier.   FINDINGS: Brain:   No age advanced or lobar predominant parenchymal atrophy.   Redemonstrated sequela of prior right parietal craniotomy and right parietal lobe tumor resection. Chronic hemosiderin deposition along the resection cavity margins. Two small  nodular foci of enhancement are again noted along the medial aspect of the resection cavity. There has been a continued interval increase in size of the larger of these foci, now measuring 7 mm (for instance as seen on series 14, image 10) (previously 5 mm). T2 FLAIR hyperintense signal abnormality surrounding the resection cavity is unchanged.   3 mm nodular enhancing focus along the anterior falx, present on prior examinations dating back to 08/22/2020 and possibly reflecting a tiny meningioma (series 11, image 113).   No new enhancing intracranial lesions are identified.   Background minimal multifocal T2 FLAIR hyperintense signal abnormality within the cerebral white matter, nonspecific but compatible with chronic small vessel ischemic disease.   Punctate chronic microhemorrhage within the anterior left frontal lobe, unchanged (series 7, image 69).   There is no acute infarct.   No extra-axial fluid collection.   No midline shift.   Vascular: Maintained flow voids within the proximal large arterial vessels.   Skull and upper cervical spine: Right parietal craniotomy. Unchanged 7 mm enhancing focus within the left parietal calvarium.   Sinuses/Orbits: No mass or acute finding within the imaged orbits. Moderate mucosal thickening within the bilateral ethmoid sinuses. Minimal mucosal thickening within the bilateral sphenoid sinuses. Mild mucosal thickening within the right maxillary sinus. Minimal mucosal thickening within the left maxillary sinus.   IMPRESSION: 1. Since the prior brain MRI of 02/02/2022, there has been a continued mild increase in size of a nodular enhancing focus along the medial aspect of the right parietal lobe resection cavity, now measuring 7 mm (previously 5 mm). Differential considerations are unchanged, tumor versus effect of prior radiation therapy. 2. Unchanged 3 mm nodular focus of enhancement along the anterior falx, present on prior  examinations dating back to 08/22/2020 and possibly reflecting a tiny meningioma. 3. Minimal chronic small vessel ischemic changes within the cerebral white matter. 4. Paranasal sinus disease, as described.   IMPRESSION: 1. No evidence of metastatic disease in the chest, abdomen, or pelvis. 2. Unchanged, subtle hypodense lesion of the posterior liver dome, hepatic segment VII, previously better characterized by MRI and again most likely a small, benign hemangioma. Continued attention on follow-up. 3. Mild prostatomegaly.   Aortic Atherosclerosis (ICD10-I70.0).      66year old with:  1.  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  He is currently on active surveillance without any evidence to suggest relapsed disease.  CT scan obtained on May 22, 2022 shows no evidence of metastatic disease.  Plan is to continue with active surveillance and repeat imaging studies in 6 months.  Salvage therapy with combination ipilimumab and nivolumab would be considered upon relapse.   2.  CNS metastasis: He continues to be on active surveillance.  MRI of the brain obtained on 05/25/2022 showed a mild increase in the right parietal lobe resection area.  He is following up with Dr. VMickeal Skinnerregarding this issue.  3.  Dermatology surveillance: He is up-to-date at this time and I recommended continuing.  4.  Thyroid nodule: Slightly enlarging but appears to be benign.  Will obtain ultrasound and possible biopsy if needed.  5.  Follow-up: He will return in 6 months for follow-up visit.   30  minutes were spent on this visit.  The time was dedicated to reviewing laboratory data, disease status update and outlining future plan of care review.      Zola Button, MD 6/20/20238:49 AM

## 2022-06-08 ENCOUNTER — Other Ambulatory Visit: Payer: Self-pay | Admitting: Oncology

## 2022-06-08 ENCOUNTER — Ambulatory Visit (HOSPITAL_COMMUNITY)
Admission: RE | Admit: 2022-06-08 | Discharge: 2022-06-08 | Disposition: A | Discharge status: Home / Self Care | Payer: Medicare Other | Source: Ambulatory Visit | Attending: Oncology | Admitting: Oncology

## 2022-06-08 DIAGNOSIS — C439 Malignant melanoma of skin, unspecified: Secondary | ICD-10-CM | POA: Insufficient documentation

## 2022-06-08 DIAGNOSIS — E041 Nontoxic single thyroid nodule: Secondary | ICD-10-CM

## 2022-06-08 DIAGNOSIS — E010 Iodine-deficiency related diffuse (endemic) goiter: Secondary | ICD-10-CM | POA: Diagnosis not present

## 2022-08-14 DIAGNOSIS — L578 Other skin changes due to chronic exposure to nonionizing radiation: Secondary | ICD-10-CM | POA: Diagnosis not present

## 2022-08-14 DIAGNOSIS — L570 Actinic keratosis: Secondary | ICD-10-CM | POA: Diagnosis not present

## 2022-08-14 DIAGNOSIS — C7931 Secondary malignant neoplasm of brain: Secondary | ICD-10-CM | POA: Diagnosis not present

## 2022-08-14 DIAGNOSIS — D229 Melanocytic nevi, unspecified: Secondary | ICD-10-CM | POA: Diagnosis not present

## 2022-08-29 ENCOUNTER — Telehealth: Payer: Self-pay

## 2022-08-29 NOTE — Patient Outreach (Signed)
  Care Coordination   08/29/2022 Name: Walter Rodriguez MRN: [id number] DOB: 1956/02/17   Care Coordination Outreach Attempts:  An unsuccessful telephone outreach was attempted today to offer the patient information about available care coordination services as a benefit of their health plan.   Follow Up Plan:  Additional outreach attempts will be made to offer the patient care coordination information and services.   Encounter Outcome:  No Answer  Care Coordination Interventions Activated:  No   Care Coordination Interventions:  No, not indicated    Jone Baseman, RN, MSN [facility] Care Management Care Management Coordinator Direct Line [phone]

## 2022-09-06 ENCOUNTER — Telehealth: Payer: Self-pay

## 2022-09-06 NOTE — Patient Instructions (Signed)
Visit Information  Thank you for taking time to visit with me today. Please don't hesitate to contact me if I can be of assistance to you.   Following are the goals we discussed today:   Goals Addressed             This Visit's Progress    COMPLETED: Care coordination activities-no follow up required       Care Coordination Interventions: Advised patient to schedule annual wellness visit. Patient to call office.           If you are experiencing a Mental Health or [place] or need someone to talk to, please    Patient verbalizes understanding of instructions and care plan provided today and agrees to view in [place]. Active MyChart status and patient understanding of how to access instructions and care plan via MyChart confirmed with patient.     No further follow up required: patient decline  Jone Baseman, RN, MSN [place] Management Care Management Coordinator Direct Line [phone]

## 2022-09-06 NOTE — Patient Outreach (Signed)
  Care Coordination   09/06/2022 Name: Walter Rodriguez MRN: [id number] DOB: 03-Sep-1956   Care Coordination Outreach Attempts:  A second unsuccessful outreach was attempted today to offer the patient with information about available care coordination services as a benefit of their health plan.     Follow Up Plan:  Additional outreach attempts will be made to offer the patient care coordination information and services.   Encounter Outcome:  No Answer  Care Coordination Interventions Activated:  No   Care Coordination Interventions:  No, not indicated    Jone Baseman, RN, MSN [facility] Care Management Care Management Coordinator Direct Line [phone]

## 2022-09-06 NOTE — Patient Outreach (Signed)
  Care Coordination   Initial Visit Note   09/06/2022 Name: Walter Rodriguez MRN: [id number] DOB: 1956-03-09  Walter Rodriguez is a 66 y.o. year old male who sees [name], MD for primary care. I spoke with  [name] by phone today.  What matters to the patients health and wellness today?  none    Goals Addressed             This Visit's Progress    COMPLETED: Care coordination activities-no follow up required       Care Coordination Interventions: Advised patient to schedule annual wellness visit. Patient to call office.          SDOH assessments and interventions completed:  Yes     Care Coordination Interventions Activated:  Yes  Care Coordination Interventions:  Yes, provided   Follow up plan: No further intervention required.   Encounter Outcome:  Pt. Visit Completed    Jone Baseman, RN, MSN [place] Management Care Management Coordinator Direct Line [phone]

## 2022-09-17 ENCOUNTER — Other Ambulatory Visit: Payer: Self-pay | Admitting: Radiation Therapy

## 2022-09-21 ENCOUNTER — Ambulatory Visit
Admission: RE | Admit: 2022-09-21 | Discharge: 2022-09-21 | Disposition: A | Discharge status: Home / Self Care | Payer: Medicare Other | Source: Ambulatory Visit | Attending: Internal Medicine | Admitting: Internal Medicine

## 2022-09-21 DIAGNOSIS — C7931 Secondary malignant neoplasm of brain: Secondary | ICD-10-CM

## 2022-09-21 DIAGNOSIS — C439 Malignant melanoma of skin, unspecified: Secondary | ICD-10-CM | POA: Diagnosis not present

## 2022-09-21 MED ORDER — GADOPICLENOL 0.5 MMOL/ML IV SOLN
8.0000 mL | Freq: Once | INTRAVENOUS | Status: AC | PRN
  Administered 2022-09-21: 8 mL via INTRAVENOUS

## 2022-09-24 ENCOUNTER — Inpatient Hospital Stay: Payer: Medicare Other | Attending: Oncology

## 2022-09-24 DIAGNOSIS — C7931 Secondary malignant neoplasm of brain: Secondary | ICD-10-CM | POA: Insufficient documentation

## 2022-09-24 DIAGNOSIS — C4359 Malignant melanoma of other part of trunk: Secondary | ICD-10-CM | POA: Insufficient documentation

## 2022-09-24 DIAGNOSIS — Z923 Personal history of irradiation: Secondary | ICD-10-CM | POA: Insufficient documentation

## 2022-09-25 ENCOUNTER — Inpatient Hospital Stay (HOSPITAL_BASED_OUTPATIENT_CLINIC_OR_DEPARTMENT_OTHER): Payer: Medicare Other | Admitting: Internal Medicine

## 2022-09-25 VITALS — BP 147/75 | HR 60 | Temp 98.2°F | Resp 15 | Wt 165.7 lb

## 2022-09-25 DIAGNOSIS — Z923 Personal history of irradiation: Secondary | ICD-10-CM | POA: Diagnosis not present

## 2022-09-25 DIAGNOSIS — C7931 Secondary malignant neoplasm of brain: Secondary | ICD-10-CM | POA: Diagnosis not present

## 2022-09-25 DIAGNOSIS — C4359 Malignant melanoma of other part of trunk: Secondary | ICD-10-CM | POA: Diagnosis not present

## 2022-09-25 NOTE — Progress Notes (Signed)
[facility] at [place] [place], [place] [address] [phone]   Interval Evaluation  Date of Service: 09/25/22 Patient Name: Walter Rodriguez Patient MRN: [id number] Patient DOB: 1956/06/03 Provider: Ventura Sellers, MD  Identifying Statement:  Walter Rodriguez is a 66 y.o. male with Metastasis to brain [facility]) [C79.31]   Primary Cancer:  Oncologic History: Oncology History  Melanoma of skin ([place])  09/07/2020 Initial Diagnosis   Melanoma of skin ([place])   09/22/2020 - 07/28/2021 Chemotherapy   Patient is on Treatment Plan : LUNG Nivolumab q28d     07/28/2021 Cancer Staging   Staging form: Melanoma of the Skin, AJCC 8th Edition - Clinical: Stage IV (cTX, cNX, pM1a(1)) - Signed by Wyatt Portela, MD on 07/28/2021   Metastasis to brain [facility]) (Resolved)  08/05/2020 Surgery   Craniotomy, resection of 4.7cm R parietal mass with Dr. Marcello Moores; path c/w melanoma    - 08/31/2020 Radiation Therapy   Completes 27Gy/55f to resection cavity with Dr. MLisbeth Renshaw    Interval History:  FHaig Prophetpresents today for follow up after recent MRI brain.  No new or progressive deficits.  Continues on surveillance with Dr. SAlen Blew  Cycling or walking at least 5 miles per day.    H+P (11/29/20) Patient presented to clinical attention in August 2021 with several weeks history of left arm and leg weakness.  He experienced a fall at home and was unable to get up, which prompted evaluation and CNS imaging.  MRI demonstrated large enhancing mass within right parietal lobe consistent with metastatic disease.  He underwent craniotomy and gross resection with Dr. TMarcello Mooreson 08/05/20, which was followed by post-op SRS in 3 fractions with Dr. MLisbeth Renshaw completed on 08/31/20.  He has completed decadron taper.  At present time he has no motor complaints and no subjective neurologic deficits.  Is undergoing rx with Nivolumab with Dr. SAlen Blew  Very active at home, walks and cycles every  day.  Medications: Current Outpatient Medications on File Prior to Visit  Medication Sig Dispense Refill   Multiple Vitamin (MULTI-VITAMIN) tablet Take 1 tablet by mouth daily.     No current facility-administered medications on file prior to visit.    Allergies: No Known Allergies Past Medical History:  Past Medical History:  Diagnosis Date   Melanoma ([place]    back   Open wound    back   Past Surgical History:  Past Surgical History:  Procedure Laterality Date   APPLICATION OF CRANIAL NAVIGATION N/A 08/05/2020   Procedure: APPLICATION OF CRANIAL NAVIGATION;  Surgeon: TVallarie Mare MD;  [place] [facility]  Service: Neurosurgery;  Laterality: N/A;   CRANIOTOMY N/A 08/05/2020   Procedure: CRANIOTOMY TUMOR EXCISION;  Surgeon: TVallarie Mare MD;  [place] [facility]  Service: Neurosurgery;  Laterality: N/A;   MELANOMA EXCISION WITH SENTINEL LYMPH NODE BIOPSY N/A 04/01/2019   Procedure: WIDE LOCAL EXCISION OF BACK MELANOMA WITH RIGHT AXILLARY SENTINEL LYMPH NODE BIOPSY;  Surgeon: BStark Klein MD;  Location: [facility]  Service: General;  Laterality: N/A;   SKIN SPLIT GRAFT N/A 04/10/2019   Procedure: SPLIT THICKNESS SKIN GRAFT FROM RIGHT  THIGH TO THE BACK;  Surgeon: TIrene Limbo MD;  [place] [facility]  Service: Plastics;  Laterality: N/A;   Social History:  Social History   Socioeconomic History   Marital status: Single    Spouse name: Not on file   Number of children: Not on file   Years  of education: Not on file   Highest education level: Not on file  Occupational History   Not on file  Tobacco Use   Smoking status: Never   Smokeless tobacco: Never  Vaping Use   Vaping Use: Never used  Substance and Sexual Activity   Alcohol use: Never   Drug use: Never   Sexual activity: Not on file  Other Topics Concern   Not on file  Social History Narrative   Not on file   Social Determinants of Health   Financial Resource Strain: Not on file  Food Insecurity: Not on  file  Transportation Needs: No Transportation Needs (09/06/2022)   PRAPARE - Transportation    Lack of Transportation (Medical): No    Lack of Transportation (Non-Medical): No  Physical Activity: Not on file  Stress: Not on file  Social Connections: Not on file  Intimate Partner Violence: Not on file   Family History: No family history on file.  Review of Systems: Constitutional: Doesn't report fevers, chills or abnormal weight loss Eyes: Doesn't report blurriness of vision Ears, nose, mouth, throat, and face: Doesn't report sore throat Respiratory: Doesn't report cough, dyspnea or wheezes Cardiovascular: Doesn't report palpitation, chest discomfort  Gastrointestinal:  Doesn't report nausea, constipation, diarrhea GU: Doesn't report incontinence Skin: Doesn't report skin rashes Neurological: Per HPI Musculoskeletal: Doesn't report joint pain Behavioral/Psych: Doesn't report anxiety  Physical Exam: Wt Readings from Last 3 Encounters:  09/25/22 165 lb 11.2 oz (75.2 kg)  05/29/22 170 lb (77.1 kg)  02/05/22 165 lb 8 oz (75.1 kg)   Temp Readings from Last 3 Encounters:  09/25/22 98.2 F (36.8 C) (Temporal)  05/29/22 97.7 F (36.5 C) (Temporal)  02/05/22 (!) 97.2 F (36.2 C) (Tympanic)   BP Readings from Last 3 Encounters:  09/25/22 (!) 147/75  05/29/22 (!) 149/78  02/05/22 137/82   Pulse Readings from Last 3 Encounters:  09/25/22 60  05/29/22 (!) 55  02/05/22 (!) 57    KPS: 90. General: Alert, cooperative, pleasant, in no acute distress Head: Normal EENT: No conjunctival injection or scleral icterus.  Lungs: Resp effort normal Cardiac: Regular rate Abdomen: Non-distended abdomen Skin: No rashes cyanosis or petechiae. Extremities: No clubbing or edema  Neurologic Exam: Mental Status: Awake, alert, attentive to examiner. Oriented to self and environment. Language is fluent with intact comprehension.  Cranial Nerves: Visual acuity is grossly normal. Visual fields  are full. Extra-ocular movements intact. No ptosis. Face is symmetric Motor: Tone and bulk are normal. Power is full in both arms and legs. Reflexes are symmetric, no pathologic reflexes present.  Sensory: Intact to light touch Gait: Normal.   Labs: I have reviewed the data as listed    Component Value Date/Time   NA 140 05/22/2022 0927   K 4.3 05/22/2022 0927   CL 105 05/22/2022 0927   CO2 29 05/22/2022 0927   GLUCOSE 99 05/22/2022 0927   BUN 19 05/22/2022 0927   CREATININE 1.20 05/22/2022 0927   CREATININE 1.19 07/27/2020 0836   CALCIUM 9.7 05/22/2022 0927   PROT 7.1 05/22/2022 0927   ALBUMIN 4.3 05/22/2022 0927   AST 16 05/22/2022 0927   ALT 14 05/22/2022 0927   ALKPHOS 49 05/22/2022 0927   BILITOT 0.5 05/22/2022 0927   GFRNONAA >60 05/22/2022 0927   GFRNONAA 63 07/27/2020 0836   GFRAA >60 08/07/2020 0429   GFRAA 73 07/27/2020 0836   Lab Results  Component Value Date   WBC 8.3 05/22/2022   NEUTROABS 5.1 05/22/2022  HGB 14.9 05/22/2022   HCT 44.1 05/22/2022   MCV 85.0 05/22/2022   PLT 217 05/22/2022    Imaging:  MR BRAIN W WO CONTRAST  Result Date: 09/22/2022 CLINICAL DATA:  Metastatic melanoma, assess treatment response EXAM: MRI HEAD WITHOUT AND WITH CONTRAST TECHNIQUE: Multiplanar, multiecho pulse sequences of the brain and surrounding structures were obtained without and with intravenous contrast. CONTRAST:  8 mL Vueway COMPARISON:  05/25/2022 FINDINGS: Brain: Status post right parietal craniotomy with subjacent resection cavity. Previously noted foci of enhancement along the medial aspect of the resection cavity have slightly increased in size and are now continuous. The largest component measures 8 x 4 x 8 mm (AP x TR x CC) (series 14, image 103 and series 15, image 16), previously 7 x 3 x 6 mm when remeasured similarly. Slightly increased surrounding T2 hyperintense signal. Hemosiderin deposition is associated with the resection margin. Redemonstrated 3 mm  nodular enhancing focus along the anterior falx (series 14, image 105), unchanged, which may represent a tiny meningioma. No new enhancing lesions are seen. No restricted diffusion to suggest acute or subacute infarct. No acute hemorrhage, mass effect, or midline shift. Punctate focus of hemosiderin deposition in the left frontal lobe, likely sequela of prior microhemorrhage. Vascular: Normal arterial flow voids. Normal arterial and venous enhancement. Skull and upper cervical spine: Prior right parietal craniotomy. Unchanged 7 mm enhancing focus in the left parietal calvarium. Otherwise normal marrow signal. Sinuses/Orbits: Mucosal thickening in the maxillary sinuses and ethmoid air cells. No acute finding in the orbits. Other: The mastoids are well aerated. IMPRESSION: 1. Further slight increase in the size of the nodular enhancing focus along the medial aspect of the right parietal resection cavity which remains concerning for tumor, given progressive growth over several scans. Continued attention on follow-up is recommended. 2. Unchanged 3 mm nodular enhancing focus along the anterior falx, which may represent a tiny meningioma. 3. No new enhancing lesions. Electronically Signed   By: Merilyn Baba M.D.   On: 09/22/2022 19:32     [place] Clinician Interpretation: I have personally reviewed the radiological images as listed.  My interpretation, in the context of the patient's clinical presentation, is progressive disease   Assessment/Plan Brain Metastasis  TAL KEMPKER is clinically stable today.  MRI again demonstrates subtle progression of foci of enhancement adjacent to the resection cavity over past 4 months.  Increased suspicion for residual or refractory melanoma based on persistent growth rate >1 year.  It is too small to biopsy, and is not causing symptoms.  We discussed and recommended one additional follow up MRI study in 4 months.  If this again re-demonstrates progression, will recommend  biopsy and/or salvage SRS.    We again encouraged him to remain active and continue aerobic exercise regimen.  We appreciate the opportunity to participate in the care of SABATINO WILLIARD.   We ask that [name] return to clinic in 4 months following next brain MRI, or sooner as needed.  All questions were answered. The patient knows to call the clinic with any problems, questions or concerns. No barriers to learning were detected.  The total time spent in the encounter was 30 minutes and more than 50% was on counseling and review of test results   Ventura Sellers, MD Medical Director of Neuro-Oncology [facility] at [place] 09/25/22 10:24 AM

## 2022-10-02 ENCOUNTER — Other Ambulatory Visit: Payer: Self-pay | Admitting: Radiation Therapy

## 2022-10-09 DIAGNOSIS — Z1211 Encounter for screening for malignant neoplasm of colon: Secondary | ICD-10-CM | POA: Diagnosis not present

## 2022-10-09 DIAGNOSIS — Z2821 Immunization not carried out because of patient refusal: Secondary | ICD-10-CM | POA: Diagnosis not present

## 2022-10-09 DIAGNOSIS — Z0000 Encounter for general adult medical examination without abnormal findings: Secondary | ICD-10-CM | POA: Diagnosis not present

## 2022-10-09 DIAGNOSIS — Z136 Encounter for screening for cardiovascular disorders: Secondary | ICD-10-CM | POA: Diagnosis not present

## 2022-10-09 DIAGNOSIS — E785 Hyperlipidemia, unspecified: Secondary | ICD-10-CM | POA: Diagnosis not present

## 2022-10-09 DIAGNOSIS — Z13220 Encounter for screening for lipoid disorders: Secondary | ICD-10-CM | POA: Diagnosis not present

## 2022-11-15 DIAGNOSIS — C7931 Secondary malignant neoplasm of brain: Secondary | ICD-10-CM | POA: Diagnosis not present

## 2022-11-15 DIAGNOSIS — L281 Prurigo nodularis: Secondary | ICD-10-CM | POA: Diagnosis not present

## 2022-11-15 DIAGNOSIS — D229 Melanocytic nevi, unspecified: Secondary | ICD-10-CM | POA: Diagnosis not present

## 2022-11-15 DIAGNOSIS — Z85828 Personal history of other malignant neoplasm of skin: Secondary | ICD-10-CM | POA: Diagnosis not present

## 2022-11-15 DIAGNOSIS — L578 Other skin changes due to chronic exposure to nonionizing radiation: Secondary | ICD-10-CM | POA: Diagnosis not present

## 2022-11-15 DIAGNOSIS — D2230 Melanocytic nevi of unspecified part of face: Secondary | ICD-10-CM | POA: Diagnosis not present

## 2022-11-15 DIAGNOSIS — L570 Actinic keratosis: Secondary | ICD-10-CM | POA: Diagnosis not present

## 2022-11-15 DIAGNOSIS — L738 Other specified follicular disorders: Secondary | ICD-10-CM | POA: Diagnosis not present

## 2022-11-15 DIAGNOSIS — D485 Neoplasm of uncertain behavior of skin: Secondary | ICD-10-CM | POA: Diagnosis not present

## 2022-11-15 DIAGNOSIS — Z85820 Personal history of malignant melanoma of skin: Secondary | ICD-10-CM | POA: Diagnosis not present

## 2022-11-15 DIAGNOSIS — L814 Other melanin hyperpigmentation: Secondary | ICD-10-CM | POA: Diagnosis not present

## 2022-11-28 ENCOUNTER — Ambulatory Visit (HOSPITAL_COMMUNITY)
Admission: RE | Admit: 2022-11-28 | Discharge: 2022-11-28 | Disposition: A | Discharge status: Home / Self Care | Payer: Medicare Other | Source: Ambulatory Visit | Attending: Oncology | Admitting: Oncology

## 2022-11-28 ENCOUNTER — Inpatient Hospital Stay: Payer: Medicare Other | Attending: Oncology

## 2022-11-28 DIAGNOSIS — E041 Nontoxic single thyroid nodule: Secondary | ICD-10-CM | POA: Diagnosis not present

## 2022-11-28 DIAGNOSIS — J9809 Other diseases of bronchus, not elsewhere classified: Secondary | ICD-10-CM | POA: Diagnosis not present

## 2022-11-28 DIAGNOSIS — C7931 Secondary malignant neoplasm of brain: Secondary | ICD-10-CM | POA: Diagnosis not present

## 2022-11-28 DIAGNOSIS — N400 Enlarged prostate without lower urinary tract symptoms: Secondary | ICD-10-CM | POA: Insufficient documentation

## 2022-11-28 DIAGNOSIS — C439 Malignant melanoma of skin, unspecified: Secondary | ICD-10-CM | POA: Insufficient documentation

## 2022-11-28 DIAGNOSIS — C4359 Malignant melanoma of other part of trunk: Secondary | ICD-10-CM | POA: Insufficient documentation

## 2022-11-28 DIAGNOSIS — Z923 Personal history of irradiation: Secondary | ICD-10-CM | POA: Diagnosis not present

## 2022-11-28 DIAGNOSIS — M4312 Spondylolisthesis, cervical region: Secondary | ICD-10-CM | POA: Diagnosis not present

## 2022-11-28 DIAGNOSIS — I6522 Occlusion and stenosis of left carotid artery: Secondary | ICD-10-CM | POA: Diagnosis not present

## 2022-11-28 DIAGNOSIS — N281 Cyst of kidney, acquired: Secondary | ICD-10-CM | POA: Diagnosis not present

## 2022-11-28 LAB — CMP (CANCER CENTER ONLY)
ALT: 13 U/L (ref 0–44)
AST: 17 U/L (ref 15–41)
Albumin: 4.5 g/dL (ref 3.5–5.0)
Alkaline Phosphatase: 58 U/L (ref 38–126)
Anion gap: 6 (ref 5–15)
BUN: 20 mg/dL (ref 8–23)
CO2: 31 mmol/L (ref 22–32)
Calcium: 9.9 mg/dL (ref 8.9–10.3)
Chloride: 103 mmol/L (ref 98–111)
Creatinine: 1.12 mg/dL (ref 0.61–1.24)
GFR, Estimated: >60 mL/min (ref >60)
Glucose, Bld: 95 mg/dL (ref 70–99)
Potassium: 3.9 mmol/L (ref 3.5–5.1)
Sodium: 140 mmol/L (ref 135–145)
Total Bilirubin: 0.6 mg/dL (ref 0.3–1.2)
Total Protein: 7.4 g/dL (ref 6.5–8.1)

## 2022-11-28 LAB — CBC WITH DIFFERENTIAL (CANCER CENTER ONLY)
Abs Immature Granulocytes: 0.02 10*3/uL (ref 0.00–0.07)
Basophils Absolute: 0.1 10*3/uL (ref 0.0–0.1)
Basophils Relative: 1 %
Eosinophils Absolute: 0.3 10*3/uL (ref 0.0–0.5)
Eosinophils Relative: 3 %
HCT: 46.6 % (ref 39.0–52.0)
Hemoglobin: 15.6 g/dL (ref 13.0–17.0)
Immature Granulocytes: 0 %
Lymphocytes Relative: 23 %
Lymphs Abs: 2 10*3/uL (ref 0.7–4.0)
MCH: 28.6 pg (ref 26.0–34.0)
MCHC: 33.5 g/dL (ref 30.0–36.0)
MCV: 85.5 fL (ref 80.0–100.0)
Monocytes Absolute: 0.5 10*3/uL (ref 0.1–1.0)
Monocytes Relative: 6 %
Neutro Abs: 5.7 10*3/uL (ref 1.7–7.7)
Neutrophils Relative %: 67 %
Platelet Count: 213 10*3/uL (ref 150–400)
RBC: 5.45 MIL/uL (ref 4.22–5.81)
RDW: 13.2 % (ref 11.5–15.5)
WBC Count: 8.5 10*3/uL (ref 4.0–10.5)
nRBC: 0 % (ref 0.0–0.2)

## 2022-11-28 LAB — TSH: TSH: 3.165 u[IU]/mL (ref 0.350–4.500)

## 2022-11-28 MED ORDER — SODIUM CHLORIDE (PF) 0.9 % IJ SOLN
INTRAMUSCULAR | Status: AC
  Filled 2022-11-28: qty 50

## 2022-11-28 MED ORDER — IOHEXOL 300 MG/ML  SOLN
100.0000 mL | Freq: Once | INTRAMUSCULAR | Status: AC | PRN
  Administered 2022-11-28: 100 mL via INTRAVENOUS

## 2022-12-05 ENCOUNTER — Inpatient Hospital Stay: Payer: Medicare Other | Admitting: Oncology

## 2022-12-05 VITALS — BP 142/78 | HR 62 | Temp 97.9°F | Resp 17 | Ht 69.0 in | Wt 165.7 lb

## 2022-12-05 DIAGNOSIS — Z923 Personal history of irradiation: Secondary | ICD-10-CM | POA: Diagnosis not present

## 2022-12-05 DIAGNOSIS — C439 Malignant melanoma of skin, unspecified: Secondary | ICD-10-CM | POA: Diagnosis not present

## 2022-12-05 DIAGNOSIS — E041 Nontoxic single thyroid nodule: Secondary | ICD-10-CM | POA: Diagnosis not present

## 2022-12-05 DIAGNOSIS — C7931 Secondary malignant neoplasm of brain: Secondary | ICD-10-CM | POA: Diagnosis not present

## 2022-12-05 DIAGNOSIS — C4359 Malignant melanoma of other part of trunk: Secondary | ICD-10-CM | POA: Diagnosis not present

## 2022-12-05 NOTE — Progress Notes (Signed)
Hematology and Oncology Follow Up Visit  [name] [id number] 06/17/1956 66 y.o. 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  Prior Therapy:  He is status post local wide excision as well as right axillary sentinel lymph node sampling completed by Dr. Barry Dienes on April 01, 2019.  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 Iran Planas on Apr 10, 2019.  He is status post right craniotomy and excision of brain tumor completed on August 05, 2020 by Dr. Marcello Moores.  He status post radiation therapy following his craniotomy completed on August 25, 2020.  He received 27 Gy in 3 fractions utilizing stereotactic radiosurgery.  Nivolumab 480 mg every 4 weeks started on September 22, 2020.  He completed 12 cycles of therapy in August 2022.   Current therapy: Active surveillance.  Interim History: Mr. Walter Rodriguez returns today for a follow-up evaluation.  Since last visit, he reports no major changes in his health.  He denies any headaches blurry vision or excessive fatigue or tiredness.  He denies any skin rashes or lesions.  His performance status quality of life remains unchanged.    Medications: Reviewed without changes. Current Outpatient Medications  Medication Sig Dispense Refill   Multiple Vitamin (MULTI-VITAMIN) tablet Take 1 tablet by mouth daily.     No current facility-administered medications for this visit.     Allergies: No Known Allergies      Physical Exam:         Blood pressure (!) 142/78, pulse 62, temperature 97.9 F (36.6 C), temperature source Temporal, resp. rate 17, height _0  (1.753 m), weight 165 lb 11.2 oz (75.2 kg), SpO2 99 %.    ECOG: 0    General  appearance: Comfortable appearing without any discomfort Head: Normocephalic without any trauma Oropharynx: Mucous membranes are moist and pink without any thrush or ulcers. Eyes: Pupils are equal and round reactive to light. Lymph nodes: No cervical, supraclavicular, inguinal or axillary lymphadenopathy.   Heart:regular rate and rhythm.  S1 and S2 without leg edema. Lung: Clear without any rhonchi or wheezes.  No dullness to percussion. Abdomin: Soft, nontender, nondistended with good bowel sounds.  No hepatosplenomegaly. Musculoskeletal: No joint deformity or effusion.  Full range of motion noted. Neurological: No deficits noted on motor, sensory and deep tendon reflex exam. Skin: Erythema noted bilaterally on his arms.                Lab Results: Lab Results  Component Value Date   WBC 8.5 11/28/2022   HGB 15.6 11/28/2022   HCT 46.6 11/28/2022   MCV 85.5 11/28/2022   PLT 213 11/28/2022     Chemistry      Component Value Date/Time   NA 140 11/28/2022 1041   K 3.9 11/28/2022 1041   CL 103 11/28/2022 1041   CO2 31 11/28/2022 1041   BUN 20 11/28/2022 1041   CREATININE 1.12 11/28/2022 1041   CREATININE 1.19 07/27/2020 0836      Component Value Date/Time   CALCIUM 9.9 11/28/2022 1041   ALKPHOS 58 11/28/2022 1041   AST 17 11/28/2022 1041   ALT 13 11/28/2022 1041   BILITOT 0.6 11/28/2022 1041           IMPRESSION:  1. Further slight increase in the size of the nodular enhancing focus along the medial aspect of the right parietal resection cavity which remains concerning for tumor, given progressive growth over several scans. Continued attention on follow-up is recommended. 2. Unchanged 3 mm nodular enhancing focus along the anterior falx, which may represent a tiny meningioma. 3. No new enhancing lesions  IMPRESSION: 1. No evidence of metastatic disease in the chest, abdomen, or pelvis. 2. Unchanged hypodense subcapsular lesion of the posterior  liver dome, hepatic segment VII, as previously reported, better characterized by prior MR and remains most likely a small, benign hemangioma. Continued attention on follow-up. 3. Prostatomegaly. 4. Mild, diffuse bilateral bronchial wall thickening, consistent with nonspecific infectious or inflammatory bronchitis.  66 year old with:  1.  Melanoma of the upper back diagnosed in 2020 with stage IIc disease.  He developed stage IV with CNS metastasis in 2021.  The natural course of this disease was reviewed and treatment options were discussed.  He had completed 1 year of adjuvant immunotherapy for stage IV NED disease and his CT scan was updated.  His CT on November 28, 2022 did not show any evidence of systemic relapse.  Different salvage therapy options including BRAF targeted therapy, combination immunotherapy with ipilimumab and nivolumab would be considered if he has relapsed disease.  At this time I recommended monitoring with repeat imaging studies every 6 months at least for the next year and possibly annually after that.   2.  CNS metastasis: Remains on active surveillance without any additional treatment.  MRI of the brain in October 2023 showed area for potential slight progression he is currently followed by Dr. [name].  Possible salvage SRS could be considered in the future.  3.  Dermatology surveillance: He is up-to-date at this time.  He is applying 5-FU cream which has caused some erythematous reaction.  4.  Thyroid nodule: Ultrasound obtained on June 30 did not show any evidence to suggest malignancy.  5.  Follow-up: In 6 months for a follow-up evaluation.   30  minutes were dedicated to this encounter.  The time was spent on updating disease status, reviewing imaging studies and different salvage therapy options in the future.      Zola Button, MD 12/27/20238:59 AM

## 2023-01-17 ENCOUNTER — Ambulatory Visit
Admission: RE | Admit: 2023-01-17 | Discharge: 2023-01-17 | Disposition: A | Discharge status: Home / Self Care | Payer: Medicare Other | Source: Ambulatory Visit | Attending: Internal Medicine | Admitting: Internal Medicine

## 2023-01-17 DIAGNOSIS — C439 Malignant melanoma of skin, unspecified: Secondary | ICD-10-CM | POA: Diagnosis not present

## 2023-01-17 DIAGNOSIS — C7931 Secondary malignant neoplasm of brain: Secondary | ICD-10-CM

## 2023-01-17 MED ORDER — GADOPICLENOL 0.5 MMOL/ML IV SOLN
7.5000 mL | Freq: Once | INTRAVENOUS | Status: AC | PRN
  Administered 2023-01-17: 7.5 mL via INTRAVENOUS

## 2023-01-21 ENCOUNTER — Inpatient Hospital Stay: Payer: Medicare Other | Attending: Oncology

## 2023-01-21 DIAGNOSIS — C7931 Secondary malignant neoplasm of brain: Secondary | ICD-10-CM | POA: Insufficient documentation

## 2023-01-21 DIAGNOSIS — C4359 Malignant melanoma of other part of trunk: Secondary | ICD-10-CM | POA: Insufficient documentation

## 2023-01-22 ENCOUNTER — Other Ambulatory Visit: Payer: Self-pay | Admitting: Radiation Therapy

## 2023-01-22 ENCOUNTER — Inpatient Hospital Stay: Payer: Medicare Other | Admitting: Internal Medicine

## 2023-01-22 VITALS — BP 145/72 | HR 64 | Temp 97.9°F | Resp 20 | Wt 164.9 lb

## 2023-01-22 DIAGNOSIS — C7931 Secondary malignant neoplasm of brain: Secondary | ICD-10-CM

## 2023-01-22 DIAGNOSIS — C4359 Malignant melanoma of other part of trunk: Secondary | ICD-10-CM | POA: Diagnosis not present

## 2023-01-22 NOTE — Progress Notes (Signed)
[facility] [place], [place] [address] [phone]   Interval Evaluation  Date of Service: 01/22/23 Patient Name: Walter Rodriguez Patient MRN: [id number] Patient DOB: December 10, 1956 Provider: Ventura Sellers, MD  Identifying Statement:  Walter Rodriguez is a 67 y.o. male with Metastasis to brain [facility]) [C79.31]   Primary Cancer:  Oncologic History: Oncology History  Melanoma of skin ([place])  09/07/2020 Initial Diagnosis   Melanoma of skin ([place])   09/22/2020 - 07/28/2021 Chemotherapy   Patient is on Treatment Plan : LUNG Nivolumab q28d     07/28/2021 Cancer Staging   Staging form: Melanoma of the Skin, AJCC 8th Edition - Clinical: Stage IV (cTX, cNX, pM1a(1)) - Signed by Wyatt Portela, MD on 07/28/2021   Metastasis to brain [facility])  08/05/2020 Surgery   Craniotomy, resection of 4.7cm R parietal mass with Dr. Marcello Moores; path c/w melanoma    - 08/31/2020 Radiation Therapy   Completes 27Gy/3f to resection cavity with Dr. MLisbeth Renshaw    Interval History:  Walter Prophetpresents today for follow up after recent MRI brain.  Reports no clinical changes today.  Continues on surveillance for melanoma.  Cycling or walking every day.    H+P (11/29/20) Patient presented to clinical attention in August 2021 with several weeks history of left arm and leg weakness.  He experienced a fall at home and was unable to get up, which prompted evaluation and CNS imaging.  MRI demonstrated large enhancing mass within right parietal lobe consistent with metastatic disease.  He underwent craniotomy and gross resection with Dr. TMarcello Mooreson 08/05/20, which was followed by post-op SRS in 3 fractions with Dr. MLisbeth Renshaw completed on 08/31/20.  He has completed decadron taper.  At present time he has no motor complaints and no subjective neurologic deficits.  Is undergoing rx with Nivolumab with Dr. SAlen Blew  Very active at home, walks and cycles every day.  Medications: Current  Outpatient Medications on File Prior to Visit  Medication Sig Dispense Refill   Multiple Vitamin (MULTI-VITAMIN) tablet Take 1 tablet by mouth daily.     No current facility-administered medications on file prior to visit.    Allergies: No Known Allergies Past Medical History:  Past Medical History:  Diagnosis Date   Melanoma ([facility]    back   Open wound    back   Past Surgical History:  Past Surgical History:  Procedure Laterality Date   APPLICATION OF CRANIAL NAVIGATION N/A 08/05/2020   Procedure: APPLICATION OF CRANIAL NAVIGATION;  Surgeon: TVallarie Mare MD;  [place]  Service: Neurosurgery;  Laterality: N/A;   CRANIOTOMY N/A 08/05/2020   Procedure: CRANIOTOMY TUMOR EXCISION;  Surgeon: TVallarie Mare MD;  [place]  Service: Neurosurgery;  Laterality: N/A;   MELANOMA EXCISION WITH SENTINEL LYMPH NODE BIOPSY N/A 04/01/2019   Procedure: WIDE LOCAL EXCISION OF BACK MELANOMA WITH RIGHT AXILLARY SENTINEL LYMPH NODE BIOPSY;  Surgeon: BStark Klein MD;  [place]  Service: General;  Laterality: N/A;   SKIN SPLIT GRAFT N/A 04/10/2019   Procedure: SPLIT THICKNESS SKIN GRAFT FROM RIGHT  THIGH TO THE BACK;  Surgeon: TIrene Limbo MD;  [place]  Service: Plastics;  Laterality: N/A;   Social History:  Social History   Socioeconomic History   Marital status: Single    Spouse name: Not on file   Number of children: Not on file   Years of education: Not on file  Highest education level: Not on file  Occupational History   Not on file  Tobacco Use   Smoking status: Never   Smokeless tobacco: Never  Vaping Use   Vaping Use: Never used  Substance and Sexual Activity   Alcohol use: Never   Drug use: Never   Sexual activity: Not on file  Other Topics Concern   Not on file  Social History Narrative   Not on file   Social Determinants of Health   Financial Resource Strain: Not on file  Food Insecurity: Not on file  Transportation Needs: No  Transportation Needs (09/06/2022)   PRAPARE - Transportation    Lack of Transportation (Medical): No    Lack of Transportation (Non-Medical): No  Physical Activity: Not on file  Stress: Not on file  Social Connections: Not on file  Intimate Partner Violence: Not on file   Family History: No family history on file.  Review of Systems: Constitutional: Doesn't report fevers, chills or abnormal weight loss Eyes: Doesn't report blurriness of vision Ears, nose, mouth, throat, and face: Doesn't report sore throat Respiratory: Doesn't report cough, dyspnea or wheezes Cardiovascular: Doesn't report palpitation, chest discomfort  Gastrointestinal:  Doesn't report nausea, constipation, diarrhea GU: Doesn't report incontinence Skin: Doesn't report skin rashes Neurological: Per HPI Musculoskeletal: Doesn't report joint pain Behavioral/Psych: Doesn't report anxiety  Physical Exam: Wt Readings from Last 3 Encounters:  12/05/22 165 lb 11.2 oz (75.2 kg)  09/25/22 165 lb 11.2 oz (75.2 kg)  05/29/22 170 lb (77.1 kg)   Temp Readings from Last 3 Encounters:  12/05/22 97.9 F (36.6 C) (Temporal)  09/25/22 98.2 F (36.8 C) (Temporal)  05/29/22 97.7 F (36.5 C) (Temporal)   BP Readings from Last 3 Encounters:  12/05/22 (!) 142/78  09/25/22 (!) 147/75  05/29/22 (!) 149/78   Pulse Readings from Last 3 Encounters:  12/05/22 62  09/25/22 60  05/29/22 (!) 55    KPS: 90. General: Alert, cooperative, pleasant, in no acute distress Head: Normal EENT: No conjunctival injection or scleral icterus.  Lungs: Resp effort normal Cardiac: Regular rate Abdomen: Non-distended abdomen Skin: No rashes cyanosis or petechiae. Extremities: No clubbing or edema  Neurologic Exam: Mental Status: Awake, alert, attentive to examiner. Oriented to self and environment. Language is fluent with intact comprehension.  Cranial Nerves: Visual acuity is grossly normal. Visual fields are full. Extra-ocular movements  intact. No ptosis. Face is symmetric Motor: Tone and bulk are normal. Power is full in both arms and legs. Reflexes are symmetric, no pathologic reflexes present.  Sensory: Intact to light touch Gait: Normal.   Labs: I have reviewed the data as listed    Component Value Date/Time   NA 140 11/28/2022 1041   K 3.9 11/28/2022 1041   CL 103 11/28/2022 1041   CO2 31 11/28/2022 1041   GLUCOSE 95 11/28/2022 1041   BUN 20 11/28/2022 1041   CREATININE 1.12 11/28/2022 1041   CREATININE 1.19 07/27/2020 0836   CALCIUM 9.9 11/28/2022 1041   PROT 7.4 11/28/2022 1041   ALBUMIN 4.5 11/28/2022 1041   AST 17 11/28/2022 1041   ALT 13 11/28/2022 1041   ALKPHOS 58 11/28/2022 1041   BILITOT 0.6 11/28/2022 1041   GFRNONAA >60 11/28/2022 1041   GFRNONAA 63 07/27/2020 0836   GFRAA >60 08/07/2020 0429   GFRAA 73 07/27/2020 0836   Lab Results  Component Value Date   WBC 8.5 11/28/2022   NEUTROABS 5.7 11/28/2022   HGB 15.6 11/28/2022   HCT 46.6 11/28/2022  MCV 85.5 11/28/2022   PLT 213 11/28/2022    Imaging:  MR BRAIN W WO CONTRAST  Result Date: 01/19/2023 CLINICAL DATA:  Follow-up metastatic melanoma. EXAM: MRI HEAD WITHOUT AND WITH CONTRAST TECHNIQUE: Multiplanar, multiecho pulse sequences of the brain and surrounding structures were obtained without and with intravenous contrast. CONTRAST:  7.5 mL Vueway COMPARISON:  Head MRI 09/21/2022 FINDINGS: Brain: A resection cavity with chronic hemosiderin staining is again noted in the right parietal lobe. Small nodular foci of enhancement along the margins of the cavity demonstrate further mild interval progression. The most significant interval change is enlargement and a more confluent/solid appearance of nodular enhancement posterior to the cavity now measuring 6 x 4 mm (series 14, image 105, previously 5 x 1.5 mm). The dominant focus of nodular enhancement along the medial aspect of the cavity has only subtly increased in size, now measuring 9 x 4 mm  (series 14, image 105, previously 8 x 4 mm). A 1-2 mm punctate focus of enhancement along the superior aspect of the cavity is new (series 14, image 116). Mild T2 hyperintensity in the surrounding white matter is unchanged without mass effect. No new enhancing brain lesions are identified remote from the right parietal cavity. No acute infarct, midline shift, or extra-axial fluid collection is evident. The ventricles are normal in size. There is an unchanged chronic microhemorrhage in the anterior left frontal lobe. A 3 mm rounded focus of enhancement along the anterior falx is unchanged (series 13, image 107). Vascular: Major intracranial vascular flow voids are preserved. Skull and upper cervical spine: Right parietal craniotomy. Unchanged 7 mm enhancing lesion in the left parietal bone. Sinuses/Orbits: Unremarkable orbits. Mild mucosal thickening in the paranasal sinuses. No significant mastoid fluid. Other: None. IMPRESSION: 1. Further mild progression of small foci of nodular enhancement along the margins of the right parietal resection cavity. Unchanged mild surrounding edema/gliosis. 2. No evidence of new intracranial metastases remote from the resection site. 3. Unchanged 3 mm focus of enhancement along the anterior falx which may reflect a tiny meningioma. Electronically Signed   By: Logan Bores M.D.   On: 01/19/2023 11:23     [facility] Clinician Interpretation: I have personally reviewed the radiological images as listed.  My interpretation, in the context of the patient's clinical presentation, is treatment effect vs true progression   Assessment/Plan Brain Metastasis  Walter Rodriguez is clinically stable today.  MRI again demonstrates modest progression of foci of enhancement posterior to the resection cavity since prior study.  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     We discussed and recommended one additional follow up MRI study in 3 months.  If this  again re-demonstrates progression, will discuss salvage SRS.    We again encouraged him to remain active and continue aerobic exercise regimen.  We appreciate the opportunity to participate in the care of Walter Rodriguez.   We ask that Walter Rodriguez return to clinic in 3 months following next brain MRI, or sooner as needed.  All questions were answered. The patient knows to call the clinic with any problems, questions or concerns. No barriers to learning were detected.  The total time spent in the encounter was 30 minutes and more than 50% was on counseling and review of test results   Ventura Sellers, MD Medical Director of Neuro-Oncology [facility] at [place] 01/22/23 11:01 AM

## 2023-03-04 DIAGNOSIS — L821 Other seborrheic keratosis: Secondary | ICD-10-CM | POA: Diagnosis not present

## 2023-03-04 DIAGNOSIS — L578 Other skin changes due to chronic exposure to nonionizing radiation: Secondary | ICD-10-CM | POA: Diagnosis not present

## 2023-03-04 DIAGNOSIS — D1801 Hemangioma of skin and subcutaneous tissue: Secondary | ICD-10-CM | POA: Diagnosis not present

## 2023-03-04 DIAGNOSIS — D229 Melanocytic nevi, unspecified: Secondary | ICD-10-CM | POA: Diagnosis not present

## 2023-03-04 DIAGNOSIS — L570 Actinic keratosis: Secondary | ICD-10-CM | POA: Diagnosis not present

## 2023-03-04 DIAGNOSIS — C7931 Secondary malignant neoplasm of brain: Secondary | ICD-10-CM | POA: Diagnosis not present

## 2023-03-04 DIAGNOSIS — L814 Other melanin hyperpigmentation: Secondary | ICD-10-CM | POA: Diagnosis not present

## 2023-03-04 DIAGNOSIS — Z85828 Personal history of other malignant neoplasm of skin: Secondary | ICD-10-CM | POA: Diagnosis not present

## 2023-03-04 DIAGNOSIS — Z85820 Personal history of malignant melanoma of skin: Secondary | ICD-10-CM | POA: Diagnosis not present

## 2023-04-17 ENCOUNTER — Telehealth: Payer: Self-pay | Admitting: Internal Medicine

## 2023-04-17 NOTE — Telephone Encounter (Signed)
Called patient regarding upcoming May appointments, patient is notified.  ?

## 2023-04-25 ENCOUNTER — Ambulatory Visit
Admission: RE | Admit: 2023-04-25 | Discharge: 2023-04-25 | Disposition: A | Discharge status: Home / Self Care | Payer: Medicare Other | Source: Ambulatory Visit | Attending: Internal Medicine | Admitting: Internal Medicine

## 2023-04-25 DIAGNOSIS — C7931 Secondary malignant neoplasm of brain: Secondary | ICD-10-CM | POA: Diagnosis not present

## 2023-04-25 MED ORDER — GADOPICLENOL 0.5 MMOL/ML IV SOLN
7.0000 mL | Freq: Once | INTRAVENOUS | Status: AC | PRN
  Administered 2023-04-25: 7 mL via INTRAVENOUS

## 2023-04-29 ENCOUNTER — Inpatient Hospital Stay: Payer: Medicare Other | Attending: Oncology

## 2023-04-29 DIAGNOSIS — C7931 Secondary malignant neoplasm of brain: Secondary | ICD-10-CM | POA: Insufficient documentation

## 2023-04-29 DIAGNOSIS — C4359 Malignant melanoma of other part of trunk: Secondary | ICD-10-CM | POA: Insufficient documentation

## 2023-04-29 DIAGNOSIS — Z9221 Personal history of antineoplastic chemotherapy: Secondary | ICD-10-CM | POA: Insufficient documentation

## 2023-04-30 ENCOUNTER — Inpatient Hospital Stay: Payer: Medicare Other | Admitting: Internal Medicine

## 2023-04-30 VITALS — BP 139/70 | HR 61 | Temp 98.1°F | Resp 18 | Ht 69.0 in | Wt 166.8 lb

## 2023-04-30 DIAGNOSIS — C7931 Secondary malignant neoplasm of brain: Secondary | ICD-10-CM | POA: Diagnosis not present

## 2023-04-30 DIAGNOSIS — C4359 Malignant melanoma of other part of trunk: Secondary | ICD-10-CM | POA: Diagnosis not present

## 2023-04-30 DIAGNOSIS — Z9221 Personal history of antineoplastic chemotherapy: Secondary | ICD-10-CM | POA: Diagnosis not present

## 2023-04-30 NOTE — Progress Notes (Signed)
[facility] at [facility] [address]  [place], [place] [address] [phone]   Interval Evaluation  Date of Service: 04/30/23 Patient Name: Walter Rodriguez Patient MRN: [id number] Patient DOB: August 13, 1956 Provider: Henreitta Leber, MD  Identifying Statement:  Walter Rodriguez is a 67 y.o. male with Metastasis to brain [facility]) [C79.31]   Primary Cancer:  Oncologic History: Oncology History  Melanoma of skin ([facility])  09/07/2020 Initial Diagnosis   Melanoma of skin ([facility])   09/22/2020 - 07/28/2021 Chemotherapy   Patient is on Treatment Plan : LUNG Nivolumab q28d     07/28/2021 Cancer Staging   Staging form: Melanoma of the Skin, AJCC 8th Edition - Clinical: Stage IV (cTX, cNX, pM1a(1)) - Signed by Benjiman Core, MD on 07/28/2021   Metastasis to brain [facility])  08/05/2020 Surgery   Craniotomy, resection of 4.7cm R parietal mass with Dr. Maisie Fus; path c/w melanoma    - 08/31/2020 Radiation Therapy   Completes 27Gy/46fx to resection cavity with Dr. Mitzi Hansen     Interval History:  Walter Rodriguez presents today for follow up after recent MRI brain.  Reports no clinical changes today.  Continues on surveillance for melanoma.  Maintains aerobic exercise regimen, walking and cycling.  H+P (11/29/20) Patient presented to clinical attention in August 2021 with several weeks history of left arm and leg weakness.  He experienced a fall at home and was unable to get up, which prompted evaluation and CNS imaging.  MRI demonstrated large enhancing mass within right parietal lobe consistent with metastatic disease.  He underwent craniotomy and gross resection with Dr. Maisie Fus on 08/05/20, which was followed by post-op SRS in 3 fractions with Dr. Mitzi Hansen, completed on 08/31/20.  He has completed decadron taper.  At present time he has no motor complaints and no subjective neurologic deficits.  Is undergoing rx with Nivolumab with Dr. Clelia Croft.  Very active at home, walks and cycles every  day.  Medications: Current Outpatient Medications on File Prior to Visit  Medication Sig Dispense Refill   Multiple Vitamin (MULTI-VITAMIN) tablet Take 1 tablet by mouth daily.     No current facility-administered medications on file prior to visit.    Allergies: No Known Allergies Past Medical History:  Past Medical History:  Diagnosis Date   Melanoma ([facility])    back   Open wound    back   Past Surgical History:  Past Surgical History:  Procedure Laterality Date   APPLICATION OF CRANIAL NAVIGATION N/A 08/05/2020   Procedure: APPLICATION OF CRANIAL NAVIGATION;  Surgeon: Bedelia Person, MD;  Location: [facility] OR;  Service: Neurosurgery;  Laterality: N/A;   CRANIOTOMY N/A 08/05/2020   Procedure: CRANIOTOMY TUMOR EXCISION;  Surgeon: Bedelia Person, MD;  Location[facility];  Service: Neurosurgery;  Laterality: N/A;   MELANOMA EXCISION WITH SENTINEL LYMPH NODE BIOPSY N/A 04/01/2019   Procedure: WIDE LOCAL EXCISION OF BACK MELANOMA WITH RIGHT AXILLARY SENTINEL LYMPH NODE BIOPSY;  Surgeon: Almond Lint, MD;  [facility];  Service: General;  Laterality: N/A;   SKIN SPLIT GRAFT N/A 04/10/2019   Procedure: SPLIT THICKNESS SKIN GRAFT FROM RIGHT  THIGH TO THE BACK;  Surgeon: Glenna Fellows, MD;  Location: [facility] OR;  Service: Plastics;  Laterality: N/A;   Social History:  Social History   Socioeconomic History   Marital status: Single    Spouse name: Not on file   Number of children: Not on file   Years of education: Not on file  Highest education level: Not on file  Occupational History   Not on file  Tobacco Use   Smoking status: Never   Smokeless tobacco: Never  Vaping Use   Vaping Use: Never used  Substance and Sexual Activity   Alcohol use: Never   Drug use: Never   Sexual activity: Not on file  Other Topics Concern   Not on file  Social History Narrative   Not on file   Social Determinants of Health   Financial Resource Strain: Not on file  Food Insecurity: Not on  file  Transportation Needs: No Transportation Needs (09/06/2022)   PRAPARE - Transportation    Lack of Transportation (Medical): No    Lack of Transportation (Non-Medical): No  Physical Activity: Not on file  Stress: Not on file  Social Connections: Not on file  Intimate Partner Violence: Not on file   Family History: No family history on file.  Review of Systems: Constitutional: Doesn't report fevers, chills or abnormal weight loss Eyes: Doesn't report blurriness of vision Ears, nose, mouth, throat, and face: Doesn't report sore throat Respiratory: Doesn't report cough, dyspnea or wheezes Cardiovascular: Doesn't report palpitation, chest discomfort  Gastrointestinal:  Doesn't report nausea, constipation, diarrhea GU: Doesn't report incontinence Skin: Doesn't report skin rashes Neurological: Per HPI Musculoskeletal: Doesn't report joint pain Behavioral/Psych: Doesn't report anxiety  Physical Exam: Wt Readings from Last 3 Encounters:  04/30/23 166 lb 12.8 oz (75.7 kg)  01/22/23 164 lb 14.4 oz (74.8 kg)  12/05/22 165 lb 11.2 oz (75.2 kg)   Temp Readings from Last 3 Encounters:  04/30/23 98.1 F (36.7 C) (Oral)  01/22/23 97.9 F (36.6 C)  12/05/22 97.9 F (36.6 C) (Temporal)   BP Readings from Last 3 Encounters:  04/30/23 139/70  01/22/23 (!) 145/72  12/05/22 (!) 142/78   Pulse Readings from Last 3 Encounters:  04/30/23 61  01/22/23 64  12/05/22 62    KPS: 90. General: Alert, cooperative, pleasant, in no acute distress Head: Normal EENT: No conjunctival injection or scleral icterus.  Lungs: Resp effort normal Cardiac: Regular rate Abdomen: Non-distended abdomen Skin: No rashes cyanosis or petechiae. Extremities: No clubbing or edema  Neurologic Exam: Mental Status: Awake, alert, attentive to examiner. Oriented to self and environment. Language is fluent with intact comprehension.  Cranial Nerves: Visual acuity is grossly normal. Visual fields are full.  Extra-ocular movements intact. No ptosis. Face is symmetric Motor: Tone and bulk are normal. Power is full in both arms and legs. Reflexes are symmetric, no pathologic reflexes present.  Sensory: Intact to light touch Gait: Normal.   Labs: I have reviewed the data as listed    Component Value Date/Time   NA 140 11/28/2022 1041   K 3.9 11/28/2022 1041   CL 103 11/28/2022 1041   CO2 31 11/28/2022 1041   GLUCOSE 95 11/28/2022 1041   BUN 20 11/28/2022 1041   CREATININE 1.12 11/28/2022 1041   CREATININE 1.19 07/27/2020 0836   CALCIUM 9.9 11/28/2022 1041   PROT 7.4 11/28/2022 1041   ALBUMIN 4.5 11/28/2022 1041   AST 17 11/28/2022 1041   ALT 13 11/28/2022 1041   ALKPHOS 58 11/28/2022 1041   BILITOT 0.6 11/28/2022 1041   GFRNONAA >60 11/28/2022 1041   GFRNONAA 63 07/27/2020 0836   GFRAA >60 08/07/2020 0429   GFRAA 73 07/27/2020 0836   Lab Results  Component Value Date   WBC 8.5 11/28/2022   NEUTROABS 5.7 11/28/2022   HGB 15.6 11/28/2022   HCT 46.6 11/28/2022  MCV 85.5 11/28/2022   PLT 213 11/28/2022    Imaging:  No results found.   [facility] Clinician Interpretation: I have personally reviewed the radiological images as listed.  My interpretation, in the context of the patient's clinical presentation, is stable disease pending official read   Assessment/Plan Brain Metastasis  Walter Rodriguez is clinically stable today.  MRI demonstrates stability or subtle increase in foci of enhancement posterior to the resection cavity since prior study.  This is an asymptomatic change.  We discussed and recommended further MRI surveillance.    We again encouraged him to remain active and continue aerobic exercise regimen.  We appreciate the opportunity to participate in the care of Walter Rodriguez.   We ask that Walter Rodriguez return to clinic in 6 months following next brain MRI, or sooner as needed.  All questions were answered. The patient knows to call the clinic with any  problems, questions or concerns. No barriers to learning were detected.  The total time spent in the encounter was 30 minutes and more than 50% was on counseling and review of test results   Henreitta Leber, MD Medical Director of Neuro-Oncology [facility] at [place] 04/30/23 11:41 AM

## 2023-05-07 ENCOUNTER — Other Ambulatory Visit: Payer: Self-pay | Admitting: Radiation Therapy

## 2023-05-27 ENCOUNTER — Other Ambulatory Visit: Payer: Self-pay | Admitting: *Deleted

## 2023-05-27 DIAGNOSIS — C7931 Secondary malignant neoplasm of brain: Secondary | ICD-10-CM

## 2023-05-28 ENCOUNTER — Encounter (HOSPITAL_COMMUNITY): Payer: Self-pay

## 2023-05-28 ENCOUNTER — Ambulatory Visit (HOSPITAL_COMMUNITY)
Admission: RE | Admit: 2023-05-28 | Discharge: 2023-05-28 | Disposition: A | Discharge status: Home / Self Care | Payer: Medicare Other | Source: Ambulatory Visit | Attending: Oncology | Admitting: Oncology

## 2023-05-28 ENCOUNTER — Other Ambulatory Visit: Payer: Self-pay

## 2023-05-28 ENCOUNTER — Inpatient Hospital Stay: Payer: Medicare Other | Attending: Oncology

## 2023-05-28 DIAGNOSIS — C439 Malignant melanoma of skin, unspecified: Secondary | ICD-10-CM

## 2023-05-28 DIAGNOSIS — J948 Other specified pleural conditions: Secondary | ICD-10-CM | POA: Diagnosis not present

## 2023-05-28 DIAGNOSIS — C4359 Malignant melanoma of other part of trunk: Secondary | ICD-10-CM | POA: Insufficient documentation

## 2023-05-28 DIAGNOSIS — I700 Atherosclerosis of aorta: Secondary | ICD-10-CM | POA: Insufficient documentation

## 2023-05-28 DIAGNOSIS — K314 Gastric diverticulum: Secondary | ICD-10-CM | POA: Insufficient documentation

## 2023-05-28 DIAGNOSIS — N400 Enlarged prostate without lower urinary tract symptoms: Secondary | ICD-10-CM | POA: Insufficient documentation

## 2023-05-28 DIAGNOSIS — C7931 Secondary malignant neoplasm of brain: Secondary | ICD-10-CM | POA: Insufficient documentation

## 2023-05-28 DIAGNOSIS — E041 Nontoxic single thyroid nodule: Secondary | ICD-10-CM | POA: Diagnosis not present

## 2023-05-28 DIAGNOSIS — N281 Cyst of kidney, acquired: Secondary | ICD-10-CM | POA: Diagnosis not present

## 2023-05-28 DIAGNOSIS — Z801 Family history of malignant neoplasm of trachea, bronchus and lung: Secondary | ICD-10-CM | POA: Insufficient documentation

## 2023-05-28 DIAGNOSIS — Z803 Family history of malignant neoplasm of breast: Secondary | ICD-10-CM | POA: Insufficient documentation

## 2023-05-28 LAB — CMP (CANCER CENTER ONLY)
ALT: 15 U/L (ref 0–44)
AST: 21 U/L (ref 15–41)
Albumin: 3.9 g/dL (ref 3.5–5.0)
Alkaline Phosphatase: 48 U/L (ref 38–126)
Anion gap: 6 (ref 5–15)
BUN: 26 mg/dL — ABNORMAL HIGH (ref 8–23)
CO2: 27 mmol/L (ref 22–32)
Calcium: 9.3 mg/dL (ref 8.9–10.3)
Chloride: 107 mmol/L (ref 98–111)
Creatinine: 1.1 mg/dL (ref 0.61–1.24)
GFR, Estimated: >60 mL/min (ref >60)
Glucose, Bld: 91 mg/dL (ref 70–99)
Potassium: 4.2 mmol/L (ref 3.5–5.1)
Sodium: 140 mmol/L (ref 135–145)
Total Bilirubin: 0.7 mg/dL (ref 0.3–1.2)
Total Protein: 6.1 g/dL — ABNORMAL LOW (ref 6.5–8.1)

## 2023-05-28 LAB — CBC WITH DIFFERENTIAL (CANCER CENTER ONLY)
Abs Immature Granulocytes: 0.02 10*3/uL (ref 0.00–0.07)
Basophils Absolute: 0 10*3/uL (ref 0.0–0.1)
Basophils Relative: 1 %
Eosinophils Absolute: 0.1 10*3/uL (ref 0.0–0.5)
Eosinophils Relative: 2 %
HCT: 43.1 % (ref 39.0–52.0)
Hemoglobin: 14.2 g/dL (ref 13.0–17.0)
Immature Granulocytes: 0 %
Lymphocytes Relative: 19 %
Lymphs Abs: 1.3 10*3/uL (ref 0.7–4.0)
MCH: 28.1 pg (ref 26.0–34.0)
MCHC: 32.9 g/dL (ref 30.0–36.0)
MCV: 85.3 fL (ref 80.0–100.0)
Monocytes Absolute: 0.4 10*3/uL (ref 0.1–1.0)
Monocytes Relative: 6 %
Neutro Abs: 4.8 10*3/uL (ref 1.7–7.7)
Neutrophils Relative %: 72 %
Platelet Count: 204 10*3/uL (ref 150–400)
RBC: 5.05 MIL/uL (ref 4.22–5.81)
RDW: 13.2 % (ref 11.5–15.5)
WBC Count: 6.6 10*3/uL (ref 4.0–10.5)
nRBC: 0 % (ref 0.0–0.2)

## 2023-05-28 MED ORDER — IOHEXOL 300 MG/ML  SOLN
100.0000 mL | Freq: Once | INTRAMUSCULAR | Status: AC | PRN
  Administered 2023-05-28: 100 mL via INTRAVENOUS

## 2023-06-04 ENCOUNTER — Other Ambulatory Visit: Payer: Self-pay

## 2023-06-04 ENCOUNTER — Inpatient Hospital Stay: Payer: Medicare Other | Admitting: Internal Medicine

## 2023-06-04 VITALS — BP 129/74 | HR 53 | Temp 97.7°F | Resp 17 | Ht 69.0 in | Wt 163.2 lb

## 2023-06-04 DIAGNOSIS — C439 Malignant melanoma of skin, unspecified: Secondary | ICD-10-CM

## 2023-06-04 DIAGNOSIS — Z801 Family history of malignant neoplasm of trachea, bronchus and lung: Secondary | ICD-10-CM | POA: Diagnosis not present

## 2023-06-04 DIAGNOSIS — C4359 Malignant melanoma of other part of trunk: Secondary | ICD-10-CM | POA: Diagnosis not present

## 2023-06-04 DIAGNOSIS — I700 Atherosclerosis of aorta: Secondary | ICD-10-CM | POA: Diagnosis not present

## 2023-06-04 DIAGNOSIS — C7931 Secondary malignant neoplasm of brain: Secondary | ICD-10-CM | POA: Diagnosis not present

## 2023-06-04 DIAGNOSIS — N400 Enlarged prostate without lower urinary tract symptoms: Secondary | ICD-10-CM | POA: Diagnosis not present

## 2023-06-04 DIAGNOSIS — Z803 Family history of malignant neoplasm of breast: Secondary | ICD-10-CM | POA: Diagnosis not present

## 2023-06-04 DIAGNOSIS — K314 Gastric diverticulum: Secondary | ICD-10-CM | POA: Diagnosis not present

## 2023-06-04 NOTE — Progress Notes (Signed)
[facility] [phone]) [phone]   [phone]  OFFICE PROGRESS NOTE  Laurann Montana, MD [phone] 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  Tumor is BRAF mutation positive  PRIOR THERAPY: 1) status post wide excision with right axillary sentinel lymph node sampling under the care of Dr. 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 2) Status post skin graft on Apr 10, 2019 by Dr. Leta Baptist 3) Status postcraniotomy and excision of brain tumor on August 05, 2020 by Dr. Maisie Fus. 4) Status post SRS following the craniotomy completed August 25, 2020 5) Status post treatment with 12 cycles of nivolumab 480 Mg IV every 4 weeks started September 22, 2020 and completed August 2022  CURRENT THERAPY: Observation  INTERVAL HISTORY: Walter Rodriguez 67 y.o. male came to the clinic today to establish care with me after his primary oncologist Dr. Clelia Croft left the practice.  The patient is feeling fine today with no concerning complaints.  He was accompanied today by his Sister Chyrl Civatte.  He denied having any chest pain, shortness of breath, cough or hemoptysis.  He has no nausea, vomiting, diarrhea or constipation.  He has no headache or visual changes.  He denied having any recent weight loss or night sweats.  He has been on observation and he is here for evaluation with repeat CT scan of the chest, abdomen pelvis as well as CT scan of the neck. Family history significant for mother with breast cancer and COPD.  Father had lung cancer. The patient is single and has no children he used to work and [employer] and currently retired.  He has no history for smoking, alcohol or drug abuse.   MEDICAL HISTORY: Past Medical History:  Diagnosis Date    Melanoma (HCC)    back   Metastatic cancer to brain (HCC) 07/2020   Open wound    back    ALLERGIES:  has No Known Allergies.  MEDICATIONS:  Current Outpatient Medications  Medication Sig Dispense Refill   Multiple Vitamin (MULTI-VITAMIN) tablet Take 1 tablet by mouth daily.     No current facility-administered medications for this visit.    SURGICAL HISTORY:  Past Surgical History:  Procedure Laterality Date   APPLICATION OF CRANIAL NAVIGATION N/A 08/05/2020   Procedure: APPLICATION OF CRANIAL NAVIGATION;  Surgeon: Bedelia Person, MD;  Location: [facility] OR;  Service: Neurosurgery;  Laterality: N/A;   CRANIOTOMY N/A 08/05/2020   Procedure: CRANIOTOMY TUMOR EXCISION;  Surgeon: Bedelia Person, MD;  Location: [facility] OR;  Service: Neurosurgery;  Laterality: N/A;   MELANOMA EXCISION WITH SENTINEL LYMPH NODE BIOPSY N/A 04/01/2019   Procedure: WIDE LOCAL EXCISION OF BACK MELANOMA WITH RIGHT AXILLARY SENTINEL LYMPH NODE BIOPSY;  Surgeon: Almond Lint, MD;  Location: [facility] OR;  Service: General;  Laterality: N/A;   SKIN SPLIT GRAFT N/A 04/10/2019   Procedure: SPLIT THICKNESS SKIN GRAFT FROM RIGHT  THIGH TO THE BACK;  Surgeon: Glenna Fellows, MD;  Location: [facility] OR;  Service: Plastics;  Laterality: N/A;    REVIEW OF SYSTEMS:  Constitutional: negative Eyes: negative Ears, nose, mouth, throat, and face: negative Respiratory: negative Cardiovascular: negative Gastrointestinal: negative Genitourinary:negative Integument/breast: negative Hematologic/lymphatic: negative Musculoskeletal:negative Neurological: negative Behavioral/Psych: negative Endocrine: negative Allergic/Immunologic: negative   PHYSICAL EXAMINATION: General appearance:  alert, cooperative, and no distress Head: Normocephalic, without obvious abnormality, atraumatic Neck: no adenopathy, no JVD, supple, symmetrical, trachea midline, and thyroid not enlarged, symmetric, no tenderness/mass/nodules Lymph nodes: Cervical,  supraclavicular, and axillary nodes normal. Resp: clear to auscultation bilaterally Back: symmetric, no curvature. ROM normal. No CVA tenderness. Cardio: regular rate and rhythm, S1, S2 normal, no murmur, click, rub or gallop GI: soft, non-tender; bowel sounds normal; no masses,  no organomegaly Extremities: extremities normal, atraumatic, no cyanosis or edema Neurologic: Alert and oriented X 3, normal strength and tone. Normal symmetric reflexes. Normal coordination and gait  ECOG PERFORMANCE STATUS: 1 - Symptomatic but completely ambulatory  Blood pressure 129/74, pulse (!) 53, temperature 97.7 F (36.5 C), temperature source Temporal, resp. rate 17, height 5\' 9"  (1.753 m), weight 163 lb 3.2 oz (74 kg), SpO2 99 %.  LABORATORY DATA: Lab Results  Component Value Date   WBC 6.6 05/28/2023   HGB 14.2 05/28/2023   HCT 43.1 05/28/2023   MCV 85.3 05/28/2023   PLT 204 05/28/2023      Chemistry      Component Value Date/Time   NA 140 05/28/2023 0756   K 4.2 05/28/2023 0756   CL 107 05/28/2023 0756   CO2 27 05/28/2023 0756   BUN 26 (H) 05/28/2023 0756   CREATININE 1.10 05/28/2023 0756   CREATININE 1.19 07/27/2020 0836      Component Value Date/Time   CALCIUM 9.3 05/28/2023 0756   ALKPHOS 48 05/28/2023 0756   AST 21 05/28/2023 0756   ALT 15 05/28/2023 0756   BILITOT 0.7 05/28/2023 0756       RADIOGRAPHIC STUDIES: CT CHEST ABDOMEN PELVIS W CONTRAST  Result Date: 05/29/2023 CLINICAL DATA:  History of metastatic melanoma, restaging. * Tracking Code: BO * EXAM: CT CHEST, ABDOMEN, AND PELVIS WITH CONTRAST TECHNIQUE: Multidetector CT imaging of the chest, abdomen and pelvis was performed following the standard protocol during bolus administration of intravenous contrast. RADIATION DOSE REDUCTION: This exam was performed according to the departmental dose-optimization program which includes automated exposure control, adjustment of the mA and/or kV according to patient size and/or use  of iterative reconstruction technique. CONTRAST:  OMNIPAQUE IOHEXOL 300 MG/ML  SOLN COMPARISON:  Multiple priors including most recent CT November 28, 2022 FINDINGS: CT CHEST FINDINGS Cardiovascular: Aortic atherosclerosis. Normal caliber thoracic aorta. No central pulmonary embolus on this nondedicated study. Normal size heart. No significant pericardial effusion/thickening. Mediastinum/Nodes: Stable appearance of the thyroid gland previously evaluated by thyroid ultrasound June 08, 2022. No pathologically enlarged mediastinal, hilar or axillary lymph nodes. Surgical clips in the right axilla. The esophagus is grossly unremarkable. Lungs/Pleura: No suspicious pulmonary nodules or masses. Biapical pleuroparenchymal scarring. No pleural effusion. No pneumothorax. Musculoskeletal: No aggressive lytic or blastic lesion of bone. CT ABDOMEN PELVIS FINDINGS Hepatobiliary: Subcapsular hypodensity measuring 9 mm in the posterior right lobe of the liver on image 58/2 is stable over multiple prior examinations previously evaluated by MRI and favored to reflect a benign hemangioma. No new suspicious hepatic lesion. Gallbladder is unremarkable. No biliary ductal dilation. Pancreas: No pancreatic ductal dilation or evidence of acute inflammation. Spleen: No splenomegaly. Adrenals/Urinary Tract: Bilateral adrenal glands appear normal. Bilateral renal cysts are considered benign and require no independent imaging follow-up. Kidneys demonstrate symmetric enhancement. Urinary bladder is unremarkable for degree of distension. Stomach/Bowel: Small gastric diverticulum. No pathologic dilation of small or large bowel. No evidence of acute bowel inflammation. Vascular/Lymphatic: Aortic atherosclerosis. Normal caliber abdominal aorta. Smooth IVC contours. No pathologically enlarged abdominal or  pelvic lymph nodes. Reproductive: Prostate is enlarged. Other: No significant abdominopelvic free fluid. Musculoskeletal: No aggressive  lytic or blastic lesion of bone. Multilevel degenerative change of the spine. Degenerative change of the bilateral hips and SI joints. IMPRESSION: 1. No evidence of metastatic disease within the chest, abdomen or pelvis. 2. Stable hypodensity in the posterior right lobe of the liver previously evaluated by MRI on which it demonstrated nonspecific imaging characteristics but favored to reflect a benign hemangioma. Suggest continued attention on follow-up. 3.  Aortic Atherosclerosis (ICD10-I70.0). Electronically Signed   By: Maudry Mayhew M.D.   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  Tumor is BRAF mutation positive He is status post the following treatment: 1) status post wide excision with right axillary sentinel lymph node sampling under the care of Dr. 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 2) Status post skin graft on Apr 10, 2019 by Dr. Leta Baptist 3) Status postcraniotomy and excision of brain tumor on August 05, 2020 by Dr. Maisie Fus. 4) Status post SRS following the craniotomy completed August 25, 2020 5) Status post treatment with 12 cycles of nivolumab 480 Mg IV every 4 weeks started September 22, 2020 and completed August 2022 The patient is currently on observation and he is feeling fine with no concerning complaints. He had repeat CT scan of the neck, chest, abdomen and pelvis performed recently.  The final report for the scan of the neck is still pending but there is no evidence for disease recurrence or metastasis seen on the scan of the chest, abdomen and pelvis. I recommended for the patient to continue on observation with repeat CT scan of the neck, chest, abdomen and pelvis in 6 months. He was advised to call immediately if  he has any other concerning symptoms in the interval. The patient voices understanding of current disease status and treatment options and is in agreement with the current care plan.  All questions were answered. The patient knows to call the clinic with any problems, questions or concerns. We can certainly see the patient much sooner if necessary. The total time spent in the appointment was 30 minutes.  Disclaimer: This note was dictated with voice recognition software. Similar sounding words can inadvertently be transcribed and may not be corrected upon review.

## 2023-06-20 DIAGNOSIS — L578 Other skin changes due to chronic exposure to nonionizing radiation: Secondary | ICD-10-CM | POA: Diagnosis not present

## 2023-06-20 DIAGNOSIS — Z85820 Personal history of malignant melanoma of skin: Secondary | ICD-10-CM | POA: Diagnosis not present

## 2023-06-20 DIAGNOSIS — Z85828 Personal history of other malignant neoplasm of skin: Secondary | ICD-10-CM | POA: Diagnosis not present

## 2023-06-20 DIAGNOSIS — D1801 Hemangioma of skin and subcutaneous tissue: Secondary | ICD-10-CM | POA: Diagnosis not present

## 2023-06-20 DIAGNOSIS — C7931 Secondary malignant neoplasm of brain: Secondary | ICD-10-CM | POA: Diagnosis not present

## 2023-06-20 DIAGNOSIS — L570 Actinic keratosis: Secondary | ICD-10-CM | POA: Diagnosis not present

## 2023-06-20 DIAGNOSIS — L814 Other melanin hyperpigmentation: Secondary | ICD-10-CM | POA: Diagnosis not present

## 2023-06-20 DIAGNOSIS — D229 Melanocytic nevi, unspecified: Secondary | ICD-10-CM | POA: Diagnosis not present

## 2023-06-20 DIAGNOSIS — L821 Other seborrheic keratosis: Secondary | ICD-10-CM | POA: Diagnosis not present

## 2023-09-13 DIAGNOSIS — Z23 Encounter for immunization: Secondary | ICD-10-CM | POA: Diagnosis not present

## 2023-09-13 DIAGNOSIS — E785 Hyperlipidemia, unspecified: Secondary | ICD-10-CM | POA: Diagnosis not present

## 2023-09-13 DIAGNOSIS — Z1159 Encounter for screening for other viral diseases: Secondary | ICD-10-CM | POA: Diagnosis not present

## 2023-09-13 DIAGNOSIS — H6123 Impacted cerumen, bilateral: Secondary | ICD-10-CM | POA: Diagnosis not present

## 2023-09-13 DIAGNOSIS — R7303 Prediabetes: Secondary | ICD-10-CM | POA: Diagnosis not present

## 2023-09-13 DIAGNOSIS — Z0000 Encounter for general adult medical examination without abnormal findings: Secondary | ICD-10-CM | POA: Diagnosis not present

## 2023-10-24 ENCOUNTER — Ambulatory Visit
Admission: RE | Admit: 2023-10-24 | Discharge: 2023-10-24 | Disposition: A | Discharge status: Home / Self Care | Payer: Medicare Other | Source: Ambulatory Visit | Attending: Internal Medicine | Admitting: Internal Medicine

## 2023-10-24 DIAGNOSIS — C7931 Secondary malignant neoplasm of brain: Secondary | ICD-10-CM | POA: Diagnosis not present

## 2023-10-24 DIAGNOSIS — C801 Malignant (primary) neoplasm, unspecified: Secondary | ICD-10-CM | POA: Diagnosis not present

## 2023-10-24 MED ORDER — GADOPICLENOL 0.5 MMOL/ML IV SOLN
7.5000 mL | Freq: Once | INTRAVENOUS | Status: AC | PRN
  Administered 2023-10-24: 7.5 mL via INTRAVENOUS

## 2023-10-28 ENCOUNTER — Inpatient Hospital Stay: Payer: Medicare Other | Attending: Internal Medicine | Admitting: Internal Medicine

## 2023-10-28 ENCOUNTER — Other Ambulatory Visit: Payer: Self-pay

## 2023-10-28 ENCOUNTER — Inpatient Hospital Stay: Payer: Medicare Other

## 2023-10-28 VITALS — BP 139/70 | HR 88 | Temp 99.5°F | Resp 18 | Wt 167.5 lb

## 2023-10-28 DIAGNOSIS — C7931 Secondary malignant neoplasm of brain: Secondary | ICD-10-CM | POA: Diagnosis not present

## 2023-10-28 DIAGNOSIS — Z85820 Personal history of malignant melanoma of skin: Secondary | ICD-10-CM | POA: Insufficient documentation

## 2023-10-28 NOTE — Progress Notes (Signed)
[facility] at [facility] [address]  [place], [place] [address] [phone]   Interval Evaluation  Date of Service: 10/28/23 Patient Name: Walter Rodriguez. Patient MRN: [id number] Patient DOB: 09/24/1956 Provider: Henreitta Leber, MD  Identifying Statement:  Jerick Filak. is a 67 y.o. male with Metastasis to brain [facility]) [C79.31]   Primary Cancer:  Oncologic History: Oncology History  Melanoma of skin (HCC)  09/07/2020 Initial Diagnosis   Melanoma of skin (HCC)   09/22/2020 - 07/28/2021 Chemotherapy   Patient is on Treatment Plan : LUNG Nivolumab q28d     07/28/2021 Cancer Staging   Staging form: Melanoma of the Skin, AJCC 8th Edition - Clinical: Stage IV (cTX, cNX, pM1a(1)) - Signed by Benjiman Core, MD on 07/28/2021   Metastasis to brain [facility])  08/05/2020 Surgery   Craniotomy, resection of 4.7cm R parietal mass with Dr. Maisie Fus; path c/w melanoma    - 08/31/2020 Radiation Therapy   Completes 27Gy/24fx to resection cavity with Dr. Mitzi Hansen     Interval History:  [name]. presents today for follow up after recent MRI brain.  No new or progressive neurologic deficits.  Continues on surveillance for melanoma.  Maintains aerobic exercise regimen, walking and cycling.  H+P (11/29/20) Patient presented to clinical attention in August 2021 with several weeks history of left arm and leg weakness.  He experienced a fall at home and was unable to get up, which prompted evaluation and CNS imaging.  MRI demonstrated large enhancing mass within right parietal lobe consistent with metastatic disease.  He underwent craniotomy and gross resection with Dr. Maisie Fus on 08/05/20, which was followed by post-op SRS in 3 fractions with Dr. Mitzi Hansen, completed on 08/31/20.  He has completed decadron taper.  At present time he has no motor complaints and no subjective neurologic deficits.  Is undergoing rx with Nivolumab with Dr. Clelia Croft.  Very active at home, walks  and cycles every day.  Medications: Current Outpatient Medications on File Prior to Visit  Medication Sig Dispense Refill   Multiple Vitamin (MULTI-VITAMIN) tablet Take 1 tablet by mouth daily.     No current facility-administered medications on file prior to visit.    Allergies: No Known Allergies Past Medical History:  Past Medical History:  Diagnosis Date   Melanoma (HCC)    back   Metastatic cancer to brain (HCC) 07/2020   Open wound    back   Past Surgical History:  Past Surgical History:  Procedure Laterality Date   APPLICATION OF CRANIAL NAVIGATION N/A 08/05/2020   Procedure: APPLICATION OF CRANIAL NAVIGATION;  Surgeon: Bedelia Person, MD;  Location: [facility] OR;  Service: Neurosurgery;  Laterality: N/A;   CRANIOTOMY N/A 08/05/2020   Procedure: CRANIOTOMY TUMOR EXCISION;  Surgeon: Bedelia Person, MD;  Location: [facility] OR;  Service: Neurosurgery;  Laterality: N/A;   MELANOMA EXCISION WITH SENTINEL LYMPH NODE BIOPSY N/A 04/01/2019   Procedure: WIDE LOCAL EXCISION OF BACK MELANOMA WITH RIGHT AXILLARY SENTINEL LYMPH NODE BIOPSY;  Surgeon: Almond Lint, MD;  Location: [facility] OR;  Service: General;  Laterality: N/A;   SKIN SPLIT GRAFT N/A 04/10/2019   Procedure: SPLIT THICKNESS SKIN GRAFT FROM RIGHT  THIGH TO THE BACK;  Surgeon: Glenna Fellows, MD;  Location: [facility] OR;  Service: Plastics;  Laterality: N/A;   Social History:  Social History   Socioeconomic History   Marital status: Single    Spouse name: Not on file   Number of children:  Not on file   Years of education: Not on file   Highest education level: Not on file  Occupational History   Not on file  Tobacco Use   Smoking status: Never   Smokeless tobacco: Never  Vaping Use   Vaping status: Never Used  Substance and Sexual Activity   Alcohol use: Never   Drug use: Never   Sexual activity: Not on file  Other Topics Concern   Not on file  Social History Narrative   Not on file   Social Determinants of Health    Financial Resource Strain: Not on file  Food Insecurity: Not on file  Transportation Needs: No Transportation Needs (09/06/2022)   PRAPARE - Transportation    Lack of Transportation (Medical): No    Lack of Transportation (Non-Medical): No  Physical Activity: Not on file  Stress: Not on file  Social Connections: Not on file  Intimate Partner Violence: Not on file   Family History: No family history on file.  Review of Systems: Constitutional: Doesn't report fevers, chills or abnormal weight loss Eyes: Doesn't report blurriness of vision Ears, nose, mouth, throat, and face: Doesn't report sore throat Respiratory: Doesn't report cough, dyspnea or wheezes Cardiovascular: Doesn't report palpitation, chest discomfort  Gastrointestinal:  Doesn't report nausea, constipation, diarrhea GU: Doesn't report incontinence Skin: Doesn't report skin rashes Neurological: Per HPI Musculoskeletal: Doesn't report joint pain Behavioral/Psych: Doesn't report anxiety  Physical Exam: Wt Readings from Last 3 Encounters:  10/28/23 167 lb 8 oz (76 kg)  06/04/23 163 lb 3.2 oz (74 kg)  04/30/23 166 lb 12.8 oz (75.7 kg)   Temp Readings from Last 3 Encounters:  10/28/23 99.5 F (37.5 C) (Temporal)  06/04/23 97.7 F (36.5 C) (Temporal)  04/30/23 98.1 F (36.7 C) (Oral)   BP Readings from Last 3 Encounters:  10/28/23 139/70  06/04/23 129/74  04/30/23 139/70   Pulse Readings from Last 3 Encounters:  10/28/23 88  06/04/23 (!) 53  04/30/23 61    KPS: 90. General: Alert, cooperative, pleasant, in no acute distress Head: Normal EENT: No conjunctival injection or scleral icterus.  Lungs: Resp effort normal Cardiac: Regular rate Abdomen: Non-distended abdomen Skin: No rashes cyanosis or petechiae. Extremities: No clubbing or edema  Neurologic Exam: Mental Status: Awake, alert, attentive to examiner. Oriented to self and environment. Language is fluent with intact comprehension.  Cranial  Nerves: Visual acuity is grossly normal. Visual fields are full. Extra-ocular movements intact. No ptosis. Face is symmetric Motor: Tone and bulk are normal. Power is full in both arms and legs. Reflexes are symmetric, no pathologic reflexes present.  Sensory: Intact to light touch Gait: Normal.   Labs: I have reviewed the data as listed    Component Value Date/Time   NA 140 05/28/2023 0756   K 4.2 05/28/2023 0756   CL 107 05/28/2023 0756   CO2 27 05/28/2023 0756   GLUCOSE 91 05/28/2023 0756   BUN 26 (H) 05/28/2023 0756   CREATININE 1.10 05/28/2023 0756   CREATININE 1.19 07/27/2020 0836   CALCIUM 9.3 05/28/2023 0756   PROT 6.1 (L) 05/28/2023 0756   ALBUMIN 3.9 05/28/2023 0756   AST 21 05/28/2023 0756   ALT 15 05/28/2023 0756   ALKPHOS 48 05/28/2023 0756   BILITOT 0.7 05/28/2023 0756   GFRNONAA >60 05/28/2023 0756   GFRNONAA 63 07/27/2020 0836   GFRAA >60 08/07/2020 0429   GFRAA 73 07/27/2020 0836   Lab Results  Component Value Date   WBC 6.6 05/28/2023  NEUTROABS 4.8 05/28/2023   HGB 14.2 05/28/2023   HCT 43.1 05/28/2023   MCV 85.3 05/28/2023   PLT 204 05/28/2023    Imaging:  MR BRAIN W WO CONTRAST  Result Date: 10/24/2023 CLINICAL DATA:  Assess treatment response.  CNS neoplasm EXAM: MRI HEAD WITHOUT AND WITH CONTRAST TECHNIQUE: Multiplanar, multiecho pulse sequences of the brain and surrounding structures were obtained without and with intravenous contrast. CONTRAST:  7.5 cc of vueway intravenous COMPARISON:  04/25/2023 FINDINGS: Brain: The lateral right frontal resection cavity shows 3 areas of marginal nodular enhancement. Multiple measurements are taken and the nodules measure stable to 1-2 mm larger than before. The most pronounced changes at the most posterior nodule measuring 8 mm craniocaudal on 16:7-previously 6-7 mm. No new area of abnormal enhancement. Regional T2 hyperintensity is non progressed. No incidental infarct, hemorrhage, hydrocephalus, or  collection. Vascular: Normal flow voids and vascular enhancements. Skull and upper cervical spine: Unremarkable craniotomy site Sinuses/Orbits: Unremarkable IMPRESSION: Stable number of enhancing nodules around the right cerebral resection site. The nodules measure stable to 1-2 mm larger than before. Overall no significant change from prior. No new lesion. Electronically Signed   By: Tiburcio Pea M.D.   On: 10/24/2023 11:36     [facility] Clinician Interpretation: I have personally reviewed the radiological images as listed.  My interpretation, in the context of the patient's clinical presentation, is stable disease pending official read   Assessment/Plan Brain Metastasis  Abdulwahab Frade. is clinically stable today.  MRI demonstrates very subtle increase in enhancing volume of 2 nodules adjacent to resection cavity.  Etiology could be post-RT changes.    We discussed and recommended further MRI surveillance.    We again encouraged him to remain active and continue aerobic exercise regimen.  We appreciate the opportunity to participate in the care of Ranvir Halsey..   We ask that [name]. return to clinic in 6 months following next brain MRI, or sooner as needed.  All questions were answered. The patient knows to call the clinic with any problems, questions or concerns. No barriers to learning were detected.  The total time spent in the encounter was 30 minutes and more than 50% was on counseling and review of test results   Henreitta Leber, MD Medical Director of Neuro-Oncology [facility] 10/28/23 11:18 AM

## 2023-10-29 ENCOUNTER — Other Ambulatory Visit: Payer: Self-pay | Admitting: Radiology

## 2023-11-05 DIAGNOSIS — D1801 Hemangioma of skin and subcutaneous tissue: Secondary | ICD-10-CM | POA: Diagnosis not present

## 2023-11-05 DIAGNOSIS — L821 Other seborrheic keratosis: Secondary | ICD-10-CM | POA: Diagnosis not present

## 2023-11-05 DIAGNOSIS — L570 Actinic keratosis: Secondary | ICD-10-CM | POA: Diagnosis not present

## 2023-11-05 DIAGNOSIS — Z85820 Personal history of malignant melanoma of skin: Secondary | ICD-10-CM | POA: Diagnosis not present

## 2023-11-05 DIAGNOSIS — L578 Other skin changes due to chronic exposure to nonionizing radiation: Secondary | ICD-10-CM | POA: Diagnosis not present

## 2023-11-05 DIAGNOSIS — C7931 Secondary malignant neoplasm of brain: Secondary | ICD-10-CM | POA: Diagnosis not present

## 2023-11-05 DIAGNOSIS — D229 Melanocytic nevi, unspecified: Secondary | ICD-10-CM | POA: Diagnosis not present

## 2023-11-05 DIAGNOSIS — L814 Other melanin hyperpigmentation: Secondary | ICD-10-CM | POA: Diagnosis not present

## 2023-11-05 DIAGNOSIS — Z85828 Personal history of other malignant neoplasm of skin: Secondary | ICD-10-CM | POA: Diagnosis not present

## 2023-11-27 ENCOUNTER — Inpatient Hospital Stay: Payer: Medicare Other | Attending: Internal Medicine

## 2023-11-27 ENCOUNTER — Ambulatory Visit (HOSPITAL_COMMUNITY)
Admission: RE | Admit: 2023-11-27 | Discharge: 2023-11-27 | Disposition: A | Discharge status: Home / Self Care | Payer: Medicare Other | Source: Ambulatory Visit | Attending: Internal Medicine | Admitting: Internal Medicine

## 2023-11-27 ENCOUNTER — Other Ambulatory Visit: Payer: Self-pay | Admitting: Internal Medicine

## 2023-11-27 DIAGNOSIS — R932 Abnormal findings on diagnostic imaging of liver and biliary tract: Secondary | ICD-10-CM | POA: Diagnosis not present

## 2023-11-27 DIAGNOSIS — R918 Other nonspecific abnormal finding of lung field: Secondary | ICD-10-CM | POA: Diagnosis not present

## 2023-11-27 DIAGNOSIS — D1803 Hemangioma of intra-abdominal structures: Secondary | ICD-10-CM | POA: Insufficient documentation

## 2023-11-27 DIAGNOSIS — K056 Periodontal disease, unspecified: Secondary | ICD-10-CM | POA: Diagnosis not present

## 2023-11-27 DIAGNOSIS — E041 Nontoxic single thyroid nodule: Secondary | ICD-10-CM | POA: Insufficient documentation

## 2023-11-27 DIAGNOSIS — C7931 Secondary malignant neoplasm of brain: Secondary | ICD-10-CM | POA: Diagnosis not present

## 2023-11-27 DIAGNOSIS — K314 Gastric diverticulum: Secondary | ICD-10-CM | POA: Insufficient documentation

## 2023-11-27 DIAGNOSIS — C439 Malignant melanoma of skin, unspecified: Secondary | ICD-10-CM | POA: Insufficient documentation

## 2023-11-27 DIAGNOSIS — C4359 Malignant melanoma of other part of trunk: Secondary | ICD-10-CM | POA: Diagnosis not present

## 2023-11-27 DIAGNOSIS — I700 Atherosclerosis of aorta: Secondary | ICD-10-CM | POA: Insufficient documentation

## 2023-11-27 DIAGNOSIS — M47812 Spondylosis without myelopathy or radiculopathy, cervical region: Secondary | ICD-10-CM | POA: Diagnosis not present

## 2023-11-27 DIAGNOSIS — K5730 Diverticulosis of large intestine without perforation or abscess without bleeding: Secondary | ICD-10-CM | POA: Diagnosis not present

## 2023-11-27 LAB — CBC WITH DIFFERENTIAL (CANCER CENTER ONLY)
Abs Immature Granulocytes: 0.02 10*3/uL (ref 0.00–0.07)
Basophils Absolute: 0 10*3/uL (ref 0.0–0.1)
Basophils Relative: 1 %
Eosinophils Absolute: 0.1 10*3/uL (ref 0.0–0.5)
Eosinophils Relative: 2 %
HCT: 43.2 % (ref 39.0–52.0)
Hemoglobin: 14.5 g/dL (ref 13.0–17.0)
Immature Granulocytes: 0 %
Lymphocytes Relative: 19 %
Lymphs Abs: 1.3 10*3/uL (ref 0.7–4.0)
MCH: 27.9 pg (ref 26.0–34.0)
MCHC: 33.6 g/dL (ref 30.0–36.0)
MCV: 83.1 fL (ref 80.0–100.0)
Monocytes Absolute: 0.4 10*3/uL (ref 0.1–1.0)
Monocytes Relative: 5 %
Neutro Abs: 4.8 10*3/uL (ref 1.7–7.7)
Neutrophils Relative %: 73 %
Platelet Count: 220 10*3/uL (ref 150–400)
RBC: 5.2 MIL/uL (ref 4.22–5.81)
RDW: 13.2 % (ref 11.5–15.5)
WBC Count: 6.7 10*3/uL (ref 4.0–10.5)
nRBC: 0 % (ref 0.0–0.2)

## 2023-11-27 LAB — CMP (CANCER CENTER ONLY)
ALT: 16 U/L (ref 0–44)
AST: 19 U/L (ref 15–41)
Albumin: 4.1 g/dL (ref 3.5–5.0)
Alkaline Phosphatase: 51 U/L (ref 38–126)
Anion gap: 6 (ref 5–15)
BUN: 28 mg/dL — ABNORMAL HIGH (ref 8–23)
CO2: 25 mmol/L (ref 22–32)
Calcium: 9.1 mg/dL (ref 8.9–10.3)
Chloride: 108 mmol/L (ref 98–111)
Creatinine: 1.12 mg/dL (ref 0.61–1.24)
GFR, Estimated: >60 mL/min (ref >60)
Glucose, Bld: 92 mg/dL (ref 70–99)
Potassium: 4.2 mmol/L (ref 3.5–5.1)
Sodium: 139 mmol/L (ref 135–145)
Total Bilirubin: 0.6 mg/dL (ref <1.2)
Total Protein: 6.3 g/dL — ABNORMAL LOW (ref 6.5–8.1)

## 2023-11-27 LAB — LACTATE DEHYDROGENASE: LDH: 146 U/L (ref 98–192)

## 2023-11-27 MED ORDER — IOHEXOL 300 MG/ML  SOLN
100.0000 mL | Freq: Once | INTRAMUSCULAR | Status: AC | PRN
  Administered 2023-11-27: 100 mL via INTRAVENOUS

## 2023-11-27 MED ORDER — SODIUM CHLORIDE (PF) 0.9 % IJ SOLN
INTRAMUSCULAR | Status: AC
  Filled 2023-11-27: qty 50

## 2023-12-05 ENCOUNTER — Other Ambulatory Visit: Payer: Self-pay

## 2023-12-05 ENCOUNTER — Inpatient Hospital Stay: Payer: Medicare Other | Admitting: Internal Medicine

## 2023-12-05 VITALS — BP 132/65 | HR 56 | Temp 98.3°F | Resp 17 | Ht 69.0 in | Wt 167.4 lb

## 2023-12-05 DIAGNOSIS — E041 Nontoxic single thyroid nodule: Secondary | ICD-10-CM | POA: Diagnosis not present

## 2023-12-05 DIAGNOSIS — C439 Malignant melanoma of skin, unspecified: Secondary | ICD-10-CM | POA: Diagnosis not present

## 2023-12-05 DIAGNOSIS — C7931 Secondary malignant neoplasm of brain: Secondary | ICD-10-CM

## 2023-12-05 DIAGNOSIS — K056 Periodontal disease, unspecified: Secondary | ICD-10-CM | POA: Diagnosis not present

## 2023-12-05 DIAGNOSIS — R918 Other nonspecific abnormal finding of lung field: Secondary | ICD-10-CM | POA: Diagnosis not present

## 2023-12-05 DIAGNOSIS — K5730 Diverticulosis of large intestine without perforation or abscess without bleeding: Secondary | ICD-10-CM | POA: Diagnosis not present

## 2023-12-05 DIAGNOSIS — K314 Gastric diverticulum: Secondary | ICD-10-CM | POA: Diagnosis not present

## 2023-12-05 DIAGNOSIS — C4359 Malignant melanoma of other part of trunk: Secondary | ICD-10-CM | POA: Diagnosis not present

## 2023-12-05 DIAGNOSIS — I700 Atherosclerosis of aorta: Secondary | ICD-10-CM | POA: Diagnosis not present

## 2023-12-05 DIAGNOSIS — D1803 Hemangioma of intra-abdominal structures: Secondary | ICD-10-CM | POA: Diagnosis not present

## 2023-12-05 DIAGNOSIS — M47812 Spondylosis without myelopathy or radiculopathy, cervical region: Secondary | ICD-10-CM | POA: Diagnosis not present

## 2023-12-05 NOTE — Progress Notes (Signed)
[facility] [phone]   [phone]  OFFICE PROGRESS NOTE  Laurann Montana, MD [phone] 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  Tumor is BRAF mutation positive  PRIOR THERAPY: 1) status post wide excision with right axillary sentinel lymph node sampling under the care of Dr. 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 2) Status post skin graft on Apr 10, 2019 by Dr. Leta Baptist 3) Status postcraniotomy and excision of brain tumor on August 05, 2020 by Dr. Maisie Fus. 4) Status post SRS following the craniotomy completed August 25, 2020 5) Status post treatment with 12 cycles of nivolumab 480 Mg IV every 4 weeks started September 22, 2020 and completed August 2022  CURRENT THERAPY: Observation  INTERVAL HISTORY: Walter Rodriguez. 67 y.o. male returns to the clinic for 6 months follow-up visit accompanied by his sister.Discussed the use of AI scribe software for clinical note transcription with the patient, who gave verbal consent to proceed.  History of Present Illness   Walter Rodriguez, a 67 year old patient, was diagnosed with malignant melanoma in 2020. The initial disease was managed with surgical resection. However, disease progression was noted with the development of brain metastases. These were treated with radiation therapy and neurosurgical intervention. Following this, the patient was started on nivolumab (Opdivo) 480 mg every four weeks, a treatment course that was completed in August 2022. Since then, the patient has been under observation.  In the six months since the last consultation, the patient reports no new complaints or changes in health status. Specifically, there have been no symptoms of chest pain, shortness of breath,  cough, weight loss, headaches, or changes in vision. The patient's mental status has remained stable, with no noticeable changes in behavior or cognition.  The patient has also been monitoring his blood work results and noted a change in the tests ordered. Previously, TSH levels were monitored, but the most recent tests included LDH (lactate dehydrogenase) levels. The patient does not report any known thyroid issues and is not currently taking levothyroxine. The LDH levels were within the normal range.       MEDICAL HISTORY: Past Medical History:  Diagnosis Date   Melanoma (HCC)    back   Metastatic cancer to brain (HCC) 07/2020   Open wound    back    ALLERGIES:  has no known allergies.  MEDICATIONS:  Current Outpatient Medications  Medication Sig Dispense Refill   Multiple Vitamin (MULTI-VITAMIN) tablet Take 1 tablet by mouth daily.     No current facility-administered medications for this visit.    SURGICAL HISTORY:  Past Surgical History:  Procedure Laterality Date   APPLICATION OF CRANIAL NAVIGATION N/A 08/05/2020   Procedure: APPLICATION OF CRANIAL NAVIGATION;  Surgeon: Bedelia Person, MD;  Location: [facility] OR;  Service: Neurosurgery;  Laterality: N/A;   CRANIOTOMY N/A 08/05/2020   Procedure: CRANIOTOMY TUMOR EXCISION;  Surgeon: Bedelia Person, MD;  Location: [facility] OR;  Service: Neurosurgery;  Laterality: N/A;   MELANOMA EXCISION WITH SENTINEL LYMPH NODE BIOPSY N/A 04/01/2019   Procedure: WIDE LOCAL EXCISION OF BACK MELANOMA WITH RIGHT AXILLARY SENTINEL LYMPH NODE BIOPSY;  Surgeon: Almond Lint, MD;  Location: [facility] OR;  Service: General;  Laterality: N/A;   SKIN SPLIT GRAFT N/A  04/10/2019   Procedure: SPLIT THICKNESS SKIN GRAFT FROM RIGHT  THIGH TO THE BACK;  Surgeon: Glenna Fellows, MD;  Location: [facility] OR;  Service: Plastics;  Laterality: N/A;    REVIEW OF SYSTEMS:  Constitutional: negative Eyes: negative Ears, nose, mouth, throat, and face: negative Respiratory:  negative Cardiovascular: negative Gastrointestinal: negative Genitourinary:negative Integument/breast: negative Hematologic/lymphatic: negative Musculoskeletal:negative Neurological: negative Behavioral/Psych: negative Endocrine: negative Allergic/Immunologic: negative   PHYSICAL EXAMINATION: General appearance: alert, cooperative, and no distress Head: Normocephalic, without obvious abnormality, atraumatic Neck: no adenopathy, no JVD, supple, symmetrical, trachea midline, and thyroid not enlarged, symmetric, no tenderness/mass/nodules Lymph nodes: Cervical, supraclavicular, and axillary nodes normal. Resp: clear to auscultation bilaterally Back: symmetric, no curvature. ROM normal. No CVA tenderness. Cardio: regular rate and rhythm, S1, S2 normal, no murmur, click, rub or gallop GI: soft, non-tender; bowel sounds normal; no masses,  no organomegaly Extremities: extremities normal, atraumatic, no cyanosis or edema Neurologic: Alert and oriented X 3, normal strength and tone. Normal symmetric reflexes. Normal coordination and gait  ECOG PERFORMANCE STATUS: 1 - Symptomatic but completely ambulatory  Blood pressure 132/65, pulse (!) 56, temperature 98.3 F (36.8 C), temperature source Temporal, resp. rate 17, height 5\' 9"  (1.753 m), weight 167 lb 6.4 oz (75.9 kg), SpO2 99%.  LABORATORY DATA: Lab Results  Component Value Date   WBC 6.7 11/27/2023   HGB 14.5 11/27/2023   HCT 43.2 11/27/2023   MCV 83.1 11/27/2023   PLT 220 11/27/2023      Chemistry      Component Value Date/Time   NA 139 11/27/2023 0833   K 4.2 11/27/2023 0833   CL 108 11/27/2023 0833   CO2 25 11/27/2023 0833   BUN 28 (H) 11/27/2023 0833   CREATININE 1.12 11/27/2023 0833   CREATININE 1.19 07/27/2020 0836      Component Value Date/Time   CALCIUM 9.1 11/27/2023 0833   ALKPHOS 51 11/27/2023 0833   AST 19 11/27/2023 0833   ALT 16 11/27/2023 0833   BILITOT 0.6 11/27/2023 0833       RADIOGRAPHIC  STUDIES: CT Soft Tissue Neck W Contrast Result Date: 12/03/2023 CLINICAL DATA:  Skin cancer staging.  Melanoma of the skin. EXAM: CT NECK WITH CONTRAST TECHNIQUE: Multidetector CT imaging of the neck was performed using the standard protocol following the bolus administration of intravenous contrast. RADIATION DOSE REDUCTION: This exam was performed according to the departmental dose-optimization program which includes automated exposure control, adjustment of the mA and/or kV according to patient size and/or use of iterative reconstruction technique. CONTRAST:  OMNIPAQUE IOHEXOL 300 MG/ML  SOLN COMPARISON:  Neck CT 05/28/2023 FINDINGS: Pharynx and larynx: No mucosal or submucosal lesion or inflammatory change. Salivary glands: Parotid and submandibular glands are normal. Thyroid: Redemonstration of a right-sided thyroid nodule measuring up to 2.4 cm in diameter. Previous recommendation was for annual or biannual ultrasound follow-up, based on the thyroid ultrasound of 06/08/2022. Lymph nodes: No lymphadenopathy on either side of the neck. Normal bilateral cervical chain nodes. No evidence of new or enlarging soft tissue nodule. Vascular: No abnormal finding. Limited intracranial: Previous treatment changes on the right, not primarily or completely evaluated. Visualized orbits: Normal Mastoids and visualized paranasal sinuses: Mucosal inflammatory changes of the ethmoid and maxillary sinuses. Skeleton: Dental and periodontal disease. Ordinary cervical spondylosis. Upper chest: Lung apices are clear. Other: Postsurgical defect of the skin and subcutaneous tissues of the right upper back/lower neck region without apparent change since the prior study. IMPRESSION: 1. No evidence of metastatic disease to  the neck. 2. Redemonstration of a right-sided thyroid nodule measuring up to 2.4 cm in diameter. Previous recommendation was for annual or biannual ultrasound follow-up, based on the thyroid ultrasound of  06/08/2022. 3. Dental and periodontal disease. 4. No change in appearance of the surgical defect of the right posterior lower neck/upper back. Electronically Signed   By: Paulina Fusi M.D.   On: 12/03/2023 12:45   CT CHEST ABDOMEN PELVIS W CONTRAST Result Date: 11/27/2023 CLINICAL DATA:  History of metastatic melanoma, follow-up. * Tracking Code: BO * EXAM: CT CHEST, ABDOMEN, AND PELVIS WITH CONTRAST TECHNIQUE: Multidetector CT imaging of the chest, abdomen and pelvis was performed following the standard protocol during bolus administration of intravenous contrast. RADIATION DOSE REDUCTION: This exam was performed according to the departmental dose-optimization program which includes automated exposure control, adjustment of the mA and/or kV according to patient size and/or use of iterative reconstruction technique. CONTRAST:  OMNIPAQUE IOHEXOL 300 MG/ML  SOLN COMPARISON:  Multiple priors including most recent CT May 28, 2023 FINDINGS: CT CHEST FINDINGS Cardiovascular: Normal caliber thoracic aorta. Aortic atherosclerosis. No central pulmonary embolus on this nondedicated study. Normal size heart. No significant pericardial effusion/thickening. Mediastinum/Nodes: Stable appearance of the thyroid gland previously evaluated by thyroid ultrasound June 08, 2022. No pathologically enlarged mediastinal, hilar or axillary lymph nodes. The esophagus is grossly unremarkable. Lungs/Pleura: Biapical pleuroparenchymal scarring. No suspicious pulmonary nodules or masses. No pleural effusion. No pneumothorax. Musculoskeletal: No aggressive lytic or blastic lesion of bone. Multilevel degenerative changes spine. CT ABDOMEN PELVIS FINDINGS Hepatobiliary: Subcapsular hypodensity in the posterior right lobe of the liver previously evaluated by MRI and favored to reflect a benign hepatic hemangioma but difficult to definitively characterize given its small size is unchanged. No new suspicious hepatic lesion. Gallbladder is  unremarkable. No biliary ductal dilation. Pancreas: No pancreatic ductal dilation or evidence of acute inflammation. Spleen: No splenomegaly. Adrenals/Urinary Tract: Bilateral adrenal glands appear normal. Bilateral renal cysts are considered benign and requiring no independent imaging follow-up. No hydronephrosis. Kidneys demonstrate symmetric enhancement. Urinary bladder is unremarkable for degree of distension. Stomach/Bowel: Small gastric diverticulum. No pathologic dilation of large or small bowel. No evidence of acute bowel inflammation. Normal appendix. Colonic diverticulosis. Vascular/Lymphatic: Normal caliber abdominal aorta. Aortic atherosclerosis. Smooth IVC contours. The portal, splenic and superior mesenteric veins are patent. No pathologically enlarged abdominal or pelvic lymph nodes. Reproductive: Prostate is unremarkable. Other: No significant abdominopelvic free fluid. Musculoskeletal: No aggressive lytic or blastic lesion of bone. Multilevel degenerative changes spine. IMPRESSION: 1. No evidence of metastatic disease within the chest, abdomen or pelvis. 2. 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 Continued attention on follow-up imaging suggested. 3. Stable appearance of the thyroid gland previously evaluated by thyroid ultrasound June 08, 2022. 4.  Aortic Atherosclerosis (ICD10-I70.0). Electronically Signed   By: Maudry Mayhew M.D.   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  Tumor is BRAF mutation positive He is status post the following treatment: 1) status post wide excision with right axillary sentinel lymph node sampling under the care of Dr. 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 2)  Status post skin graft on Apr 10, 2019 by Dr. Leta Baptist 3) Status postcraniotomy and  excision of brain tumor on August 05, 2020 by Dr. Maisie Fus. 4) Status post SRS following the craniotomy completed August 25, 2020 5) Status post treatment with 12 cycles of nivolumab 480 Mg IV every 4 weeks started September 22, 2020 and completed August 2022.  The patient has been in observation since that time and he is feeling fine. He had repeat CT scan of the neck, chest, abdomen and pelvis performed recently.  I personally and independently reviewed the scan and discussed the result with the patient and his sister. His scan showed no concerning findings for disease progression. Assessment and Plan    Malignant Melanoma with Brain Metastasis Diagnosed in 2020 with malignant melanoma, initially resected. Disease progression with brain metastasis treated with radiation and brain surgery. Completed nivolumab (Opdivo) 480 mg every four weeks in August 2022. Currently under observation with no new complaints or symptoms. Mental status is well-managed. Recent scans of neck, chest, abdomen, and pelvis show no concerning findings. LDH level is 146, within normal limits. - Order scan of neck, chest, abdomen, and pelvis in six months - Follow-up in six months  General Health Maintenance No current thyroid issues. Not taking levothyroxine. - Monitor LDH levels every six months  Follow-up - Schedule follow-up appointment in six months.   The patient was advised to call immediately if he has any concerning symptoms in the interval. The patient voices understanding of current disease status and treatment options and is in agreement with the current care plan.  All questions were answered. The patient knows to call the clinic with any problems, questions or concerns. We can certainly see the patient much sooner if necessary. The total time spent in the appointment was 30 minutes.  Disclaimer: This note was dictated with  voice recognition software. Similar sounding words can inadvertently be transcribed and may not be corrected upon review.

## 2024-04-23 ENCOUNTER — Ambulatory Visit
Admission: RE | Admit: 2024-04-23 | Discharge: 2024-04-23 | Disposition: A | Discharge status: Home / Self Care | Payer: Medicare Other | Source: Ambulatory Visit | Attending: Internal Medicine

## 2024-04-23 ENCOUNTER — Other Ambulatory Visit: Payer: Self-pay | Admitting: Radiation Therapy

## 2024-04-23 DIAGNOSIS — C439 Malignant melanoma of skin, unspecified: Secondary | ICD-10-CM | POA: Diagnosis not present

## 2024-04-23 DIAGNOSIS — C7931 Secondary malignant neoplasm of brain: Secondary | ICD-10-CM | POA: Diagnosis not present

## 2024-04-23 MED ORDER — GADOPICLENOL 0.5 MMOL/ML IV SOLN
8.0000 mL | Freq: Once | INTRAVENOUS | Status: AC | PRN
  Administered 2024-04-23: 8 mL via INTRAVENOUS

## 2024-04-27 ENCOUNTER — Inpatient Hospital Stay: Payer: Medicare Other | Attending: Internal Medicine | Admitting: Internal Medicine

## 2024-04-27 ENCOUNTER — Other Ambulatory Visit: Payer: Self-pay | Admitting: Radiation Therapy

## 2024-04-27 ENCOUNTER — Inpatient Hospital Stay: Payer: Medicare Other

## 2024-04-27 ENCOUNTER — Inpatient Hospital Stay

## 2024-04-27 VITALS — BP 145/78 | HR 54 | Temp 98.2°F | Resp 17 | Wt 162.9 lb

## 2024-04-27 DIAGNOSIS — Z85820 Personal history of malignant melanoma of skin: Secondary | ICD-10-CM | POA: Insufficient documentation

## 2024-04-27 DIAGNOSIS — C7931 Secondary malignant neoplasm of brain: Secondary | ICD-10-CM | POA: Diagnosis not present

## 2024-04-27 NOTE — Progress Notes (Signed)
 [facility] [address]  [place], [place] [address] [phone]   Interval Evaluation  Date of Service: 04/27/24 Patient Name: Walter Rodriguez. Patient MRN: [id number] Patient DOB: 1956/09/02 Provider: Mamie Searles, MD  Identifying Statement:  Walter Sahagun. is a 68 y.o. male with Metastasis to brain [facility]) [C79.31]   Primary Cancer:  Oncologic History: Oncology History  Melanoma of skin (HCC)  09/07/2020 Initial Diagnosis   Melanoma of skin (HCC)   09/22/2020 - 07/28/2021 Chemotherapy   Patient is on Treatment Plan : LUNG Nivolumab  q28d     07/28/2021 Cancer Staging   Staging form: Melanoma of the Skin, AJCC 8th Edition - Clinical: Stage IV (cTX, cNX, pM1a(1)) - Signed by Renna Cary, MD on 07/28/2021   Metastasis to brain [facility])  08/05/2020 Surgery   Craniotomy, resection of 4.7cm R parietal mass with Dr. Andy Bannister; path c/w melanoma    - 08/31/2020 Radiation Therapy   Completes 27Gy/78fx to resection cavity with Dr. Jeryl Moris     Interval History:  Walter Lesser. presents today for follow up after recent MRI brain.  Denies any progressive neurologic deficits.  Continues on surveillance for melanoma.  Maintains aerobic exercise regimen, walking and cycling.  H+P (11/29/20) Patient presented to clinical attention in August 2021 with several weeks history of left arm and leg weakness.  He experienced a fall at home and was unable to get up, which prompted evaluation and CNS imaging.  MRI demonstrated large enhancing mass within right parietal lobe consistent with metastatic disease.  He underwent craniotomy and gross resection with Dr. Andy Bannister on 08/05/20, which was followed by post-op SRS in 3 fractions with Dr. Jeryl Moris, completed on 08/31/20.  He has completed decadron  taper.  At present time he has no motor complaints and no subjective neurologic deficits.  Is undergoing rx with Nivolumab  with Dr. Dirk Fredericks.  Very active at home, walks  and cycles every day.  Medications: Current Outpatient Medications on File Prior to Visit  Medication Sig Dispense Refill   Multiple Vitamin (MULTI-VITAMIN) tablet Take 1 tablet by mouth daily.     No current facility-administered medications on file prior to visit.    Allergies: No Known Allergies Past Medical History:  Past Medical History:  Diagnosis Date   Melanoma (HCC)    back   Metastatic cancer to brain (HCC) 07/2020   Open wound    back   Past Surgical History:  Past Surgical History:  Procedure Laterality Date   APPLICATION OF CRANIAL NAVIGATION N/A 08/05/2020   Procedure: APPLICATION OF CRANIAL NAVIGATION;  Surgeon: Van Gelinas, MD;  Location: [facility] OR;  Service: Neurosurgery;  Laterality: N/A;   CRANIOTOMY N/A 08/05/2020   Procedure: CRANIOTOMY TUMOR EXCISION;  Surgeon: Van Gelinas, MD;  Location: [facility] OR;  Service: Neurosurgery;  Laterality: N/A;   MELANOMA EXCISION WITH SENTINEL LYMPH NODE BIOPSY N/A 04/01/2019   Procedure: WIDE LOCAL EXCISION OF BACK MELANOMA WITH RIGHT AXILLARY SENTINEL LYMPH NODE BIOPSY;  Surgeon: Lockie Rima, MD;  Location: [facility] OR;  Service: General;  Laterality: N/A;   SKIN SPLIT GRAFT N/A 04/10/2019   Procedure: SPLIT THICKNESS SKIN GRAFT FROM RIGHT  THIGH TO THE BACK;  Surgeon: Alger Infield, MD;  Location: [facility] OR;  Service: Plastics;  Laterality: N/A;   Social History:  Social History   Socioeconomic History   Marital status: Single    Spouse name: Not on file   Number of children: Not  on file   Years of education: Not on file   Highest education level: Not on file  Occupational History   Not on file  Tobacco Use   Smoking status: Never   Smokeless tobacco: Never  Vaping Use   Vaping status: Never Used  Substance and Sexual Activity   Alcohol use: Never   Drug use: Never   Sexual activity: Not on file  Other Topics Concern   Not on file  Social History Narrative   Not on file   Social Drivers of Health    Financial Resource Strain: Not on file  Food Insecurity: Not on file  Transportation Needs: No Transportation Needs (09/06/2022)   PRAPARE - Transportation    Lack of Transportation (Medical): No    Lack of Transportation (Non-Medical): No  Physical Activity: Not on file  Stress: Not on file  Social Connections: Not on file  Intimate Partner Violence: Not on file   Family History: No family history on file.  Review of Systems: Constitutional: Doesn't report fevers, chills or abnormal weight loss Eyes: Doesn't report blurriness of vision Ears, nose, mouth, throat, and face: Doesn't report sore throat Respiratory: Doesn't report cough, dyspnea or wheezes Cardiovascular: Doesn't report palpitation, chest discomfort  Gastrointestinal:  Doesn't report nausea, constipation, diarrhea GU: Doesn't report incontinence Skin: Doesn't report skin rashes Neurological: Per HPI Musculoskeletal: Doesn't report joint pain Behavioral/Psych: Doesn't report anxiety  Physical Exam: Wt Readings from Last 3 Encounters:  04/27/24 162 lb 14.4 oz (73.9 kg)  12/05/23 167 lb 6.4 oz (75.9 kg)  10/28/23 167 lb 8 oz (76 kg)   Temp Readings from Last 3 Encounters:  04/27/24 98.2 F (36.8 C) (Temporal)  12/05/23 98.3 F (36.8 C) (Temporal)  10/28/23 99.5 F (37.5 C) (Temporal)   BP Readings from Last 3 Encounters:  04/27/24 (!) 145/78  12/05/23 132/65  10/28/23 139/70   Pulse Readings from Last 3 Encounters:  04/27/24 (!) 54  12/05/23 (!) 56  10/28/23 88    KPS: 90. General: Alert, cooperative, pleasant, in no acute distress Head: Normal EENT: No conjunctival injection or scleral icterus.  Lungs: Resp effort normal Cardiac: Regular rate Abdomen: Non-distended abdomen Skin: No rashes cyanosis or petechiae. Extremities: No clubbing or edema  Neurologic Exam: Mental Status: Awake, alert, attentive to examiner. Oriented to self and environment. Language is fluent with intact comprehension.   Cranial Nerves: Visual acuity is grossly normal. Visual fields are full. Extra-ocular movements intact. No ptosis. Face is symmetric Motor: Tone and bulk are normal. Power is full in both arms and legs. Reflexes are symmetric, no pathologic reflexes present.  Sensory: Intact to light touch Gait: Normal.   Labs: I have reviewed the data as listed    Component Value Date/Time   NA 139 11/27/2023 0833   K 4.2 11/27/2023 0833   CL 108 11/27/2023 0833   CO2 25 11/27/2023 0833   GLUCOSE 92 11/27/2023 0833   BUN 28 (H) 11/27/2023 0833   CREATININE 1.12 11/27/2023 0833   CREATININE 1.19 07/27/2020 0836   CALCIUM 9.1 11/27/2023 0833   PROT 6.3 (L) 11/27/2023 0833   ALBUMIN 4.1 11/27/2023 0833   AST 19 11/27/2023 0833   ALT 16 11/27/2023 0833   ALKPHOS 51 11/27/2023 0833   BILITOT 0.6 11/27/2023 0833   GFRNONAA >60 11/27/2023 0833   GFRNONAA 63 07/27/2020 0836   GFRAA >60 08/07/2020 0429   GFRAA 73 07/27/2020 0836   Lab Results  Component Value Date   WBC 6.7 11/27/2023  NEUTROABS 4.8 11/27/2023   HGB 14.5 11/27/2023   HCT 43.2 11/27/2023   MCV 83.1 11/27/2023   PLT 220 11/27/2023    Imaging:  MR BRAIN W WO CONTRAST Result Date: 04/23/2024 CLINICAL DATA:  68 year old male with metastatic melanoma. Treated brain metastases. Resection of a right parietal metastasis in 2021. Progressive increased enhancement at the resection margin over time. Restaging. EXAM: MRI HEAD WITHOUT AND WITH CONTRAST TECHNIQUE: Multiplanar, multiecho pulse sequences of the brain and surrounding structures were obtained without and with intravenous contrast. CONTRAST:  8 mL Vueway  COMPARISON:  Brain MRI 10/24/2023. FINDINGS: Brain: Peripheral nodular and irregular enhancement is more contiguous along the posterior and superior resection cavity since May of 2024. The individual foci measure up to 0 about 7 mm. Since November, progressive thickening and enhancement along the medial superior margin of the  cavity as seen on series 15, image 17. But compared to 1 year ago regional T2 and FLAIR hyperintensity remains stable and there is no significant regional mass effect. Mild ex vacuo enlargement of the atrium of the right lateral ventricle persists. Chronic hemosiderin here is stable. Subtle round fall seen enhancement anteriorly on series 13, image 124 appears unchanged since 11/24/2020 and inconsequential. No other suspicious intracranial enhancement. Martina Sledge and white matter signal elsewhere appears stable and normal for age. No superimposed restricted diffusion to suggest acute infarction. No midline shift, mass effect, ventriculomegaly, extra-axial collection or acute intracranial hemorrhage. Cervicomedullary junction and pituitary are within normal limits. Vascular: Major intracranial vascular flow voids are stable. Skull and upper cervical spine: Right craniotomy. Stable bone marrow signal elsewhere. Small chronic left parietal calvarium FLAIR hyperintense and enhancing bone lesion is stable since 2021 and most likely benign hemangioma. Negative for age visible cervical spine and spinal cord. Sinuses/Orbits: Orbits appear stable and negative. Chronic paranasal sinus mucosal thickening. Other: Mastoids are clear. Visible internal auditory structures appear normal. Negative visible scalp and face. IMPRESSION: Nodular and irregular enhancement along the chronic right parietal lobe resection cavity has increased over time and is significantly more contiguous since May 2024, especially the superomedial margin. However, regional T2/FLAIR hyperintensity remains stable and no mass effect has developed. No new metastatic disease identified. 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 Continued surveillance recommended. Electronically Signed   By: Marlise Simpers M.D.   On: 04/23/2024 10:08     [facility] Clinician Interpretation: I have personally reviewed the radiological images as listed.  My  interpretation, in the context of the patient's clinical presentation, is likely treatment effect    Assessment/Plan Brain Metastasis  Walter Sidle. is clinically stable today.  MRI demonstrates ongoing increase in enhancing volume of 2 nodules adjacent to resection cavity.  Etiology remains suspected post-RT changes, in particular given ongoing collapse of resection cavity.    We discussed and recommended further MRI surveillance.    We again encouraged him to remain active and continue aerobic exercise regimen.  We appreciate the opportunity to participate in the care of Walter Hurn..   We ask that Walter Lesser. return to clinic in 6 months following next brain MRI, or sooner as needed.  All questions were answered. The patient knows to call the clinic with any problems, questions or concerns. No barriers to learning were detected.  The total time spent in the encounter was 30 minutes and more than 50% was on counseling and review of test results   Mamie Searles, MD Medical Director of Neuro-Oncology [facility] 04/27/24 10:05 AM

## 2024-05-05 DIAGNOSIS — C7931 Secondary malignant neoplasm of brain: Secondary | ICD-10-CM | POA: Diagnosis not present

## 2024-05-05 DIAGNOSIS — Z85828 Personal history of other malignant neoplasm of skin: Secondary | ICD-10-CM | POA: Diagnosis not present

## 2024-05-05 DIAGNOSIS — Z85820 Personal history of malignant melanoma of skin: Secondary | ICD-10-CM | POA: Diagnosis not present

## 2024-05-05 DIAGNOSIS — D229 Melanocytic nevi, unspecified: Secondary | ICD-10-CM | POA: Diagnosis not present

## 2024-05-05 DIAGNOSIS — L578 Other skin changes due to chronic exposure to nonionizing radiation: Secondary | ICD-10-CM | POA: Diagnosis not present

## 2024-05-26 ENCOUNTER — Encounter (HOSPITAL_COMMUNITY): Payer: Self-pay

## 2024-05-26 ENCOUNTER — Ambulatory Visit (HOSPITAL_COMMUNITY)
Admission: RE | Admit: 2024-05-26 | Discharge: 2024-05-26 | Disposition: A | Discharge status: Home / Self Care | Source: Ambulatory Visit | Attending: Internal Medicine | Admitting: Internal Medicine

## 2024-05-26 ENCOUNTER — Inpatient Hospital Stay: Attending: Internal Medicine

## 2024-05-26 DIAGNOSIS — Z9221 Personal history of antineoplastic chemotherapy: Secondary | ICD-10-CM | POA: Diagnosis not present

## 2024-05-26 DIAGNOSIS — C439 Malignant melanoma of skin, unspecified: Secondary | ICD-10-CM | POA: Insufficient documentation

## 2024-05-26 DIAGNOSIS — J322 Chronic ethmoidal sinusitis: Secondary | ICD-10-CM | POA: Diagnosis not present

## 2024-05-26 DIAGNOSIS — Z923 Personal history of irradiation: Secondary | ICD-10-CM | POA: Diagnosis not present

## 2024-05-26 DIAGNOSIS — C799 Secondary malignant neoplasm of unspecified site: Secondary | ICD-10-CM | POA: Diagnosis not present

## 2024-05-26 DIAGNOSIS — C7931 Secondary malignant neoplasm of brain: Secondary | ICD-10-CM | POA: Insufficient documentation

## 2024-05-26 DIAGNOSIS — D1803 Hemangioma of intra-abdominal structures: Secondary | ICD-10-CM | POA: Diagnosis not present

## 2024-05-26 DIAGNOSIS — K769 Liver disease, unspecified: Secondary | ICD-10-CM | POA: Diagnosis not present

## 2024-05-26 DIAGNOSIS — Z85820 Personal history of malignant melanoma of skin: Secondary | ICD-10-CM | POA: Insufficient documentation

## 2024-05-26 DIAGNOSIS — E041 Nontoxic single thyroid nodule: Secondary | ICD-10-CM | POA: Diagnosis not present

## 2024-05-26 LAB — CMP (CANCER CENTER ONLY)
ALT: 14 U/L (ref 0–44)
AST: 21 U/L (ref 15–41)
Albumin: 4.4 g/dL (ref 3.5–5.0)
Alkaline Phosphatase: 47 U/L (ref 38–126)
Anion gap: 8 (ref 5–15)
BUN: 27 mg/dL — ABNORMAL HIGH (ref 8–23)
CO2: 23 mmol/L (ref 22–32)
Calcium: 9 mg/dL (ref 8.9–10.3)
Chloride: 108 mmol/L (ref 98–111)
Creatinine: 1.15 mg/dL (ref 0.61–1.24)
GFR, Estimated: >60 mL/min (ref >60)
Glucose, Bld: 108 mg/dL — ABNORMAL HIGH (ref 70–99)
Potassium: 4.5 mmol/L (ref 3.5–5.1)
Sodium: 139 mmol/L (ref 135–145)
Total Bilirubin: 0.5 mg/dL (ref 0.0–1.2)
Total Protein: 7 g/dL (ref 6.5–8.1)

## 2024-05-26 LAB — CBC WITH DIFFERENTIAL (CANCER CENTER ONLY)
Abs Immature Granulocytes: 0.01 10*3/uL (ref 0.00–0.07)
Basophils Absolute: 0 10*3/uL (ref 0.0–0.1)
Basophils Relative: 1 %
Eosinophils Absolute: 0.1 10*3/uL (ref 0.0–0.5)
Eosinophils Relative: 1 %
HCT: 43.4 % (ref 39.0–52.0)
Hemoglobin: 14.7 g/dL (ref 13.0–17.0)
Immature Granulocytes: 0 %
Lymphocytes Relative: 16 %
Lymphs Abs: 1.1 10*3/uL (ref 0.7–4.0)
MCH: 27.9 pg (ref 26.0–34.0)
MCHC: 33.9 g/dL (ref 30.0–36.0)
MCV: 82.4 fL (ref 80.0–100.0)
Monocytes Absolute: 0.3 10*3/uL (ref 0.1–1.0)
Monocytes Relative: 5 %
Neutro Abs: 5.7 10*3/uL (ref 1.7–7.7)
Neutrophils Relative %: 77 %
Platelet Count: 203 10*3/uL (ref 150–400)
RBC: 5.27 MIL/uL (ref 4.22–5.81)
RDW: 13.2 % (ref 11.5–15.5)
WBC Count: 7.3 10*3/uL (ref 4.0–10.5)
nRBC: 0 % (ref 0.0–0.2)

## 2024-05-26 LAB — LACTATE DEHYDROGENASE: LDH: 153 U/L (ref 98–192)

## 2024-05-26 MED ORDER — IOHEXOL 300 MG/ML  SOLN
75.0000 mL | Freq: Once | INTRAMUSCULAR | Status: AC | PRN
  Administered 2024-05-26: 100 mL via INTRAVENOUS

## 2024-05-26 MED ORDER — SODIUM CHLORIDE (PF) 0.9 % IJ SOLN
INTRAMUSCULAR | Status: AC
  Filled 2024-05-26: qty 50

## 2024-06-02 ENCOUNTER — Inpatient Hospital Stay: Admitting: Internal Medicine

## 2024-06-02 VITALS — BP 130/72 | HR 56 | Temp 98.4°F | Resp 16 | Ht 69.0 in | Wt 160.3 lb

## 2024-06-02 DIAGNOSIS — Z9221 Personal history of antineoplastic chemotherapy: Secondary | ICD-10-CM | POA: Diagnosis not present

## 2024-06-02 DIAGNOSIS — C439 Malignant melanoma of skin, unspecified: Secondary | ICD-10-CM

## 2024-06-02 DIAGNOSIS — Z85820 Personal history of malignant melanoma of skin: Secondary | ICD-10-CM | POA: Diagnosis not present

## 2024-06-02 DIAGNOSIS — Z923 Personal history of irradiation: Secondary | ICD-10-CM | POA: Diagnosis not present

## 2024-06-02 DIAGNOSIS — C7931 Secondary malignant neoplasm of brain: Secondary | ICD-10-CM | POA: Diagnosis not present

## 2024-06-02 NOTE — Progress Notes (Signed)
 [facility] [phone]   [phone]  OFFICE PROGRESS NOTE  Teresa Channel, MD [phone] 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  Tumor is BRAF mutation positive  PRIOR THERAPY: 1) status post wide excision with right axillary sentinel lymph node sampling under the care of Dr. 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 2) Status post skin graft on Apr 10, 2019 by Dr. Arelia 3) Status postcraniotomy and excision of brain tumor on August 05, 2020 by Dr. Debby. 4) Status post SRS following the craniotomy completed August 25, 2020 5) Status post treatment with 12 cycles of nivolumab  480 Mg IV every 4 weeks started September 22, 2020 and completed August 2022  CURRENT THERAPY: Observation  INTERVAL HISTORY: Walter Rodriguez. 68 y.o. male returns to the clinic for 6 months follow-up visit.Discussed the use of AI scribe software for clinical note transcription with the patient, who gave verbal consent to proceed.  History of Present Illness   Walter Rodriguez. is a 68 year old male with stage four malignant melanoma who presents for evaluation and repeat CT scan for restaging of his disease.  He has a history of stage four malignant melanoma of the upper back, initially diagnosed as stage three in 2020, with subsequent evidence of brain metastasis. He has undergone several treatments including stereotactic radiosurgery to the brain and excision of the brain tumor.  In August and September 2021, he was treated with twelve cycles of nivolumab  every four weeks, which he completed in August 2022. Since then, he has been under observation.  He feels very well with no complaints since his last visit. No chest pain, breathing issues,  nausea, vomiting, diarrhea, or headaches.      MEDICAL HISTORY: Past Medical History:  Diagnosis Date   Melanoma (HCC)    back   Metastatic cancer to brain (HCC) 07/2020   Open wound    back    ALLERGIES:  has no known allergies.  MEDICATIONS:  Current Outpatient Medications  Medication Sig Dispense Refill   Multiple Vitamin (MULTI-VITAMIN) tablet Take 1 tablet by mouth daily.     No current facility-administered medications for this visit.    SURGICAL HISTORY:  Past Surgical History:  Procedure Laterality Date   APPLICATION OF CRANIAL NAVIGATION N/A 08/05/2020   Procedure: APPLICATION OF CRANIAL NAVIGATION;  Surgeon: Debby Dorn MATSU, MD;  Location: [facility] OR;  Service: Neurosurgery;  Laterality: N/A;   CRANIOTOMY N/A 08/05/2020   Procedure: CRANIOTOMY TUMOR EXCISION;  Surgeon: Debby Dorn MATSU, MD;  Location: [facility] OR;  Service: Neurosurgery;  Laterality: N/A;   MELANOMA EXCISION WITH SENTINEL LYMPH NODE BIOPSY N/A 04/01/2019   Procedure: WIDE LOCAL EXCISION OF BACK MELANOMA WITH RIGHT AXILLARY SENTINEL LYMPH NODE BIOPSY;  Surgeon: Aron Shoulders, MD;  Location: [facility] OR;  Service: General;  Laterality: N/A;   SKIN SPLIT GRAFT N/A 04/10/2019   Procedure: SPLIT THICKNESS SKIN GRAFT FROM RIGHT  THIGH TO THE BACK;  Surgeon: Arelia Filippo, MD;  Location: [facility] OR;  Service: Plastics;  Laterality: N/A;    REVIEW OF SYSTEMS:  A comprehensive review of systems was negative.   PHYSICAL EXAMINATION: General appearance: alert, cooperative, and no distress Head: Normocephalic, without obvious abnormality, atraumatic Neck: no adenopathy,  no JVD, supple, symmetrical, trachea midline, and thyroid  not enlarged, symmetric, no tenderness/mass/nodules Lymph nodes: Cervical, supraclavicular, and axillary nodes normal. Resp: clear to auscultation bilaterally Back: symmetric, no curvature. ROM normal. No CVA tenderness. Cardio: regular rate and rhythm, S1, S2 normal, no murmur, click, rub or  gallop GI: soft, non-tender; bowel sounds normal; no masses,  no organomegaly Extremities: extremities normal, atraumatic, no cyanosis or edema  ECOG PERFORMANCE STATUS: 1 - Symptomatic but completely ambulatory  Blood pressure 130/72, pulse (!) 56, temperature 98.4 F (36.9 C), temperature source Oral, resp. rate 16, height 5' 9 (1.753 m), weight 160 lb 4.8 oz (72.7 kg), SpO2 95%.  LABORATORY DATA: Lab Results  Component Value Date   WBC 7.3 05/26/2024   HGB 14.7 05/26/2024   HCT 43.4 05/26/2024   MCV 82.4 05/26/2024   PLT 203 05/26/2024      Chemistry      Component Value Date/Time   NA 139 05/26/2024 0857   K 4.5 05/26/2024 0857   CL 108 05/26/2024 0857   CO2 23 05/26/2024 0857   BUN 27 (H) 05/26/2024 0857   CREATININE 1.15 05/26/2024 0857   CREATININE 1.19 07/27/2020 0836      Component Value Date/Time   CALCIUM 9.0 05/26/2024 0857   ALKPHOS 47 05/26/2024 0857   AST 21 05/26/2024 0857   ALT 14 05/26/2024 0857   BILITOT 0.5 05/26/2024 0857       RADIOGRAPHIC STUDIES: CT Chest W Contrast Result Date: 06/01/2024 CLINICAL DATA:  Staging melanoma. Completed XRT. On immunotherapy. * Tracking Code: BO * EXAM: CT CHEST, ABDOMEN, AND PELVIS WITH CONTRAST TECHNIQUE: Multidetector CT imaging of the chest, abdomen and pelvis was performed following the standard protocol during bolus administration of intravenous contrast. RADIATION DOSE REDUCTION: This exam was performed according to the departmental dose-optimization program which includes automated exposure control, adjustment of the mA and/or kV according to patient size and/or use of iterative reconstruction technique. CONTRAST:  OMNIPAQUE  IOHEXOL  300 MG/ML  SOLN COMPARISON:  CT 11/27/2023 and older FINDINGS: CT CHEST FINDINGS Cardiovascular: Heart is nonenlarged. No pericardial effusion. The thoracic aorta is normal course and caliber with mild atherosclerotic calcified plaque calcification along the aortic valve.  Mediastinum/Nodes: Slightly patulous thoracic esophagus. Small hiatal hernia. Stable right thyroid  nodule. Please correlate with previous thyroid  ultrasound. Surgical clips with thickening in the right axillary region. No specific abnormal lymph node enlargement identified in the axillary regions, hilum or mediastinum. Lungs/Pleura: No consolidation, pneumothorax or effusion. No dominant lung nodule. Slight apical pleural thickening. Musculoskeletal: Mild degenerative changes along the spine. Stable sclerotic focus along the posterior aspect of the right eighth rib, possible bone island. Few other tiny areas as well. CT ABDOMEN PELVIS FINDINGS Hepatobiliary: Segment 7 low-attenuation liver lesion again identified. This measures 7 mm. By prior MRI and history this has been described as a hemangioma. No other space-occupying liver lesion identified at this time. Patent portal vein. Gallbladder is nondilated. Pancreas: Unremarkable. No pancreatic ductal dilatation or surrounding inflammatory changes. Spleen: Normal in size without focal abnormality. Adrenals/Urinary Tract: The adrenal glands are preserved. Bilateral Bosniak 1 renal cysts are again identified. The lesion that is slightly more complex is also stable in the anterior upper pole right kidney with a small marginal calcification best seen on coronal image 74. Bosniak 2 lesion. The ureters have normal course and caliber down to the bladder. Preserved contour to the urinary bladder. Bladder wall slightly thickened and trabeculated. There is an enlarged prostate. Please correlate with patient's PSA.  Stomach/Bowel: Stomach is underdistended. Third portion duodenal diverticulum. Large bowel has a normal course and caliber with scattered stool. Normal appendix seen retrocecal. There is some distal ileal small bowel stool appearance, nonspecific. The DG in ileum are nondilated. Vascular/Lymphatic: Aortic atherosclerosis. No enlarged abdominal or pelvic lymph nodes.  Reproductive: Enlarged prostate with mass effect along the base of the bladder. Please correlate with patient's PSA Other: No free air or free fluid. Musculoskeletal: Scattered degenerative changes along the spine. Multilevel disc bulging. Slight curvature. IMPRESSION: No developing new mass lesion, fluid collection or lymph node enlargement. Stable small segment 7 hepatic lesion previously described as a potential hemangioma. Stable right thyroid  lesion. Please correlate with previous ultrasound. Enlarged prostate.  Please correlate with the patient's PSA. Electronically Signed   By: Ranell Bring M.D.   On: 06/01/2024 12:29   CT ABDOMEN PELVIS W CONTRAST Result Date: 06/01/2024 CLINICAL DATA:  Staging melanoma. Completed XRT. On immunotherapy. * Tracking Code: BO * EXAM: CT CHEST, ABDOMEN, AND PELVIS WITH CONTRAST TECHNIQUE: Multidetector CT imaging of the chest, abdomen and pelvis was performed following the standard protocol during bolus administration of intravenous contrast. RADIATION DOSE REDUCTION: This exam was performed according to the departmental dose-optimization program which includes automated exposure control, adjustment of the mA and/or kV according to patient size and/or use of iterative reconstruction technique. CONTRAST:  OMNIPAQUE  IOHEXOL  300 MG/ML  SOLN COMPARISON:  CT 11/27/2023 and older FINDINGS: CT CHEST FINDINGS Cardiovascular: Heart is nonenlarged. No pericardial effusion. The thoracic aorta is normal course and caliber with mild atherosclerotic calcified plaque calcification along the aortic valve. Mediastinum/Nodes: Slightly patulous thoracic esophagus. Small hiatal hernia. Stable right thyroid  nodule. Please correlate with previous thyroid  ultrasound. Surgical clips with thickening in the right axillary region. No specific abnormal lymph node enlargement identified in the axillary regions, hilum or mediastinum. Lungs/Pleura: No consolidation, pneumothorax or effusion. No  dominant lung nodule. Slight apical pleural thickening. Musculoskeletal: Mild degenerative changes along the spine. Stable sclerotic focus along the posterior aspect of the right eighth rib, possible bone island. Few other tiny areas as well. CT ABDOMEN PELVIS FINDINGS Hepatobiliary: Segment 7 low-attenuation liver lesion again identified. This measures 7 mm. By prior MRI and history this has been described as a hemangioma. No other space-occupying liver lesion identified at this time. Patent portal vein. Gallbladder is nondilated. Pancreas: Unremarkable. No pancreatic ductal dilatation or surrounding inflammatory changes. Spleen: Normal in size without focal abnormality. Adrenals/Urinary Tract: The adrenal glands are preserved. Bilateral Bosniak 1 renal cysts are again identified. The lesion that is slightly more complex is also stable in the anterior upper pole right kidney with a small marginal calcification best seen on coronal image 74. Bosniak 2 lesion. The ureters have normal course and caliber down to the bladder. Preserved contour to the urinary bladder. Bladder wall slightly thickened and trabeculated. There is an enlarged prostate. Please correlate with patient's PSA. Stomach/Bowel: Stomach is underdistended. Third portion duodenal diverticulum. Large bowel has a normal course and caliber with scattered stool. Normal appendix seen retrocecal. There is some distal ileal small bowel stool appearance, nonspecific. The DG in ileum are nondilated. Vascular/Lymphatic: Aortic atherosclerosis. No enlarged abdominal or pelvic lymph nodes. Reproductive: Enlarged prostate with mass effect along the base of the bladder. Please correlate with patient's PSA Other: No free air or free fluid. Musculoskeletal: Scattered degenerative changes along the spine. Multilevel disc bulging. Slight curvature. IMPRESSION: No developing new mass lesion, fluid collection or lymph node enlargement. Stable small segment 7 hepatic lesion  previously described as a potential hemangioma. Stable right thyroid  lesion. Please correlate with previous ultrasound. Enlarged prostate.  Please correlate with the patient's PSA. Electronically Signed   By: Ranell Bring M.D.   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  Tumor is BRAF mutation positive He is status post the following treatment: 1) status post wide excision with right axillary sentinel lymph node sampling under the care of Dr. 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 2) Status post skin graft on Apr 10, 2019 by Dr. Arelia 3) Status postcraniotomy and excision of brain tumor on August 05, 2020 by Dr. Debby. 4) Status post SRS following the craniotomy completed August 25, 2020 5) Status post treatment with 12 cycles of nivolumab  480 Mg IV every 4 weeks started September 22, 2020 and completed August 2022.  The patient has been in observation since that time and he is feeling fine. He had repeat CT scan of the chest, abdomen and pelvis performed recently.  I personally independently reviewed the scan and discussed the result with the patient today.  His scan showed no concerning findings for disease recurrence or metastasis. 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 Status post several treatments including SRS to the brain and excision of the brain tumor. Completed 12 cycles of nivolumab  in August 2022 and has been on observation since. Recent CT scan of the chest, abdomen, and pelvis shows no evidence of new mass lesions. Well-managed 7 mm hepatic lesions described as potential hemangioma.  He reports feeling well with no complaints such as chest pain, dyspnea, nausea, vomiting, diarrhea, or headaches. - Continue observation - Schedule follow-up in 6 months with lab work and CT scan of the chest, abdomen, and pelvis   The patient was advised to call immediately if he has any concerning symptoms in the interval.  The patient voices understanding of current disease status and treatment options and is in agreement with the current care plan.  All questions were answered. The patient knows to call the clinic with any problems, questions or concerns. We can certainly see the patient much sooner if necessary. The total time spent in the appointment was 20 minutes.  Disclaimer: This note was dictated with voice recognition software. Similar sounding words can inadvertently be transcribed and may not be corrected upon review.

## 2024-06-05 ENCOUNTER — Telehealth: Payer: Self-pay | Admitting: Internal Medicine

## 2024-06-05 NOTE — Telephone Encounter (Signed)
Scheduled appointments with the patient

## 2024-09-24 DIAGNOSIS — R7303 Prediabetes: Secondary | ICD-10-CM | POA: Diagnosis not present

## 2024-09-24 DIAGNOSIS — C439 Malignant melanoma of skin, unspecified: Secondary | ICD-10-CM | POA: Diagnosis not present

## 2024-09-24 DIAGNOSIS — Z0000 Encounter for general adult medical examination without abnormal findings: Secondary | ICD-10-CM | POA: Diagnosis not present

## 2024-09-24 DIAGNOSIS — E785 Hyperlipidemia, unspecified: Secondary | ICD-10-CM | POA: Diagnosis not present

## 2024-09-24 DIAGNOSIS — I700 Atherosclerosis of aorta: Secondary | ICD-10-CM | POA: Diagnosis not present

## 2024-09-24 DIAGNOSIS — R030 Elevated blood-pressure reading, without diagnosis of hypertension: Secondary | ICD-10-CM | POA: Diagnosis not present

## 2024-10-22 ENCOUNTER — Ambulatory Visit
Admission: RE | Admit: 2024-10-22 | Discharge: 2024-10-22 | Disposition: A | Discharge status: Home / Self Care | Source: Ambulatory Visit | Attending: Internal Medicine | Admitting: Internal Medicine

## 2024-10-22 DIAGNOSIS — C7931 Secondary malignant neoplasm of brain: Secondary | ICD-10-CM

## 2024-10-22 MED ORDER — GADOPICLENOL 0.5 MMOL/ML IV SOLN
7.0000 mL | Freq: Once | INTRAVENOUS | Status: AC | PRN
  Administered 2024-10-22: 7 mL via INTRAVENOUS

## 2024-10-26 ENCOUNTER — Inpatient Hospital Stay

## 2024-10-26 ENCOUNTER — Inpatient Hospital Stay: Attending: Internal Medicine | Admitting: Internal Medicine

## 2024-10-26 VITALS — BP 131/71 | HR 60 | Temp 98.2°F | Resp 17 | Ht 69.0 in | Wt 165.8 lb

## 2024-10-26 DIAGNOSIS — C7931 Secondary malignant neoplasm of brain: Secondary | ICD-10-CM

## 2024-10-26 NOTE — Progress Notes (Signed)
 [facility] [address]  [place], [place] [address] [phone]   Interval Evaluation  Date of Service: 10/26/24 Patient Name: Walter Rodriguez. Patient MRN: [id number] Patient DOB: 1956-03-31 Provider: Arthea MARLA Manns, MD  Identifying Statement:  Walter Rodriguez. is a 68 y.o. male with Metastasis to brain [facility]) [C79.31]   Primary Cancer:  Oncologic History: Oncology History  Melanoma of skin (HCC)  09/07/2020 Initial Diagnosis   Melanoma of skin (HCC)   09/22/2020 - 07/28/2021 Chemotherapy   Patient is on Treatment Plan : LUNG Nivolumab  q28d     07/28/2021 Cancer Staging   Staging form: Melanoma of the Skin, AJCC 8th Edition - Clinical: Stage IV (cTX, cNX, pM1a(1)) - Signed by Amadeo Windell SAILOR, MD on 07/28/2021   Metastasis to brain [facility])  08/05/2020 Surgery   Craniotomy, resection of 4.7cm R parietal mass with Dr. Debby; path c/w melanoma    - 08/31/2020 Radiation Therapy   Completes 27Gy/82fx to resection cavity with Dr. Dewey     Interval History:  Walter Rodriguez. presents today for follow up after recent MRI brain.  No clinical changes reported today.  Continues on surveillance for melanoma.  Maintains aerobic exercise regimen, walking and cycling.  H+P (11/29/20) Patient presented to clinical attention in August 2021 with several weeks history of left arm and leg weakness.  He experienced a fall at home and was unable to get up, which prompted evaluation and CNS imaging.  MRI demonstrated large enhancing mass within right parietal lobe consistent with metastatic disease.  He underwent craniotomy and gross resection with Dr. Debby on 08/05/20, which was followed by post-op SRS in 3 fractions with Dr. Dewey, completed on 08/31/20.  He has completed decadron  taper.  At present time he has no motor complaints and no subjective neurologic deficits.  Is undergoing rx with Nivolumab  with Dr. Amadeo.  Very active at home, walks and  cycles every day.  Medications: Current Outpatient Medications on File Prior to Visit  Medication Sig Dispense Refill   Multiple Vitamin (MULTI-VITAMIN) tablet Take 1 tablet by mouth daily.     No current facility-administered medications on file prior to visit.    Allergies: No Known Allergies Past Medical History:  Past Medical History:  Diagnosis Date   Melanoma (HCC)    back   Metastatic cancer to brain (HCC) 07/2020   Open wound    back   Past Surgical History:  Past Surgical History:  Procedure Laterality Date   APPLICATION OF CRANIAL NAVIGATION N/A 08/05/2020   Procedure: APPLICATION OF CRANIAL NAVIGATION;  Surgeon: Debby Dorn MATSU, MD;  Location: [facility] OR;  Service: Neurosurgery;  Laterality: N/A;   CRANIOTOMY N/A 08/05/2020   Procedure: CRANIOTOMY TUMOR EXCISION;  Surgeon: Debby Dorn MATSU, MD;  Location: [facility] OR;  Service: Neurosurgery;  Laterality: N/A;   MELANOMA EXCISION WITH SENTINEL LYMPH NODE BIOPSY N/A 04/01/2019   Procedure: WIDE LOCAL EXCISION OF BACK MELANOMA WITH RIGHT AXILLARY SENTINEL LYMPH NODE BIOPSY;  Surgeon: Aron Shoulders, MD;  Location: [facility] OR;  Service: General;  Laterality: N/A;   SKIN SPLIT GRAFT N/A 04/10/2019   Procedure: SPLIT THICKNESS SKIN GRAFT FROM RIGHT  THIGH TO THE BACK;  Surgeon: Arelia Filippo, MD;  Location: [facility] OR;  Service: Plastics;  Laterality: N/A;   Social History:  Social History   Socioeconomic History   Marital status: Single    Spouse name: Not on file   Number of children: Not  on file   Years of education: Not on file   Highest education level: Not on file  Occupational History   Not on file  Tobacco Use   Smoking status: Never   Smokeless tobacco: Never  Vaping Use   Vaping status: Never Used  Substance and Sexual Activity   Alcohol use: Never   Drug use: Never   Sexual activity: Not on file  Other Topics Concern   Not on file  Social History Narrative   Not on file   Social Drivers of Health   Financial  Resource Strain: Not on file  Food Insecurity: Not on file  Transportation Needs: No Transportation Needs (09/06/2022)   PRAPARE - Transportation    Lack of Transportation (Medical): No    Lack of Transportation (Non-Medical): No  Physical Activity: Not on file  Stress: Not on file  Social Connections: Not on file  Intimate Partner Violence: Not on file   Family History: No family history on file.  Review of Systems: Constitutional: Doesn't report fevers, chills or abnormal weight loss Eyes: Doesn't report blurriness of vision Ears, nose, mouth, throat, and face: Doesn't report sore throat Respiratory: Doesn't report cough, dyspnea or wheezes Cardiovascular: Doesn't report palpitation, chest discomfort  Gastrointestinal:  Doesn't report nausea, constipation, diarrhea GU: Doesn't report incontinence Skin: Doesn't report skin rashes Neurological: Per HPI Musculoskeletal: Doesn't report joint pain Behavioral/Psych: Doesn't report anxiety  Physical Exam: Wt Readings from Last 3 Encounters:  10/26/24 165 lb 12.8 oz (75.2 kg)  06/02/24 160 lb 4.8 oz (72.7 kg)  04/27/24 162 lb 14.4 oz (73.9 kg)   Temp Readings from Last 3 Encounters:  10/26/24 98.2 F (36.8 C) (Oral)  06/02/24 98.4 F (36.9 C) (Oral)  04/27/24 98.2 F (36.8 C) (Temporal)   BP Readings from Last 3 Encounters:  10/26/24 131/71  06/02/24 130/72  04/27/24 (!) 145/78   Pulse Readings from Last 3 Encounters:  10/26/24 60  06/02/24 (!) 56  04/27/24 (!) 54    KPS: 90. General: Alert, cooperative, pleasant, in no acute distress Head: Normal EENT: No conjunctival injection or scleral icterus.  Lungs: Resp effort normal Cardiac: Regular rate Abdomen: Non-distended abdomen Skin: No rashes cyanosis or petechiae. Extremities: No clubbing or edema  Neurologic Exam: Mental Status: Awake, alert, attentive to examiner. Oriented to self and environment. Language is fluent with intact comprehension.  Cranial  Nerves: Visual acuity is grossly normal. Visual fields are full. Extra-ocular movements intact. No ptosis. Face is symmetric Motor: Tone and bulk are normal. Power is full in both arms and legs. Reflexes are symmetric, no pathologic reflexes present.  Sensory: Intact to light touch Gait: Normal.   Labs: I have reviewed the data as listed    Component Value Date/Time   NA 139 05/26/2024 0857   K 4.5 05/26/2024 0857   CL 108 05/26/2024 0857   CO2 23 05/26/2024 0857   GLUCOSE 108 (H) 05/26/2024 0857   BUN 27 (H) 05/26/2024 0857   CREATININE 1.15 05/26/2024 0857   CREATININE 1.19 07/27/2020 0836   CALCIUM 9.0 05/26/2024 0857   PROT 7.0 05/26/2024 0857   ALBUMIN 4.4 05/26/2024 0857   AST 21 05/26/2024 0857   ALT 14 05/26/2024 0857   ALKPHOS 47 05/26/2024 0857   BILITOT 0.5 05/26/2024 0857   GFRNONAA >60 05/26/2024 0857   GFRNONAA 63 07/27/2020 0836   GFRAA >60 08/07/2020 0429   GFRAA 73 07/27/2020 0836   Lab Results  Component Value Date   WBC 7.3 05/26/2024  NEUTROABS 5.7 05/26/2024   HGB 14.7 05/26/2024   HCT 43.4 05/26/2024   MCV 82.4 05/26/2024   PLT 203 05/26/2024    Imaging:  MR BRAIN W WO CONTRAST Result Date: 10/22/2024 EXAM: MRI BRAIN WITH AND WITHOUT CONTRAST 10/22/2024 09:54:25 AM TECHNIQUE: Multiplanar multisequence MRI of the head/brain was performed with and without the administration of intravenous contrast. COMPARISON: MRI of the head dated 04/23/2024 and 10/24/2023. CLINICAL HISTORY: Brain/CNS neoplasm, assess treatment response. FINDINGS: BRAIN AND VENTRICLES: No acute infarct. No acute intracranial hemorrhage. No mass effect or midline shift. No hydrocephalus. The sella is unremarkable. Normal flow voids. Status post right parietal craniotomy for resection of a metastatic lesion within the right parietal lobe. There is continued progression of irregular enhancement of the right parietal resection cavity. There is no discrete nodular enhancement. There is no  significant change in the extent or appearance of T2 and FLAIR hyperintensity within the adjacent cerebral white matter. There are no other lesions evident. ORBITS: No acute abnormality. SINUSES: Mild mucosal disease within the ethmoid sinuses and right maxillary sinus. BONES AND SOFT TISSUES: Normal bone marrow signal and enhancement. No acute soft tissue abnormality. IMPRESSION: 1. Status post right parietal craniotomy for resection of a metastatic lesion within the right parietal lobe. 2. Continued progression of irregular enhancement of the right parietal resection cavity. No discrete nodular enhancement. No significant change in the extent or appearance of T2 and FLAIR hyperintensity within the adjacent cerebral white matter. No significant mass effect. No other lesions evident. It is not clear whether this represents radiation necrosis or residual or recurrent neoplastic disease. Electronically signed by: Evalene Coho MD 10/22/2024 10:47 AM EST RP Workstation: [id number]     [facility] Clinician Interpretation: I have personally reviewed the radiological images as listed.  My interpretation, in the context of the patient's clinical presentation, is treatment effect vs true progression    Assessment/Plan Brain Metastasis  Walter Rodriguez. is clinically stable today.  MRI demonstrates increase in burden of enhancement within treated right parietal region.  Etiology is unclear; this could represent localized recurrence of melanoma, radiation necrosis, or artifactual enhancement from surgical cavity collapse.  Case was reviewed in detail in CNS tumor board.  Discussed and offered either repeating SRS, or continuing closer imaging surveillance with repeat MRI brain in 2-3 months.  Patient elected for further MRI surveillance.    We again encouraged him to remain active and continue aerobic exercise regimen.  We appreciate the opportunity to participate in the care of Xavious Sharrar..   We ask  that Walter Rodriguez. return to clinic in 2-3 months following next brain MRI, or sooner as needed.  All questions were answered. The patient knows to call the clinic with any problems, questions or concerns. No barriers to learning were detected.  The total time spent in the encounter was 40 minutes and more than 50% was on counseling and review of test results   Arthea MARLA Manns, MD Medical Director of Neuro-Oncology [facility] 10/26/24 9:48 AM

## 2024-10-27 ENCOUNTER — Telehealth: Payer: Self-pay | Admitting: Internal Medicine

## 2024-10-27 NOTE — Telephone Encounter (Signed)
 Scheduled patient for next appointment. Called and spoke with the patient, he is aware.

## 2024-11-23 ENCOUNTER — Inpatient Hospital Stay: Attending: Internal Medicine

## 2024-11-23 ENCOUNTER — Other Ambulatory Visit: Payer: Self-pay | Admitting: Internal Medicine

## 2024-11-23 ENCOUNTER — Ambulatory Visit (HOSPITAL_COMMUNITY)
Admission: RE | Admit: 2024-11-23 | Discharge: 2024-11-23 | Disposition: A | Discharge status: Home / Self Care | Source: Ambulatory Visit | Attending: Internal Medicine | Admitting: Internal Medicine

## 2024-11-23 DIAGNOSIS — C7931 Secondary malignant neoplasm of brain: Secondary | ICD-10-CM | POA: Insufficient documentation

## 2024-11-23 DIAGNOSIS — C439 Malignant melanoma of skin, unspecified: Secondary | ICD-10-CM | POA: Insufficient documentation

## 2024-11-23 DIAGNOSIS — C4359 Malignant melanoma of other part of trunk: Secondary | ICD-10-CM | POA: Insufficient documentation

## 2024-11-23 LAB — CBC WITH DIFFERENTIAL (CANCER CENTER ONLY)
Abs Immature Granulocytes: 0.02 K/uL (ref 0.00–0.07)
Basophils Absolute: 0 K/uL (ref 0.0–0.1)
Basophils Relative: 1 %
Eosinophils Absolute: 0.1 K/uL (ref 0.0–0.5)
Eosinophils Relative: 2 %
HCT: 44.2 % (ref 39.0–52.0)
Hemoglobin: 14.8 g/dL (ref 13.0–17.0)
Immature Granulocytes: 0 %
Lymphocytes Relative: 16 %
Lymphs Abs: 1.1 K/uL (ref 0.7–4.0)
MCH: 27.5 pg (ref 26.0–34.0)
MCHC: 33.5 g/dL (ref 30.0–36.0)
MCV: 82 fL (ref 80.0–100.0)
Monocytes Absolute: 0.4 K/uL (ref 0.1–1.0)
Monocytes Relative: 6 %
Neutro Abs: 5.4 K/uL (ref 1.7–7.7)
Neutrophils Relative %: 75 %
Platelet Count: 200 K/uL (ref 150–400)
RBC: 5.39 MIL/uL (ref 4.22–5.81)
RDW: 13.3 % (ref 11.5–15.5)
WBC Count: 7.1 K/uL (ref 4.0–10.5)
nRBC: 0 % (ref 0.0–0.2)

## 2024-11-23 LAB — CMP (CANCER CENTER ONLY)
ALT: 20 U/L (ref 0–44)
AST: 25 U/L (ref 15–41)
Albumin: 4.5 g/dL (ref 3.5–5.0)
Alkaline Phosphatase: 58 U/L (ref 38–126)
Anion gap: 11 (ref 5–15)
BUN: 25 mg/dL — ABNORMAL HIGH (ref 8–23)
CO2: 25 mmol/L (ref 22–32)
Calcium: 9.4 mg/dL (ref 8.9–10.3)
Chloride: 105 mmol/L (ref 98–111)
Creatinine: 1.1 mg/dL (ref 0.61–1.24)
GFR, Estimated: >60 mL/min (ref >60)
Glucose, Bld: 95 mg/dL (ref 70–99)
Potassium: 4.5 mmol/L (ref 3.5–5.1)
Sodium: 141 mmol/L (ref 135–145)
Total Bilirubin: 0.5 mg/dL (ref 0.0–1.2)
Total Protein: 7 g/dL (ref 6.5–8.1)

## 2024-11-23 LAB — LACTATE DEHYDROGENASE: LDH: 184 U/L (ref 105–235)

## 2024-11-23 MED ORDER — SODIUM CHLORIDE (PF) 0.9 % IJ SOLN
INTRAMUSCULAR | Status: AC
  Filled 2024-11-23: qty 50

## 2024-11-23 MED ORDER — IOHEXOL 300 MG/ML  SOLN
100.0000 mL | Freq: Once | INTRAMUSCULAR | Status: AC | PRN
  Administered 2024-11-23: 09:00:00 100 mL via INTRAVENOUS

## 2024-12-01 ENCOUNTER — Inpatient Hospital Stay: Admitting: Internal Medicine

## 2024-12-01 VITALS — BP 146/77 | HR 69 | Temp 98.1°F | Resp 17 | Ht 69.0 in | Wt 169.0 lb

## 2024-12-01 DIAGNOSIS — C3490 Malignant neoplasm of unspecified part of unspecified bronchus or lung: Secondary | ICD-10-CM | POA: Diagnosis not present

## 2024-12-01 DIAGNOSIS — C7931 Secondary malignant neoplasm of brain: Secondary | ICD-10-CM | POA: Diagnosis not present

## 2024-12-01 NOTE — Progress Notes (Signed)
 "     [facility] Telephone:([phone]) [phone]   Fax:([phone]  OFFICE PROGRESS NOTE  Teresa Channel, MD [phone] 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  Tumor is BRAF mutation positive  PRIOR THERAPY: 1) status post wide excision with right axillary sentinel lymph node sampling under the care of Dr. 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 2) Status post skin graft on Apr 10, 2019 by Dr. Arelia 3) Status postcraniotomy and excision of brain tumor on August 05, 2020 by Dr. Debby. 4) Status post SRS following the craniotomy completed August 25, 2020 5) Status post treatment with 12 cycles of nivolumab  480 Mg IV every 4 weeks started September 22, 2020 and completed August 2022  CURRENT THERAPY: Observation  INTERVAL HISTORY: [name]. 68 y.o. male returns to the clinic for 6 months follow-up visit.Discussed the use of AI scribe software for clinical note transcription with the patient, who gave verbal consent to proceed.  History of Present Illness Lemoyne Nestor. 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 He has been under observation since completion of therapy.  Since his last visit in June 2025, the patient reports no chest pain, dyspnea, cough, hemoptysis, lymphadenopathy, headache, visual changes, or focal neurological deficits. Weight has remained stable between 160 and 165 pounds, with no unintentional weight loss.  He  expressed concern regarding the financial burden of frequent imaging, noting increased copayments, and discussed the possibility of extending the interval between CT scans to annually. He also inquired about the risk of radiation-induced malignancy from repeated CT imaging.   MEDICAL HISTORY: Past Medical History:  Diagnosis Date   Melanoma (HCC)    back   Metastatic cancer to brain (HCC) 07/2020   Open wound    back    ALLERGIES:  has no known allergies.  MEDICATIONS:  Current Outpatient Medications  Medication Sig Dispense Refill   Multiple Vitamin (MULTI-VITAMIN) tablet Take 1 tablet by mouth daily.     No current facility-administered medications for this visit.    SURGICAL HISTORY:  Past Surgical History:  Procedure Laterality Date   APPLICATION OF CRANIAL NAVIGATION N/A 08/05/2020   Procedure: APPLICATION OF CRANIAL NAVIGATION;  Surgeon: Debby Dorn MATSU, MD;  Location: [facility] OR;  Service: Neurosurgery;  Laterality: N/A;   CRANIOTOMY N/A 08/05/2020   Procedure: CRANIOTOMY TUMOR EXCISION;  Surgeon: Debby Dorn MATSU, MD;  Location: [facility] OR;  Service: Neurosurgery;  Laterality: N/A;   MELANOMA EXCISION WITH SENTINEL LYMPH NODE BIOPSY N/A 04/01/2019   Procedure: WIDE LOCAL EXCISION OF BACK MELANOMA WITH RIGHT AXILLARY SENTINEL LYMPH NODE BIOPSY;  Surgeon: Aron Shoulders, MD;  Location: [facility] OR;  Service: General;  Laterality: N/A;   SKIN SPLIT GRAFT N/A 04/10/2019   Procedure: SPLIT THICKNESS SKIN GRAFT FROM RIGHT  THIGH TO THE BACK;  Surgeon: Arelia Filippo, MD;  Location: [facility] OR;  Service: Plastics;  Laterality: N/A;    REVIEW OF SYSTEMS:  A comprehensive review of systems was negative.  PHYSICAL EXAMINATION: General appearance: alert, cooperative, and no distress Head: Normocephalic, without obvious abnormality, atraumatic Neck: no adenopathy, no JVD, supple, symmetrical, trachea midline, and thyroid  not enlarged, symmetric, no tenderness/mass/nodules Lymph nodes: Cervical,  supraclavicular, and axillary nodes normal. Resp: clear to auscultation bilaterally Back: symmetric, no curvature. ROM normal. No CVA tenderness. Cardio: regular rate and rhythm, S1, S2 normal, no murmur, click, rub or gallop GI: soft, non-tender; bowel sounds normal; no masses,  no organomegaly Extremities: extremities normal, atraumatic, no cyanosis or edema  ECOG PERFORMANCE STATUS: 1 - Symptomatic but completely ambulatory  Blood pressure (!) 146/77, pulse 69, temperature 98.1 F (36.7 C), temperature source Temporal, resp. rate 17, height 5' 9 (1.753 m), weight 169 lb (76.7 kg).  LABORATORY DATA: Lab Results  Component Value Date   WBC 7.1 11/23/2024   HGB 14.8 11/23/2024   HCT 44.2 11/23/2024   MCV 82.0 11/23/2024   PLT 200 11/23/2024      Chemistry      Component Value Date/Time   NA 141 11/23/2024 0735   K 4.5 11/23/2024 0735   CL 105 11/23/2024 0735   CO2 25 11/23/2024 0735   BUN 25 (H) 11/23/2024 0735   CREATININE 1.10 11/23/2024 0735   CREATININE 1.19 07/27/2020 0836      Component Value Date/Time   CALCIUM 9.4 11/23/2024 0735   ALKPHOS 58 11/23/2024 0735   AST 25 11/23/2024 0735   ALT 20 11/23/2024 0735   BILITOT 0.5 11/23/2024 0735       RADIOGRAPHIC STUDIES: CT CHEST ABDOMEN PELVIS W CONTRAST Result Date: 11/23/2024 CLINICAL DATA:  Melanoma, stage IIB/III/IV, monitor. * Tracking Code: BO * EXAM: CT CHEST, ABDOMEN, AND PELVIS WITH CONTRAST TECHNIQUE: Multidetector CT imaging of the chest, abdomen and pelvis was performed following the standard protocol during bolus administration of intravenous contrast. RADIATION DOSE REDUCTION: This exam was performed according to the departmental dose-optimization program which includes automated exposure control, adjustment of the mA and/or kV according to patient size and/or use of iterative reconstruction technique. CONTRAST:  OMNIPAQUE  IOHEXOL  300 MG/ML  SOLN COMPARISON:  CT scan chest, abdomen and pelvis from  05/26/2024. FINDINGS: CT CHEST FINDINGS Cardiovascular: Normal cardiac size. No pericardial effusion. No aortic aneurysm. There are mild peripheral atherosclerotic vascular calcifications of thoracic aorta and its major branches. Mediastinum/Nodes: Redemonstration of asymmetrically enlarged right thyroid  lobe with central ill-defined hypoattenuating nodule, not well evaluated on the current exam but unchanged since prior study. Please refer to recent prior CT scan neck report for additional details. No solid / cystic mediastinal masses. The esophagus is nondistended precluding optimal assessment. No axillary, mediastinal or hilar lymphadenopathy by size criteria. Lungs/Pleura: The central tracheo-bronchial tree is patent. No mass or consolidation. No pleural effusion or pneumothorax. No suspicious lung nodules. Musculoskeletal: The visualized soft tissues of the chest wall are grossly unremarkable. No suspicious osseous lesions. There are mild multilevel degenerative changes in the visualized spine. CT ABDOMEN PELVIS FINDINGS Hepatobiliary: The liver is normal in size. Non-cirrhotic configuration. No suspicious mass. These is mild diffuse hepatic steatosis. Subtle ill-defined hypoattenuating area in the right hepatic dome, segment 7 is too small to adequately characterize on the current exam and appears less pronounced than the prior study. No intrahepatic or extrahepatic bile duct dilation. No calcified gallstones. Subtle hyperattenuating thickening of the fundus of the gallbladder is not well evaluated in the CT scan exam but favored to represent focal fundal adenomyomatosis. The gallbladder is otherwise unremarkable. No pericholecystic inflammatory changes. Pancreas: Unremarkable. No pancreatic ductal  dilatation or surrounding inflammatory changes. Spleen: Within normal limits. No focal lesion. Adrenals/Urinary Tract: Adrenal glands are unremarkable. No suspicious renal mass. Redemonstration of at least 1 cyst,  in each kidneys. The cyst in the right kidney upper pole again exhibit small focus of calcification along the inferior aspect. No nephroureterolithiasis or obstructive uropathy. Unremarkable urinary bladder. Stomach/Bowel: There is a tiny diverticulum arising from the fundus of the stomach. There are also several diverticula arising from the duodenum. No disproportionate dilation of the small or large bowel loops. No evidence of abnormal bowel wall thickening or inflammatory changes. The appendix is unremarkable. There are multiple diverticula mainly in the sigmoid colon, without imaging signs of diverticulitis. Vascular/Lymphatic: No ascites or pneumoperitoneum. No abdominal or pelvic lymphadenopathy, by size criteria. No aneurysmal dilation of the major abdominal arteries. There are mild peripheral atherosclerotic vascular calcifications of the aorta and its major branches. Reproductive: Enlarged prostate. Symmetric seminal vesicles. Other: There is a tiny fat containing umbilical hernia. The soft tissues and abdominal wall are otherwise unremarkable. Musculoskeletal: No suspicious osseous lesions. There are mild multilevel degenerative changes in the visualized spine. There is a hemangioma in the L2 vertebral body. IMPRESSION: 1. No metastatic melanoma identified within the chest, abdomen or pelvis. 2. Multiple other nonacute observations, as described above. Aortic Atherosclerosis (ICD10-I70.0). Electronically Signed   By: Ree Molt M.D.   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  Tumor is BRAF mutation positive He is status post the following treatment: 1) status post wide excision with right axillary sentinel lymph node sampling under the care of Dr. Aron on April 01, 2019 and the final pathology  showed nodular melanoma with a pathological stage of 2C. 2) Status post skin graft on Apr 10, 2019 by Dr. Arelia 3) Status postcraniotomy and excision of brain tumor on August 05, 2020 by Dr. Debby. 4) Status post SRS following the craniotomy completed August 25, 2020 5) Status post treatment with 12 cycles of nivolumab  480 Mg IV every 4 weeks started September 22, 2020 and completed August 2022.  The patient has been in observation since that time and he is feeling fine. He is currently on observation.  The patient had repeat CT scan of the chest, abdomen and pelvis performed recently.  I personally independently reviewed the scan and discussed the results with the patient today.  His scan showed no concerning findings for disease progression. 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 He has been off active therapy since August 2022 and is under surveillance. The risk of radiation-induced malignancy from frequent CT imaging was discussed, but the benefit of early detection was considered to outweigh this risk. Financial concerns regarding imaging copayments were addressed, and surveillance was individualized accordingly. - Reviewed recent CT of chest, abdomen, and pelvis, which demonstrated no evidence of metastatic melanoma. - Discussed and agreed to extend surveillance CT interval to annually, given stable disease and financial considerations. - Advised to monitor for symptoms including chest pain, dyspnea, hemoptysis, lymphadenopathy, headache, visual changes, or new neurological deficits, and to contact the office for earlier imaging if these occur. - Scheduled follow-up visit and CT in one  year, with conditional plan for earlier imaging if symptoms develop. - Ongoing brain MRI surveillance every six months was discussed. He was  advised to call immediately if he has any other concerning symptoms in the interval. The patient voices understanding of current disease status and treatment options and is in agreement with the current care plan.  All questions were answered. The patient knows to call the clinic with any problems, questions or concerns. We can certainly see the patient much sooner if necessary. The total time spent in the appointment was 20 minutes.  Disclaimer: This note was dictated with voice recognition software. Similar sounding words can inadvertently be transcribed and may not be corrected upon review.        "

## 2025-01-14 ENCOUNTER — Other Ambulatory Visit

## 2025-01-18 ENCOUNTER — Inpatient Hospital Stay

## 2025-01-21 ENCOUNTER — Inpatient Hospital Stay: Attending: Internal Medicine | Admitting: Internal Medicine

## 2025-11-23 ENCOUNTER — Inpatient Hospital Stay

## 2025-11-23 ENCOUNTER — Ambulatory Visit (HOSPITAL_COMMUNITY)

## 2025-12-01 ENCOUNTER — Inpatient Hospital Stay: Admitting: Internal Medicine
